# Patient Record
Sex: Female | Born: 1955 | Race: White | Hispanic: No | Smoking: Current some day smoker
Health system: Southern US, Community
[De-identification: ages and names within clinical notes are randomized; demographics above are authoritative.]

## PROBLEM LIST (undated history)

## (undated) DIAGNOSIS — C50919 Malignant neoplasm of unspecified site of unspecified female breast: Secondary | ICD-10-CM

## (undated) DIAGNOSIS — E039 Hypothyroidism, unspecified: Secondary | ICD-10-CM

## (undated) DIAGNOSIS — J449 Chronic obstructive pulmonary disease, unspecified: Secondary | ICD-10-CM

## (undated) DIAGNOSIS — E7800 Pure hypercholesterolemia, unspecified: Secondary | ICD-10-CM

## (undated) DIAGNOSIS — K219 Gastro-esophageal reflux disease without esophagitis: Secondary | ICD-10-CM

## (undated) DIAGNOSIS — Z923 Personal history of irradiation: Secondary | ICD-10-CM

## (undated) DIAGNOSIS — I10 Essential (primary) hypertension: Secondary | ICD-10-CM

## (undated) HISTORY — PX: APPENDECTOMY: SHX54

## (undated) HISTORY — PX: MASTECTOMY, PARTIAL: SHX709

## (undated) HISTORY — PX: TUBAL LIGATION: SHX77

---

## 1998-05-14 ENCOUNTER — Ambulatory Visit (HOSPITAL_COMMUNITY): Admission: RE | Admit: 1998-05-14 | Discharge: 1998-05-14 | Payer: Self-pay | Admitting: Endocrinology

## 1998-05-22 ENCOUNTER — Ambulatory Visit (HOSPITAL_COMMUNITY): Admission: RE | Admit: 1998-05-22 | Discharge: 1998-05-22 | Payer: Self-pay | Admitting: Endocrinology

## 2000-02-03 ENCOUNTER — Ambulatory Visit (HOSPITAL_COMMUNITY): Admission: RE | Admit: 2000-02-03 | Discharge: 2000-02-03 | Payer: Self-pay | Admitting: Endocrinology

## 2000-02-03 ENCOUNTER — Encounter: Payer: Self-pay | Admitting: Endocrinology

## 2000-02-17 ENCOUNTER — Encounter: Payer: Self-pay | Admitting: Endocrinology

## 2000-02-17 ENCOUNTER — Ambulatory Visit (HOSPITAL_COMMUNITY): Admission: RE | Admit: 2000-02-17 | Discharge: 2000-02-17 | Payer: Self-pay | Admitting: Endocrinology

## 2000-02-27 ENCOUNTER — Inpatient Hospital Stay (HOSPITAL_COMMUNITY): Admission: EM | Admit: 2000-02-27 | Discharge: 2000-02-29 | Payer: Self-pay | Admitting: Emergency Medicine

## 2000-02-29 ENCOUNTER — Inpatient Hospital Stay (HOSPITAL_COMMUNITY): Admission: EM | Admit: 2000-02-29 | Discharge: 2000-03-02 | Payer: Self-pay | Admitting: *Deleted

## 2000-03-07 ENCOUNTER — Inpatient Hospital Stay (HOSPITAL_COMMUNITY): Admission: EM | Admit: 2000-03-07 | Discharge: 2000-03-09 | Payer: Self-pay | Admitting: *Deleted

## 2000-05-28 ENCOUNTER — Emergency Department (HOSPITAL_COMMUNITY): Admission: EM | Admit: 2000-05-28 | Discharge: 2000-05-28 | Payer: Self-pay | Admitting: *Deleted

## 2000-11-27 ENCOUNTER — Emergency Department (HOSPITAL_COMMUNITY): Admission: EM | Admit: 2000-11-27 | Discharge: 2000-11-27 | Payer: Self-pay | Admitting: Emergency Medicine

## 2001-05-05 ENCOUNTER — Emergency Department (HOSPITAL_COMMUNITY): Admission: EM | Admit: 2001-05-05 | Discharge: 2001-05-06 | Payer: Self-pay | Admitting: *Deleted

## 2002-05-15 ENCOUNTER — Encounter (INDEPENDENT_AMBULATORY_CARE_PROVIDER_SITE_OTHER): Payer: Self-pay | Admitting: Specialist

## 2002-05-15 ENCOUNTER — Inpatient Hospital Stay (HOSPITAL_COMMUNITY): Admission: EM | Admit: 2002-05-15 | Discharge: 2002-05-16 | Payer: Self-pay

## 2002-12-05 HISTORY — PX: BREAST LUMPECTOMY: SHX2

## 2003-04-17 ENCOUNTER — Other Ambulatory Visit: Admission: RE | Admit: 2003-04-17 | Discharge: 2003-04-17 | Payer: Self-pay | Admitting: Internal Medicine

## 2003-04-22 ENCOUNTER — Encounter: Payer: Self-pay | Admitting: Endocrinology

## 2003-04-22 ENCOUNTER — Ambulatory Visit (HOSPITAL_COMMUNITY): Admission: RE | Admit: 2003-04-22 | Discharge: 2003-04-22 | Payer: Self-pay | Admitting: Endocrinology

## 2003-04-24 ENCOUNTER — Encounter: Admission: RE | Admit: 2003-04-24 | Discharge: 2003-04-24 | Payer: Self-pay | Admitting: Endocrinology

## 2003-04-24 ENCOUNTER — Encounter: Payer: Self-pay | Admitting: Endocrinology

## 2003-05-19 ENCOUNTER — Ambulatory Visit (HOSPITAL_BASED_OUTPATIENT_CLINIC_OR_DEPARTMENT_OTHER): Admission: RE | Admit: 2003-05-19 | Discharge: 2003-05-19 | Payer: Self-pay | Admitting: General Surgery

## 2003-05-19 ENCOUNTER — Encounter: Payer: Self-pay | Admitting: General Surgery

## 2003-05-19 ENCOUNTER — Encounter (INDEPENDENT_AMBULATORY_CARE_PROVIDER_SITE_OTHER): Payer: Self-pay | Admitting: Specialist

## 2003-06-12 ENCOUNTER — Ambulatory Visit: Admission: RE | Admit: 2003-06-12 | Discharge: 2003-08-05 | Payer: Self-pay | Admitting: Radiation Oncology

## 2004-01-07 ENCOUNTER — Encounter: Admission: RE | Admit: 2004-01-07 | Discharge: 2004-01-07 | Payer: Self-pay | Admitting: General Surgery

## 2004-04-01 ENCOUNTER — Ambulatory Visit: Admission: RE | Admit: 2004-04-01 | Discharge: 2004-04-01 | Payer: Self-pay | Admitting: Radiation Oncology

## 2004-05-05 ENCOUNTER — Encounter: Admission: RE | Admit: 2004-05-05 | Discharge: 2004-05-05 | Payer: Self-pay | Admitting: General Surgery

## 2004-06-16 ENCOUNTER — Encounter: Admission: RE | Admit: 2004-06-16 | Discharge: 2004-06-16 | Payer: Self-pay | Admitting: Endocrinology

## 2004-09-21 ENCOUNTER — Encounter: Admission: RE | Admit: 2004-09-21 | Discharge: 2004-09-21 | Payer: Self-pay | Admitting: Endocrinology

## 2005-01-26 ENCOUNTER — Encounter: Admission: RE | Admit: 2005-01-26 | Discharge: 2005-01-26 | Payer: Self-pay | Admitting: Endocrinology

## 2005-05-13 ENCOUNTER — Encounter: Admission: RE | Admit: 2005-05-13 | Discharge: 2005-05-13 | Payer: Self-pay | Admitting: General Surgery

## 2006-01-06 ENCOUNTER — Other Ambulatory Visit: Admission: RE | Admit: 2006-01-06 | Discharge: 2006-01-06 | Payer: Self-pay | Admitting: Internal Medicine

## 2006-05-31 ENCOUNTER — Encounter: Admission: RE | Admit: 2006-05-31 | Discharge: 2006-05-31 | Payer: Self-pay | Admitting: General Surgery

## 2006-12-22 ENCOUNTER — Emergency Department (HOSPITAL_COMMUNITY): Admission: EM | Admit: 2006-12-22 | Discharge: 2006-12-22 | Payer: Self-pay | Admitting: Emergency Medicine

## 2007-02-01 ENCOUNTER — Other Ambulatory Visit: Admission: RE | Admit: 2007-02-01 | Discharge: 2007-02-01 | Payer: Self-pay | Admitting: Endocrinology

## 2007-05-24 ENCOUNTER — Emergency Department (HOSPITAL_COMMUNITY): Admission: EM | Admit: 2007-05-24 | Discharge: 2007-05-24 | Payer: Self-pay | Admitting: Emergency Medicine

## 2007-06-13 ENCOUNTER — Encounter: Admission: RE | Admit: 2007-06-13 | Discharge: 2007-06-13 | Payer: Self-pay | Admitting: Endocrinology

## 2008-08-21 ENCOUNTER — Encounter: Admission: RE | Admit: 2008-08-21 | Discharge: 2008-08-21 | Payer: Self-pay | Admitting: Endocrinology

## 2009-01-07 ENCOUNTER — Emergency Department (HOSPITAL_COMMUNITY): Admission: EM | Admit: 2009-01-07 | Discharge: 2009-01-07 | Payer: Self-pay | Admitting: Emergency Medicine

## 2009-05-14 ENCOUNTER — Other Ambulatory Visit: Admission: RE | Admit: 2009-05-14 | Discharge: 2009-05-14 | Payer: Self-pay | Admitting: Endocrinology

## 2009-05-29 ENCOUNTER — Emergency Department (HOSPITAL_COMMUNITY): Admission: EM | Admit: 2009-05-29 | Discharge: 2009-05-29 | Payer: Self-pay | Admitting: Emergency Medicine

## 2009-08-24 ENCOUNTER — Encounter: Admission: RE | Admit: 2009-08-24 | Discharge: 2009-08-24 | Payer: Self-pay | Admitting: Endocrinology

## 2009-08-31 ENCOUNTER — Emergency Department (HOSPITAL_COMMUNITY): Admission: EM | Admit: 2009-08-31 | Discharge: 2009-08-31 | Payer: Self-pay | Admitting: Emergency Medicine

## 2010-05-20 ENCOUNTER — Emergency Department (HOSPITAL_COMMUNITY): Admission: EM | Admit: 2010-05-20 | Discharge: 2010-05-21 | Payer: Self-pay | Admitting: Emergency Medicine

## 2010-10-29 ENCOUNTER — Encounter: Admission: RE | Admit: 2010-10-29 | Discharge: 2010-10-29 | Payer: Self-pay | Admitting: Endocrinology

## 2010-12-26 ENCOUNTER — Encounter: Payer: Self-pay | Admitting: Endocrinology

## 2011-03-11 LAB — URINALYSIS, ROUTINE W REFLEX MICROSCOPIC
Glucose, UA: NEGATIVE mg/dL
Nitrite: NEGATIVE
Protein, ur: >300 mg/dL — AB
Urobilinogen, UA: 1 mg/dL (ref 0.0–1.0)

## 2011-03-11 LAB — URINE MICROSCOPIC-ADD ON

## 2011-03-22 LAB — URINE CULTURE

## 2011-03-22 LAB — URINE MICROSCOPIC-ADD ON

## 2011-03-22 LAB — URINALYSIS, ROUTINE W REFLEX MICROSCOPIC
Bilirubin Urine: NEGATIVE
Specific Gravity, Urine: 1.005 (ref 1.005–1.030)
Urobilinogen, UA: 0.2 mg/dL (ref 0.0–1.0)

## 2011-04-22 NOTE — Op Note (Signed)
[place]  Patient:    Audrey Meyer, Audrey Meyer Visit Number: [id number] MRN: [id number]          Service Type: SUR Location: [id number] Attending Physician:  Vikki Ports. Dictated by:   Catalina Lunger, M.D. Proc. Date: 05/15/02 Admit Date:  05/15/2002 Discharge Date: 05/16/2002                             Operative Report  PREOPERATIVE DIAGNOSIS:  Acute appendicitis.  POSTOPERATIVE DIAGNOSIS:  Acute appendicitis.  PROCEDURE:  Laparoscopic appendectomy.  SURGEON:  Catalina Lunger, M.D.  ANESTHESIA:  General anesthesia.  DESCRIPTION OF PROCEDURE:  The patient was taken to the operating room and placed in the supine position.  After adequate anesthesia was induced using endotracheal tube, the abdomen was prepped and draped in the usual sterile fashion.  Using a supraumbilical small vertical incision, I dissected down to the fascia.  The fascia was opened vertically and the peritoneum was entered. An 0 Vicryl pursestring was placed around the fascial defect and Hasson trocar was placed in the abdomen.  The abdomen was insufflated with carbon dioxide. Under direct visualization a 5 mm port was placed in the right upper quadrant and a blunt 12 mm port was placed in the left suprapubic region.  The cecum was mobilized.  The appendix was identified medially.  It was quite inflammed, but not perforated.  There was some exudate and some free fluid in the cul-de-sac.  All of that was irrigated out.  The mesoappendix was taken down the Harmonic scalpel and the base of the appendix was transected with the laparoscopic GIA stapling device and then removed through the umbilical port. The right lower quadrant was then copiously irrigated.  The pneumoperitoneum was released.  The supraumbilical fascial defect was closed with 0 Vicryl pursestring suture and incisions were closed with subcuticular 4-0 Monocryl. Steri-Strips and sterile  dressings were applied.  The patient tolerated the procedure well and went to PACU In good condition. Dictated by:   Catalina Lunger, M.D. Attending Physician:  Danna Hefty R. DD:  05/15/02 TD:  05/17/02 Job: [id number]

## 2011-04-22 NOTE — Op Note (Signed)
   [name], Audrey Meyer                         ACCOUNT NO.:  [id number]   MEDICAL RECORD NO.:  [id number]                   PATIENT TYPE:  AMB   LOCATION:  DSC                                  FACILITY:  [facility]   PHYSICIAN:  Rose Phi. Maple Hudson, M.D.                DATE OF BIRTH:  1956-03-07   DATE OF PROCEDURE:  05/18/2003  DATE OF DISCHARGE:                                 OPERATIVE REPORT   PREOPERATIVE DIAGNOSIS:  Intraductal carcinoma of the left breast.   POSTOPERATIVE DIAGNOSIS:  Intraductal carcinoma of the left breast.   OPERATION:  Left partial mastectomy with needle localization and specimen  mammography.   SURGEON:  Rose Phi. Maple Hudson, M.D.   ANESTHESIA:  General.   OPERATIVE PROCEDURE:  This 55 year old married female had presented on a  screening mammogram with a new area of microcalcifications in the upper  inner quadrant of the left breast.  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  She is  scheduled for wide excision.   After suitable general anesthesia was induced, the patient was placed in the  supine position with the left arm extended on the arm board.  The left  breast was prepped and draped in the usual fashion.  A curved incision in  the upper inner quadrant was then outlined using the previously-placed wire  as a guide for where the calcifications were.  I then infiltrated it with  0.25% Marcaine.  An incision was made, and a wide excision of the wire and  surrounding tissue was carried out.  Hemostasis obtained was with the  cautery.   Specimen mammography confirmed the removal of the microcalcifications.  With  good hemostasis, subcuticular closure of 4-0 Monocryl and Steri-Strips  carried out.  Dressing applied.  The patient transferred to the recovery  room in satisfactory condition, having tolerated the procedure well.                                               Rose Phi. Maple Hudson, M.D.    PRY/MEDQ  D:  05/19/2003  T:  05/19/2003  Job:  [id number]   cc:   Brooke Bonito, M.D.  [address]  [place]  [place] [address]  Fax: [phone]

## 2011-04-22 NOTE — H&P (Signed)
[place]  Patient:    BEREA, MAJKOWSKI                 MRN: [id number] Adm. Date:  [id number] Attending:  Michiel Sites Dictator:   Janae Bridgeman Eloise Harman., M.D.                         History and Physical  HISTORY OF PRESENT ILLNESS:  This 55 year old married white female, resident of  [place], is under the psychiatric care of physicians at mental health clinic, specifically Dr. Hortencia Pilar.  At some time this past evening - it is certainly not clear, she took approximately 25 (500 mg) Tylenol P.M. tablets.  She presented o the emergency room at 0500 hours and was seen immediately by the EDP, Dr. Lorre Nick.  Patient would not elaborate on reasons for her attempt at suicide which apparently is the third attempt over the past several years.  Her  husband is here with her and speaks only a little bit of English but seems quite concerned about her welfare.  FAMILY HISTORY:  Mother has hyperthyroidism and had surgery for a goiter.  No other family illnesses are elaborated.  REVIEW OF SYSTEMS:  HEENT: She denies any significant headaches, hearing or visual problems.  No history of seizure disorder.  CARDIORESPIRATORY: Denies any chest  pain, shortness of breath, chronic cough, wheezing.  She does have some heart palpitations and does take Inderal 20 mg once or twice a day as needed. GASTROINTESTINAL: No current problems with abdominal pain.  She is currently quite nauseated from ingestion of Mucomyst given by the EDP for her overdose treatment. She does have a history of bleeding ulcers taking aspirin.  GENITOURINARY: Denies any frequency, dysuria, hematuria, or kidney stones.  MUSCULOSKELETAL: She does  have leg tightness and cramps in her calves and evidently uses Cogentin 1 mg at  bedtime to help with this.  No other arthritic or joint complaints. HEMATOLOGIC: No bleeding or bruising tendencies.  ENDOCRINOLOGIC: She  does have Graves disease and is under treatment by Dr. Juleen China, her primary care physician and endocrinologist.  She has received two doses of radioactive iodine, the latest ne just in the recent weeks.  She understands that this will ablate her thyroid and she will ultimately require supplements of thyroid hormones for the rest of her  life.  NEUROPSYCHIATRIC: She does have some ill-defined psychiatric disorder presumably depression.  She is on Paxil currently at 20 mg daily.  Previously, he was on 30 mg but did confide that she had sexual side effects specifically reduced libido and orgasmia.  GYNECOLOGIC: Patient thinks she may be going through menopause as she has had heavier periods that are lasting longer.  SOCIAL HISTORY:  Lives with her husband.  She has two children but they do not reside with her.  PHYSICAL EXAMINATION:  VITALS:  On presentation to the emergency room at 0442 hours, temperature 97.6,  blood pressure 148/92 in the left arm, pulse of 78, respirations are 20, O2 saturations of 99%.  Follow-up blood pressure at 0806 hours is 147/94, pulse of 84, respirations 22.  GENERAL:  This is a small-statured, slightly built, but well-developed, well-nourished, white female who is alert.  Her speech is perhaps slightly slurred at best.  She is very passive and does not volunteer much information.  She does become nauseated and has emesis of small quantity of her activated charcoal.  HEENT:  Pupils equal,  round, reactive to light.  Extraocular motions intact. No scleral icterus.  Oral cavity is unremarkable except her tongue is black from the activated charcoal.  There is no signs of trauma about the skull.  Her hair is ut rather short.  NECK:  Her neck shows slight fullness around the thyroid area but I cannot detect any nodules at this time and there is no tenderness.  LUNGS:  Her lungs are clear.  HEART:  Heart is regular rhythm, without murmurs, gallops,  or rubs.  ABDOMEN:  Her abdomen is soft.  There is no organomegaly or masses.  RECTAL:  Deferred.  EXTREMITIES:  Extremities show no cyanosis, clubbing, or edema.  Pulses are intact. Muscle bulk is normal.  She has good range of motion of her hips, knees, and shoulders.  MENTAL STATUS:  She is sad but cooperative and does not express any overt wish o harm herself at this time.  As stated her husband who enters the examination area is very passive himself and seems quite concerned about her welfare.  LABORATORY DATA:  Her urinalysis showed yellow clear urine.  Specific gravity 1.013, pH of 6.0, negative on dip stick.  Her urine drug screen showed no detection of usual substances.  Alcohol level was less than 10 mg/dl.  Her salicylate level was less than 4 mg/dl. Her acetaminophen level was markedly elevated at 171 mcg/ml and this exceeds the 150 mcg/ml at the four-hour mark which is the best estimate we can give for her  level.  Her CBC showed a white count of 8900, hemoglobin 11.2 gm%, hematocrit 34%, MCV f 72.9, platelet count of 278,000.  Her hepatic function panel was unremarkable with SGOT only 35, SGPT of 37, alkaline phosphatase of 94, total bilirubin of 0.5.  EKG:  There is a normal sinus rhythm with a rate of 71 beats per minute.  IMPRESSION: 1. Overdose of acetaminophen with diphenhydramine (Benadryl) of approximately    25 tablets from two to four hours prior to presentation. 2. History of psychiatric disorders - type unknown. 3. Graves disease with hyperthyroidism - currently under treatment and monitoring. 4. Perimenopausal.  PLAN:  Patient will be admitted to monitor care unit on 3300.  I have already discussed her problem with the behavioral science ACT representative and they will see her today.  She will receive a sequence of Mucomyst at 140 mg/kg for the initial dose - she has received this - and then 70 mg/kg in four hours for a total of 14  doses or until deemed unnecessary based on liver toxicity results.  Her condition at this time is very good.  She will be followed by her usual physician, Dr. Juleen China, on his return on Monday. DD:  02/27/00  TD:  02/27/00 Job: [id number]

## 2011-05-25 ENCOUNTER — Other Ambulatory Visit (HOSPITAL_COMMUNITY)
Admission: RE | Admit: 2011-05-25 | Discharge: 2011-05-25 | Disposition: A | Discharge status: Home / Self Care | Payer: Medicare Other | Source: Ambulatory Visit | Attending: Endocrinology | Admitting: Endocrinology

## 2011-05-25 ENCOUNTER — Other Ambulatory Visit: Payer: Self-pay | Admitting: Endocrinology

## 2011-05-25 DIAGNOSIS — Z124 Encounter for screening for malignant neoplasm of cervix: Secondary | ICD-10-CM | POA: Insufficient documentation

## 2011-09-21 LAB — POCT RAPID STREP A: Streptococcus, Group A Screen (Direct): NEGATIVE

## 2011-12-15 ENCOUNTER — Other Ambulatory Visit: Payer: Self-pay | Admitting: Endocrinology

## 2011-12-15 DIAGNOSIS — Z1231 Encounter for screening mammogram for malignant neoplasm of breast: Secondary | ICD-10-CM

## 2012-01-30 ENCOUNTER — Ambulatory Visit: Payer: Medicare Other

## 2012-02-02 ENCOUNTER — Ambulatory Visit
Admission: RE | Admit: 2012-02-02 | Discharge: 2012-02-02 | Disposition: A | Discharge status: Home / Self Care | Payer: Medicare Other | Source: Ambulatory Visit | Attending: Endocrinology | Admitting: Endocrinology

## 2012-02-02 DIAGNOSIS — Z1231 Encounter for screening mammogram for malignant neoplasm of breast: Secondary | ICD-10-CM

## 2012-03-21 ENCOUNTER — Encounter (HOSPITAL_COMMUNITY): Payer: Self-pay | Admitting: Emergency Medicine

## 2012-03-21 ENCOUNTER — Emergency Department (HOSPITAL_COMMUNITY)
Admission: EM | Admit: 2012-03-21 | Discharge: 2012-03-22 | Disposition: A | Discharge status: Home / Self Care | Payer: Medicare Other | Attending: Emergency Medicine | Admitting: Emergency Medicine

## 2012-03-21 DIAGNOSIS — J4489 Other specified chronic obstructive pulmonary disease: Secondary | ICD-10-CM | POA: Insufficient documentation

## 2012-03-21 DIAGNOSIS — Z853 Personal history of malignant neoplasm of breast: Secondary | ICD-10-CM | POA: Insufficient documentation

## 2012-03-21 DIAGNOSIS — R197 Diarrhea, unspecified: Secondary | ICD-10-CM | POA: Insufficient documentation

## 2012-03-21 DIAGNOSIS — Z79899 Other long term (current) drug therapy: Secondary | ICD-10-CM | POA: Insufficient documentation

## 2012-03-21 DIAGNOSIS — E876 Hypokalemia: Secondary | ICD-10-CM | POA: Insufficient documentation

## 2012-03-21 DIAGNOSIS — I10 Essential (primary) hypertension: Secondary | ICD-10-CM | POA: Insufficient documentation

## 2012-03-21 DIAGNOSIS — E039 Hypothyroidism, unspecified: Secondary | ICD-10-CM | POA: Insufficient documentation

## 2012-03-21 DIAGNOSIS — J449 Chronic obstructive pulmonary disease, unspecified: Secondary | ICD-10-CM | POA: Insufficient documentation

## 2012-03-21 DIAGNOSIS — E7800 Pure hypercholesterolemia, unspecified: Secondary | ICD-10-CM | POA: Insufficient documentation

## 2012-03-21 DIAGNOSIS — K529 Noninfective gastroenteritis and colitis, unspecified: Secondary | ICD-10-CM

## 2012-03-21 HISTORY — DX: Essential (primary) hypertension: I10

## 2012-03-21 HISTORY — DX: Chronic obstructive pulmonary disease, unspecified: J44.9

## 2012-03-21 HISTORY — DX: Gastro-esophageal reflux disease without esophagitis: K21.9

## 2012-03-21 HISTORY — DX: Malignant neoplasm of unspecified site of unspecified female breast: C50.919

## 2012-03-21 HISTORY — DX: Pure hypercholesterolemia, unspecified: E78.00

## 2012-03-21 HISTORY — DX: Hypothyroidism, unspecified: E03.9

## 2012-03-21 MED ORDER — SODIUM CHLORIDE 0.9 % IV BOLUS (SEPSIS)
1000.0000 mL | Freq: Once | INTRAVENOUS | Status: AC
  Administered 2012-03-22: 1000 mL via INTRAVENOUS

## 2012-03-21 NOTE — ED Notes (Signed)
Pt states she has had diarrhea for over 5 weeks  Pt states she was seen by Dr Candelaria Stagers at [facility] were done and it all came back ok  No stool testings have been done  Pt states everytime she eats a few minutes later she has to go to the bathroom and it is like water

## 2012-03-22 LAB — CBC
HCT: 40.1 % (ref 36.0–46.0)
Hemoglobin: 14.2 g/dL (ref 12.0–15.0)
MCH: 30.4 pg (ref 26.0–34.0)
MCHC: 35.4 g/dL (ref 30.0–36.0)
MCV: 85.9 fL (ref 78.0–100.0)

## 2012-03-22 LAB — BASIC METABOLIC PANEL
BUN: 5 mg/dL — ABNORMAL LOW (ref 6–23)
Calcium: 9.1 mg/dL (ref 8.4–10.5)
Creatinine, Ser: 0.64 mg/dL (ref 0.50–1.10)
GFR calc non Af Amer: >90 mL/min (ref >90)
Glucose, Bld: 130 mg/dL — ABNORMAL HIGH (ref 70–99)

## 2012-03-22 MED ORDER — POTASSIUM CHLORIDE CRYS ER 10 MEQ PO TBCR: 20.0000 meq | EXTENDED_RELEASE_TABLET | Freq: Two times a day (BID) | ORAL | Status: DC

## 2012-03-22 MED ORDER — POTASSIUM CHLORIDE 10 MEQ/100ML IV SOLN
10.0000 meq | Freq: Once | INTRAVENOUS | Status: AC
  Administered 2012-03-22: 10 meq via INTRAVENOUS
  Filled 2012-03-22: qty 100

## 2012-03-22 MED ORDER — POTASSIUM CHLORIDE CRYS ER 10 MEQ PO TBCR: 10.0000 meq | EXTENDED_RELEASE_TABLET | Freq: Two times a day (BID) | ORAL | Status: DC

## 2012-03-22 MED ORDER — POTASSIUM CHLORIDE CRYS ER 20 MEQ PO TBCR
40.0000 meq | EXTENDED_RELEASE_TABLET | Freq: Once | ORAL | Status: AC
  Administered 2012-03-22: 40 meq via ORAL
  Filled 2012-03-22: qty 2

## 2012-03-22 NOTE — ED Provider Notes (Signed)
History     CSN: [id number]  Arrival date & time 03/21/12  2201   First MD Initiated Contact with Patient 03/22/12 0234      Chief Complaint  Patient presents with  . Diarrhea    (Consider location/radiation/quality/duration/timing/severity/associated sxs/prior treatment) HPI Comments: Patient reports she has had diarrhea for 5 weeks.  The diarrhea is profuse, brown, and liquid.  States she has 3-4 stools when she wakes up in the morning, then 3-4 every time she eats.  Has been taking kaopectate, then immodium, and now a home remedy of lemon juice, corn starch, and coke, with only mild success.  She has been to her PCP Dr Juleen China and has had normal lab tests, including thyroid tests.  Denies recent antibiotic usage.  Denies fevers, N/V, abdominal pain, urinary or vaginal symptoms.  Denies hematochezia or melena.    Patient is a 56 y.o. female presenting with diarrhea. The history is provided by the patient.  Diarrhea The primary symptoms include diarrhea. Primary symptoms do not include fever, abdominal pain, vomiting or dysuria.    Past Medical History  Diagnosis Date  . Hypothyroid   . High cholesterol   . Acid reflux   . COPD (chronic obstructive pulmonary disease)   . Hypertension   . Schizoaffective disorder   . Breast cancer     Past Surgical History  Procedure Date  . Mastectomy, partial   . Appendectomy     Family History  Problem Relation Age of Onset  . Heart attack Father   . Cancer Other     History  Substance Use Topics  . Smoking status: Current Everyday Smoker -- 1.0 packs/day    Types: Cigarettes  . Smokeless tobacco: Not on file  . Alcohol Use: No    OB History    Grav Para Term Preterm Abortions TAB SAB Ect Mult Living                  Review of Systems  Constitutional: Negative for fever.  Respiratory: Negative for cough and shortness of breath.   Cardiovascular: Negative for chest pain.  Gastrointestinal: Positive for diarrhea. Negative  for vomiting and abdominal pain.  Genitourinary: Negative for dysuria, urgency, frequency, vaginal bleeding and vaginal discharge.  Neurological: Negative for weakness and numbness.  All other systems reviewed and are negative.    Allergies  Review of patient's allergies indicates no known allergies.  Home Medications   Current Outpatient Rx  Name Route Sig Dispense Refill  . ALBUTEROL SULFATE HFA 108 (90 BASE) MCG/ACT IN AERS Inhalation Inhale 2 puffs into the lungs every 6 (six) hours as needed.    . ATENOLOL-CHLORTHALIDONE 100-25 MG PO TABS Oral Take 1 tablet by mouth daily.    Marland Kitchen BENZTROPINE MESYLATE 1 MG PO TABS Oral Take 1 mg by mouth daily.    Marland Kitchen DM-GUAIFENESIN ER 30-600 MG PO TB12 Oral Take 1 tablet by mouth every 12 (twelve) hours as needed.    Marland Kitchen DIAZEPAM 5 MG PO TABS Oral Take 5 mg by mouth every 12 (twelve) hours as needed.     Marland Kitchen HYDROCODONE-ACETAMINOPHEN 5-325 MG PO TABS Oral Take 1 tablet by mouth every 6 (six) hours as needed.    Marland Kitchen LEVOTHYROXINE SODIUM 100 MCG PO TABS Oral Take 100 mcg by mouth daily.    Marland Kitchen LIOTHYRONINE SODIUM 25 MCG PO TABS Oral Take 12.5 mcg by mouth 2 (two) times daily.     Marland Kitchen LISINOPRIL 20 MG PO TABS Oral Take 20 mg  by mouth daily.    Marland Kitchen MELATONIN 3 MG PO TABS Oral Take 1 tablet by mouth daily. Hold while in hospital    . OMEPRAZOLE 20 MG PO CPDR Oral Take 20 mg by mouth daily.    Marland Kitchen PAROXETINE HCL ER 25 MG PO TB24 Oral Take 25 mg by mouth 2 (two) times daily.    Marland Kitchen POTASSIUM CHLORIDE CRYS ER 10 MEQ PO TBCR Oral Take 10 mEq by mouth 2 (two) times daily.     Marland Kitchen PRAVASTATIN SODIUM 20 MG PO TABS Oral Take 20 mg by mouth daily.    . THIOTHIXENE 5 MG PO CAPS Oral Take 5 mg by mouth daily.    . TRAZODONE HCL 100 MG PO TABS Oral Take 200 mg by mouth at bedtime.    Marland Kitchen TRIMETHOPRIM 100 MG PO TABS Oral Take 100 mg by mouth as needed.      BP 107/65  Pulse 67  Temp(Src) 98.4 F (36.9 C) (Oral)  Resp 16  SpO2 94%  Physical Exam  Nursing note and vitals  reviewed. Constitutional: She is oriented to person, place, and time. She appears well-developed and well-nourished. No distress.  HENT:  Head: Normocephalic and atraumatic.  Neck: Neck supple.  Cardiovascular: Normal rate, regular rhythm and normal heart sounds.   Pulmonary/Chest: Breath sounds normal. No respiratory distress. She has no wheezes. She has no rales. She exhibits no tenderness.  Abdominal: Soft. Bowel sounds are normal. She exhibits no distension and no mass. There is no tenderness. There is no rebound and no guarding.  Neurological: She is alert and oriented to person, place, and time.  Skin: She is not diaphoretic.  Psychiatric: She has a normal mood and affect. Her behavior is normal. Judgment and thought content normal.    ED Course  Procedures (including critical care time)  Labs Reviewed  BASIC METABOLIC PANEL - Abnormal; Notable for the following:    Potassium 2.6 (*)    Glucose, Bld 130 (*)    BUN 5 (*)    All other components within normal limits  CBC  CLOSTRIDIUM DIFFICILE BY PCR  STOOL CULTURE   No results found.  3:22 AM Patient has not yet had bowel movement while in ED.    Discussed patient with Dr Patria Mane.   4:47 AM Patient has been in the ED for 6 hours and 45 minutes and has not yet had a bowel movement.  Patient found to by hypokalemic, replaced in ED.  Pt states she takes potassium at home, likely not keeping up with the level of diarrhea.  I will add a short course to supplement.  I have asked patient to follow closely with Dr Juleen China and to do the stool samples if she is able.    1. Hypokalemia   2. Chronic diarrhea       MDM  Patient with chronic diarrhea x 5 weeks with no fever or abdominal pain found to by hyponatremic.  Abdominal exam is benign. Patient is being followed by Dr Juleen China (PCP) for this problem.  Potassium repleted in ED and additional prescription written for home.  Pt unable to produce stool in 7 hours, despite eating and  drinking.  Pt d/c home with close PCP follow up.  Patient verbalizes understanding and agrees with plan.          Dillard Cannon [place], [place] 03/22/12 [phone]

## 2012-03-22 NOTE — Discharge Instructions (Signed)
Please call Dr Juleen China for a close follow up appointment.  Please discuss having stool samples done and a recheck of your potassium.  If you develop fevers, abdominal pain, nausea and vomiting, blood in your stool, or severe dehydration, return to the ER immediately for a recheck.  You may return to the ER at any time for worsening condition or any new symptoms that concern you.  Chronic Diarrhea Diarrhea is loose, watery stools. Having diarrhea means passing loose stools 3 or more times a day. Diarrhea that lasts longer than 4 weeks is considered long-lasting (chronic). Symptoms of chronic diarrhea may be continual or may come and go. People of all ages can get diarrhea. Body fluid loss (dehydration) may occur as a result of diarrhea. This means the body does not have as many fluids and salts (electrolytes) as it needs. CAUSES  There are many causes of chronic diarrhea. Causes may be different for children and adults. The various causes can be grouped into 2 categories: diarrhea caused by an infection and diarrhea not caused by an infection. Sometimes, the cause is unknown. Diarrhea caused by an infection may result from:  Parasites.   Bacteria.   Viral infections.  Diarrhea not caused by an infection may result from:  Irritable bowel syndrome.   Reaction to medicines, such as antibiotics, cancer drugs, blood pressure medicines, and antacids.   Intestinal disease (Crohn's disease, ulcerative colitis, celiac disease).   Food allergies or sensitivity to additives (fructose, lactose, sugar substitutes).   Tumors.   Diabetes, thyroid disease, and other endocrine diseases.   Reduced blood flow to the intestine.   Previous surgery or radiation of the abdomen or gastrointestinal tract.  Risk factors for chronic diarrhea include:  Having a severely weakened immune system, such as from HIV/AIDS.   Taking certain types of cancer-fighting drugs (chemotherapy) or other medicines.   A recent  organ transplant.   Having a portion of the stomach removed.   Traveling to countries where food and water supplies are often contaminated.  SYMPTOMS  In addition to frequent, loose stools, diarrhea may cause:  Cramping.   Abdominal pain.   Nausea.   Urgent need to use the bathroom, or loss of bowel control.  If dehydration occurs, problems include:  Thirst.   Less frequent urination.   Dark urine.   Dry skin.   Fatigue.   Dizziness.  Infections that cause diarrhea may also cause a fever, chills, or bloody stools. DIAGNOSIS  Diagnosis may be difficult. Your caregiver must take a careful history and perform a physical exam. Tests given are based on your symptoms and history. Tests may include:  Blood or stool tests, in which 3 or more stool samples may be examined. Stool cultures may be used to test for bacteria or parasites.   X-rays.   A procedure in which a thin tube is inserted into the mouth or rectum (endoscopy). This allows the caregiver to look inside the intestine.  TREATMENT   Diarrhea caused by an infection can often be treated with antibiotics.   Diarrhea not caused by an infection is more difficult to diagnose and treat. Long-term medicine use or surgery may be required. Specific treatment should be discussed with your caregiver.   If the cause cannot determined, treatment to relieve symptoms includes:   Preventing dehydration. Serious health problems can occur if you do not maintain proper fluid levels. Many oral rehydration solutions (ORS) are available at drug stores. Ask your caregiver what product is best for  you.   Not drinking beverages that contain caffeine (tea, coffee, soft drinks).   Not drinking alcohol. It causes dehydration.   Not relying on sports drinks and broths alone to maintain proper fluid levels. They should not be used to prevent severe dehydration.   Maintaining well-balanced nutrition. This may help you recover faster.    PREVENTION   Drink clean or purified water.   Use proper food handling techniques.   Maintain proper hand-washing habits.  HOME CARE INSTRUCTIONS   Avoid:   Caffeine.   Greasy foods.   High fiber.   If you have problems digesting lactose during or after an episode of diarrhea, you might want to try yogurt. Yogurt is often better tolerated, because it has less lactose than milk. Yogurt with active, live bacterial cultures may even help you recover faster.  SEEK MEDICAL CARE IF:  The person with diarrhea is an otherwise healthy adult and has:  Signs of dehydration.   Diarrhea for more than 2 days.   Severe pain in the abdomen or rectum.   An oral temperature above 102 F (38.9 C).   Stools containing blood or pus.   Stools that are black and tarry.  SEEK IMMEDIATE MEDICAL CARE IF:  The person with diarrhea is a child, elderly person, or has a weakened immune system and has:  Signs of dehydration.   Diarrhea for more than 1 day.   Severe pain in the abdomen or rectum.   An oral temperature above 102 F (38.9 C), not controlled by medicine.   Stools containing blood or pus.   Stools that are black and tarry.  Document Released: 02/11/2004 Document Revised: 11/10/2011 Document Reviewed: 04/09/2010 [facility] Patient Information 2012 [place], [place].

## 2012-03-22 NOTE — ED Provider Notes (Signed)
Medical screening examination/treatment/procedure(s) were performed by non-physician practitioner and as supervising physician I was immediately available for consultation/collaboration.   Lyanne Co, MD 03/22/12 [phone]

## 2012-03-26 LAB — STOOL CULTURE

## 2012-09-19 ENCOUNTER — Emergency Department (HOSPITAL_COMMUNITY)
Admission: EM | Admit: 2012-09-19 | Discharge: 2012-09-20 | Disposition: A | Discharge status: Home / Self Care | Payer: Medicare Other | Attending: Emergency Medicine | Admitting: Emergency Medicine

## 2012-09-19 DIAGNOSIS — R109 Unspecified abdominal pain: Secondary | ICD-10-CM | POA: Insufficient documentation

## 2012-09-19 DIAGNOSIS — E7800 Pure hypercholesterolemia, unspecified: Secondary | ICD-10-CM | POA: Insufficient documentation

## 2012-09-19 DIAGNOSIS — K219 Gastro-esophageal reflux disease without esophagitis: Secondary | ICD-10-CM | POA: Insufficient documentation

## 2012-09-19 DIAGNOSIS — Z853 Personal history of malignant neoplasm of breast: Secondary | ICD-10-CM | POA: Insufficient documentation

## 2012-09-19 DIAGNOSIS — F17200 Nicotine dependence, unspecified, uncomplicated: Secondary | ICD-10-CM | POA: Insufficient documentation

## 2012-09-19 DIAGNOSIS — I10 Essential (primary) hypertension: Secondary | ICD-10-CM | POA: Insufficient documentation

## 2012-09-19 DIAGNOSIS — J4489 Other specified chronic obstructive pulmonary disease: Secondary | ICD-10-CM | POA: Insufficient documentation

## 2012-09-19 DIAGNOSIS — F259 Schizoaffective disorder, unspecified: Secondary | ICD-10-CM | POA: Insufficient documentation

## 2012-09-19 DIAGNOSIS — E039 Hypothyroidism, unspecified: Secondary | ICD-10-CM | POA: Insufficient documentation

## 2012-09-19 DIAGNOSIS — Z888 Allergy status to other drugs, medicaments and biological substances status: Secondary | ICD-10-CM | POA: Insufficient documentation

## 2012-09-19 DIAGNOSIS — J449 Chronic obstructive pulmonary disease, unspecified: Secondary | ICD-10-CM | POA: Insufficient documentation

## 2012-09-19 LAB — URINALYSIS, ROUTINE W REFLEX MICROSCOPIC
Glucose, UA: NEGATIVE mg/dL
Ketones, ur: NEGATIVE mg/dL
Leukocytes, UA: NEGATIVE
Nitrite: NEGATIVE
Specific Gravity, Urine: 1.015 (ref 1.005–1.030)
pH: 7.5 (ref 5.0–8.0)

## 2012-09-19 NOTE — ED Notes (Signed)
Pt states she took OTC laxative which helped. Pt states her last BM was today.

## 2012-09-19 NOTE — ED Notes (Signed)
Pt c/o "abdominal bloating and trapped gas". Pt states she had pain in her lower abd earlier but it has gone since. Pt denies n/v.

## 2012-09-19 NOTE — ED Notes (Signed)
Pt was dx with colitis September. Pt was seen at [facility] today Dr. Elnoria Howard. Pt was given Align probiotic. Pt complain of severe lower ABd pain last night. Pt took Mag citrate to help with BM. Pt had BM. Pt still feels abdominal distention like trapped gas. No n/v.

## 2012-09-20 ENCOUNTER — Encounter (HOSPITAL_COMMUNITY): Payer: Self-pay | Admitting: *Deleted

## 2012-09-20 LAB — BASIC METABOLIC PANEL
BUN: 6 mg/dL (ref 6–23)
CO2: 30 mEq/L (ref 19–32)
Chloride: 96 mEq/L (ref 96–112)
Creatinine, Ser: 0.66 mg/dL (ref 0.50–1.10)
Potassium: 3.8 mEq/L (ref 3.5–5.1)

## 2012-09-20 LAB — CBC
HCT: 39.8 % (ref 36.0–46.0)
MCV: 85.8 fL (ref 78.0–100.0)
RBC: 4.64 MIL/uL (ref 3.87–5.11)
WBC: 8.1 10*3/uL (ref 4.0–10.5)

## 2012-09-20 MED ORDER — DICYCLOMINE HCL 20 MG PO TABS: 20.0000 mg | ORAL_TABLET | Freq: Two times a day (BID) | ORAL | Status: DC

## 2012-09-20 NOTE — ED Provider Notes (Signed)
History     CSN: [id number]  Arrival date & time 09/19/12  2225   First MD Initiated Contact with Patient 09/20/12 0052      Chief Complaint  Patient presents with  . Abdominal Pain    (Consider location/radiation/quality/duration/timing/severity/associated sxs/prior treatment) HPI Comments: Patient reports that two days ago she began having generalized abdominal pain.  She describes the pain as a cramping pain.  She states that she felt "gassy"  She states that she was feeling constipated and took a laxative 2 days ago.  Yesterday she began having diarrhea.  No blood in her stool.  She denies nausea or vomiting.  She reports that she continues to have some mild generalized abdominal cramping pain, but it has improved.  No fever or chills.  She saw Dr. Elnoria Howard with GI this morning.  She states that he instructed her to take Probiotic.    Patient is a 56 y.o. female presenting with abdominal pain. The history is provided by the patient.  Abdominal Pain The primary symptoms of the illness include abdominal pain and diarrhea. The primary symptoms of the illness do not include fever, nausea, vomiting, dysuria or vaginal discharge.  Additional symptoms associated with the illness include constipation. Symptoms associated with the illness do not include chills, diaphoresis, urgency or frequency.    Past Medical History  Diagnosis Date  . Hypothyroid   . High cholesterol   . Acid reflux   . COPD (chronic obstructive pulmonary disease)   . Hypertension   . Schizoaffective disorder   . Breast cancer     Past Surgical History  Procedure Date  . Mastectomy, partial   . Appendectomy     Family History  Problem Relation Age of Onset  . Heart attack Father   . Cancer Other     History  Substance Use Topics  . Smoking status: Current Every Day Smoker -- 1.0 packs/day    Types: Cigarettes  . Smokeless tobacco: Not on file  . Alcohol Use: No    OB History    Grav Para Term Preterm  Abortions TAB SAB Ect Mult Living                  Review of Systems  Constitutional: Negative for fever, chills and diaphoresis.  Gastrointestinal: Positive for abdominal pain, diarrhea and constipation. Negative for nausea, vomiting, blood in stool, abdominal distention and anal bleeding.  Genitourinary: Negative for dysuria, urgency, frequency and vaginal discharge.    Allergies  Budesonide  Home Medications   Current Outpatient Rx  Name Route Sig Dispense Refill  . RISAQUAD PO CAPS Oral Take 1 capsule by mouth daily.    . ATENOLOL-CHLORTHALIDONE 100-25 MG PO TABS Oral Take 0.5 tablets by mouth daily.     Marland Kitchen BENZTROPINE MESYLATE 1 MG PO TABS Oral Take 1 mg by mouth daily.    Marland Kitchen DM-GUAIFENESIN ER 30-600 MG PO TB12 Oral Take 1 tablet by mouth every 12 (twelve) hours as needed.    Marland Kitchen DIAZEPAM 5 MG PO TABS Oral Take 5 mg by mouth every 12 (twelve) hours as needed.     Marland Kitchen ESOMEPRAZOLE MAGNESIUM 40 MG PO CPDR Oral Take 40 mg by mouth daily before breakfast.    . HYDRALAZINE HCL 50 MG PO TABS Oral Take 25 mg by mouth 2 (two) times daily.    Marland Kitchen HYDROCODONE-ACETAMINOPHEN 5-325 MG PO TABS Oral Take 1 tablet by mouth every 6 (six) hours as needed.    Marland Kitchen LEVOTHYROXINE SODIUM 100  MCG PO TABS Oral Take 100 mcg by mouth daily.    Marland Kitchen LIOTHYRONINE SODIUM 25 MCG PO TABS Oral Take 12.5 mcg by mouth 2 (two) times daily.     Marland Kitchen LISINOPRIL 20 MG PO TABS Oral Take 20 mg by mouth daily.    Marland Kitchen MELATONIN 3 MG PO TABS Oral Take 1 tablet by mouth daily. Hold while in hospital    . PAROXETINE HCL ER 25 MG PO TB24 Oral Take 50 mg by mouth daily.     Marland Kitchen POTASSIUM CHLORIDE CRYS ER 10 MEQ PO TBCR Oral Take 10 mEq by mouth 2 (two) times daily.     Marland Kitchen PRAVASTATIN SODIUM 20 MG PO TABS Oral Take 20 mg by mouth daily.    . THIOTHIXENE 5 MG PO CAPS Oral Take 5 mg by mouth daily.    . TRAZODONE HCL 100 MG PO TABS Oral Take 200 mg by mouth at bedtime.    . ALBUTEROL SULFATE HFA 108 (90 BASE) MCG/ACT IN AERS Inhalation Inhale 2  puffs into the lungs every 6 (six) hours as needed.    Marland Kitchen TRIMETHOPRIM 100 MG PO TABS Oral Take 100 mg by mouth as needed.      BP 134/78  Pulse 69  Temp 98.1 F (36.7 C) (Oral)  Resp 18  Ht 5\' 2"  (1.575 m)  Wt 135 lb (61.236 kg)  BMI 24.69 kg/m2  SpO2 97%  Physical Exam  Nursing note and vitals reviewed. Constitutional: She appears well-developed and well-nourished.  HENT:  Head: Normocephalic and atraumatic.  Mouth/Throat: Oropharynx is clear and moist.  Cardiovascular: Normal rate, regular rhythm and normal heart sounds.   Pulmonary/Chest: Effort normal and breath sounds normal. No respiratory distress. She has no wheezes. She has no rales.  Abdominal: Soft. Bowel sounds are normal. She exhibits no distension and no mass. There is no tenderness. There is no rebound and no guarding.  Neurological: She is alert.  Skin: Skin is warm and dry.  Psychiatric: She has a normal mood and affect.    ED Course  Procedures (including critical care time)  Labs Reviewed  CBC - Abnormal; Notable for the following:    MCHC 36.2 (*)     All other components within normal limits  BASIC METABOLIC PANEL - Abnormal; Notable for the following:    Sodium 133 (*)     Glucose, Bld 100 (*)     All other components within normal limits  URINALYSIS, ROUTINE W REFLEX MICROSCOPIC - Abnormal; Notable for the following:    APPearance CLOUDY (*)     All other components within normal limits   No results found.   No diagnosis found.    MDM  Patient presenting today with a chief complaint of cramping abdominal pain over the past 2-3 days.  No abdominal tenderness on exam.  Patient is afebrile.  No vomiting.  She has had two bowel movements today.  She saw Dr. Elnoria Howard with GI earlier today and was discharged home with Probiotic.  Labs unremarkable.  Therefore, do not feel that further work up is indicated.  Feel that patient can be discharged home.  Return precautions discussed.        Pascal Lux [place], PA-C 09/20/12 [phone]

## 2012-09-20 NOTE — ED Notes (Signed)
Patient given discharge instructions, information, prescriptions, and diet order. Patient states that they adequately understand discharge information given and to return to ED if symptoms return or worsen.     

## 2012-09-21 NOTE — ED Provider Notes (Signed)
Medical screening examination/treatment/procedure(s) were performed by non-physician practitioner and as supervising physician I was immediately available for consultation/collaboration.   Celene Kras, MD 09/21/12 [phone]

## 2012-10-15 ENCOUNTER — Emergency Department (HOSPITAL_COMMUNITY): Payer: Medicare Other

## 2012-10-15 ENCOUNTER — Encounter (HOSPITAL_COMMUNITY): Payer: Self-pay | Admitting: *Deleted

## 2012-10-15 ENCOUNTER — Emergency Department (HOSPITAL_COMMUNITY)
Admission: EM | Admit: 2012-10-15 | Discharge: 2012-10-15 | Disposition: A | Discharge status: Home / Self Care | Payer: Medicare Other | Attending: Emergency Medicine | Admitting: Emergency Medicine

## 2012-10-15 DIAGNOSIS — F259 Schizoaffective disorder, unspecified: Secondary | ICD-10-CM | POA: Insufficient documentation

## 2012-10-15 DIAGNOSIS — E7800 Pure hypercholesterolemia, unspecified: Secondary | ICD-10-CM | POA: Insufficient documentation

## 2012-10-15 DIAGNOSIS — J449 Chronic obstructive pulmonary disease, unspecified: Secondary | ICD-10-CM | POA: Insufficient documentation

## 2012-10-15 DIAGNOSIS — K219 Gastro-esophageal reflux disease without esophagitis: Secondary | ICD-10-CM | POA: Insufficient documentation

## 2012-10-15 DIAGNOSIS — J4489 Other specified chronic obstructive pulmonary disease: Secondary | ICD-10-CM | POA: Insufficient documentation

## 2012-10-15 DIAGNOSIS — E039 Hypothyroidism, unspecified: Secondary | ICD-10-CM | POA: Insufficient documentation

## 2012-10-15 DIAGNOSIS — R1310 Dysphagia, unspecified: Secondary | ICD-10-CM | POA: Insufficient documentation

## 2012-10-15 DIAGNOSIS — F17200 Nicotine dependence, unspecified, uncomplicated: Secondary | ICD-10-CM | POA: Insufficient documentation

## 2012-10-15 DIAGNOSIS — Z853 Personal history of malignant neoplasm of breast: Secondary | ICD-10-CM | POA: Insufficient documentation

## 2012-10-15 DIAGNOSIS — I10 Essential (primary) hypertension: Secondary | ICD-10-CM | POA: Insufficient documentation

## 2012-10-15 NOTE — ED Notes (Signed)
Pt c/o ongoing problem w/ difficulty swallowing, worsening this past year. Pt states dysphagia worse when lying down and it was irritating her more than usual.

## 2012-10-15 NOTE — ED Notes (Addendum)
Pt states that she is having trouble swallowing. Water given. No coughing or choking noted. Coke given through a straw. No coughing or choking noted. Pt stated that she is doing fine with liquids. Cracker given. No coughing or choking noted.

## 2012-10-15 NOTE — ED Provider Notes (Signed)
History     CSN: [id number]  Arrival date & time 10/15/12  7829   First MD Initiated Contact with Patient 10/15/12 0455      Chief Complaint  Patient presents with  . Dysphagia    (Consider location/radiation/quality/duration/timing/severity/associated sxs/prior treatment) HPI Hx per PT, trouble swallowing solids and pills. This has been going on for some time but seems like it is getting worse, especially with swallowing her potassium pills, no trouble swallowing liquids, has seen Dr Elnoria Howard in the past with endoscopy but has never required dilation. No CP or SOB, no recent illness. No F/C. Moderate in severity. No known alleviating factors, is prescribed nexium for reflux.   Past Medical History  Diagnosis Date  . Hypothyroid   . High cholesterol   . Acid reflux   . COPD (chronic obstructive pulmonary disease)   . Hypertension   . Schizoaffective disorder   . Breast cancer     Past Surgical History  Procedure Date  . Mastectomy, partial   . Appendectomy     Family History  Problem Relation Age of Onset  . Heart attack Father   . Cancer Other     History  Substance Use Topics  . Smoking status: Current Every Day Smoker -- 1.0 packs/day    Types: Cigarettes  . Smokeless tobacco: Not on file  . Alcohol Use: No    OB History    Grav Para Term Preterm Abortions TAB SAB Ect Mult Living                  Review of Systems  Constitutional: Negative for fever and chills.  HENT: Negative for neck pain and neck stiffness.   Eyes: Negative for pain.  Respiratory: Negative for shortness of breath.   Cardiovascular: Negative for chest pain.  Gastrointestinal: Negative for abdominal pain.  Genitourinary: Negative for dysuria.  Musculoskeletal: Negative for back pain.  Skin: Negative for rash.  Neurological: Negative for headaches.  All other systems reviewed and are negative.    Allergies  Budesonide  Home Medications   Current Outpatient Rx  Name  Route   Sig  Dispense  Refill  . RISAQUAD PO CAPS   Oral   Take 1 capsule by mouth daily.         . ALBUTEROL SULFATE HFA 108 (90 BASE) MCG/ACT IN AERS   Inhalation   Inhale 2 puffs into the lungs every 6 (six) hours as needed. For shortness of breath         . ATENOLOL-CHLORTHALIDONE 100-25 MG PO TABS   Oral   Take 0.5 tablets by mouth 2 (two) times daily.          Marland Kitchen BENZTROPINE MESYLATE 1 MG PO TABS   Oral   Take 1 mg by mouth daily.         Marland Kitchen DM-GUAIFENESIN ER 30-600 MG PO TB12   Oral   Take 1 tablet by mouth every 12 (twelve) hours as needed. For cough & congestion         . DIAZEPAM 5 MG PO TABS   Oral   Take 5 mg by mouth every 12 (twelve) hours as needed. For anxiety         . DICYCLOMINE HCL 20 MG PO TABS   Oral   Take 1 tablet (20 mg total) by mouth 2 (two) times daily.   20 tablet   0   . ESOMEPRAZOLE MAGNESIUM 40 MG PO CPDR   Oral   Take  40 mg by mouth daily before breakfast.         . HYDRALAZINE HCL 50 MG PO TABS   Oral   Take 25 mg by mouth 2 (two) times daily.         Marland Kitchen HYDROCODONE-ACETAMINOPHEN 5-325 MG PO TABS   Oral   Take 1 tablet by mouth every 6 (six) hours as needed. For pain         . LEVOTHYROXINE SODIUM 100 MCG PO TABS   Oral   Take 100 mcg by mouth daily.         Marland Kitchen LIOTHYRONINE SODIUM 25 MCG PO TABS   Oral   Take 12.5 mcg by mouth 2 (two) times daily.          Marland Kitchen LISINOPRIL 20 MG PO TABS   Oral   Take 20 mg by mouth daily.         Marland Kitchen MELATONIN 3 MG PO TABS   Oral   Take 1 tablet by mouth daily as needed. For sleep         . PAROXETINE HCL ER 25 MG PO TB24   Oral   Take 50 mg by mouth daily.          Marland Kitchen POTASSIUM CHLORIDE CRYS ER 10 MEQ PO TBCR   Oral   Take 10 mEq by mouth 2 (two) times daily.          Marland Kitchen PRAVASTATIN SODIUM 20 MG PO TABS   Oral   Take 20 mg by mouth daily.         . THIOTHIXENE 5 MG PO CAPS   Oral   Take 5 mg by mouth daily.         . TRAZODONE HCL 100 MG PO TABS   Oral   Take  200 mg by mouth at bedtime. scheduled         . TRIMETHOPRIM 100 MG PO TABS   Oral   Take 100 mg by mouth daily as needed. For urinary tract infections. Patient states that she takes one tablet 30 minutes prior to relations.           BP 135/73  Pulse 69  Temp 98.3 F (36.8 C) (Oral)  Resp 19  Ht 5\' 2"  (1.575 m)  Wt 128 lb (58.06 kg)  BMI 23.41 kg/m2  SpO2 95%  Physical Exam  Nursing note and vitals reviewed. Constitutional: She is oriented to person, place, and time. She appears well-developed and well-nourished.  HENT:  Head: Normocephalic and atraumatic.  Eyes: EOM are normal. Pupils are equal, round, and reactive to light.  Neck: Neck supple.  Cardiovascular: Normal rate, normal heart sounds and intact distal pulses.   Pulmonary/Chest: Effort normal and breath sounds normal. No stridor. No respiratory distress. She has no wheezes.  Abdominal: Soft. Bowel sounds are normal. She exhibits no distension. There is no tenderness.  Musculoskeletal: Normal range of motion. She exhibits no edema.  Lymphadenopathy:    She has no cervical adenopathy.  Neurological: She is alert and oriented to person, place, and time.  Skin: Skin is warm and dry.    ED Course  Procedures (including critical care time)  Results for orders placed during the hospital encounter of 09/19/12  CBC      Component Value Range   WBC 8.1  4.0 - 10.5 K/uL   RBC 4.64  3.87 - 5.11 MIL/uL   Hemoglobin 14.4  12.0 - 15.0 g/dL   HCT 16.1  09.6 -  46.0 %   MCV 85.8  78.0 - 100.0 fL   MCH 31.0  26.0 - 34.0 pg   MCHC 36.2 (*) 30.0 - 36.0 g/dL   RDW 16.1  09.6 - 04.5 %   Platelets 215  150 - 400 K/uL  BASIC METABOLIC PANEL      Component Value Range   Sodium 133 (*) 135 - 145 mEq/L   Potassium 3.8  3.5 - 5.1 mEq/L   Chloride 96  96 - 112 mEq/L   CO2 30  19 - 32 mEq/L   Glucose, Bld 100 (*) 70 - 99 mg/dL   BUN 6  6 - 23 mg/dL   Creatinine, Ser 4.09  0.50 - 1.10 mg/dL   Calcium 9.5  8.4 - 81.1 mg/dL    GFR calc non Af Amer >90  >90 mL/min   GFR calc Af Amer >90  >90 mL/min  URINALYSIS, ROUTINE W REFLEX MICROSCOPIC      Component Value Range   Color, Urine YELLOW  YELLOW   APPearance CLOUDY (*) CLEAR   Specific Gravity, Urine 1.015  1.005 - 1.030   pH 7.5  5.0 - 8.0   Glucose, UA NEGATIVE  NEGATIVE mg/dL   Hgb urine dipstick NEGATIVE  NEGATIVE   Bilirubin Urine NEGATIVE  NEGATIVE   Ketones, ur NEGATIVE  NEGATIVE mg/dL   Protein, ur NEGATIVE  NEGATIVE mg/dL   Urobilinogen, UA 0.2  0.0 - 1.0 mg/dL   Nitrite NEGATIVE  NEGATIVE   Leukocytes, UA NEGATIVE  NEGATIVE   Dg Chest 2 View  10/15/2012  *RADIOLOGY REPORT*  Clinical Data: Difficulty swallowing.  CHEST - 2 VIEW  Comparison: 05/29/2012  Findings: Mild hyperinflation. Heart and mediastinal contours are within normal limits.  No focal opacities or effusions.  No acute bony abnormality.  IMPRESSION: No active cardiopulmonary disease.   Original Report Authenticated By: Charlett Nose, M.D.     Tolerates PO fluids  5:10 AM d/w [place] [facility] on call for DR Elnoria Howard this weekend, DR Russella Dar recommends that if she can tolerate liquids, that she can be discharged home, stay on liquid diet and call GI office today for close follow up in clinic today.  MDM  Dysphagia able to tolerate liquids. GI consult. VS and Nursing notes reviewed. Plan close outpatient follow up.        Sunnie Nielsen, MD 10/15/12 [phone]

## 2013-03-05 ENCOUNTER — Other Ambulatory Visit: Payer: Self-pay

## 2013-03-05 DIAGNOSIS — Z1231 Encounter for screening mammogram for malignant neoplasm of breast: Secondary | ICD-10-CM

## 2013-04-10 ENCOUNTER — Inpatient Hospital Stay: Admission: RE | Admit: 2013-04-10 | Payer: Medicare Other | Source: Ambulatory Visit

## 2013-05-15 ENCOUNTER — Ambulatory Visit: Payer: Medicare Other

## 2013-05-15 ENCOUNTER — Ambulatory Visit
Admission: RE | Admit: 2013-05-15 | Discharge: 2013-05-15 | Disposition: A | Discharge status: Home / Self Care | Payer: Medicare Other | Source: Ambulatory Visit

## 2013-05-15 DIAGNOSIS — Z1231 Encounter for screening mammogram for malignant neoplasm of breast: Secondary | ICD-10-CM

## 2013-10-12 ENCOUNTER — Emergency Department (HOSPITAL_COMMUNITY)
Admission: EM | Admit: 2013-10-12 | Discharge: 2013-10-12 | Disposition: A | Discharge status: Home / Self Care | Payer: Medicare Other | Attending: Emergency Medicine | Admitting: Emergency Medicine

## 2013-10-12 ENCOUNTER — Encounter (HOSPITAL_COMMUNITY): Payer: Self-pay | Admitting: Emergency Medicine

## 2013-10-12 DIAGNOSIS — E7800 Pure hypercholesterolemia, unspecified: Secondary | ICD-10-CM | POA: Insufficient documentation

## 2013-10-12 DIAGNOSIS — Z760 Encounter for issue of repeat prescription: Secondary | ICD-10-CM | POA: Insufficient documentation

## 2013-10-12 DIAGNOSIS — E039 Hypothyroidism, unspecified: Secondary | ICD-10-CM | POA: Insufficient documentation

## 2013-10-12 DIAGNOSIS — J4489 Other specified chronic obstructive pulmonary disease: Secondary | ICD-10-CM | POA: Insufficient documentation

## 2013-10-12 DIAGNOSIS — Z79899 Other long term (current) drug therapy: Secondary | ICD-10-CM | POA: Insufficient documentation

## 2013-10-12 DIAGNOSIS — F259 Schizoaffective disorder, unspecified: Secondary | ICD-10-CM | POA: Insufficient documentation

## 2013-10-12 DIAGNOSIS — F17200 Nicotine dependence, unspecified, uncomplicated: Secondary | ICD-10-CM | POA: Insufficient documentation

## 2013-10-12 DIAGNOSIS — I10 Essential (primary) hypertension: Secondary | ICD-10-CM | POA: Insufficient documentation

## 2013-10-12 DIAGNOSIS — Z853 Personal history of malignant neoplasm of breast: Secondary | ICD-10-CM | POA: Insufficient documentation

## 2013-10-12 DIAGNOSIS — K219 Gastro-esophageal reflux disease without esophagitis: Secondary | ICD-10-CM | POA: Insufficient documentation

## 2013-10-12 DIAGNOSIS — J449 Chronic obstructive pulmonary disease, unspecified: Secondary | ICD-10-CM | POA: Insufficient documentation

## 2013-10-12 MED ORDER — TRAZODONE HCL 100 MG PO TABS: 200.0000 mg | ORAL_TABLET | Freq: Every day | ORAL | Status: DC

## 2013-10-12 NOTE — ED Provider Notes (Signed)
CSN: [id number]     Arrival date & time 10/12/13  0143 History   First MD Initiated Contact with Patient 10/12/13 0355     Chief Complaint  Patient presents with  . Medication Refill   HPI  History provided by the patient. Patient is a 57 year old female with history of schizoaffective disorder, hypertension, hypothyroidism and COPD who presents with request for medication refill. Patient reports that she is currently being followed and treated for her schizoaffective disorder [facility]. She recently had a physician change in her last prescription for trazodone was filled on October 1. She has since run out of her medication and normally takes this at night. She has called [facility] but was told she cannot have a new prescription until her new appointment with a new physician on the 18th. She is requesting a temporary prescription for trazodone until that time. She has no other complaints. Denies any 6 depression, SI or HI. Denies any hallucinations.     Past Medical History  Diagnosis Date  . Hypothyroid   . High cholesterol   . Acid reflux   . COPD (chronic obstructive pulmonary disease)   . Hypertension   . Schizoaffective disorder   . Breast cancer    Past Surgical History  Procedure Laterality Date  . Mastectomy, partial    . Appendectomy     Family History  Problem Relation Age of Onset  . Heart attack Father   . Cancer Other    History  Substance Use Topics  . Smoking status: Current Every Day Smoker -- 1.00 packs/day    Types: Cigarettes  . Smokeless tobacco: Not on file  . Alcohol Use: No   OB History   Grav Para Term Preterm Abortions TAB SAB Ect Mult Living                 Review of Systems  All other systems reviewed and are negative.    Allergies  Budesonide  Home Medications   Current Outpatient Rx  Name  Route  Sig  Dispense  Refill  . albuterol (PROVENTIL HFA;VENTOLIN HFA) 108 (90 BASE) MCG/ACT inhaler   Inhalation   Inhale 2 puffs into the lungs  every 6 (six) hours as needed. For shortness of breath         . atenolol-chlorthalidone (TENORETIC) 100-25 MG per tablet   Oral   Take 0.5 tablets by mouth 2 (two) times daily.          . benztropine (COGENTIN) 0.5 MG tablet   Oral   Take 0.5 mg by mouth daily.         Marland Kitchen dextromethorphan-guaiFENesin (MUCINEX DM) 30-600 MG per 12 hr tablet   Oral   Take 1 tablet by mouth every 12 (twelve) hours as needed. For cough & congestion         . diazepam (VALIUM) 5 MG tablet   Oral   Take 5 mg by mouth every 12 (twelve) hours as needed. For anxiety         . esomeprazole (NEXIUM) 40 MG capsule   Oral   Take 40 mg by mouth daily before breakfast.         . HYDROcodone-acetaminophen (NORCO) 5-325 MG per tablet   Oral   Take 1 tablet by mouth every 6 (six) hours as needed. For pain         . levothyroxine (SYNTHROID, LEVOTHROID) 100 MCG tablet   Oral   Take 100 mcg by mouth daily.         Marland Kitchen  liothyronine (CYTOMEL) 25 MCG tablet   Oral   Take 12.5 mcg by mouth 2 (two) times daily.          Marland Kitchen lisinopril (PRINIVIL,ZESTRIL) 20 MG tablet   Oral   Take 20 mg by mouth daily.         . Melatonin 3 MG TABS   Oral   Take 1 tablet by mouth daily as needed. For sleep         . PARoxetine (PAXIL-CR) 25 MG 24 hr tablet   Oral   Take 50 mg by mouth daily.          . potassium chloride (K-DUR,KLOR-CON) 10 MEQ tablet   Oral   Take 10 mEq by mouth 2 (two) times daily.          . pravastatin (PRAVACHOL) 20 MG tablet   Oral   Take 20 mg by mouth daily.         Marland Kitchen thiothixene (NAVANE) 2 MG capsule   Oral   Take 2 mg by mouth as needed (takes with navane 5mg  if needed).         . thiothixene (NAVANE) 5 MG capsule   Oral   Take 5 mg by mouth daily.         . traZODone (DESYREL) 100 MG tablet   Oral   Take 200 mg by mouth at bedtime. scheduled         . traZODone (DESYREL) 100 MG tablet   Oral   Take 2 tablets (200 mg total) by mouth at bedtime.   24  tablet   0    BP 152/74  Pulse 62  Temp(Src) 98.1 F (36.7 C) (Oral)  Resp 16  SpO2 100% Physical Exam  Nursing note and vitals reviewed. Constitutional: She is oriented to person, place, and time. She appears well-developed and well-nourished. No distress.  HENT:  Head: Normocephalic and atraumatic.  Eyes: Conjunctivae and EOM are normal.  Cardiovascular: Normal rate and regular rhythm.   Pulmonary/Chest: Effort normal and breath sounds normal. No respiratory distress. She has no wheezes. She has no rales.  Musculoskeletal: Normal range of motion.  Neurological: She is alert and oriented to person, place, and time.  Skin: Skin is warm and dry. No rash noted.  Psychiatric: She has a normal mood and affect. Her behavior is normal.    ED Course  Procedures   Patient appears well no acute distress. She has requests for prescription refill. No other medical complaints. Vital signs unremarkable. Have agreed to give a temporary prescription for trazodone to last until 18th at her next appointment. Patient has her regular medications and is taking them regularly.   MDM   1. Medication refill        Angus Seller, PA-C 10/12/13 1957

## 2013-10-12 NOTE — ED Notes (Signed)
Pt arrived to Ed with a need for a medication refill.  Pt seen by [facility] for mental [facility] and has a prescription for trazodone.  Pt has been unable to refill prescription and [facility] is not able to give her a prescription this evening.

## 2013-10-13 NOTE — ED Provider Notes (Signed)
Medical screening examination/treatment/procedure(s) were performed by non-physician practitioner and as supervising physician I was immediately available for consultation/collaboration.   Khamille Beynon, MD 10/13/13 0705 

## 2014-10-10 ENCOUNTER — Other Ambulatory Visit: Payer: Self-pay

## 2014-10-10 DIAGNOSIS — Z1231 Encounter for screening mammogram for malignant neoplasm of breast: Secondary | ICD-10-CM

## 2014-10-29 ENCOUNTER — Ambulatory Visit
Admission: RE | Admit: 2014-10-29 | Discharge: 2014-10-29 | Disposition: A | Discharge status: Home / Self Care | Payer: Medicare Other | Source: Ambulatory Visit

## 2014-10-29 DIAGNOSIS — Z1231 Encounter for screening mammogram for malignant neoplasm of breast: Secondary | ICD-10-CM

## 2015-09-25 ENCOUNTER — Other Ambulatory Visit: Payer: Self-pay

## 2015-09-25 DIAGNOSIS — Z1231 Encounter for screening mammogram for malignant neoplasm of breast: Secondary | ICD-10-CM

## 2015-11-04 ENCOUNTER — Ambulatory Visit
Admission: RE | Admit: 2015-11-04 | Discharge: 2015-11-04 | Disposition: A | Discharge status: Home / Self Care | Payer: Medicare Other | Source: Ambulatory Visit

## 2015-11-04 DIAGNOSIS — Z1231 Encounter for screening mammogram for malignant neoplasm of breast: Secondary | ICD-10-CM

## 2016-03-06 ENCOUNTER — Emergency Department (HOSPITAL_COMMUNITY): Payer: Medicare Other

## 2016-03-06 ENCOUNTER — Encounter (HOSPITAL_COMMUNITY): Payer: Self-pay | Admitting: Emergency Medicine

## 2016-03-06 ENCOUNTER — Encounter (HOSPITAL_COMMUNITY): Payer: Self-pay | Admitting: *Deleted

## 2016-03-06 ENCOUNTER — Inpatient Hospital Stay (HOSPITAL_COMMUNITY)
Admission: EM | Admit: 2016-03-06 | Discharge: 2016-03-14 | DRG: 917 | Disposition: A | Discharge status: Home / Self Care | Payer: Medicare Other | Attending: Internal Medicine | Admitting: Internal Medicine

## 2016-03-06 ENCOUNTER — Emergency Department (HOSPITAL_COMMUNITY)
Admission: EM | Admit: 2016-03-06 | Discharge: 2016-03-06 | Discharge status: Against Medical Advice | Payer: Medicare Other | Source: Home / Self Care | Attending: Emergency Medicine | Admitting: Emergency Medicine

## 2016-03-06 DIAGNOSIS — E7800 Pure hypercholesterolemia, unspecified: Secondary | ICD-10-CM

## 2016-03-06 DIAGNOSIS — Z79899 Other long term (current) drug therapy: Secondary | ICD-10-CM | POA: Diagnosis not present

## 2016-03-06 DIAGNOSIS — E039 Hypothyroidism, unspecified: Secondary | ICD-10-CM | POA: Insufficient documentation

## 2016-03-06 DIAGNOSIS — J440 Chronic obstructive pulmonary disease with acute lower respiratory infection: Secondary | ICD-10-CM | POA: Diagnosis present

## 2016-03-06 DIAGNOSIS — Z853 Personal history of malignant neoplasm of breast: Secondary | ICD-10-CM

## 2016-03-06 DIAGNOSIS — F258 Other schizoaffective disorders: Secondary | ICD-10-CM | POA: Diagnosis present

## 2016-03-06 DIAGNOSIS — Z8659 Personal history of other mental and behavioral disorders: Secondary | ICD-10-CM | POA: Insufficient documentation

## 2016-03-06 DIAGNOSIS — F251 Schizoaffective disorder, depressive type: Secondary | ICD-10-CM | POA: Diagnosis present

## 2016-03-06 DIAGNOSIS — R0902 Hypoxemia: Secondary | ICD-10-CM | POA: Diagnosis present

## 2016-03-06 DIAGNOSIS — Z23 Encounter for immunization: Secondary | ICD-10-CM

## 2016-03-06 DIAGNOSIS — Z638 Other specified problems related to primary support group: Secondary | ICD-10-CM | POA: Diagnosis not present

## 2016-03-06 DIAGNOSIS — T50901A Poisoning by unspecified drugs, medicaments and biological substances, accidental (unintentional), initial encounter: Secondary | ICD-10-CM | POA: Diagnosis present

## 2016-03-06 DIAGNOSIS — K529 Noninfective gastroenteritis and colitis, unspecified: Secondary | ICD-10-CM | POA: Diagnosis present

## 2016-03-06 DIAGNOSIS — Z915 Personal history of self-harm: Secondary | ICD-10-CM

## 2016-03-06 DIAGNOSIS — J449 Chronic obstructive pulmonary disease, unspecified: Secondary | ICD-10-CM | POA: Insufficient documentation

## 2016-03-06 DIAGNOSIS — J441 Chronic obstructive pulmonary disease with (acute) exacerbation: Secondary | ICD-10-CM | POA: Diagnosis present

## 2016-03-06 DIAGNOSIS — T43212A Poisoning by selective serotonin and norepinephrine reuptake inhibitors, intentional self-harm, initial encounter: Principal | ICD-10-CM | POA: Diagnosis present

## 2016-03-06 DIAGNOSIS — F419 Anxiety disorder, unspecified: Secondary | ICD-10-CM | POA: Diagnosis present

## 2016-03-06 DIAGNOSIS — F338 Other recurrent depressive disorders: Secondary | ICD-10-CM | POA: Diagnosis present

## 2016-03-06 DIAGNOSIS — R197 Diarrhea, unspecified: Secondary | ICD-10-CM

## 2016-03-06 DIAGNOSIS — Z888 Allergy status to other drugs, medicaments and biological substances status: Secondary | ICD-10-CM

## 2016-03-06 DIAGNOSIS — G4700 Insomnia, unspecified: Secondary | ICD-10-CM | POA: Diagnosis present

## 2016-03-06 DIAGNOSIS — F17210 Nicotine dependence, cigarettes, uncomplicated: Secondary | ICD-10-CM

## 2016-03-06 DIAGNOSIS — T43212S Poisoning by selective serotonin and norepinephrine reuptake inhibitors, intentional self-harm, sequela: Secondary | ICD-10-CM | POA: Diagnosis not present

## 2016-03-06 DIAGNOSIS — K219 Gastro-esophageal reflux disease without esophagitis: Secondary | ICD-10-CM | POA: Insufficient documentation

## 2016-03-06 DIAGNOSIS — I10 Essential (primary) hypertension: Secondary | ICD-10-CM | POA: Diagnosis present

## 2016-03-06 DIAGNOSIS — E785 Hyperlipidemia, unspecified: Secondary | ICD-10-CM | POA: Diagnosis present

## 2016-03-06 DIAGNOSIS — R45851 Suicidal ideations: Secondary | ICD-10-CM | POA: Diagnosis present

## 2016-03-06 DIAGNOSIS — K52839 Microscopic colitis, unspecified: Secondary | ICD-10-CM | POA: Diagnosis present

## 2016-03-06 DIAGNOSIS — R05 Cough: Secondary | ICD-10-CM

## 2016-03-06 DIAGNOSIS — T50902A Poisoning by unspecified drugs, medicaments and biological substances, intentional self-harm, initial encounter: Secondary | ICD-10-CM

## 2016-03-06 DIAGNOSIS — M79606 Pain in leg, unspecified: Secondary | ICD-10-CM | POA: Diagnosis present

## 2016-03-06 DIAGNOSIS — F29 Unspecified psychosis not due to a substance or known physiological condition: Secondary | ICD-10-CM | POA: Diagnosis present

## 2016-03-06 DIAGNOSIS — E876 Hypokalemia: Secondary | ICD-10-CM | POA: Diagnosis present

## 2016-03-06 DIAGNOSIS — J189 Pneumonia, unspecified organism: Secondary | ICD-10-CM | POA: Diagnosis present

## 2016-03-06 DIAGNOSIS — G8929 Other chronic pain: Secondary | ICD-10-CM | POA: Diagnosis present

## 2016-03-06 DIAGNOSIS — Z8249 Family history of ischemic heart disease and other diseases of the circulatory system: Secondary | ICD-10-CM

## 2016-03-06 DIAGNOSIS — T1491 Suicide attempt: Secondary | ICD-10-CM | POA: Diagnosis not present

## 2016-03-06 DIAGNOSIS — Z720 Tobacco use: Secondary | ICD-10-CM | POA: Diagnosis present

## 2016-03-06 LAB — CBC WITH DIFFERENTIAL/PLATELET
BASOS ABS: 0 10*3/uL (ref 0.0–0.1)
BASOS PCT: 0 %
Basophils Absolute: 0 10*3/uL (ref 0.0–0.1)
Basophils Relative: 0 %
EOS ABS: 0 10*3/uL (ref 0.0–0.7)
EOS ABS: 0 10*3/uL (ref 0.0–0.7)
Eosinophils Relative: 0 %
Eosinophils Relative: 0 %
HEMATOCRIT: 41.3 % (ref 36.0–46.0)
HEMATOCRIT: 37.8 % (ref 36.0–46.0)
HEMOGLOBIN: 14.9 g/dL (ref 12.0–15.0)
HEMOGLOBIN: 13.6 g/dL (ref 12.0–15.0)
LYMPHS ABS: 0.5 10*3/uL — AB (ref 0.7–4.0)
LYMPHS PCT: 15 %
Lymphocytes Relative: 28 %
Lymphs Abs: 1.2 10*3/uL (ref 0.7–4.0)
MCH: 30.9 pg (ref 26.0–34.0)
MCH: 30.1 pg (ref 26.0–34.0)
MCHC: 36.1 g/dL — AB (ref 30.0–36.0)
MCHC: 36 g/dL (ref 30.0–36.0)
MCV: 85.7 fL (ref 78.0–100.0)
MCV: 83.6 fL (ref 78.0–100.0)
MONOS PCT: 14 %
Monocytes Absolute: 0.8 10*3/uL (ref 0.1–1.0)
Monocytes Absolute: 0.5 10*3/uL (ref 0.1–1.0)
Monocytes Relative: 19 %
NEUTROS ABS: 2.3 10*3/uL (ref 1.7–7.7)
NEUTROS ABS: 2.4 10*3/uL (ref 1.7–7.7)
NEUTROS PCT: 53 %
Neutrophils Relative %: 71 %
Platelets: 216 10*3/uL (ref 150–400)
Platelets: 174 10*3/uL (ref 150–400)
RBC: 4.82 MIL/uL (ref 3.87–5.11)
RBC: 4.52 MIL/uL (ref 3.87–5.11)
RDW: 12.4 % (ref 11.5–15.5)
RDW: 12.4 % (ref 11.5–15.5)
WBC: 4.3 10*3/uL (ref 4.0–10.5)
WBC: 3.4 10*3/uL — AB (ref 4.0–10.5)

## 2016-03-06 LAB — BASIC METABOLIC PANEL
ANION GAP: 12 (ref 5–15)
BUN: 12 mg/dL (ref 6–20)
CHLORIDE: 92 mmol/L — AB (ref 101–111)
CO2: 31 mmol/L (ref 22–32)
CREATININE: 0.78 mg/dL (ref 0.44–1.00)
Calcium: 9.4 mg/dL (ref 8.9–10.3)
GFR calc non Af Amer: >60 mL/min (ref >60)
Glucose, Bld: 110 mg/dL — ABNORMAL HIGH (ref 65–99)
POTASSIUM: 2.5 mmol/L — AB (ref 3.5–5.1)
SODIUM: 135 mmol/L (ref 135–145)

## 2016-03-06 LAB — COMPREHENSIVE METABOLIC PANEL
ALK PHOS: 48 U/L (ref 38–126)
ALT: 21 U/L (ref 14–54)
AST: 48 U/L — ABNORMAL HIGH (ref 15–41)
Albumin: 3.9 g/dL (ref 3.5–5.0)
Anion gap: 11 (ref 5–15)
BILIRUBIN TOTAL: 0.3 mg/dL (ref 0.3–1.2)
BUN: 15 mg/dL (ref 6–20)
CALCIUM: 8.7 mg/dL — AB (ref 8.9–10.3)
CO2: 29 mmol/L (ref 22–32)
CREATININE: 0.85 mg/dL (ref 0.44–1.00)
Chloride: 91 mmol/L — ABNORMAL LOW (ref 101–111)
GFR calc non Af Amer: >60 mL/min (ref >60)
GLUCOSE: 168 mg/dL — AB (ref 65–99)
Potassium: 2.4 mmol/L — CL (ref 3.5–5.1)
SODIUM: 131 mmol/L — AB (ref 135–145)
TOTAL PROTEIN: 7.2 g/dL (ref 6.5–8.1)

## 2016-03-06 LAB — I-STAT CHEM 8, ED
BUN: 14 mg/dL (ref 6–20)
CHLORIDE: 86 mmol/L — AB (ref 101–111)
CREATININE: 0.7 mg/dL (ref 0.44–1.00)
Calcium, Ion: 1.04 mmol/L — ABNORMAL LOW (ref 1.13–1.30)
Glucose, Bld: 128 mg/dL — ABNORMAL HIGH (ref 65–99)
HEMATOCRIT: 40 % (ref 36.0–46.0)
Hemoglobin: 13.6 g/dL (ref 12.0–15.0)
POTASSIUM: 2.4 mmol/L — AB (ref 3.5–5.1)
SODIUM: 131 mmol/L — AB (ref 135–145)
TCO2: 30 mmol/L (ref 0–100)

## 2016-03-06 LAB — RAPID URINE DRUG SCREEN, HOSP PERFORMED
Amphetamines: POSITIVE — AB
Barbiturates: NOT DETECTED
Benzodiazepines: POSITIVE — AB
Cocaine: NOT DETECTED
Opiates: NOT DETECTED
Tetrahydrocannabinol: NOT DETECTED

## 2016-03-06 LAB — LIPASE, BLOOD: Lipase: 24 U/L (ref 11–51)

## 2016-03-06 LAB — ETHANOL: Alcohol, Ethyl (B): <5 mg/dL

## 2016-03-06 LAB — SALICYLATE LEVEL: Salicylate Lvl: <4 mg/dL (ref 2.8–30.0)

## 2016-03-06 LAB — ACETAMINOPHEN LEVEL

## 2016-03-06 LAB — I-STAT CG4 LACTIC ACID, ED: Lactic Acid, Venous: 1.48 mmol/L (ref 0.5–2.0)

## 2016-03-06 MED ORDER — HYDROXYZINE HCL 25 MG PO TABS
25.0000 mg | ORAL_TABLET | Freq: Three times a day (TID) | ORAL | Status: DC | PRN
Start: 2016-03-06 — End: 2016-03-14
  Administered 2016-03-07 (×2): 25 mg via ORAL
  Filled 2016-03-06 (×3): qty 1

## 2016-03-06 MED ORDER — MELATONIN 10 MG PO CAPS: 10.0000 mg | ORAL_CAPSULE | Freq: Every day | ORAL | Status: DC

## 2016-03-06 MED ORDER — ALBUTEROL SULFATE (2.5 MG/3ML) 0.083% IN NEBU: 2.5000 mg | INHALATION_SOLUTION | RESPIRATORY_TRACT | Status: DC | PRN

## 2016-03-06 MED ORDER — CHLORTHALIDONE 25 MG PO TABS
25.0000 mg | ORAL_TABLET | Freq: Two times a day (BID) | ORAL | Status: DC
Start: 2016-03-06 — End: 2016-03-07
  Administered 2016-03-06 – 2016-03-07 (×2): 25 mg via ORAL
  Filled 2016-03-06 (×3): qty 1

## 2016-03-06 MED ORDER — ENOXAPARIN SODIUM 40 MG/0.4ML ~~LOC~~ SOLN
40.0000 mg | Freq: Every day | SUBCUTANEOUS | Status: DC
  Administered 2016-03-06 – 2016-03-13 (×8): 40 mg via SUBCUTANEOUS
  Filled 2016-03-06 (×8): qty 0.4

## 2016-03-06 MED ORDER — SODIUM CHLORIDE 0.9 % IV BOLUS (SEPSIS)
1000.0000 mL | Freq: Once | INTRAVENOUS | Status: AC
  Administered 2016-03-06: 1000 mL via INTRAVENOUS

## 2016-03-06 MED ORDER — LEVOTHYROXINE SODIUM 100 MCG PO TABS
100.0000 ug | ORAL_TABLET | Freq: Every day | ORAL | Status: DC
  Administered 2016-03-07 – 2016-03-14 (×8): 100 ug via ORAL
  Filled 2016-03-06 (×9): qty 1

## 2016-03-06 MED ORDER — LIOTHYRONINE SODIUM 25 MCG PO TABS
12.5000 ug | ORAL_TABLET | Freq: Two times a day (BID) | ORAL | Status: DC
  Administered 2016-03-06 – 2016-03-14 (×16): 12.5 ug via ORAL
  Filled 2016-03-06 (×18): qty 1

## 2016-03-06 MED ORDER — ZOLPIDEM TARTRATE 5 MG PO TABS
5.0000 mg | ORAL_TABLET | Freq: Every evening | ORAL | Status: DC | PRN
  Administered 2016-03-07 – 2016-03-13 (×5): 5 mg via ORAL
  Filled 2016-03-06 (×5): qty 1

## 2016-03-06 MED ORDER — IPRATROPIUM BROMIDE 0.02 % IN SOLN: 0.5000 mg | RESPIRATORY_TRACT | Status: DC | PRN

## 2016-03-06 MED ORDER — PAROXETINE HCL 20 MG PO TABS
40.0000 mg | ORAL_TABLET | Freq: Every day | ORAL | Status: DC
  Administered 2016-03-07 – 2016-03-14 (×8): 40 mg via ORAL
  Filled 2016-03-06 (×9): qty 2

## 2016-03-06 MED ORDER — DEXTROSE 5 % IV SOLN
1.0000 g | Freq: Once | INTRAVENOUS | Status: AC
  Administered 2016-03-06: 1 g via INTRAVENOUS
  Filled 2016-03-06: qty 10

## 2016-03-06 MED ORDER — PNEUMOCOCCAL VAC POLYVALENT 25 MCG/0.5ML IJ INJ
0.5000 mL | INJECTION | INTRAMUSCULAR | Status: AC
  Administered 2016-03-07: 0.5 mL via INTRAMUSCULAR
  Filled 2016-03-06 (×2): qty 0.5

## 2016-03-06 MED ORDER — BENZTROPINE MESYLATE 0.5 MG PO TABS
0.5000 mg | ORAL_TABLET | Freq: Every day | ORAL | Status: DC
  Administered 2016-03-07 – 2016-03-14 (×8): 0.5 mg via ORAL
  Filled 2016-03-06 (×9): qty 1

## 2016-03-06 MED ORDER — ONDANSETRON HCL 4 MG/2ML IJ SOLN
4.0000 mg | Freq: Once | INTRAMUSCULAR | Status: AC
  Administered 2016-03-06: 4 mg via INTRAVENOUS
  Filled 2016-03-06: qty 2

## 2016-03-06 MED ORDER — POTASSIUM CHLORIDE CRYS ER 20 MEQ PO TBCR
40.0000 meq | EXTENDED_RELEASE_TABLET | Freq: Once | ORAL | Status: AC
  Administered 2016-03-06: 40 meq via ORAL
  Filled 2016-03-06: qty 2

## 2016-03-06 MED ORDER — PRAVASTATIN SODIUM 40 MG PO TABS
80.0000 mg | ORAL_TABLET | Freq: Every day | ORAL | Status: DC
  Administered 2016-03-07 – 2016-03-14 (×8): 80 mg via ORAL
  Filled 2016-03-06 (×9): qty 2

## 2016-03-06 MED ORDER — AZITHROMYCIN 500 MG IV SOLR
500.0000 mg | Freq: Once | INTRAVENOUS | Status: AC
  Administered 2016-03-06: 500 mg via INTRAVENOUS
  Filled 2016-03-06: qty 500

## 2016-03-06 MED ORDER — LISINOPRIL 20 MG PO TABS
20.0000 mg | ORAL_TABLET | Freq: Two times a day (BID) | ORAL | Status: DC
  Administered 2016-03-06 – 2016-03-14 (×16): 20 mg via ORAL
  Filled 2016-03-06 (×16): qty 1

## 2016-03-06 MED ORDER — ATENOLOL 50 MG PO TABS
100.0000 mg | ORAL_TABLET | Freq: Two times a day (BID) | ORAL | Status: DC
  Administered 2016-03-06 – 2016-03-09 (×7): 100 mg via ORAL
  Filled 2016-03-06 (×7): qty 2

## 2016-03-06 MED ORDER — DIAZEPAM 2 MG PO TABS
2.0000 mg | ORAL_TABLET | Freq: Two times a day (BID) | ORAL | Status: DC | PRN
  Administered 2016-03-07 – 2016-03-14 (×6): 2 mg via ORAL
  Filled 2016-03-06 (×7): qty 1

## 2016-03-06 MED ORDER — ATENOLOL-CHLORTHALIDONE 100-25 MG PO TABS: 1.0000 | ORAL_TABLET | Freq: Two times a day (BID) | ORAL | Status: DC

## 2016-03-06 MED ORDER — POTASSIUM CHLORIDE CRYS ER 20 MEQ PO TBCR
20.0000 meq | EXTENDED_RELEASE_TABLET | Freq: Two times a day (BID) | ORAL | Status: DC
  Filled 2016-03-06: qty 1

## 2016-03-06 MED ORDER — POTASSIUM CHLORIDE 10 MEQ/100ML IV SOLN
10.0000 meq | INTRAVENOUS | Status: AC
  Administered 2016-03-06 – 2016-03-07 (×5): 10 meq via INTRAVENOUS
  Filled 2016-03-06 (×5): qty 100

## 2016-03-06 MED ORDER — THIOTHIXENE 2 MG PO CAPS
4.0000 mg | ORAL_CAPSULE | Freq: Every day | ORAL | Status: DC
  Administered 2016-03-06 – 2016-03-13 (×8): 4 mg via ORAL
  Filled 2016-03-06 (×9): qty 2

## 2016-03-06 MED ORDER — ONDANSETRON 8 MG PO TBDP
8.0000 mg | ORAL_TABLET | Freq: Once | ORAL | Status: AC
  Administered 2016-03-06: 8 mg via ORAL
  Filled 2016-03-06: qty 1

## 2016-03-06 NOTE — ED Notes (Signed)
Pt is from home.  At a hospital early this am bc she did not "feel well".  She left without being seen and went home and took 50 Trazadone.  Pt reports she took it "this morning" and will only state before noon.  No bottle was found on scene by EMS for Trazadone.  Pt reports not feeling any different after taking the Trazodone.  Pt reports nausea for last few weeks with loss of appetite the last 4 days. Reports no food intake for last 4 days.  132/72BP  Pt states non compliance with HTN meds for last few days.  CBG 237.

## 2016-03-06 NOTE — ED Notes (Signed)
Pt unwilling to stay for further testing or evaluation. Pt states she does feel better at this time.

## 2016-03-06 NOTE — ED Notes (Signed)
Poison control called RN to check in, suggests re-running EKG upon completion of K runs to determine improvement in QT.

## 2016-03-06 NOTE — ED Notes (Signed)
Pt has given RN permission to share all of her medical information with her estranged husband, Mattingly Neimeyer.  I contacted him at [phone] to give him Pts new room number.  This number is best at night.  If he needs to be reached during the day, he asks that we call him at [phone] which is at his restaurant, [employer].

## 2016-03-06 NOTE — ED Notes (Signed)
Critical Potassium 2.5 Read back and verified  MD notified.

## 2016-03-06 NOTE — ED Notes (Signed)
Pt unable to give urine sample

## 2016-03-06 NOTE — ED Notes (Signed)
Bed: [id number] Expected date:  Expected time:  Means of arrival:  Comments: OD 50 trazadone earlier asymptomatic

## 2016-03-06 NOTE — ED Notes (Signed)
Pt c/o body aches, cough, congestion, sneezing, itchy water eyes and nausea x 3 days; pt states that she cannot get the nausea under control; pt states that she has not vomited or had diarrhea; pt c/o general malaise

## 2016-03-06 NOTE — ED Notes (Signed)
Per MD request, attempted to contact patient at both home and mobile number listed. No answer, unable to leave message.   Numbers called [phone] and [phone]  If patient is to make contact with ER staff she is encouraged to return to the ER immediately for critically low, life threatening low potassium levels.

## 2016-03-06 NOTE — ED Notes (Signed)
RN contacted Pt [place] to clarify Rx for Trazodone as no bottle with Pt or found on scene.  Pt received a refill of 90 pills on 02/29/16.  RN then contacted Poison Control to notify.  New orders placed.  Ingestion of 4-5 grams typically results in mild lethargy and ataxia.  Immediate release resolves in 1-2 hours.  ER resolves in 9 hours.  Half life is 8.8 hours.  Poison control recommends to check labs, tylenol level and liver function and monitor for QT prolongation and airway maintenance.

## 2016-03-06 NOTE — ED Provider Notes (Signed)
CSN: [id number]     Arrival date & time 03/06/16  0058 History  By signing my name below, I, Emmanuella Mensah, attest that this documentation has been prepared under the direction and in the presence of Ripley Fraise, MD. Electronically Signed: Judithann Sauger, ED Scribe. 03/06/2016. 3:36 AM.      Chief Complaint  Patient presents with  . Flu Like Symptoms    Patient is a 60 y.o. female presenting with diarrhea. The history is provided by the patient. No language interpreter was used.  Diarrhea Quality:  Watery Severity:  Moderate Onset quality:  Gradual Duration:  3 days Timing:  Intermittent Progression:  Worsening Relieved by:  None tried Ineffective treatments:  None tried Associated symptoms: no abdominal pain, no fever, no headaches and no vomiting    HPI Comments: Audrey Meyer is a 60 y.o. female with a hx of HTN, acid reflux, and breast cancer (in remission) who presents to the Emergency Department complaining of constant gradually worsening moderate nausea onset 3 days ago. She reports associated increased diarrhea and persistent cough. No alleviating factors noted. Pt has not tried any medications PTA. She denies any recent traveling. No sick contacts noted. She denies any fever, vomiting, HA, CP, back pain, abdominal pain, or blood in stool.   Past Medical History  Diagnosis Date  . Hypothyroid   . High cholesterol   . Acid reflux   . COPD (chronic obstructive pulmonary disease) ([place])   . Hypertension   . Schizoaffective disorder   . Breast cancer [facility])    Past Surgical History  Procedure Laterality Date  . Mastectomy, partial    . Appendectomy     Family History  Problem Relation Age of Onset  . Heart attack Father   . Cancer Other    Social History  Substance Use Topics  . Smoking status: Current Every Day Smoker -- 1.00 packs/day    Types: Cigarettes, Cigars  . Smokeless tobacco: None  . Alcohol Use: No   OB History    No data available      Review of Systems  Constitutional: Negative for fever.  Respiratory: Positive for cough.   Cardiovascular: Negative for chest pain.  Gastrointestinal: Positive for nausea and diarrhea. Negative for vomiting, abdominal pain and blood in stool.  Musculoskeletal: Negative for back pain.  Neurological: Negative for headaches.  All other systems reviewed and are negative.     Allergies  Budesonide  Home Medications   Prior to Admission medications   Medication Sig Start Date End Date Taking? Authorizing Provider  albuterol (PROVENTIL HFA;VENTOLIN HFA) 108 (90 BASE) MCG/ACT inhaler Inhale 2 puffs into the lungs every 6 (six) hours as needed. For shortness of breath    Historical Provider, MD  atenolol-chlorthalidone (TENORETIC) 100-25 MG per tablet Take 0.5 tablets by mouth 2 (two) times daily.     Historical Provider, MD  benztropine (COGENTIN) 0.5 MG tablet Take 0.5 mg by mouth daily.    Historical Provider, MD  dextromethorphan-guaiFENesin (MUCINEX DM) 30-600 MG per 12 hr tablet Take 1 tablet by mouth every 12 (twelve) hours as needed. For cough & congestion    Historical Provider, MD  diazepam (VALIUM) 5 MG tablet Take 5 mg by mouth every 12 (twelve) hours as needed. For anxiety    Historical Provider, MD  esomeprazole (NEXIUM) 40 MG capsule Take 40 mg by mouth daily before breakfast.    Historical Provider, MD  HYDROcodone-acetaminophen (NORCO) 5-325 MG per tablet Take 1 tablet by mouth every  6 (six) hours as needed. For pain    Historical Provider, MD  levothyroxine (SYNTHROID, LEVOTHROID) 100 MCG tablet Take 100 mcg by mouth daily.    Historical Provider, MD  liothyronine (CYTOMEL) 25 MCG tablet Take 12.5 mcg by mouth 2 (two) times daily.     Historical Provider, MD  lisinopril (PRINIVIL,ZESTRIL) 20 MG tablet Take 20 mg by mouth daily.    Historical Provider, MD  Melatonin 3 MG TABS Take 1 tablet by mouth daily as needed. For sleep    Historical Provider, MD  PARoxetine (PAXIL-CR)  25 MG 24 hr tablet Take 50 mg by mouth daily.     Historical Provider, MD  potassium chloride (K-DUR,KLOR-CON) 10 MEQ tablet Take 10 mEq by mouth 2 (two) times daily.     Historical Provider, MD  pravastatin (PRAVACHOL) 20 MG tablet Take 20 mg by mouth daily.    Historical Provider, MD  thiothixene (NAVANE) 2 MG capsule Take 2 mg by mouth as needed (takes with navane 5mg  if needed).    Historical Provider, MD  thiothixene (NAVANE) 5 MG capsule Take 5 mg by mouth daily.    Historical Provider, MD  traZODone (DESYREL) 100 MG tablet Take 200 mg by mouth at bedtime. scheduled    Historical Provider, MD  traZODone (DESYREL) 100 MG tablet Take 2 tablets (200 mg total) by mouth at bedtime. 10/12/13   Peter Dammen, PA-C   BP 148/80 mmHg  Pulse 83  Temp(Src) 98.4 F (36.9 C) (Oral)  Resp 20  Ht 5\' 2"  (1.575 m)  Wt 142 lb (64.411 kg)  BMI 25.97 kg/m2  SpO2 89% Physical Exam  Nursing note and vitals reviewed.  CONSTITUTIONAL: Well developed/well nourished HEAD: Normocephalic/atraumatic EYES: EOMI/PERRL, no icterus ENMT: Mucous membranes dry NECK: supple no meningeal signs SPINE/BACK:entire spine nontender CV: S1/S2 noted, no murmurs/rubs/gallops noted LUNGS: Lungs are clear to auscultation bilaterally, no apparent distress ABDOMEN: soft, nontender, no rebound or guarding, bowel sounds noted throughout abdomen GU:no cva tenderness NEURO: Pt is awake/alert/appropriate, moves all extremitiesx4.  No facial droop.   EXTREMITIES: pulses normal/equal, full ROM SKIN: warm, color normal PSYCH: no abnormalities of mood noted, alert and oriented to situation  ED Course  Procedures  DIAGNOSTIC STUDIES: Oxygen Saturation is 89% on RA, low by my interpretation.    COORDINATION OF CARE: 3:14 AM- Pt advised of plan for treatment and pt agrees. Pt will receive Zofran PO. She will also receive chest x-ray, EKG, and lab work for further evaluation.   Prior to evaluation was complete, patient left the  ED After she left her potassium came back abnormal Apparently pt felt better and wanted to leave Nursing staff has called and left message for patient to come back for treatment of hYPOkalemia    Labs Review Labs Reviewed  CBC WITH DIFFERENTIAL/PLATELET - Abnormal; Notable for the following:    MCHC 36.1 (*)    All other components within normal limits  BASIC METABOLIC PANEL - Abnormal; Notable for the following:    Potassium 2.5 (*)    Chloride 92 (*)    Glucose, Bld 110 (*)    All other components within normal limits    Imaging Review Dg Chest 2 View  03/06/2016  CLINICAL DATA:  Acute onset of worsening nausea, diarrhea and cough. Initial encounter. EXAM: CHEST  2 VIEW COMPARISON:  Chest radiograph performed 10/15/2012 FINDINGS: The lungs are well-aerated. Mild peribronchial thickening is noted. Minimal bibasilar opacities could reflect mild pneumonia, depending on the patient's symptoms. There is  no evidence of pleural effusion or pneumothorax. The heart is borderline normal in size. No acute osseous abnormalities are seen. IMPRESSION: Minimal bibasilar opacities could reflect mild pneumonia, depending on the patient's symptoms. Mild peribronchial thickening noted. Electronically Signed   By: Garald Balding M.D.   On: 03/06/2016 03:35     Ripley Fraise, MD has personally reviewed and evaluated these images and lab results as part of his medical decision-making.   EKG Interpretation   Date/Time:  Sunday March 06 2016 03:40:23 EDT Ventricular Rate:  72 PR Interval:  148 QRS Duration: 96 QT Interval:  411 QTC Calculation: 450 R Axis:   88 Text Interpretation:  Sinus rhythm Borderline right axis deviation Minimal  ST depression, lateral leads ? u wave Abnormal ekg Confirmed by Christy Gentles   MD, Elenore Rota ([id number]) on 03/06/2016 4:14:22 AM      MDM   Final diagnoses:  Diarrhea, unspecified type    Nursing notes including past medical history and social history reviewed and  considered in documentation Labs/vital reviewed myself and considered during evaluation xrays/imaging reviewed by myself and considered during evaluation   I personally performed the services described in this documentation, which was scribed in my presence. The recorded information has been reviewed and is accurate.        Ripley Fraise, MD 03/06/16 [phone]

## 2016-03-06 NOTE — ED Provider Notes (Signed)
CSN: [id number]     Arrival date & time 03/06/16  1742 History   First MD Initiated Contact with Patient 03/06/16 1758     Chief Complaint  Patient presents with  . Drug Overdose  . Suicidal     (Consider location/radiation/quality/duration/timing/severity/associated sxs/prior Treatment) HPI Comments: Called husband (separated) at [place] and said "I want to die" He called 911 Trazodone, thinks took 50 tablets unsure amount Thinks took 4 or 5AM Took them all at once No etoh, no other medication ingestion  In 2002 tried to OD twice  Has not been taking K pills for unknown period of time Taking medications  Goes to [facility]  35yrs without anything Now separation, having anxiety  Cough, chronic however worsening, productive No increased SOB, no chest pain Nausea, severe  Came to ED last night and left prior to evaluation complete Potassium was low but did not get replacement before she left and has not been taking pills at home  Taking mental health pills   Patient is a 60 y.o. female presenting with Overdose. The history is provided by the spouse and the patient.  Drug Overdose Associated symptoms include headaches. Pertinent negatives include no chest pain, no abdominal pain and no shortness of breath.    Past Medical History  Diagnosis Date  . Hypothyroid   . High cholesterol   . Acid reflux   . COPD (chronic obstructive pulmonary disease) ([place])   . Hypertension   . Schizoaffective disorder   . Breast cancer [facility])    Past Surgical History  Procedure Laterality Date  . Mastectomy, partial    . Appendectomy     Family History  Problem Relation Age of Onset  . Heart attack Father   . Cancer Other    Social History  Substance Use Topics  . Smoking status: Current Every Day Smoker -- 1.00 packs/day    Types: Cigarettes, Cigars  . Smokeless tobacco: None  . Alcohol Use: No   OB History    No data available     Review of Systems  Constitutional: Positive  for appetite change. Negative for fever and fatigue.  HENT: Negative for sore throat.   Eyes: Negative for visual disturbance.  Respiratory: Negative for cough and shortness of breath.   Cardiovascular: Negative for chest pain.  Gastrointestinal: Positive for nausea and diarrhea (problems with colitis in past). Negative for vomiting, abdominal pain and constipation.  Genitourinary: Negative for dysuria and difficulty urinating.  Musculoskeletal: Positive for myalgias and arthralgias. Negative for back pain and neck pain.  Skin: Negative for rash.  Neurological: Positive for headaches. Negative for syncope.  Psychiatric/Behavioral: Positive for suicidal ideas, sleep disturbance and self-injury.      Allergies  Budesonide  Home Medications   Prior to Admission medications   Medication Sig Start Date End Date Taking? Authorizing Provider  albuterol (PROVENTIL HFA;VENTOLIN HFA) 108 (90 BASE) MCG/ACT inhaler Inhale 2 puffs into the lungs every 6 (six) hours as needed. For shortness of breath   Yes Historical Provider, MD  atenolol-chlorthalidone (TENORETIC) 100-25 MG per tablet Take 1 tablet by mouth 2 (two) times daily.    Yes Historical Provider, MD  benztropine (COGENTIN) 0.5 MG tablet Take 0.5 mg by mouth daily. Reported on 03/06/2016   Yes Historical Provider, MD  dextromethorphan (DELSYM) 30 MG/5ML liquid Take 30 mg by mouth as needed for cough.   Yes Historical Provider, MD  diazepam (VALIUM) 5 MG tablet Take 5 mg by mouth every 12 (twelve) hours as needed.  For anxiety   Yes Historical Provider, MD  hydrOXYzine (VISTARIL) 25 MG capsule Take 25 mg by mouth 3 (three) times daily as needed for anxiety.   Yes Historical Provider, MD  levothyroxine (SYNTHROID, LEVOTHROID) 100 MCG tablet Take 100 mcg by mouth daily.   Yes Historical Provider, MD  liothyronine (CYTOMEL) 25 MCG tablet Take 12.5 mcg by mouth 2 (two) times daily.    Yes Historical Provider, MD  lisinopril (PRINIVIL,ZESTRIL) 20 MG  tablet Take 20 mg by mouth 2 (two) times daily.    Yes Historical Provider, MD  Melatonin 10 MG CAPS Take 10 mg by mouth at bedtime.   Yes Historical Provider, MD  PARoxetine (PAXIL) 40 MG tablet Take 40 mg by mouth every morning.   Yes Historical Provider, MD  potassium chloride SA (K-DUR,KLOR-CON) 20 MEQ tablet Take 20 mEq by mouth 2 (two) times daily.   Yes Historical Provider, MD  pravastatin (PRAVACHOL) 80 MG tablet Take 80 mg by mouth daily.   Yes Historical Provider, MD  pseudoephedrine-guaifenesin (MUCINEX D) 60-600 MG 12 hr tablet Take 2 tablets by mouth daily as needed for congestion.   Yes Historical Provider, MD  thiothixene (NAVANE) 2 MG capsule Take 4 mg by mouth at bedtime.   Yes Historical Provider, MD  traZODone (DESYREL) 100 MG tablet Take 2 tablets (200 mg total) by mouth at bedtime. 10/12/13  Yes Peter Dammen, PA-C   BP 145/70 mmHg  Pulse 87  Temp(Src) 98.9 F (37.2 C) (Oral)  Resp 16  Ht 5\' 2"  (1.575 m)  Wt 146 lb 4.8 oz (66.361 kg)  BMI 26.75 kg/m2  SpO2 96% Physical Exam  Constitutional: She is oriented to person, place, and time. She appears well-developed and well-nourished. No distress.  HENT:  Head: Normocephalic and atraumatic.  Eyes: Conjunctivae and EOM are normal.  Neck: Normal range of motion.  Cardiovascular: Normal rate, regular rhythm, normal heart sounds and intact distal pulses.  Exam reveals no gallop and no friction rub.   No murmur heard. Pulmonary/Chest: Effort normal and breath sounds normal. No respiratory distress. She has no wheezes. She has no rales.  Abdominal: Soft. She exhibits no distension. There is no tenderness. There is no guarding.  Musculoskeletal: She exhibits no edema or tenderness.  Neurological: She is alert and oriented to person, place, and time.  Skin: Skin is warm and dry. No rash noted. She is not diaphoretic. No erythema.  Nursing note and vitals reviewed.   ED Course  Procedures (including critical care time) Labs  Review Labs Reviewed  CBC WITH DIFFERENTIAL/PLATELET - Abnormal; Notable for the following:    WBC 3.4 (*)    Lymphs Abs 0.5 (*)    All other components within normal limits  COMPREHENSIVE METABOLIC PANEL - Abnormal; Notable for the following:    Sodium 131 (*)    Potassium 2.4 (*)    Chloride 91 (*)    Glucose, Bld 168 (*)    Calcium 8.7 (*)    AST 48 (*)    All other components within normal limits  ACETAMINOPHEN LEVEL - Abnormal; Notable for the following:    Acetaminophen (Tylenol), Serum <10 (*)    All other components within normal limits  URINE RAPID DRUG SCREEN, HOSP PERFORMED - Abnormal; Notable for the following:    Benzodiazepines POSITIVE (*)    Amphetamines POSITIVE (*)    All other components within normal limits  I-STAT CHEM 8, ED - Abnormal; Notable for the following:    Sodium 131 (*)  Potassium 2.4 (*)    Chloride 86 (*)    Glucose, Bld 128 (*)    Calcium, Ion 1.04 (*)    All other components within normal limits  CULTURE, BLOOD (ROUTINE X 2)  CULTURE, BLOOD (ROUTINE X 2)  CULTURE, EXPECTORATED SPUTUM-ASSESSMENT  GRAM STAIN  LIPASE, BLOOD  SALICYLATE LEVEL  ETHANOL  STREP PNEUMONIAE URINARY ANTIGEN  LEGIONELLA PNEUMOPHILA SEROGP 1 UR AG  T4, FREE  T3, FREE  CBC WITH DIFFERENTIAL/PLATELET  COMPREHENSIVE METABOLIC PANEL  MAGNESIUM  I-STAT CG4 LACTIC ACID, ED  I-STAT CG4 LACTIC ACID, ED    Imaging Review Dg Chest 2 View  03/06/2016  CLINICAL DATA:  Hypoxia EXAM: CHEST  2 VIEW COMPARISON:  03/06/2016 FINDINGS: Cardiac shadow is within normal limits. The lungs are well aerated bilaterally. Persistent bibasilar changes are noted which may represent early infiltrate. The overall appearance is stable from this study obtained earlier in the same day. IMPRESSION: Persistent bibasilar changes. Electronically Signed   By: Inez Catalina M.D.   On: 03/06/2016 19:18   Dg Chest 2 View  03/06/2016  CLINICAL DATA:  Acute onset of worsening nausea, diarrhea and cough.  Initial encounter. EXAM: CHEST  2 VIEW COMPARISON:  Chest radiograph performed 10/15/2012 FINDINGS: The lungs are well-aerated. Mild peribronchial thickening is noted. Minimal bibasilar opacities could reflect mild pneumonia, depending on the patient's symptoms. There is no evidence of pleural effusion or pneumothorax. The heart is borderline normal in size. No acute osseous abnormalities are seen. IMPRESSION: Minimal bibasilar opacities could reflect mild pneumonia, depending on the patient's symptoms. Mild peribronchial thickening noted. Electronically Signed   By: Garald Balding M.D.   On: 03/06/2016 03:35   I have personally reviewed and evaluated these images and lab results as part of my medical decision-making.   EKG Interpretation   Date/Time:  Sunday March 06 2016 18:33:45 EDT Ventricular Rate:  75 PR Interval:  154 QRS Duration: 104 QT Interval:  475 QTC Calculation: 531 R Axis:   89 Text Interpretation:  Sinus rhythm Borderline right axis deviation  Prolonged QT interval Nonspecific ST abnormality Anterior leads No other  significant chaneg from prior ECG Confirmed by [facility] MD, [place] ([id number])  on 03/06/2016 6:39:53 PM      MDM   Final diagnoses:  None   60yo female with history of COPD, remote breast cancer, schizoaffective disorder, hypercholesterolemia, visit to the ED late last night with concern for nausea, cough, body aches with findings of hypokalemia then eloping from ED before treatment was given, presents with concern for suicide attempt using trazodone.  Regarding trazodone overdose, patient reports taking 50 tablets approximately 12 hours ago. She is alert, appropriate, and given time since ingestion have low suspicion for continued complications at this time. Tylenol and salycilate negative.  ECG with prolonged QTc.   ECG findings likely secondary to hypokalemia.  Patient reports continuing suicidal ideation and will be appropriate for psychiatry evaluation and  likely inpatient treatment when medically cleared.   Patient yesterday presented with cough, nausea.  XR reviewed and concerning for possible pneumonia.  Patient with hypoxia to 86% on room air.  Hx of COPD however no prior hx of hypoxia or O2 requirement.  Pt likely with CAP as etiology of symptoms and hypoxia.  Blood cx and rocephin/azithromycin ordered.  Ordered IV and po potassium replacement.  Patient to be admitted to hospitalist for further care of hypokalemia, pneumonia with hypoxia, and suicidal ideation.      Gareth Morgan, MD 03/07/16 [phone]

## 2016-03-07 DIAGNOSIS — T43212A Poisoning by selective serotonin and norepinephrine reuptake inhibitors, intentional self-harm, initial encounter: Secondary | ICD-10-CM | POA: Diagnosis not present

## 2016-03-07 DIAGNOSIS — T43212S Poisoning by selective serotonin and norepinephrine reuptake inhibitors, intentional self-harm, sequela: Secondary | ICD-10-CM

## 2016-03-07 DIAGNOSIS — R45851 Suicidal ideations: Secondary | ICD-10-CM

## 2016-03-07 DIAGNOSIS — Z720 Tobacco use: Secondary | ICD-10-CM | POA: Diagnosis present

## 2016-03-07 DIAGNOSIS — E039 Hypothyroidism, unspecified: Secondary | ICD-10-CM | POA: Diagnosis present

## 2016-03-07 DIAGNOSIS — J441 Chronic obstructive pulmonary disease with (acute) exacerbation: Secondary | ICD-10-CM | POA: Diagnosis present

## 2016-03-07 DIAGNOSIS — T50901A Poisoning by unspecified drugs, medicaments and biological substances, accidental (unintentional), initial encounter: Secondary | ICD-10-CM | POA: Diagnosis present

## 2016-03-07 DIAGNOSIS — I10 Essential (primary) hypertension: Secondary | ICD-10-CM | POA: Diagnosis present

## 2016-03-07 DIAGNOSIS — E876 Hypokalemia: Secondary | ICD-10-CM | POA: Diagnosis present

## 2016-03-07 DIAGNOSIS — F251 Schizoaffective disorder, depressive type: Secondary | ICD-10-CM | POA: Diagnosis present

## 2016-03-07 LAB — COMPREHENSIVE METABOLIC PANEL
ALBUMIN: 3.2 g/dL — AB (ref 3.5–5.0)
ALK PHOS: 41 U/L (ref 38–126)
ALT: 17 U/L (ref 14–54)
AST: 43 U/L — AB (ref 15–41)
Anion gap: 8 (ref 5–15)
BUN: 10 mg/dL (ref 6–20)
CHLORIDE: 99 mmol/L — AB (ref 101–111)
CO2: 27 mmol/L (ref 22–32)
CREATININE: 0.64 mg/dL (ref 0.44–1.00)
Calcium: 7.9 mg/dL — ABNORMAL LOW (ref 8.9–10.3)
GFR calc Af Amer: >60 mL/min (ref >60)
GLUCOSE: 100 mg/dL — AB (ref 65–99)
POTASSIUM: 2.9 mmol/L — AB (ref 3.5–5.1)
SODIUM: 134 mmol/L — AB (ref 135–145)
Total Bilirubin: 0.2 mg/dL — ABNORMAL LOW (ref 0.3–1.2)
Total Protein: 5.9 g/dL — ABNORMAL LOW (ref 6.5–8.1)

## 2016-03-07 LAB — CBC WITH DIFFERENTIAL/PLATELET
BASOS ABS: 0 10*3/uL (ref 0.0–0.1)
BASOS PCT: 0 %
EOS ABS: 0 10*3/uL (ref 0.0–0.7)
Eosinophils Relative: 0 %
HEMATOCRIT: 36.9 % (ref 36.0–46.0)
HEMOGLOBIN: 13.3 g/dL (ref 12.0–15.0)
Lymphocytes Relative: 32 %
Lymphs Abs: 1.4 10*3/uL (ref 0.7–4.0)
MCH: 30.4 pg (ref 26.0–34.0)
MCHC: 36 g/dL (ref 30.0–36.0)
MCV: 84.2 fL (ref 78.0–100.0)
MONOS PCT: 23 %
Monocytes Absolute: 1 10*3/uL (ref 0.1–1.0)
NEUTROS ABS: 2 10*3/uL (ref 1.7–7.7)
NEUTROS PCT: 45 %
Platelets: 150 10*3/uL (ref 150–400)
RBC: 4.38 MIL/uL (ref 3.87–5.11)
RDW: 12.4 % (ref 11.5–15.5)
WBC: 4.4 10*3/uL (ref 4.0–10.5)

## 2016-03-07 LAB — T4, FREE: FREE T4: 0.8 ng/dL (ref 0.61–1.12)

## 2016-03-07 LAB — MAGNESIUM: Magnesium: 1.4 mg/dL — ABNORMAL LOW (ref 1.7–2.4)

## 2016-03-07 LAB — C DIFFICILE QUICK SCREEN W PCR REFLEX
C DIFFICILE (CDIFF) INTERP: NEGATIVE
C Diff antigen: NEGATIVE
C Diff toxin: NEGATIVE

## 2016-03-07 LAB — STREP PNEUMONIAE URINARY ANTIGEN: STREP PNEUMO URINARY ANTIGEN: NEGATIVE

## 2016-03-07 MED ORDER — DEXTROSE 5 % IV SOLN
500.0000 mg | INTRAVENOUS | Status: DC
  Administered 2016-03-07: 500 mg via INTRAVENOUS
  Filled 2016-03-07: qty 500

## 2016-03-07 MED ORDER — IPRATROPIUM-ALBUTEROL 0.5-2.5 (3) MG/3ML IN SOLN
3.0000 mL | Freq: Three times a day (TID) | RESPIRATORY_TRACT | Status: DC
  Administered 2016-03-08: 3 mL via RESPIRATORY_TRACT
  Filled 2016-03-07: qty 3

## 2016-03-07 MED ORDER — POTASSIUM CHLORIDE CRYS ER 20 MEQ PO TBCR
40.0000 meq | EXTENDED_RELEASE_TABLET | ORAL | Status: AC
  Administered 2016-03-07: 40 meq via ORAL
  Filled 2016-03-07: qty 2

## 2016-03-07 MED ORDER — PREDNISONE 20 MG PO TABS
20.0000 mg | ORAL_TABLET | Freq: Every day | ORAL | Status: DC
  Administered 2016-03-07 – 2016-03-13 (×7): 20 mg via ORAL
  Filled 2016-03-07 (×9): qty 1

## 2016-03-07 MED ORDER — SPIRONOLACTONE 25 MG PO TABS
25.0000 mg | ORAL_TABLET | Freq: Every day | ORAL | Status: DC
  Administered 2016-03-07 – 2016-03-14 (×8): 25 mg via ORAL
  Filled 2016-03-07 (×9): qty 1

## 2016-03-07 MED ORDER — GUAIFENESIN ER 600 MG PO TB12
600.0000 mg | ORAL_TABLET | Freq: Two times a day (BID) | ORAL | Status: DC
  Administered 2016-03-07 – 2016-03-09 (×6): 600 mg via ORAL
  Filled 2016-03-07 (×6): qty 1

## 2016-03-07 MED ORDER — DEXTROSE 5 % IV SOLN: 1.0000 g | INTRAVENOUS | Status: DC

## 2016-03-07 MED ORDER — IPRATROPIUM-ALBUTEROL 0.5-2.5 (3) MG/3ML IN SOLN
3.0000 mL | Freq: Four times a day (QID) | RESPIRATORY_TRACT | Status: DC
  Administered 2016-03-07 (×2): 3 mL via RESPIRATORY_TRACT
  Filled 2016-03-07 (×2): qty 3

## 2016-03-07 MED ORDER — ONDANSETRON HCL 4 MG/2ML IJ SOLN
4.0000 mg | Freq: Four times a day (QID) | INTRAMUSCULAR | Status: DC | PRN
  Administered 2016-03-07 (×2): 4 mg via INTRAVENOUS
  Filled 2016-03-07 (×2): qty 2

## 2016-03-07 MED ORDER — POTASSIUM CHLORIDE CRYS ER 20 MEQ PO TBCR
40.0000 meq | EXTENDED_RELEASE_TABLET | Freq: Two times a day (BID) | ORAL | Status: AC
  Administered 2016-03-07 (×2): 40 meq via ORAL
  Filled 2016-03-07: qty 2

## 2016-03-07 MED ORDER — MAGNESIUM SULFATE 2 GM/50ML IV SOLN
2.0000 g | Freq: Once | INTRAVENOUS | Status: AC
  Administered 2016-03-07: 2 g via INTRAVENOUS
  Filled 2016-03-07: qty 50

## 2016-03-07 NOTE — Consult Note (Signed)
[facility] Consult   Reason for Consult:  Depression and intentional overdose Referring Physician:  Dr. Sloan Leiter Patient Identification: Audrey Meyer MRN:  [id number] Principal Diagnosis: Overdose Diagnosis:   Patient Active Problem List   Diagnosis Date Noted  . Overdose [T50.901A] 03/07/2016  . Hypokalemia [E87.6] 03/07/2016  . Hypothyroidism [E03.9] 03/07/2016  . Schizoaffective disorder ([place]) [F25.9] 03/07/2016  . Essential hypertension [I10] 03/07/2016  . COPD exacerbation ([place]) [J44.1] 03/07/2016  . Tobacco abuse [Z72.0] 03/07/2016  . Pneumonia [J18.9] 03/06/2016  . CAP (community acquired pneumonia) [J18.9] 03/06/2016    Total Time spent with patient: 1 hour  Subjective:   Audrey Meyer is a 60 y.o. female patient admitted with intentional overdose.  HPI:  Audrey Meyer is a 60 year old female seen, chart reviewed for psychiatric consultation and evaluation of schizoaffective disorder and status post intentional prescription drug overdose-50 tablets of trazodone and also feeling sick with her stomach. Patient endorses feeling depressed, anxious, paranoid and intentional drug overdose. Patient also endorses physical sickness, nausea, diarrhea and productive cough 3 days Patient stated she called 911 after her husband asked her to contact the emergency medical services. Reportedly patient informed to her husband about overdose on phone. Patient is a poor historian regarding talking about her psychosocial stresses. Patient stated that she has been having panic episodes more frequently, her husband also has anxiety disorder and they have decided not to stay together about a year ago in August. Patient stated that she has made the decision to be separated and divorced. Patient is concerned about this coming August will be receiving divorce from the court. Patient has a difficulty to express her depression, anxiety and not able to endorse suicidal ideation, homicidal  ideation, auditory or visual hallucinations during my evaluation.  Medical condition: She had just actually been seen in the emergency department yesterday complaining of nausea for the last 3 days with increased diarrhea, and persistent productive cough. Chest x-ray performed yesterday showed signs of by basilar opacities suggestive of a possible pneumonia. She was seen to have hypokalemia of 2.4 at that time for which they tried to give her potassium, but left AMA prior to receiving any potassium supplements. Patient has not been taking her home potassium supplements as prescribed and unsure of other medications. Upon admission patient was seen to have a oxygen saturations of 86% on room air which she required nasal cannula oxygen of 2 L to improve O2 sats greater than 92%. Patient UDS is positive for amphetamine and benzo's.   Past Psychiatric History: Patient has been diagnosed with schizoaffective disorder and has been receiving outpatient medication management from [facility]. She has 1 previous intentional overdose 2002 required acute psychiatric hospitalization.  Risk to Self: Is patient at risk for suicide?: No (Pt reports no thoughts or plans but took an overdose at some time before noon today) Risk to Others:   Prior Inpatient Therapy:   Prior Outpatient Therapy:    Past Medical History:  Past Medical History  Diagnosis Date  . Hypothyroid   . High cholesterol   . Acid reflux   . COPD (chronic obstructive pulmonary disease) ([place])   . Hypertension   . Schizoaffective disorder   . Breast cancer [facility])     Past Surgical History  Procedure Laterality Date  . Mastectomy, partial    . Appendectomy     Family History:  Family History  Problem Relation Age of Onset  . Heart attack Father   . Cancer Other  Family Psychiatric  History: Unknown Social History:  History  Alcohol Use No     History  Drug Use No    Social History   Social History  . Marital Status: Legally  Separated    Spouse Name: N/A  . Number of Children: N/A  . Years of Education: N/A   Social History Main Topics  . Smoking status: Current Every Day Smoker -- 1.00 packs/day    Types: Cigarettes, Cigars  . Smokeless tobacco: None  . Alcohol Use: No  . Drug Use: No  . Sexual Activity: Not Asked   Other Topics Concern  . None   Social History Narrative   Additional Social History:    Allergies:   Allergies  Allergen Reactions  . Budesonide Shortness Of Breath    Labs:  Results for orders placed or performed during the hospital encounter of 03/06/16 (from the past 48 hour(s))  CBC with Differential     Status: Abnormal   Collection Time: 03/06/16  7:20 PM  Result Value Ref Range   WBC 3.4 (L) 4.0 - 10.5 K/uL   RBC 4.52 3.87 - 5.11 MIL/uL   Hemoglobin 13.6 12.0 - 15.0 g/dL   HCT 37.8 36.0 - 46.0 %   MCV 83.6 78.0 - 100.0 fL   MCH 30.1 26.0 - 34.0 pg   MCHC 36.0 30.0 - 36.0 g/dL   RDW 12.4 11.5 - 15.5 %   Platelets 174 150 - 400 K/uL   Neutrophils Relative % 71 %   Neutro Abs 2.4 1.7 - 7.7 K/uL   Lymphocytes Relative 15 %   Lymphs Abs 0.5 (L) 0.7 - 4.0 K/uL   Monocytes Relative 14 %   Monocytes Absolute 0.5 0.1 - 1.0 K/uL   Eosinophils Relative 0 %   Eosinophils Absolute 0.0 0.0 - 0.7 K/uL   Basophils Relative 0 %   Basophils Absolute 0.0 0.0 - 0.1 K/uL  Comprehensive metabolic panel     Status: Abnormal   Collection Time: 03/06/16  7:20 PM  Result Value Ref Range   Sodium 131 (L) 135 - 145 mmol/L   Potassium 2.4 (LL) 3.5 - 5.1 mmol/L    Comment: CONSISTENT WITH PREVIOUS RESULT CRITICAL RESULT CALLED TO, READ BACK BY AND VERIFIED WITH: J.HAMILTON,RN AT 2011 ON 4.2.17 BY W.SHEA    Chloride 91 (L) 101 - 111 mmol/L   CO2 29 22 - 32 mmol/L   Glucose, Bld 168 (H) 65 - 99 mg/dL   BUN 15 6 - 20 mg/dL   Creatinine, Ser 0.85 0.44 - 1.00 mg/dL   Calcium 8.7 (L) 8.9 - 10.3 mg/dL   Total Protein 7.2 6.5 - 8.1 g/dL   Albumin 3.9 3.5 - 5.0 g/dL   AST 48 (H) 15 - 41  U/L   ALT 21 14 - 54 U/L   Alkaline Phosphatase 48 38 - 126 U/L   Total Bilirubin 0.3 0.3 - 1.2 mg/dL   GFR calc non Af Amer >60 >60 mL/min   GFR calc Af Amer >60 >60 mL/min    Comment: (NOTE) The eGFR has been calculated using the CKD EPI equation. This calculation has not been validated in all clinical situations. eGFR's persistently <60 mL/min signify possible Chronic Kidney Disease.    Anion gap 11 5 - 15  Lipase, blood     Status: None   Collection Time: 03/06/16  7:20 PM  Result Value Ref Range   Lipase 24 11 - 51 U/L  Acetaminophen level       Status: Abnormal   Collection Time: 03/06/16  7:20 PM  Result Value Ref Range   Acetaminophen (Tylenol), Serum <10 (L) 10 - 30 ug/mL    Comment:        THERAPEUTIC CONCENTRATIONS VARY SIGNIFICANTLY. A RANGE OF 10-30 ug/mL MAY BE AN EFFECTIVE CONCENTRATION FOR MANY PATIENTS. HOWEVER, SOME ARE BEST TREATED AT CONCENTRATIONS OUTSIDE THIS RANGE. ACETAMINOPHEN CONCENTRATIONS >150 ug/mL AT 4 HOURS AFTER INGESTION AND >50 ug/mL AT 12 HOURS AFTER INGESTION ARE OFTEN ASSOCIATED WITH TOXIC REACTIONS.   Salicylate level     Status: None   Collection Time: 03/06/16  7:20 PM  Result Value Ref Range   Salicylate Lvl <0.3 2.8 - 30.0 mg/dL  Ethanol     Status: None   Collection Time: 03/06/16  7:20 PM  Result Value Ref Range   Alcohol, Ethyl (B) <5 <5 mg/dL    Comment:        LOWEST DETECTABLE LIMIT FOR SERUM ALCOHOL IS 5 mg/dL FOR MEDICAL PURPOSES ONLY   I-Stat CG4 Lactic Acid, ED     Status: None   Collection Time: 03/06/16  8:36 PM  Result Value Ref Range   Lactic Acid, Venous 1.48 0.5 - 2.0 mmol/L  I-stat chem 8, ed     Status: Abnormal   Collection Time: 03/06/16  8:48 PM  Result Value Ref Range   Sodium 131 (L) 135 - 145 mmol/L   Potassium 2.4 (LL) 3.5 - 5.1 mmol/L   Chloride 86 (L) 101 - 111 mmol/L   BUN 14 6 - 20 mg/dL   Creatinine, Ser 0.70 0.44 - 1.00 mg/dL   Glucose, Bld 128 (H) 65 - 99 mg/dL   Calcium, Ion 1.04 (L)  1.13 - 1.30 mmol/L   TCO2 30 0 - 100 mmol/L   Hemoglobin 13.6 12.0 - 15.0 g/dL   HCT 40.0 36.0 - 46.0 %  Urine rapid drug screen (hosp performed)     Status: Abnormal   Collection Time: 03/06/16 10:47 PM  Result Value Ref Range   Opiates NONE DETECTED NONE DETECTED   Cocaine NONE DETECTED NONE DETECTED   Benzodiazepines POSITIVE (A) NONE DETECTED   Amphetamines POSITIVE (A) NONE DETECTED   Tetrahydrocannabinol NONE DETECTED NONE DETECTED   Barbiturates NONE DETECTED NONE DETECTED    Comment:        DRUG SCREEN FOR MEDICAL PURPOSES ONLY.  IF CONFIRMATION IS NEEDED FOR ANY PURPOSE, NOTIFY LAB WITHIN 5 DAYS.        LOWEST DETECTABLE LIMITS FOR URINE DRUG SCREEN Drug Class       Cutoff (ng/mL) Amphetamine      1000 Barbiturate      200 Benzodiazepine   546 Tricyclics       568 Opiates          300 Cocaine          300 THC              50   Strep pneumoniae urinary antigen     Status: None   Collection Time: 03/06/16 10:47 PM  Result Value Ref Range   Strep Pneumo Urinary Antigen NEGATIVE NEGATIVE    Comment:        Infection due to S. pneumoniae cannot be absolutely ruled out since the antigen present may be below the detection limit of the test. Performed at [facility]   T4, free     Status: None   Collection Time: 03/07/16  4:18 AM  Result Value Ref Range  Free T4 0.80 0.61 - 1.12 ng/dL    Comment: Performed at [facility]  CBC with Differential/Platelet     Status: None   Collection Time: 03/07/16  4:18 AM  Result Value Ref Range   WBC 4.4 4.0 - 10.5 K/uL   RBC 4.38 3.87 - 5.11 MIL/uL   Hemoglobin 13.3 12.0 - 15.0 g/dL   HCT 36.9 36.0 - 46.0 %   MCV 84.2 78.0 - 100.0 fL   MCH 30.4 26.0 - 34.0 pg   MCHC 36.0 30.0 - 36.0 g/dL   RDW 12.4 11.5 - 15.5 %   Platelets 150 150 - 400 K/uL   Neutrophils Relative % 45 %   Neutro Abs 2.0 1.7 - 7.7 K/uL   Lymphocytes Relative 32 %   Lymphs Abs 1.4 0.7 - 4.0 K/uL   Monocytes Relative 23 %   Monocytes  Absolute 1.0 0.1 - 1.0 K/uL   Eosinophils Relative 0 %   Eosinophils Absolute 0.0 0.0 - 0.7 K/uL   Basophils Relative 0 %   Basophils Absolute 0.0 0.0 - 0.1 K/uL  Comprehensive metabolic panel     Status: Abnormal   Collection Time: 03/07/16  4:18 AM  Result Value Ref Range   Sodium 134 (L) 135 - 145 mmol/L   Potassium 2.9 (L) 3.5 - 5.1 mmol/L    Comment: DELTA CHECK NOTED REPEATED TO VERIFY NO VISIBLE HEMOLYSIS    Chloride 99 (L) 101 - 111 mmol/L   CO2 27 22 - 32 mmol/L   Glucose, Bld 100 (H) 65 - 99 mg/dL   BUN 10 6 - 20 mg/dL   Creatinine, Ser 0.64 0.44 - 1.00 mg/dL   Calcium 7.9 (L) 8.9 - 10.3 mg/dL   Total Protein 5.9 (L) 6.5 - 8.1 g/dL   Albumin 3.2 (L) 3.5 - 5.0 g/dL   AST 43 (H) 15 - 41 U/L   ALT 17 14 - 54 U/L   Alkaline Phosphatase 41 38 - 126 U/L   Total Bilirubin 0.2 (L) 0.3 - 1.2 mg/dL   GFR calc non Af Amer >60 >60 mL/min   GFR calc Af Amer >60 >60 mL/min    Comment: (NOTE) The eGFR has been calculated using the CKD EPI equation. This calculation has not been validated in all clinical situations. eGFR's persistently <60 mL/min signify possible Chronic Kidney Disease.    Anion gap 8 5 - 15  Magnesium     Status: Abnormal   Collection Time: 03/07/16  4:18 AM  Result Value Ref Range   Magnesium 1.4 (L) 1.7 - 2.4 mg/dL    Current Facility-Administered Medications  Medication Dose Route Frequency Provider Last Rate Last Dose  . albuterol (PROVENTIL) (2.5 MG/3ML) 0.083% nebulizer solution 2.5 mg  2.5 mg Nebulization Q2H PRN Rondell A Smith, MD      . atenolol (TENORMIN) tablet 100 mg  100 mg Oral BID Rondell A Smith, MD   100 mg at 03/07/16 0910  . azithromycin (ZITHROMAX) 500 mg in dextrose 5 % 250 mL IVPB  500 mg Intravenous Q24H Rondell A Smith, MD      . benztropine (COGENTIN) tablet 0.5 mg  0.5 mg Oral Daily Rondell A Smith, MD   0.5 mg at 03/07/16 0912  . cefTRIAXone (ROCEPHIN) 1 g in dextrose 5 % 50 mL IVPB  1 g Intravenous Q24H Rondell A Smith, MD       . chlorthalidone (HYGROTON) tablet 25 mg  25 mg Oral BID Rondell A Smith, MD     25 mg at 03/07/16 0912  . diazepam (VALIUM) tablet 2 mg  2 mg Oral Q12H PRN Rondell A Smith, MD   2 mg at 03/07/16 0359  . enoxaparin (LOVENOX) injection 40 mg  40 mg Subcutaneous QHS Rondell A Smith, MD   40 mg at 03/06/16 2338  . guaiFENesin (MUCINEX) 12 hr tablet 600 mg  600 mg Oral BID Rondell A Smith, MD   600 mg at 03/07/16 0912  . hydrOXYzine (ATARAX/VISTARIL) tablet 25 mg  25 mg Oral TID PRN Rondell A Smith, MD      . ipratropium (ATROVENT) nebulizer solution 0.5 mg  0.5 mg Nebulization Q2H PRN Rondell A Smith, MD      . ipratropium-albuterol (DUONEB) 0.5-2.5 (3) MG/3ML nebulizer solution 3 mL  3 mL Nebulization Q6H Rondell A Smith, MD   3 mL at 03/07/16 0830  . levothyroxine (SYNTHROID, LEVOTHROID) tablet 100 mcg  100 mcg Oral QAC breakfast Rondell A Smith, MD   100 mcg at 03/07/16 0912  . liothyronine (CYTOMEL) tablet 12.5 mcg  12.5 mcg Oral BID Rondell A Smith, MD   12.5 mcg at 03/07/16 0912  . lisinopril (PRINIVIL,ZESTRIL) tablet 20 mg  20 mg Oral BID Rondell A Smith, MD   20 mg at 03/07/16 0913  . magnesium sulfate IVPB 2 g 50 mL  2 g Intravenous Once Shanker M Ghimire, MD   2 g at 03/07/16 1008  . ondansetron (ZOFRAN) injection 4 mg  4 mg Intravenous Q6H PRN Laura A Harduk, PA-C   4 mg at 03/07/16 1008  . PARoxetine (PAXIL) tablet 40 mg  40 mg Oral Daily Rondell A Smith, MD   40 mg at 03/07/16 0911  . pneumococcal 23 valent vaccine (PNU-IMMUNE) injection 0.5 mL  0.5 mL Intramuscular Tomorrow-1000 Erin Schlossman, MD      . potassium chloride SA (K-DUR,KLOR-CON) CR tablet 40 mEq  40 mEq Oral BID Shanker M Ghimire, MD   40 mEq at 03/07/16 0912  . pravastatin (PRAVACHOL) tablet 80 mg  80 mg Oral q1800 Rondell A Smith, MD      . thiothixene (NAVANE) capsule 4 mg  4 mg Oral QHS Rondell A Smith, MD   4 mg at 03/06/16 2340  . zolpidem (AMBIEN) tablet 5 mg  5 mg Oral QHS PRN Rondell A Smith, MD         Musculoskeletal: Strength & Muscle Tone: decreased Gait & Station: unable to stand Patient leans: N/A  Psychiatric Specialty Exam: ROS increased depression, anxiety, paranoia, gastrointestinal disturbance and productive cough and also psychosocial stresses like separating from her husband and in the process of getting divorced. No Fever-chills, No Headache, No changes with Vision or hearing, reports vertigo No problems swallowing food or Liquids, No Chest pain, Cough or Shortness of Breath, No Abdominal pain, No Nausea or Vommitting, Bowel movements are regular, No Blood in stool or Urine, No dysuria, No new skin rashes or bruises, No new joints pains-aches,  No new weakness, tingling, numbness in any extremity, No recent weight gain or loss, No polyuria, polydypsia or polyphagia,   A full 10 point Review of Systems was done, except as stated above, all other Review of Systems were negative.   Blood pressure 160/67, pulse 63, temperature 98.4 F (36.9 C), temperature source Oral, resp. rate 18, height 5' 2" (1.575 m), weight 66.361 kg (146 lb 4.8 oz), SpO2 94 %.Body mass index is 26.75 kg/(m^2).  General Appearance: Bizarre and Guarded  Eye Contact::  Good  Speech:    Clear and Coherent and Slow  Volume:  Decreased  Mood:  Depressed and Hopeless  Affect:  Constricted and Depressed  Thought Process:  Coherent and Goal Directed  Orientation:  Full (Time, Place, and Person)  Thought Content:  Paranoid Ideation and Rumination  Suicidal Thoughts:  Yes.  with intent/plan  Homicidal Thoughts:  No  Memory:  Immediate;   Good Recent;   Poor Remote;   Poor  Judgement:  Impaired  Insight:  Fair  Psychomotor Activity:  Decreased  Concentration:  Poor  Recall:  Poor  Fund of Knowledge:Fair  Language: Fair  Akathisia:  Negative  Handed:  Right  AIMS (if indicated):     Assets:  Communication Skills Desire for Improvement Financial Resources/Insurance Housing Leisure  Time Social Support Transportation  ADL's:  Impaired  Cognition: Impaired,  Mild  Sleep:      Treatment Plan Summary: Daily contact with patient to assess and evaluate symptoms and progress in treatment and Medication management Continue safety sitter as patient cannot contract for safety at this time Continue home medication except trazodone because of recent overdose Refer to the unit LCSW regarding psychiatric placement when medically stable.  Disposition: Recommend psychiatric Inpatient admission when medically cleared. Supportive therapy provided about ongoing stressors.  ,JANARDHAHA R., MD 03/07/2016 10:38 AM  

## 2016-03-07 NOTE — Care Management Important Message (Signed)
Important Message  Patient Details  Name: Audrey Meyer MRN: [id number] Date of Birth: 02-Jul-1956   Medicare Important Message Given:  Yes    Purcell Mouton, RN 03/07/2016, 2:36 PM

## 2016-03-07 NOTE — H&P (Addendum)
[facility] History and Physical  Audrey Meyer [id number] DOB: 1956-04-07 DOA: 03/06/2016  Referring physician: ED PCP: Dwan Bolt, MD   Chief Complaint: Overdose  HPI:  Audrey Meyer is a 60 year old female with past medical history significant for hypothyroidism, HLD, GERD, COPD, HTN, schizoaffective disorder, and breast cancer(in remission); who presents after her estranged husband called 10 for the patient reportedly taking 50 tablets of trazodone around 4 AM this morning. She was not noted to have ingested any alcohol or any other medicines. Patient was noted to have tried overdosing placed back 2002. Patient is seen at [facility] facilities for her mental health issues. She had just actually been seen in the emergency department yesterday complaining of nausea for the last 3 days with increased diarrhea, and persistent productive cough. Chest x-ray performed yesterday showed signs of by basilar opacities suggestive of a possible pneumonia. She was seen to have hypokalemia of 2.4 at that time for which they tried to give her potassium,  but left AMA prior to receiving any potassium supplements. Patient has not been taking her home potassium supplements as prescribed and unsure of other medications. Upon admission patient was seen to have a oxygen saturations of 86% on room air which she required nasal cannula oxygen of 2 L to improve O2 sats greater than 92%.  Review of Systems  Constitutional: Positive for malaise/fatigue. Negative for fever and chills.       Decreased appetite  HENT: Negative for hearing loss and tinnitus.   Eyes: Negative for double vision and photophobia.  Respiratory: Positive for cough, sputum production, shortness of breath and wheezing. Negative for hemoptysis.   Cardiovascular: Negative for chest pain and leg swelling.  Gastrointestinal: Positive for nausea and diarrhea. Negative for vomiting and abdominal pain.  Genitourinary: Negative for urgency and  frequency.  Musculoskeletal: Positive for myalgias. Negative for falls.  Skin: Negative for itching and rash.  Neurological: Negative for sensory change and speech change.  Endo/Heme/Allergies: Negative for environmental allergies. Does not bruise/bleed easily.  Psychiatric/Behavioral: Positive for depression, suicidal ideas and substance abuse.      Past Medical History  Diagnosis Date  . Hypothyroid   . High cholesterol   . Acid reflux   . COPD (chronic obstructive pulmonary disease) ([place])   . Hypertension   . Schizoaffective disorder   . Breast cancer [facility])      Past Surgical History  Procedure Laterality Date  . Mastectomy, partial    . Appendectomy        Social History:  reports that she has been smoking Cigarettes and Cigars.  She has been smoking about 1.00 pack per day. She does not have any smokeless tobacco history on file. She reports that she does not drink alcohol or use illicit drugs. Where does patient live--home  and with whom if at home? alone Can patient participate in ADLs? Yes  Allergies  Allergen Reactions  . Budesonide Shortness Of Breath    Family History  Problem Relation Age of Onset  . Heart attack Father   . Cancer Other         Prior to Admission medications   Medication Sig Start Date End Date Taking? Authorizing Provider  albuterol (PROVENTIL HFA;VENTOLIN HFA) 108 (90 BASE) MCG/ACT inhaler Inhale 2 puffs into the lungs every 6 (six) hours as needed. For shortness of breath   Yes Historical Provider, MD  atenolol-chlorthalidone (TENORETIC) 100-25 MG per tablet Take 1 tablet by mouth 2 (two) times daily.    Yes  Historical Provider, MD  benztropine (COGENTIN) 0.5 MG tablet Take 0.5 mg by mouth daily. Reported on 03/06/2016   Yes Historical Provider, MD  dextromethorphan (DELSYM) 30 MG/5ML liquid Take 30 mg by mouth as needed for cough.   Yes Historical Provider, MD  diazepam (VALIUM) 5 MG tablet Take 5 mg by mouth every 12 (twelve) hours  as needed. For anxiety   Yes Historical Provider, MD  hydrOXYzine (VISTARIL) 25 MG capsule Take 25 mg by mouth 3 (three) times daily as needed for anxiety.   Yes Historical Provider, MD  levothyroxine (SYNTHROID, LEVOTHROID) 100 MCG tablet Take 100 mcg by mouth daily.   Yes Historical Provider, MD  liothyronine (CYTOMEL) 25 MCG tablet Take 12.5 mcg by mouth 2 (two) times daily.    Yes Historical Provider, MD  lisinopril (PRINIVIL,ZESTRIL) 20 MG tablet Take 20 mg by mouth 2 (two) times daily.    Yes Historical Provider, MD  Melatonin 10 MG CAPS Take 10 mg by mouth at bedtime.   Yes Historical Provider, MD  PARoxetine (PAXIL) 40 MG tablet Take 40 mg by mouth every morning.   Yes Historical Provider, MD  potassium chloride SA (K-DUR,KLOR-CON) 20 MEQ tablet Take 20 mEq by mouth 2 (two) times daily.   Yes Historical Provider, MD  pravastatin (PRAVACHOL) 80 MG tablet Take 80 mg by mouth daily.   Yes Historical Provider, MD  pseudoephedrine-guaifenesin (MUCINEX D) 60-600 MG 12 hr tablet Take 2 tablets by mouth daily as needed for congestion.   Yes Historical Provider, MD  thiothixene (NAVANE) 2 MG capsule Take 4 mg by mouth at bedtime.   Yes Historical Provider, MD  traZODone (DESYREL) 100 MG tablet Take 2 tablets (200 mg total) by mouth at bedtime. 10/12/13  Yes Hazel Sams, PA-C     Physical Exam: Filed Vitals:   03/06/16 1750 03/06/16 2010 03/06/16 2108 03/06/16 2119  BP: 148/75 139/68  145/70  Pulse: 81 75  87  Temp: 99 F (37.2 C) 98.9 F (37.2 C)    TempSrc: Oral Oral    Resp: 19 16  16   Height:    5\' 2"  (1.575 m)  Weight:    66.361 kg (146 lb 4.8 oz)  SpO2: 89% 96% 99% 96%    Constitutional: Vital signs reviewed. Patient is a Female who appears acutely ill, but not toxic. Cooperative with exam. Head: Normocephalic and atraumatic  Ear: TM normal bilaterally  Mouth: no erythema or exudates, MMM  Eyes: PERRL, EOMI, conjunctivae normal, No scleral icterus.  Neck: Supple, Trachea  midline normal ROM, No JVD, mass, thyromegaly, or carotid bruit present.  Cardiovascular: RRR, S1 normal, S2 normal, no MRG, pulses symmetric and intact bilaterally  Pulmonary/Chest: Bilateral wheezes appreciated in both lung fields Abdominal: Soft. Non-tender, non-distended, bowel sounds are normal, no masses, organomegaly, or guarding present.  GU: no CVA tenderness Musculoskeletal: No joint deformities, erythema, or stiffness, ROM full and no nontender Ext: no edema and no cyanosis, pulses palpable bilaterally (DP and PT)  Hematology: no cervical, inginal, or axillary adenopathy.  Neurological: A&O x3, Strenght is normal and symmetric bilaterally, cranial nerve II-XII are grossly intact, no focal motor deficit, sensory intact to light touch bilaterally.  Skin: Warm, dry and intact. No rash, cyanosis, or clubbing.  Psychiatric: Sad/depressed mood and affect     Data Review   Micro Results No results found for this or any previous visit (from the past 240 hour(s)).  Radiology Reports Dg Chest 2 View  03/06/2016  CLINICAL DATA:  Hypoxia  EXAM: CHEST  2 VIEW COMPARISON:  03/06/2016 FINDINGS: Cardiac shadow is within normal limits. The lungs are well aerated bilaterally. Persistent bibasilar changes are noted which may represent early infiltrate. The overall appearance is stable from this study obtained earlier in the same day. IMPRESSION: Persistent bibasilar changes. Electronically Signed   By: Inez Catalina M.D.   On: 03/06/2016 19:18   Dg Chest 2 View  03/06/2016  CLINICAL DATA:  Acute onset of worsening nausea, diarrhea and cough. Initial encounter. EXAM: CHEST  2 VIEW COMPARISON:  Chest radiograph performed 10/15/2012 FINDINGS: The lungs are well-aerated. Mild peribronchial thickening is noted. Minimal bibasilar opacities could reflect mild pneumonia, depending on the patient's symptoms. There is no evidence of pleural effusion or pneumothorax. The heart is borderline normal in size. No  acute osseous abnormalities are seen. IMPRESSION: Minimal bibasilar opacities could reflect mild pneumonia, depending on the patient's symptoms. Mild peribronchial thickening noted. Electronically Signed   By: Garald Balding M.D.   On: 03/06/2016 03:35     CBC  Recent Labs Lab 03/06/16 0402 03/06/16 1920 03/06/16 2048  WBC 4.3 3.4*  --   HGB 14.9 13.6 13.6  HCT 41.3 37.8 40.0  PLT 216 174  --   MCV 85.7 83.6  --   MCH 30.9 30.1  --   MCHC 36.1* 36.0  --   RDW 12.4 12.4  --   LYMPHSABS 1.2 0.5*  --   MONOABS 0.8 0.5  --   EOSABS 0.0 0.0  --   BASOSABS 0.0 0.0  --     Chemistries   Recent Labs Lab 03/06/16 0402 03/06/16 1920 03/06/16 2048  NA 135 131* 131*  K 2.5* 2.4* 2.4*  CL 92* 91* 86*  CO2 31 29  --   GLUCOSE 110* 168* 128*  BUN 12 15 14   CREATININE 0.78 0.85 0.70  CALCIUM 9.4 8.7*  --   AST  --  48*  --   ALT  --  21  --   ALKPHOS  --  48  --   BILITOT  --  0.3  --    ------------------------------------------------------------------------------------------------------------------ estimated creatinine clearance is 65.1 mL/min (by C-G formula based on Cr of 0.7). ------------------------------------------------------------------------------------------------------------------ No results for input(s): HGBA1C in the last 72 hours. ------------------------------------------------------------------------------------------------------------------ No results for input(s): CHOL, HDL, LDLCALC, TRIG, CHOLHDL, LDLDIRECT in the last 72 hours. ------------------------------------------------------------------------------------------------------------------ No results for input(s): TSH, T4TOTAL, T3FREE, THYROIDAB in the last 72 hours.  Invalid input(s): FREET3 ------------------------------------------------------------------------------------------------------------------ No results for input(s): VITAMINB12, FOLATE, FERRITIN, TIBC, IRON, RETICCTPCT in the last 72  hours.  Coagulation profile No results for input(s): INR, PROTIME in the last 168 hours.  No results for input(s): DDIMER in the last 72 hours.  Cardiac Enzymes No results for input(s): CKMB, TROPONINI, MYOGLOBIN in the last 168 hours.  Invalid input(s): CK ------------------------------------------------------------------------------------------------------------------ Invalid input(s): POCBNP   CBG: No results for input(s): GLUCAP in the last 168 hours.     EKG: Independently reviewed. Normal sinus rhythm   Assessment/Plan Intentional Overdose: Patient reportedly took 50 tablets of trazodone and attempts to harm herself at 4 AM. UDS was positive for amphetamines and opioids. Unknown where amphetamines on drug screen came from. EKG within normal limits. - Admit to telemetry - Sitter to bedside - Social work consult - Will need to consulted TTS when medically stable  Suspected CAP (community acquired pneumonia) Patient with persistent  Productive cough and x-ray showing bibasilar opacities with the question of possible pneumonia. - Empiric antibiotics of Rocephin  and azithromycin  - Mucinex  - Follow-up cultures  COPD exacerbation with hypoxia: Patient's O2 sats 86% on room air. Bilateral wheezes appreciated and currently not reported to be on home oxygen. He reports having a albuterol inhaler at home. - Continuous pulse oximetry with nasal cannula oxygen and keep O2 sats greater than 92% - DuoNeb's q 6hrs and 2hrs prn sob - Reported allergy to methylprednisolone  Hypokalemia:  potassium 2.4 on admission. No overt EKG changes. Given 40 mEq of potassium chloride in the ED. - Additional dose of 40 mEq of potassium chloride ordered - Continue home potassium chloride dose  - Continue to monitor and replace as needed  Hypothyroidism - Check free T3 and T4 - Continue cytomel and levothyroxine  Essential hypertension -Continue lisinopril and Temoretic  Schizoaffective  disorder/anxiety - Continue Paxil, navane, Cogentin, Hydroxyzine prn, Valium( dose reduced, may need to taper off) prn  Insomnia - Continue melatonin - Trazodone held secondary to overdose  Hyperlipidemia - Continue pravastatin  Tobacco abuse: Patient reports smoking 1 pack cigarettes per day: - Will need counseling on cessation of smoking   History of breast cancer  Code Status:   full Family Communication: bedside Disposition Plan: admit   Total time spent 55 minutes.Greater than 50% of this time was spent in counseling, explanation of diagnosis, planning of further management, and coordination of care  [facility] Pager [phone]  If 7PM-7AM, please contact night-coverage [email or web address] Password [email or web address] 03/07/2016, 1:53 AM

## 2016-03-07 NOTE — Progress Notes (Addendum)
PATIENT DETAILS Name: Audrey Meyer Age: 60 y.o. Sex: female Date of Birth: [birth year] Admit Date: 03/06/2016 Admitting Physician Norval Morton, MD [id number] DENNIS, MD  Subjective: Awake, alert. Main complaint is diarrhea. Denies any cough. Denies any shortness of breath.  Assessment/Plan: Principal Problem: Trazodone Overdose: Suicidal attempt with trazodone->24 hours back . Continue to monitor in telemetry, no major arrhythmias seen. QTC significantly better. Psychiatry consulted.  Active Problems: Hypokalemia: Secondary to GI loss and diuretics-complains of chronic diarrhea up to 10 bowel movements a day. Replete potassium and magnesium, recheck in a.m.  ? Community acquired pneumonia: Doubt community acquired pneumonia-discontinue antibiotics. Monitor off antibiotics.  Mild COPD exacerbation: Lungs currently clear, continue bronchodilators. No role for steroids or antibiotics.  Diarrhea: Claims to be a chronic issue-apparently has been on steroids in the past. We will touch base with patient's primary gastroenterologist before proceeding with any further workup.  Addendum: Spoke with Dr. Debbora Lacrosse has a history of microscopic colitis has done well on 20 mg of prednisone in the past. He suggested putting her back on prednisone, we'll also check C. difficile PCR.  Hypothyroidism: Continue Synthroid and Cytomel  Hypertension: Uncontrolled-given severe hypokalemia. Chlorthalidone. Continue atenolol, lisinopril, and Aldactone.  Dyslipidemia: Continue statin.  History of depression/schizoaffective disorder: Psych consulted given suicidal attempt with trazodone-continue Paxil and thiotixene  Tobacco abuse: Counseled.  Disposition: Remain inpatient  Antimicrobial agents  See below  Anti-infectives    Start     Dose/Rate Route Frequency Ordered Stop   03/07/16 2100  azithromycin (ZITHROMAX) 500 mg in dextrose 5 % 250 mL IVPB     500 mg 250  mL/hr over 60 Minutes Intravenous Every 24 hours 03/07/16 0614     03/07/16 2000  cefTRIAXone (ROCEPHIN) 1 g in dextrose 5 % 50 mL IVPB     1 g 100 mL/hr over 30 Minutes Intravenous Every 24 hours 03/07/16 0614     03/06/16 2015  cefTRIAXone (ROCEPHIN) 1 g in dextrose 5 % 50 mL IVPB     1 g 100 mL/hr over 30 Minutes Intravenous  Once 03/06/16 2000 03/06/16 2104   03/06/16 2015  azithromycin (ZITHROMAX) 500 mg in dextrose 5 % 250 mL IVPB     500 mg 250 mL/hr over 60 Minutes Intravenous  Once 03/06/16 2000 03/06/16 2204      DVT Prophylaxis: Prophylactic Lovenox   Code Status: Full code  Family Communication None at bedside  Procedures: None  CONSULTS:  psychiatry  Time spent 30 minutes-Greater than 50% of this time was spent in counseling, explanation of diagnosis, planning of further management, and coordination of care.  MEDICATIONS: Scheduled Meds: . atenolol  100 mg Oral BID  . azithromycin  500 mg Intravenous Q24H  . benztropine  0.5 mg Oral Daily  . cefTRIAXone (ROCEPHIN)  IV  1 g Intravenous Q24H  . chlorthalidone  25 mg Oral BID  . enoxaparin (LOVENOX) injection  40 mg Subcutaneous QHS  . guaiFENesin  600 mg Oral BID  . ipratropium-albuterol  3 mL Nebulization Q6H  . levothyroxine  100 mcg Oral QAC breakfast  . liothyronine  12.5 mcg Oral BID  . lisinopril  20 mg Oral BID  . PARoxetine  40 mg Oral Daily  . pneumococcal 23 valent vaccine  0.5 mL Intramuscular Tomorrow-1000  . potassium chloride  40 mEq Oral BID  . pravastatin  80 mg Oral q1800  . thiothixene  4 mg  Oral QHS   Continuous Infusions:  PRN Meds:.albuterol, diazepam, hydrOXYzine, ipratropium, ondansetron (ZOFRAN) IV, zolpidem    PHYSICAL EXAM: Vital signs in last 24 hours: Filed Vitals:   03/06/16 2119 03/07/16 0513 03/07/16 0655 03/07/16 0831  BP: 145/70 187/72 160/67   Pulse: 87 66 63   Temp:  98.4 F (36.9 C)    TempSrc:  Oral    Resp: 16 18    Height: 5\' 2"  (1.575 m)     Weight:  66.361 kg (146 lb 4.8 oz)     SpO2: 96% 98%  94%    Weight change:  Filed Weights   03/06/16 2119  Weight: 66.361 kg (146 lb 4.8 oz)   Body mass index is 26.75 kg/(m^2).   Gen Exam: Awake and alert with clear speech.   Neck: Supple, No JVD.   Chest: B/L Clear.   CVS: S1 S2 Regular, no murmurs.  Abdomen: soft, BS +, non tender, non distended.  Extremities: no edema, lower extremities warm to touch. Neurologic: Non Focal.   Skin: No Rash.   Wounds: N/A.    Intake/Output from previous day:  Intake/Output Summary (Last 24 hours) at 03/07/16 1140 Last data filed at 03/07/16 0933  Gross per 24 hour  Intake   1230 ml  Output      0 ml  Net   1230 ml     LAB RESULTS: CBC  Recent Labs Lab 03/06/16 0402 03/06/16 1920 03/06/16 2048 03/07/16 0418  WBC 4.3 3.4*  --  4.4  HGB 14.9 13.6 13.6 13.3  HCT 41.3 37.8 40.0 36.9  PLT 216 174  --  150  MCV 85.7 83.6  --  84.2  MCH 30.9 30.1  --  30.4  MCHC 36.1* 36.0  --  36.0  RDW 12.4 12.4  --  12.4  LYMPHSABS 1.2 0.5*  --  1.4  MONOABS 0.8 0.5  --  1.0  EOSABS 0.0 0.0  --  0.0  BASOSABS 0.0 0.0  --  0.0    Chemistries   Recent Labs Lab 03/06/16 0402 03/06/16 1920 03/06/16 2048 03/07/16 0418  NA 135 131* 131* 134*  K 2.5* 2.4* 2.4* 2.9*  CL 92* 91* 86* 99*  CO2 31 29  --  27  GLUCOSE 110* 168* 128* 100*  BUN 12 15 14 10   CREATININE 0.78 0.85 0.70 0.64  CALCIUM 9.4 8.7*  --  7.9*  MG  --   --   --  1.4*    CBG: No results for input(s): GLUCAP in the last 168 hours.  GFR Estimated Creatinine Clearance: 65.1 mL/min (by C-G formula based on Cr of 0.64).  Coagulation profile No results for input(s): INR, PROTIME in the last 168 hours.  Cardiac Enzymes No results for input(s): CKMB, TROPONINI, MYOGLOBIN in the last 168 hours.  Invalid input(s): CK  Invalid input(s): POCBNP No results for input(s): DDIMER in the last 72 hours. No results for input(s): HGBA1C in the last 72 hours. No results for input(s):  CHOL, HDL, LDLCALC, TRIG, CHOLHDL, LDLDIRECT in the last 72 hours. No results for input(s): TSH, T4TOTAL, T3FREE, THYROIDAB in the last 72 hours.  Invalid input(s): FREET3 No results for input(s): VITAMINB12, FOLATE, FERRITIN, TIBC, IRON, RETICCTPCT in the last 72 hours.  Recent Labs  03/06/16 1920  LIPASE 24    Urine Studies No results for input(s): UHGB, CRYS in the last 72 hours.  Invalid input(s): UACOL, UAPR, USPG, UPH, UTP, UGL, UKET, UBIL, UNIT, UROB, ULEU, UEPI,  UWBC, URBC, UBAC, CAST, UCOM, BILUA  MICROBIOLOGY: No results found for this or any previous visit (from the past 240 hour(s)).  RADIOLOGY STUDIES/RESULTS: Dg Chest 2 View  03/06/2016  CLINICAL DATA:  Hypoxia EXAM: CHEST  2 VIEW COMPARISON:  03/06/2016 FINDINGS: Cardiac shadow is within normal limits. The lungs are well aerated bilaterally. Persistent bibasilar changes are noted which may represent early infiltrate. The overall appearance is stable from this study obtained earlier in the same day. IMPRESSION: Persistent bibasilar changes. Electronically Signed   By: Inez Catalina M.D.   On: 03/06/2016 19:18   Dg Chest 2 View  03/06/2016  CLINICAL DATA:  Acute onset of worsening nausea, diarrhea and cough. Initial encounter. EXAM: CHEST  2 VIEW COMPARISON:  Chest radiograph performed 10/15/2012 FINDINGS: The lungs are well-aerated. Mild peribronchial thickening is noted. Minimal bibasilar opacities could reflect mild pneumonia, depending on the patient's symptoms. There is no evidence of pleural effusion or pneumothorax. The heart is borderline normal in size. No acute osseous abnormalities are seen. IMPRESSION: Minimal bibasilar opacities could reflect mild pneumonia, depending on the patient's symptoms. Mild peribronchial thickening noted. Electronically Signed   By: Garald Balding M.D.   On: 03/06/2016 03:35    Oren Binet, MD  [facility] Pager:[phone]  If 7PM-7AM, please contact  night-coverage [email or web address] Password [email or web address] 03/07/2016, 11:40 AM   LOS: 1 day

## 2016-03-08 LAB — BASIC METABOLIC PANEL
Anion gap: 10 (ref 5–15)
BUN: 9 mg/dL (ref 6–20)
CALCIUM: 8.4 mg/dL — AB (ref 8.9–10.3)
CO2: 28 mmol/L (ref 22–32)
CREATININE: 0.62 mg/dL (ref 0.44–1.00)
Chloride: 94 mmol/L — ABNORMAL LOW (ref 101–111)
GFR calc non Af Amer: >60 mL/min (ref >60)
GLUCOSE: 92 mg/dL (ref 65–99)
Potassium: 2.9 mmol/L — ABNORMAL LOW (ref 3.5–5.1)
Sodium: 132 mmol/L — ABNORMAL LOW (ref 135–145)

## 2016-03-08 LAB — LEGIONELLA PNEUMOPHILA SEROGP 1 UR AG: L. PNEUMOPHILA SEROGP 1 UR AG: NEGATIVE

## 2016-03-08 LAB — T3, FREE: T3, Free: 2.3 pg/mL (ref 2.0–4.4)

## 2016-03-08 LAB — MAGNESIUM: MAGNESIUM: 1.7 mg/dL (ref 1.7–2.4)

## 2016-03-08 MED ORDER — POTASSIUM CHLORIDE 10 MEQ/100ML IV SOLN
10.0000 meq | INTRAVENOUS | Status: AC
  Administered 2016-03-08 (×3): 10 meq via INTRAVENOUS
  Filled 2016-03-08 (×3): qty 100

## 2016-03-08 MED ORDER — ALBUTEROL SULFATE (2.5 MG/3ML) 0.083% IN NEBU
2.5000 mg | INHALATION_SOLUTION | RESPIRATORY_TRACT | Status: DC | PRN
  Administered 2016-03-10 – 2016-03-11 (×2): 2.5 mg via RESPIRATORY_TRACT
  Filled 2016-03-08 (×2): qty 3

## 2016-03-08 MED ORDER — POTASSIUM CHLORIDE CRYS ER 20 MEQ PO TBCR
40.0000 meq | EXTENDED_RELEASE_TABLET | Freq: Two times a day (BID) | ORAL | Status: AC
Start: 2016-03-08 — End: 2016-03-08
  Administered 2016-03-08 (×2): 40 meq via ORAL
  Filled 2016-03-08 (×2): qty 2

## 2016-03-08 MED ORDER — HYDROCODONE-ACETAMINOPHEN 5-325 MG PO TABS
2.0000 | ORAL_TABLET | Freq: Once | ORAL | Status: AC
  Administered 2016-03-08: 2 via ORAL
  Filled 2016-03-08: qty 2

## 2016-03-08 MED ORDER — MAGNESIUM SULFATE 2 GM/50ML IV SOLN
2.0000 g | Freq: Once | INTRAVENOUS | Status: AC
  Administered 2016-03-08: 2 g via INTRAVENOUS
  Filled 2016-03-08: qty 50

## 2016-03-08 NOTE — Progress Notes (Signed)
[facility] PROGRESS NOTE  Audrey Meyer [id number] DOB: [birth year] DOA: 03/06/2016 PCP: Dwan Bolt, MD  Brief narrative: 60 year old female with history of microscopic colitis and chronic diarrhea admitted with drug overdose with trazodone. Continues to have severe hypokalemia-in spite of aggressive potassium supplementation he did hypokalemia is likely secondary to ongoing diarrhea. Seen by psychiatry, plans are to discharge to inpatient psych facility when hypokalemia and diarrhea improved.  Assessment/Plan: Trazodone overdose: Suicidal attempt with trazodone >48 hours prior. Continue on telemetry, no major arrhythmias seen. Psychiatry recommends patient be transferred to behavioral health once medically stable.  Hypokalemia: Secondary to GI loss from diarrhea and chlorthalidone, potassium stable at 2.9. Will use Aldactone for HTN, replete potassium and magnesium, and recheck in am.   Diarrhea: History of microscopic colitis. After speaking with Dr. Benson Norway- prednisone 20mg  started-and diarrhea significantly improved-per patient , she did not have to use the bathroom as many times last night. C.diff negative. Will continue 20mg  prednisone and monitor.  Mild COPD exacerbation: Lungs clear, continue bronchodilators.   Hypothyroidism: Continue Synthroid and Cytomel  Hypertension:  Relatively well-controlled- Continue Atenolol, Lisinopril, and Aldactone-followed adjust accordingly   Dyslipidemia: Continue statin  History of depression/schizoaffective disorder: Psych consulted, continue Paxil and thiotixene.  Code Status: Full Family Communication: None at bedside Disposition Plan: Remain inpatient, behavioral health when medically stable   Consultants:  None  Procedures:  None  Antibiotics:  None    HPI/Subjective: Feels much better and had less episodes of diarrhea last night. Denies shortness of breath.   Objective: Filed Vitals:   03/07/16 2046  03/08/16 0447  BP: 139/65 155/77  Pulse: 58 68  Temp: 97.9 F (36.6 C) 97.9 F (36.6 C)  Resp: 14 18    Intake/Output Summary (Last 24 hours) at 03/08/16 0831 Last data filed at 03/07/16 2143  Gross per 24 hour  Intake    610 ml  Output    200 ml  Net    410 ml   Filed Weights   03/06/16 2119  Weight: 66.361 kg (146 lb 4.8 oz)    Exam:   General:  Awake, alert, with clear speech. Cooperative, answering questions appropriately.  Cardiovascular: RRR, no murmurs.  Respiratory: CTAB  Abdomen: soft, nontender, nondistended +BS  Extremities: No edema, warm to the touch.  Data Reviewed: Basic Metabolic Panel:  Recent Labs Lab 03/06/16 0402 03/06/16 1920 03/06/16 2048 03/07/16 0418 03/08/16 0446  NA 135 131* 131* 134* 132*  K 2.5* 2.4* 2.4* 2.9* 2.9*  CL 92* 91* 86* 99* 94*  CO2 31 29  --  27 28  GLUCOSE 110* 168* 128* 100* 92  BUN 12 15 14 10 9   CREATININE 0.78 0.85 0.70 0.64 0.62  CALCIUM 9.4 8.7*  --  7.9* 8.4*  MG  --   --   --  1.4* 1.7   Liver Function Tests:  Recent Labs Lab 03/06/16 1920 03/07/16 0418  AST 48* 43*  ALT 21 17  ALKPHOS 48 41  BILITOT 0.3 0.2*  PROT 7.2 5.9*  ALBUMIN 3.9 3.2*    Recent Labs Lab 03/06/16 1920  LIPASE 24   No results for input(s): AMMONIA in the last 168 hours. CBC:  Recent Labs Lab 03/06/16 0402 03/06/16 1920 03/06/16 2048 03/07/16 0418  WBC 4.3 3.4*  --  4.4  NEUTROABS 2.3 2.4  --  2.0  HGB 14.9 13.6 13.6 13.3  HCT 41.3 37.8 40.0 36.9  MCV 85.7 83.6  --  84.2  PLT 216 174  --  150   Cardiac Enzymes: No results for input(s): CKTOTAL, CKMB, CKMBINDEX, TROPONINI in the last 168 hours. BNP (last 3 results) No results for input(s): BNP in the last 8760 hours.  ProBNP (last 3 results) No results for input(s): PROBNP in the last 8760 hours.  CBG: No results for input(s): GLUCAP in the last 168 hours.  Recent Results (from the past 240 hour(s))  C difficile quick scan w PCR reflex     Status:  None   Collection Time: 03/07/16  2:56 PM  Result Value Ref Range Status   C Diff antigen NEGATIVE NEGATIVE Final   C Diff toxin NEGATIVE NEGATIVE Final   C Diff interpretation Negative for toxigenic C. difficile  Final     Studies: Dg Chest 2 View  03/06/2016  CLINICAL DATA:  Hypoxia EXAM: CHEST  2 VIEW COMPARISON:  03/06/2016 FINDINGS: Cardiac shadow is within normal limits. The lungs are well aerated bilaterally. Persistent bibasilar changes are noted which may represent early infiltrate. The overall appearance is stable from this study obtained earlier in the same day. IMPRESSION: Persistent bibasilar changes. Electronically Signed   By: Inez Catalina M.D.   On: 03/06/2016 19:18    Scheduled Meds: . atenolol  100 mg Oral BID  . benztropine  0.5 mg Oral Daily  . enoxaparin (LOVENOX) injection  40 mg Subcutaneous QHS  . guaiFENesin  600 mg Oral BID  . ipratropium-albuterol  3 mL Nebulization TID  . levothyroxine  100 mcg Oral QAC breakfast  . liothyronine  12.5 mcg Oral BID  . lisinopril  20 mg Oral BID  . magnesium sulfate 1 - 4 g bolus IVPB  2 g Intravenous Once  . PARoxetine  40 mg Oral Daily  . potassium chloride  10 mEq Intravenous Q1 Hr x 3  . potassium chloride  40 mEq Oral BID  . pravastatin  80 mg Oral q1800  . predniSONE  20 mg Oral Q breakfast  . spironolactone  25 mg Oral Daily  . thiothixene  4 mg Oral QHS   Continuous Infusions:   Principal Problem:   Overdose Active Problems:   CAP (community acquired pneumonia)   Hypokalemia   Hypothyroidism   Schizoaffective disorder ([place])   Essential hypertension   COPD exacerbation ([place])   Tobacco abuse   Time spent: 30 minutes  Kara Dies PA-S [facility]  If 7PM-7AM, please contact night-coverage at [email or web address], password [facility] 03/08/2016, 8:31 AM  LOS: 2 days    Attending MD note  Patient was seen, examined,treatment plan was discussed with the PA-S.  I have personally reviewed the clinical findings,  lab, imaging studies and management of this patient in detail. I agree with the documentation, as recorded by the PA-S.   Continues to do well-after being started on prednisone 20 mg diarrhea has significantly improved. Continues to have hypokalemia and hypomagnesemia that is being repleted. Chlorthalidone has been discontinued, patient now on Aldactone as it is potassium sparing.  Abdomen is soft and nontender Lungs are clear  Patient has been seen by psychiatry-plans are for inpatient psychiatric facility placement once hypokalemia and diarrhea is better.  Rest as documented above.  [facility]

## 2016-03-08 NOTE — Progress Notes (Addendum)
Assumed care of pt from [place], RN at 1500.  I agree with Misha's shift assessment.  Pt is in stable condition.  A/O x4.  Able to make needs known.  BP 159/63 mmHg  Pulse 69  Temp(Src) 97.4 F (36.3 C) (Oral)  Resp 18  Ht 5\' 2"  (1.575 m)  Wt 66.361 kg (146 lb 4.8 oz)  BMI 26.75 kg/m2  SpO2 97% on room air.  K+ and mag replaced today.  Pt with complaint of pain at IV site at handoff.  IV noted to be infiltrated.  IVF stopped, IV remove and warm pack applied.  Sitter at bedside.  Will continue to monitor and continue with current plan of care.    Spoke with Beth in pharmacy regarding infiltration of potassium.  She advises a warm pack, which has been applied. Will continue to monitor IV site for adverse effects.

## 2016-03-08 NOTE — Clinical Documentation Improvement (Signed)
Internal Medicine  Based on the clinical indicators  below, please clarify acuity and type of schizoaffective disorder.   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   Decreased appetite   Psychiatric: Sad/depressed mood and affect Schizoaffective disorder/anxiety - Continue Paxil, navane, Cogentin, Hydroxyzine prn, Valium( dose reduced, may need to taper off) prn  [id number] disorder Psychiatry consulted.   Please exercise your independent, professional judgment when responding. A specific answer is not anticipated or expected. Please update your documentation within the medical record to reflect your response to this query. Thank you  Thank You, [place] [phone]

## 2016-03-09 DIAGNOSIS — I10 Essential (primary) hypertension: Secondary | ICD-10-CM

## 2016-03-09 DIAGNOSIS — E039 Hypothyroidism, unspecified: Secondary | ICD-10-CM

## 2016-03-09 DIAGNOSIS — J438 Other emphysema: Secondary | ICD-10-CM

## 2016-03-09 DIAGNOSIS — T50902D Poisoning by unspecified drugs, medicaments and biological substances, intentional self-harm, subsequent encounter: Secondary | ICD-10-CM

## 2016-03-09 DIAGNOSIS — E876 Hypokalemia: Secondary | ICD-10-CM

## 2016-03-09 DIAGNOSIS — F259 Schizoaffective disorder, unspecified: Secondary | ICD-10-CM

## 2016-03-09 LAB — BASIC METABOLIC PANEL
Anion gap: 8 (ref 5–15)
BUN: 9 mg/dL (ref 6–20)
CO2: 28 mmol/L (ref 22–32)
CREATININE: 0.69 mg/dL (ref 0.44–1.00)
Calcium: 8.4 mg/dL — ABNORMAL LOW (ref 8.9–10.3)
Chloride: 94 mmol/L — ABNORMAL LOW (ref 101–111)
GFR calc non Af Amer: >60 mL/min (ref >60)
GLUCOSE: 100 mg/dL — AB (ref 65–99)
Potassium: 3.1 mmol/L — ABNORMAL LOW (ref 3.5–5.1)
Sodium: 130 mmol/L — ABNORMAL LOW (ref 135–145)

## 2016-03-09 LAB — MAGNESIUM: MAGNESIUM: 1.5 mg/dL — AB (ref 1.7–2.4)

## 2016-03-09 LAB — PHOSPHORUS: Phosphorus: 1.5 mg/dL — ABNORMAL LOW (ref 2.5–4.6)

## 2016-03-09 MED ORDER — POTASSIUM PHOSPHATES 15 MMOLE/5ML IV SOLN
20.0000 meq | Freq: Once | INTRAVENOUS | Status: AC
  Administered 2016-03-09: 20 meq via INTRAVENOUS
  Filled 2016-03-09: qty 4.55

## 2016-03-09 MED ORDER — MAGNESIUM SULFATE 2 GM/50ML IV SOLN
2.0000 g | Freq: Once | INTRAVENOUS | Status: AC
  Administered 2016-03-09: 2 g via INTRAVENOUS
  Filled 2016-03-09: qty 50

## 2016-03-09 MED ORDER — HYDROCODONE-ACETAMINOPHEN 5-325 MG PO TABS
1.0000 | ORAL_TABLET | Freq: Four times a day (QID) | ORAL | Status: DC | PRN
  Administered 2016-03-09 – 2016-03-14 (×14): 2 via ORAL
  Filled 2016-03-09 (×14): qty 2

## 2016-03-09 MED ORDER — POTASSIUM CHLORIDE CRYS ER 20 MEQ PO TBCR
40.0000 meq | EXTENDED_RELEASE_TABLET | Freq: Every day | ORAL | Status: DC
  Administered 2016-03-09: 40 meq via ORAL
  Filled 2016-03-09: qty 2

## 2016-03-09 NOTE — Progress Notes (Signed)
[facility] PROGRESS NOTE  Audrey Meyer [id number] DOB: 1956/10/22 DOA: 03/06/2016 PCP: Dwan Bolt, MD  Brief narrative: 60 year old female with history of microscopic colitis and chronic diarrhea admitted with drug overdose with trazodone. Continues to have severe hypokalemia-in spite of aggressive potassium supplementation he did hypokalemia is likely secondary to ongoing diarrhea. Seen by psychiatry, plans are to discharge to inpatient psych facility when hypokalemia and diarrhea improved.  Assessment/Plan: Trazodone overdose: Suicidal attempt with trazodone >72 hours prior to admission.  -Continue on telemetry (mainly for low K), no major arrhythmias seen.  -Psychiatry recommends patient to be transferred to behavioral health once medically stable. -will continue sitter at bedside and follow response   Hypokalemia, low phosphorus and hypomagnesemia : Secondary to GI loss from diarrhea and use of chlorthalidone for BP prior to admission. -Chlorthalidone discontinued -diarrhea has now improved -will continue electrolytes monitoring and repletion as needed -started on spironolactone and is on lisinopril   Diarrhea: History of microscopic colitis. After speaking with Dr. Benson Norway, recommendations was to start  prednisone 20mg  -Diarrhea has significantly improved. -C.diff negative.  -Will continue monitoring and after 10 days treatment initiate slow tapering.  COPD: stable -Lungs clear, continue bronchodilators. 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 -denies SI or  hallucinations currently -will continue continue Paxil and thiotixene.  Chronic leg pain -will use PRN vicodin -chronic and neuropathic in nature    Code Status: Full Family Communication: None at bedside Disposition Plan: Remain inpatient, plan is to transfer to behavioral health when medically stable   Consultants:  None  Procedures:  See below for x-ray reports   Antibiotics:  None   HPI/Subjective: Denies CP, SOB, nausea and vomiting. Reports significant improvement in her diarrhea and denies SI and hallucinations. Complaining of LE pain (neuropathic and chronic in nature)  Objective: Filed Vitals:   03/09/16 1413 03/09/16 1416  BP: 199/78 184/80  Pulse: 52   Temp: 97.3 F (36.3 C)   Resp: 18     Intake/Output Summary (Last 24 hours) at 03/09/16 1629 Last data filed at 03/09/16 1224  Gross per 24 hour  Intake    840 ml  Output      0 ml  Net    840 ml   Filed Weights   03/06/16 2119  Weight: 66.361 kg (146 lb 4.8 oz)    Exam:   General:  Awake, alert, with clear speech. Cooperative, answering questions appropriately. No fever. Patient denies SI or hallucinations. Flat affect appreciated on exam.  Cardiovascular: RRR, no murmurs.  Respiratory: CTA bilaterally, no use of accessory muscles  Abdomen: soft, nontender, nondistended +BS  Extremities: No edema, no cyanosis   Data Reviewed: Basic Metabolic Panel:  Recent Labs Lab 03/06/16 0402 03/06/16 1920 03/06/16 2048 03/07/16 0418 03/08/16 0446 03/09/16 1016  NA 135 131* 131* 134* 132* 130*  K 2.5* 2.4* 2.4* 2.9* 2.9* 3.1*  CL 92* 91* 86* 99* 94* 94*  CO2 31 29  --  27 28 28   GLUCOSE 110* 168* 128* 100* 92 100*  BUN 12 15 14 10 9 9   CREATININE 0.78 0.85 0.70 0.64 0.62 0.69  CALCIUM 9.4 8.7*  --  7.9* 8.4* 8.4*  MG  --   --   --  1.4* 1.7 1.5*  PHOS  --   --   --   --   --  1.5*   Liver Function Tests:  Recent Labs Lab 03/06/16 1920 03/07/16 0418  AST 48* 43*  ALT 21 17   ALKPHOS 48 41  BILITOT 0.3 0.2*  PROT 7.2 5.9*  ALBUMIN 3.9 3.2*    Recent Labs Lab 03/06/16 1920  LIPASE 24   CBC:  Recent Labs Lab 03/06/16 0402 03/06/16 1920 03/06/16 2048 03/07/16 0418  WBC 4.3 3.4*  --  4.4  NEUTROABS 2.3 2.4  --  2.0  HGB 14.9 13.6 13.6 13.3  HCT 41.3 37.8 40.0 36.9  MCV 85.7 83.6  --  84.2  PLT 216 174  --  150   CBG: No results for input(s): GLUCAP in the last 168 hours.  Recent Results (from the past 240 hour(s))  Blood culture (routine x 2)     Status: None (Preliminary result)   Collection Time: 03/06/16  8:26 PM  Result Value Ref Range Status   Specimen Description BLOOD BLOOD RIGHT HAND  Final   Special Requests BOTTLES DRAWN AEROBIC AND ANAEROBIC 5ML EA  Final   Culture   Final    NO GROWTH 2 DAYS Performed at [facility]    Report Status PENDING  Incomplete  Blood culture (routine x 2)     Status: None (Preliminary result)   Collection Time: 03/06/16  8:26 PM  Result Value Ref Range Status   Specimen Description BLOOD BLOOD LEFT HAND  Final   Special Requests BOTTLES DRAWN AEROBIC AND ANAEROBIC 5 ML EA  Final   Culture   Final    NO GROWTH 2 DAYS Performed at [facility]    Report Status PENDING  Incomplete  C difficile quick scan w PCR reflex     Status: None   Collection Time: 03/07/16  2:56 PM  Result Value Ref Range Status   C Diff antigen NEGATIVE NEGATIVE Final   C Diff toxin NEGATIVE NEGATIVE Final   C Diff interpretation Negative for toxigenic C. difficile  Final     Studies: No results found.  Scheduled Meds: . atenolol  100 mg Oral BID  . benztropine  0.5 mg Oral Daily  . enoxaparin (LOVENOX) injection  40 mg Subcutaneous QHS  . guaiFENesin  600 mg Oral BID  . levothyroxine  100 mcg Oral QAC breakfast  . liothyronine  12.5 mcg Oral BID  . lisinopril  20 mg Oral BID  . magnesium sulfate 1 - 4 g bolus IVPB  2 g Intravenous Once  . PARoxetine  40 mg Oral Daily  . potassium chloride  40 mEq  Oral Daily  . potassium phosphate IVPB (mEq)  20 mEq Intravenous Once  . pravastatin  80 mg Oral q1800  . predniSONE  20 mg Oral Q breakfast  . spironolactone  25 mg Oral Daily  . thiothixene  4 mg Oral QHS   Continuous Infusions:   Principal Problem:   Overdose Active Problems:   CAP (community acquired pneumonia)   Hypokalemia   Hypothyroidism   Schizoaffective disorder ([place])   Essential hypertension   COPD exacerbation ([place])   Tobacco abuse   Time spent: 30 minutes  [place], [place]  [facility]  If 7PM-7AM, please contact night-coverage at [email or web address], password [facility] 03/09/2016, 4:29 PM  LOS: 3 days

## 2016-03-09 NOTE — Plan of Care (Signed)
Problem: Safety: Goal: Ability to remain free from injury will improve Outcome: Progressing .  Problem: Pain Managment: Goal: General experience of comfort will improve Complaints of aching in the legs and restless leg syndrome overnight.   Norco x1 given on night shift.   No complaint of pain at this time. Continue to monitor.    Problem: Physical Regulation: Goal: Ability to maintain clinical measurements within normal limits will improve Outcome: Progressing Continue to monitor labs.  K+, Na+, mag and phosphorus.    Problem: Activity: Goal: Risk for activity intolerance will decrease Outcome: Progressing .  Problem: Nutrition: Goal: Adequate nutrition will be maintained Outcome: Progressing .

## 2016-03-10 LAB — BASIC METABOLIC PANEL
ANION GAP: 10 (ref 5–15)
BUN: 9 mg/dL (ref 6–20)
CALCIUM: 8.4 mg/dL — AB (ref 8.9–10.3)
CHLORIDE: 98 mmol/L — AB (ref 101–111)
CO2: 24 mmol/L (ref 22–32)
Creatinine, Ser: 0.62 mg/dL (ref 0.44–1.00)
GFR calc non Af Amer: >60 mL/min (ref >60)
Glucose, Bld: 112 mg/dL — ABNORMAL HIGH (ref 65–99)
Potassium: 3 mmol/L — ABNORMAL LOW (ref 3.5–5.1)
Sodium: 132 mmol/L — ABNORMAL LOW (ref 135–145)

## 2016-03-10 LAB — MAGNESIUM: Magnesium: 1.9 mg/dL (ref 1.7–2.4)

## 2016-03-10 MED ORDER — HYDRALAZINE HCL 25 MG PO TABS
25.0000 mg | ORAL_TABLET | Freq: Four times a day (QID) | ORAL | Status: DC
  Administered 2016-03-10 – 2016-03-12 (×9): 25 mg via ORAL
  Filled 2016-03-10 (×9): qty 1

## 2016-03-10 MED ORDER — LOPERAMIDE HCL 2 MG PO CAPS
4.0000 mg | ORAL_CAPSULE | Freq: Once | ORAL | Status: AC
  Administered 2016-03-10: 4 mg via ORAL
  Filled 2016-03-10: qty 2

## 2016-03-10 MED ORDER — GUAIFENESIN ER 600 MG PO TB12
600.0000 mg | ORAL_TABLET | Freq: Two times a day (BID) | ORAL | Status: DC
  Administered 2016-03-10 – 2016-03-14 (×10): 600 mg via ORAL
  Filled 2016-03-10 (×10): qty 1

## 2016-03-10 MED ORDER — LOPERAMIDE HCL 2 MG PO CAPS: 2.0000 mg | ORAL_CAPSULE | ORAL | Status: DC | PRN

## 2016-03-10 MED ORDER — POTASSIUM CHLORIDE CRYS ER 20 MEQ PO TBCR
40.0000 meq | EXTENDED_RELEASE_TABLET | Freq: Two times a day (BID) | ORAL | Status: AC
  Administered 2016-03-10 – 2016-03-12 (×6): 40 meq via ORAL
  Filled 2016-03-10 (×6): qty 2

## 2016-03-10 NOTE — Progress Notes (Addendum)
[facility] PROGRESS NOTE  Audrey Meyer [id number] DOB: 06-09-1956 DOA: 03/06/2016 PCP: Dwan Bolt, MD  Brief narrative: 60 year old female with history of microscopic colitis and chronic diarrhea admitted with drug overdose with trazodone. Continues to have severe hypokalemia-in spite of aggressive potassium supplementation he did hypokalemia is likely secondary to ongoing diarrhea. Seen by psychiatry, plans are to discharge to inpatient psych facility when hypokalemia and diarrhea improved.  Assessment/Plan: Trazodone overdose: Suicidal attempt with trazodone >72 hours prior to admission.  -Continue on telemetry (mainly for low K), no major arrhythmias seen.  -Psychiatry recommends patient to be transferred to behavioral health once medically stable. -will continue sitter at bedside and follow response  - Hopefully will be medically stable for transfer and 24 hours if her diarrhea and hypokalemia continues to improve.  Hypokalemia, low phosphorus and hypomagnesemia :  - Secondary to GI loss from diarrhea and use of chlorthalidone for BP prior to admission. -Chlorthalidone discontinued -diarrhea continues to improve, reports 4 bowel movements over last 24 hours, will give one dose of Imodium, will continue on when necessary. -will continue electrolytes monitoring and repletion as needed -started on spironolactone and is on lisinopril   Diarrhea:  - History of microscopic colitis. Dr. Evalee Mutton discussed with with Dr. Benson Norway, recommendations was to start  prednisone 20mg  -Diarrhea has significantly improved. -C.diff negative.  -Will continue monitoring and after 10 days treatment initiate slow tapering.  COPD: stable -Lungs clear, continue bronchodilators. -prednisone use for microscopic colitis will also help keeping airways open   Hypothyroidism:  -Continue Synthroid and Cytomel  Hypertension:   - On the higher side , will discontinue atenolol giving bradycardia  on telemetry heart rate in 50s , continue with lisinopril and Aldactone, will start on low-dose hydralazine, and titrate as needed .  Dyslipidemia:  -Continue statin  History of major recurrent depression with schizoaffective disorder and psychosis:  - Psych consulted and recommending inpatient treatment. -denies SI or hallucinations currently -will continue continue Paxil and thiotixene.  Chronic leg pain -will use PRN vicodin -chronic and neuropathic in nature    Code Status: Full Family Communication: None at bedside Disposition Plan: Remain inpatient, plan is to transfer to behavioral health when medically stable, hopefully will be medically stable and 24 hours if continues to improve.   Consultants:  Psychiatrically  Procedures:  See below for x-ray reports   Antibiotics:  None   HPI/Subjective: Denies CP, SOB, nausea and vomiting. Reports significant improvement in her diarrhea and denies SI and hallucinations.   Objective: Filed Vitals:   03/09/16 2053 03/10/16 0451  BP: 175/65 166/70  Pulse: 54 56  Temp: 97.9 F (36.6 C) 97.6 F (36.4 C)  Resp: 18 18    Intake/Output Summary (Last 24 hours) at 03/10/16 1211 Last data filed at 03/10/16 1155  Gross per 24 hour  Intake   1080 ml  Output      0 ml  Net   1080 ml   Filed Weights   03/06/16 2119  Weight: 66.361 kg (146 lb 4.8 oz)    Exam:   General:  Awake, alert, with clear speech. Cooperative, answering questions appropriately.  Flat affect  Cardiovascular: RRR, no murmurs.  Respiratory: CTA bilaterally, no use of accessory muscles  Abdomen: soft, nontender, nondistended +BS  Extremities: No edema, no cyanosis   Data Reviewed: Basic Metabolic Panel:  Recent Labs Lab 03/06/16 1920 03/06/16 2048 03/07/16 0418 03/08/16 0446 03/09/16 1016 03/10/16 0431  NA 131* 131* 134* 132* 130* 132*  K  2.4* 2.4* 2.9* 2.9* 3.1* 3.0*  CL 91* 86* 99* 94* 94* 98*  CO2 29  --  27 28 28 24   GLUCOSE  168* 128* 100* 92 100* 112*  BUN 15 14 10 9 9 9   CREATININE 0.85 0.70 0.64 0.62 0.69 0.62  CALCIUM 8.7*  --  7.9* 8.4* 8.4* 8.4*  MG  --   --  1.4* 1.7 1.5* 1.9  PHOS  --   --   --   --  1.5*  --    Liver Function Tests:  Recent Labs Lab 03/06/16 1920 03/07/16 0418  AST 48* 43*  ALT 21 17  ALKPHOS 48 41  BILITOT 0.3 0.2*  PROT 7.2 5.9*  ALBUMIN 3.9 3.2*    Recent Labs Lab 03/06/16 1920  LIPASE 24   CBC:  Recent Labs Lab 03/06/16 0402 03/06/16 1920 03/06/16 2048 03/07/16 0418  WBC 4.3 3.4*  --  4.4  NEUTROABS 2.3 2.4  --  2.0  HGB 14.9 13.6 13.6 13.3  HCT 41.3 37.8 40.0 36.9  MCV 85.7 83.6  --  84.2  PLT 216 174  --  150   CBG: No results for input(s): GLUCAP in the last 168 hours.  Recent Results (from the past 240 hour(s))  Blood culture (routine x 2)     Status: None (Preliminary result)   Collection Time: 03/06/16  8:26 PM  Result Value Ref Range Status   Specimen Description BLOOD BLOOD RIGHT HAND  Final   Special Requests BOTTLES DRAWN AEROBIC AND ANAEROBIC 5ML EA  Final   Culture   Final    NO GROWTH 2 DAYS Performed at [facility]    Report Status PENDING  Incomplete  Blood culture (routine x 2)     Status: None (Preliminary result)   Collection Time: 03/06/16  8:26 PM  Result Value Ref Range Status   Specimen Description BLOOD BLOOD LEFT HAND  Final   Special Requests BOTTLES DRAWN AEROBIC AND ANAEROBIC 5 ML EA  Final   Culture   Final    NO GROWTH 2 DAYS Performed at [facility]    Report Status PENDING  Incomplete  C difficile quick scan w PCR reflex     Status: None   Collection Time: 03/07/16  2:56 PM  Result Value Ref Range Status   C Diff antigen NEGATIVE NEGATIVE Final   C Diff toxin NEGATIVE NEGATIVE Final   C Diff interpretation Negative for toxigenic C. difficile  Final     Studies: No results found.  Scheduled Meds: . benztropine  0.5 mg Oral Daily  . enoxaparin (LOVENOX) injection  40 mg Subcutaneous QHS   . guaiFENesin  600 mg Oral BID  . hydrALAZINE  25 mg Oral 4 times per day  . levothyroxine  100 mcg Oral QAC breakfast  . liothyronine  12.5 mcg Oral BID  . lisinopril  20 mg Oral BID  . loperamide  4 mg Oral Once  . PARoxetine  40 mg Oral Daily  . potassium chloride  40 mEq Oral BID  . pravastatin  80 mg Oral q1800  . predniSONE  20 mg Oral Q breakfast  . spironolactone  25 mg Oral Daily  . thiothixene  4 mg Oral QHS   Continuous Infusions:   Principal Problem:   Overdose Active Problems:   CAP (community acquired pneumonia)   Hypokalemia   Hypothyroidism   Schizoaffective disorder ([place])   Essential hypertension   COPD exacerbation ([place])  Tobacco abuse   Time spent: 25 minutes  [place], [place]  [facility]  If 7PM-7AM, please contact night-coverage at [email or web address], password [facility] 03/10/2016, 12:11 PM  LOS: 4 days

## 2016-03-10 NOTE — Progress Notes (Signed)
Assumed care of patient at this time. Patient is stable with no complaints at this time. Agree with previously documented assessment. Will continue to monitor patient.   

## 2016-03-11 DIAGNOSIS — F329 Major depressive disorder, single episode, unspecified: Secondary | ICD-10-CM

## 2016-03-11 LAB — BASIC METABOLIC PANEL
Anion gap: 11 (ref 5–15)
BUN: 9 mg/dL (ref 6–20)
CO2: 23 mmol/L (ref 22–32)
CREATININE: 0.74 mg/dL (ref 0.44–1.00)
Calcium: 9.3 mg/dL (ref 8.9–10.3)
Chloride: 104 mmol/L (ref 101–111)
Glucose, Bld: 119 mg/dL — ABNORMAL HIGH (ref 65–99)
Potassium: 3.4 mmol/L — ABNORMAL LOW (ref 3.5–5.1)
SODIUM: 138 mmol/L (ref 135–145)

## 2016-03-11 MED ORDER — ATENOLOL 50 MG PO TABS
50.0000 mg | ORAL_TABLET | Freq: Two times a day (BID) | ORAL | Status: DC
  Administered 2016-03-11 – 2016-03-12 (×2): 50 mg via ORAL
  Filled 2016-03-11 (×2): qty 1

## 2016-03-11 MED ORDER — HYDRALAZINE HCL 20 MG/ML IJ SOLN
10.0000 mg | Freq: Three times a day (TID) | INTRAMUSCULAR | Status: DC | PRN
  Administered 2016-03-13 – 2016-03-14 (×2): 10 mg via INTRAVENOUS
  Filled 2016-03-11 (×2): qty 1

## 2016-03-11 NOTE — Progress Notes (Signed)
Writer has been informed that patient has been medically clear on 4/7 and that pt needs psych inpatient treatment.  Patient has been referred to psychiatric inpatient treatment at: [place] - no beds but accepting referrals today, per [facility] - per [place], accepting referrals, 1 bed available. [place] - per Shirlee Limerick, beds open. [facility] - 1 bed, per Tyson Babinski - left voicemail inquiring about bed status. [place] [place], [place], [place] - accepting referrals for the watilist.  At capacity: [facility]  [place], [place] Disposition staff 03/11/2016 10:55 PM

## 2016-03-11 NOTE — Progress Notes (Addendum)
[facility] PROGRESS NOTE  Audrey Meyer [id number] DOB: 1955-12-08 DOA: 03/06/2016 PCP: Dwan Bolt, MD  Brief narrative: 60 year old female with history of microscopic colitis and chronic diarrhea admitted with drug overdose with trazodone. Continues to have severe hypokalemia-in spite of aggressive potassium supplementation he did hypokalemia is likely secondary to ongoing diarrhea. Seen by psychiatry, plans are to discharge to inpatient psych facility when hypokalemia and diarrhea improved.  Assessment/Plan: Trazodone overdose: Suicidal attempt with trazodone >48-72 hours prior to admission.  -Continue on telemetry (mainly for low K), no major arrhythmias seen.  -Psychiatry recommends patient to be transferred to behavioral health once medically stable. -will continue sitter at bedside and follow response   Hypokalemia, low phosphorus and hypomagnesemia : Secondary to GI loss from diarrhea and use of chlorthalidone for BP prior to admission. -Chlorthalidone has been discontinued -diarrhea has now improved -will continue electrolytes monitoring and repletion as needed -started on spironolactone and is on lisinopril   Diarrhea: History of microscopic colitis. After speaking with Dr. Benson Norway, recommendations was to start  prednisone 20mg  -Diarrhea has significantly improved. -C.diff negative.  -Will continue monitoring and after 10 days treatment initiate slow tapering. -diarrhea improved/resolved essentially  COPD: stable -Lungs clear, continue bronchodilators. -prednisone use for microscopic colitis will also help keeping airways open   Hypothyroidism:  -Continue Synthroid and Cytomel  Hypertension:  Relatively well-controlled -Continue Atenolol, Lisinopril, hydralazine and Aldactone -followed vital signs and adjust further as needed   Dyslipidemia:  -Continue statin  History of major recurrent depression with schizoaffective disorder and psychosis: with  acute exacerbation. Psych consulted and recommending inpatient treatment. -denies SI or hallucinations currently -will continue continue Paxil and thiotixene.  Chronic leg pain -will use PRN vicodin -chronic and neuropathic in nature    Code Status: Full Family Communication: None at bedside Disposition Plan: Remain inpatient, plan is to transfer to behavioral health when medically stable. Patient with significant improvement in her diarrhea and controlled electrolytes now. Waiting bed at [facility]   Consultants:  None  Procedures:  See below for x-ray reports   Antibiotics:  None   HPI/Subjective: Denies CP, SOB, nausea and vomiting. Reports significant improvement in her diarrhea and denies SI and hallucinations. Reports being anxious today  Objective: Filed Vitals:   03/11/16 1509 03/11/16 1804  BP: 188/67 207/67  Pulse: 51   Temp: 97.5 F (36.4 C)   Resp: 20     Intake/Output Summary (Last 24 hours) at 03/11/16 1817 Last data filed at 03/11/16 1514  Gross per 24 hour  Intake   1280 ml  Output      0 ml  Net   1280 ml   Filed Weights   03/06/16 2119  Weight: 66.361 kg (146 lb 4.8 oz)    Exam:   General:  Awake, alert, with clear speech. Cooperative, answering questions appropriately. No fever. Patient denies SI or hallucinations. Flat affect appreciated on exam. Reported feeling anxious   Cardiovascular: RRR, no murmurs.  Respiratory: CTA bilaterally, no use of accessory muscles  Abdomen: soft, nontender, nondistended +BS  Extremities: No edema, no cyanosis   Data Reviewed: Basic Metabolic Panel:  Recent Labs Lab 03/07/16 0418 03/08/16 0446 03/09/16 1016 03/10/16 0431 03/11/16 0431  NA 134* 132* 130* 132* 138  K 2.9* 2.9* 3.1* 3.0* 3.4*  CL 99* 94* 94* 98* 104  CO2 27 28 28 24 23   GLUCOSE 100* 92 100* 112* 119*  BUN 10 9 9 9 9   CREATININE 0.64 0.62 0.69 0.62 0.74  CALCIUM  7.9* 8.4* 8.4* 8.4* 9.3  MG 1.4* 1.7 1.5* 1.9  --   PHOS  --   --   1.5*  --   --    Liver Function Tests:  Recent Labs Lab 03/06/16 1920 03/07/16 0418  AST 48* 43*  ALT 21 17  ALKPHOS 48 41  BILITOT 0.3 0.2*  PROT 7.2 5.9*  ALBUMIN 3.9 3.2*    Recent Labs Lab 03/06/16 1920  LIPASE 24   CBC:  Recent Labs Lab 03/06/16 0402 03/06/16 1920 03/06/16 2048 03/07/16 0418  WBC 4.3 3.4*  --  4.4  NEUTROABS 2.3 2.4  --  2.0  HGB 14.9 13.6 13.6 13.3  HCT 41.3 37.8 40.0 36.9  MCV 85.7 83.6  --  84.2  PLT 216 174  --  150   CBG: No results for input(s): GLUCAP in the last 168 hours.  Recent Results (from the past 240 hour(s))  Blood culture (routine x 2)     Status: None (Preliminary result)   Collection Time: 03/06/16  8:26 PM  Result Value Ref Range Status   Specimen Description BLOOD BLOOD RIGHT HAND  Final   Special Requests BOTTLES DRAWN AEROBIC AND ANAEROBIC 5ML EA  Final   Culture   Final    NO GROWTH 4 DAYS Performed at [facility]    Report Status PENDING  Incomplete  Blood culture (routine x 2)     Status: None (Preliminary result)   Collection Time: 03/06/16  8:26 PM  Result Value Ref Range Status   Specimen Description BLOOD BLOOD LEFT HAND  Final   Special Requests BOTTLES DRAWN AEROBIC AND ANAEROBIC 5 ML EA  Final   Culture   Final    NO GROWTH 4 DAYS Performed at [facility]    Report Status PENDING  Incomplete  C difficile quick scan w PCR reflex     Status: None   Collection Time: 03/07/16  2:56 PM  Result Value Ref Range Status   C Diff antigen NEGATIVE NEGATIVE Final   C Diff toxin NEGATIVE NEGATIVE Final   C Diff interpretation Negative for toxigenic C. difficile  Final     Studies: No results found.  Scheduled Meds: . benztropine  0.5 mg Oral Daily  . enoxaparin (LOVENOX) injection  40 mg Subcutaneous QHS  . guaiFENesin  600 mg Oral BID  . hydrALAZINE  25 mg Oral 4 times per day  . levothyroxine  100 mcg Oral QAC breakfast  . liothyronine  12.5 mcg Oral BID  . lisinopril  20 mg Oral  BID  . PARoxetine  40 mg Oral Daily  . potassium chloride  40 mEq Oral BID  . pravastatin  80 mg Oral q1800  . predniSONE  20 mg Oral Q breakfast  . spironolactone  25 mg Oral Daily  . thiothixene  4 mg Oral QHS   Continuous Infusions:   Principal Problem:   Overdose Active Problems:   CAP (community acquired pneumonia)   Hypokalemia   Hypothyroidism   Schizoaffective disorder ([place])   Essential hypertension   COPD exacerbation ([place])   Tobacco abuse   Time spent: 30 minutes  [place], [place]  [facility]  If 7PM-7AM, please contact night-coverage at [email or web address], password [facility] 03/11/2016, 6:17 PM  LOS: 5 days

## 2016-03-11 NOTE — Progress Notes (Signed)
CSW received notification that pt is medically cleared and ready for inpatient psych placement.   CSW has requested assistance from [facility] CSW locating an appropriate bed for this pt.  Will continue to follow.  Alison Murray, MSW, LCSW Clinical Social Work Psych coverage [phone]

## 2016-03-11 NOTE — Care Management Important Message (Signed)
Important Message  Patient Details  Name: Audrey Meyer MRN: [id number] Date of Birth: 12-18-1955   Medicare Important Message Given:  Yes    Camillo Flaming 03/11/2016, 10:52 AMImportant Message  Patient Details  Name: Audrey Meyer MRN: [id number] Date of Birth: [birth year]   Medicare Important Message Given:  Yes    Camillo Flaming 03/11/2016, 10:52 AM

## 2016-03-12 LAB — BASIC METABOLIC PANEL
Anion gap: 9 (ref 5–15)
BUN: 11 mg/dL (ref 6–20)
CALCIUM: 9 mg/dL (ref 8.9–10.3)
CO2: 21 mmol/L — AB (ref 22–32)
CREATININE: 0.6 mg/dL (ref 0.44–1.00)
Chloride: 105 mmol/L (ref 101–111)
Glucose, Bld: 98 mg/dL (ref 65–99)
Potassium: 4.5 mmol/L (ref 3.5–5.1)
Sodium: 135 mmol/L (ref 135–145)

## 2016-03-12 LAB — CULTURE, BLOOD (ROUTINE X 2)
CULTURE: NO GROWTH
Culture: NO GROWTH

## 2016-03-12 MED ORDER — HYDRALAZINE HCL 50 MG PO TABS
50.0000 mg | ORAL_TABLET | Freq: Four times a day (QID) | ORAL | Status: DC
  Administered 2016-03-12 – 2016-03-14 (×9): 50 mg via ORAL
  Filled 2016-03-12 (×9): qty 1

## 2016-03-12 MED ORDER — ATENOLOL 50 MG PO TABS
100.0000 mg | ORAL_TABLET | Freq: Two times a day (BID) | ORAL | Status: DC
  Administered 2016-03-12 – 2016-03-14 (×4): 100 mg via ORAL
  Filled 2016-03-12 (×4): qty 2

## 2016-03-12 NOTE — Progress Notes (Signed)
[facility] PROGRESS NOTE  Audrey Meyer [id number] DOB: 1956-07-17 DOA: 03/06/2016 PCP: Dwan Bolt, MD  Brief narrative: 60 year old female with history of microscopic colitis and chronic diarrhea admitted with drug overdose with trazodone. Continues to have severe hypokalemia-in spite of aggressive potassium supplementation he did hypokalemia is likely secondary to ongoing diarrhea. Seen by psychiatry, plans are to discharge to inpatient psych facility when hypokalemia and diarrhea improved.  Assessment/Plan: Trazodone overdose: Suicidal attempt with trazodone >48-72 hours prior to admission.  -Continue on telemetry (mainly for low K), no major arrhythmias seen.  -Psychiatry recommends patient to be transferred to behavioral health once medically stable. -will continue sitter at bedside and follow response   Hypokalemia, low phosphorus and hypomagnesemia : Secondary to GI loss from diarrhea and use of chlorthalidone for BP prior to admission. -Chlorthalidone has been discontinued -diarrhea has now improved -will continue electrolytes monitoring and repletion as needed -started on spironolactone and is on lisinopril   Diarrhea: History of microscopic colitis. After speaking with Dr. Benson Norway, recommendations was to start  prednisone 20mg  -Diarrhea has significantly improved. -C.diff negative.  -Will continue monitoring and after 10 days treatment initiate slow tapering. -diarrhea improved/resolved essentially  COPD: stable -Lungs clear, continue bronchodilators. -prednisone use for microscopic colitis will also help keeping airways open   Hypothyroidism:  -Continue Synthroid and Cytomel  Hypertension:  Relatively well-controlled -Continue Atenolol, Lisinopril, hydralazine and Aldactone -followed vital signs and adjust further as needed   Dyslipidemia:  -Continue statin  History of major recurrent depression with schizoaffective disorder and psychosis: with  acute exacerbation. Psych consulted and recommending inpatient treatment. -denies SI or hallucinations currently -will continue continue Paxil and thiotixene.  Chronic leg pain -will use PRN vicodin -chronic and neuropathic in nature    Code Status: Full Family Communication: None at bedside Disposition Plan: Remain inpatient, plan is to transfer to behavioral health when medically stable. Patient with significant improvement in her diarrhea and controlled electrolytes now. Waiting bed at [facility]   Consultants:  None  Procedures:  See below for x-ray reports   Antibiotics:  None   HPI/Subjective: Denies CP, SOB, nausea and vomiting. Reports diarrhea is not longer a problem and has normal electrolytes. No talking much and with flat affect.  Objective: Filed Vitals:   03/12/16 0614 03/12/16 0855  BP: 182/70 182/68  Pulse: 61 59  Temp: 97.5 F (36.4 C) 98 F (36.7 C)  Resp: 16 20    Intake/Output Summary (Last 24 hours) at 03/12/16 1139 Last data filed at 03/11/16 1823  Gross per 24 hour  Intake    480 ml  Output      0 ml  Net    480 ml   Filed Weights   03/06/16 2119  Weight: 66.361 kg (146 lb 4.8 oz)    Exam:   General:  Awake, alert, with clear speech. Cooperative, answering questions appropriately. No fever. Patient denies SI or hallucinations. Flat affect appreciated on exam and no talking much.   Cardiovascular: RRR, no murmurs.  Respiratory: CTA bilaterally, no use of accessory muscles  Abdomen: soft, nontender, nondistended +BS  Extremities: No edema, no cyanosis   Data Reviewed: Basic Metabolic Panel:  Recent Labs Lab 03/07/16 0418 03/08/16 0446 03/09/16 1016 03/10/16 0431 03/11/16 0431 03/12/16 0515  NA 134* 132* 130* 132* 138 135  K 2.9* 2.9* 3.1* 3.0* 3.4* 4.5  CL 99* 94* 94* 98* 104 105  CO2 27 28 28 24 23  21*  GLUCOSE 100* 92 100* 112* 119* 98  BUN 10 9 9 9 9 11   CREATININE 0.64 0.62 0.69 0.62 0.74 0.60  CALCIUM 7.9* 8.4* 8.4*  8.4* 9.3 9.0  MG 1.4* 1.7 1.5* 1.9  --   --   PHOS  --   --  1.5*  --   --   --    Liver Function Tests:  Recent Labs Lab 03/06/16 1920 03/07/16 0418  AST 48* 43*  ALT 21 17  ALKPHOS 48 41  BILITOT 0.3 0.2*  PROT 7.2 5.9*  ALBUMIN 3.9 3.2*    Recent Labs Lab 03/06/16 1920  LIPASE 24   CBC:  Recent Labs Lab 03/06/16 0402 03/06/16 1920 03/06/16 2048 03/07/16 0418  WBC 4.3 3.4*  --  4.4  NEUTROABS 2.3 2.4  --  2.0  HGB 14.9 13.6 13.6 13.3  HCT 41.3 37.8 40.0 36.9  MCV 85.7 83.6  --  84.2  PLT 216 174  --  150   CBG: No results for input(s): GLUCAP in the last 168 hours.  Recent Results (from the past 240 hour(s))  Blood culture (routine x 2)     Status: None   Collection Time: 03/06/16  8:26 PM  Result Value Ref Range Status   Specimen Description BLOOD BLOOD RIGHT HAND  Final   Special Requests BOTTLES DRAWN AEROBIC AND ANAEROBIC 5ML EA  Final   Culture   Final    NO GROWTH 5 DAYS Performed at [facility]    Report Status 03/12/2016 FINAL  Final  Blood culture (routine x 2)     Status: None   Collection Time: 03/06/16  8:26 PM  Result Value Ref Range Status   Specimen Description BLOOD BLOOD LEFT HAND  Final   Special Requests BOTTLES DRAWN AEROBIC AND ANAEROBIC 5 ML EA  Final   Culture   Final    NO GROWTH 5 DAYS Performed at [facility]    Report Status 03/12/2016 FINAL  Final  C difficile quick scan w PCR reflex     Status: None   Collection Time: 03/07/16  2:56 PM  Result Value Ref Range Status   C Diff antigen NEGATIVE NEGATIVE Final   C Diff toxin NEGATIVE NEGATIVE Final   C Diff interpretation Negative for toxigenic C. difficile  Final     Studies: No results found.  Scheduled Meds: . atenolol  100 mg Oral BID  . benztropine  0.5 mg Oral Daily  . enoxaparin (LOVENOX) injection  40 mg Subcutaneous QHS  . guaiFENesin  600 mg Oral BID  . hydrALAZINE  50 mg Oral 4 times per day  . levothyroxine  100 mcg Oral QAC breakfast   . liothyronine  12.5 mcg Oral BID  . lisinopril  20 mg Oral BID  . PARoxetine  40 mg Oral Daily  . potassium chloride  40 mEq Oral BID  . pravastatin  80 mg Oral q1800  . predniSONE  20 mg Oral Q breakfast  . spironolactone  25 mg Oral Daily  . thiothixene  4 mg Oral QHS   Continuous Infusions:   Principal Problem:   Overdose Active Problems:   CAP (community acquired pneumonia)   Hypokalemia   Hypothyroidism   Schizoaffective disorder ([place])   Essential hypertension   COPD exacerbation ([place])   Tobacco abuse   Time spent: 25 minutes  [place], [place]  [facility]  If 7PM-7AM, please contact night-coverage at [email or web address], password [facility] 03/12/2016, 11:39 AM  LOS: 6 days

## 2016-03-13 DIAGNOSIS — R197 Diarrhea, unspecified: Secondary | ICD-10-CM

## 2016-03-13 MED ORDER — PREDNISONE 20 MG PO TABS
20.0000 mg | ORAL_TABLET | Freq: Every day | ORAL | Status: DC
  Administered 2016-03-14: 20 mg via ORAL
  Filled 2016-03-13: qty 1

## 2016-03-13 MED ORDER — PREDNISONE 5 MG PO TABS: 15.0000 mg | ORAL_TABLET | Freq: Every day | ORAL | Status: DC

## 2016-03-13 NOTE — Progress Notes (Signed)
CSW spoke with Frankey Poot- Disposition at [facility]. He indicated that patient's referral was faxed out Friday night but that at this time there are no bed offers for patient. He is aware and will continue to monitor for possible openings. Handoff note left for weekday CSW for further follow up.  Lorie Phenix. Pauline Good, [place] (weekend coverage)

## 2016-03-13 NOTE — Progress Notes (Signed)
[facility] PROGRESS NOTE  Audrey Meyer [id number] DOB: 08-13-1956 DOA: 03/06/2016 PCP: Dwan Bolt, MD  Brief narrative: 60 year old female with history of microscopic colitis and chronic diarrhea admitted with drug overdose with trazodone. Continues to have severe hypokalemia-in spite of aggressive potassium supplementation he did hypokalemia is likely secondary to ongoing diarrhea. Seen by psychiatry, plans are to discharge to inpatient psych facility when hypokalemia and diarrhea improved.  Assessment/Plan: Trazodone overdose: Suicidal attempt with trazodone >48-72 hours prior to admission.  -Continue on telemetry (mainly for low K), no major arrhythmias seen.  -Psychiatry recommends patient to be transferred to behavioral health once medically stable. -will continue sitter at bedside and follow response   Hypokalemia, low phosphorus and hypomagnesemia : Secondary to GI loss from diarrhea and use of chlorthalidone for BP prior to admission. -Chlorthalidone has been discontinued -diarrhea has now improved -will continue electrolytes monitoring and repletion as needed -started on spironolactone and is on lisinopril   Diarrhea: History of microscopic colitis. After speaking with Dr. Benson Norway, recommendations was to start  prednisone 20mg  -Diarrhea has significantly improved. -C.diff negative.  -Will continue monitoring and after 10 days treatment initiate slow tapering. -diarrhea improved/resolved essentially  COPD: stable -Lungs clear, continue bronchodilators. -prednisone use for microscopic colitis will also help keeping airways open   Hypothyroidism:  -Continue Synthroid and Cytomel  Hypertension:  Relatively well-controlled -Continue Atenolol, Lisinopril, hydralazine and Aldactone -followed vital signs and adjust further as needed   Dyslipidemia:  -Continue statin  History of major recurrent depression with schizoaffective disorder and psychosis: with  acute exacerbation. Psych consulted and recommending inpatient treatment. -denies SI or hallucinations currently -will continue continue Paxil and thiotixene.  Chronic leg pain -will use PRN vicodin -chronic and neuropathic in nature    Code Status: Full Family Communication: None at bedside Disposition Plan: Remain inpatient, plan is to transfer to behavioral health when medically stable. Patient with significant improvement in her diarrhea and controlled electrolytes now. Waiting bed at [facility]   Consultants:  None  Procedures:  See below for x-ray reports   Antibiotics:  None   HPI/Subjective: Denies CP, SOB, nausea and vomiting. Reports diarrhea is not longer a problem and has normal electrolytes. No in acute distress. Waiting to be transfer as she will like to get home eventually. No SI or hallucinations   Objective: Filed Vitals:   03/13/16 1309 03/13/16 1331  BP: 207/54 184/63  Pulse: 59 63  Temp: 97.2 F (36.2 C)   Resp: 20 20    Intake/Output Summary (Last 24 hours) at 03/13/16 1805 Last data filed at 03/13/16 1203  Gross per 24 hour  Intake   1080 ml  Output      0 ml  Net   1080 ml   Filed Weights   03/06/16 2119  Weight: 66.361 kg (146 lb 4.8 oz)    Exam:   General:  Awake, alert, with clear speech. Cooperative, answering questions appropriately. No fever. Patient denies SI or hallucinations. Flat affect appreciated on exam; no acute distress.  Cardiovascular: RRR, no murmurs.  Respiratory: CTA bilaterally, no use of accessory muscles  Abdomen: soft, nontender, nondistended +BS  Extremities: No edema, no cyanosis   Data Reviewed: Basic Metabolic Panel:  Recent Labs Lab 03/07/16 0418 03/08/16 0446 03/09/16 1016 03/10/16 0431 03/11/16 0431 03/12/16 0515  NA 134* 132* 130* 132* 138 135  K 2.9* 2.9* 3.1* 3.0* 3.4* 4.5  CL 99* 94* 94* 98* 104 105  CO2 27 28 28 24 23  21*  GLUCOSE 100* 92 100* 112* 119* 98  BUN 10 9 9 9 9 11    CREATININE 0.64 0.62 0.69 0.62 0.74 0.60  CALCIUM 7.9* 8.4* 8.4* 8.4* 9.3 9.0  MG 1.4* 1.7 1.5* 1.9  --   --   PHOS  --   --  1.5*  --   --   --    Liver Function Tests:  Recent Labs Lab 03/06/16 1920 03/07/16 0418  AST 48* 43*  ALT 21 17  ALKPHOS 48 41  BILITOT 0.3 0.2*  PROT 7.2 5.9*  ALBUMIN 3.9 3.2*    Recent Labs Lab 03/06/16 1920  LIPASE 24   CBC:  Recent Labs Lab 03/06/16 1920 03/06/16 2048 03/07/16 0418  WBC 3.4*  --  4.4  NEUTROABS 2.4  --  2.0  HGB 13.6 13.6 13.3  HCT 37.8 40.0 36.9  MCV 83.6  --  84.2  PLT 174  --  150   CBG: No results for input(s): GLUCAP in the last 168 hours.  Recent Results (from the past 240 hour(s))  Blood culture (routine x 2)     Status: None   Collection Time: 03/06/16  8:26 PM  Result Value Ref Range Status   Specimen Description BLOOD BLOOD RIGHT HAND  Final   Special Requests BOTTLES DRAWN AEROBIC AND ANAEROBIC 5ML EA  Final   Culture   Final    NO GROWTH 5 DAYS Performed at [facility]    Report Status 03/12/2016 FINAL  Final  Blood culture (routine x 2)     Status: None   Collection Time: 03/06/16  8:26 PM  Result Value Ref Range Status   Specimen Description BLOOD BLOOD LEFT HAND  Final   Special Requests BOTTLES DRAWN AEROBIC AND ANAEROBIC 5 ML EA  Final   Culture   Final    NO GROWTH 5 DAYS Performed at [facility]    Report Status 03/12/2016 FINAL  Final  C difficile quick scan w PCR reflex     Status: None   Collection Time: 03/07/16  2:56 PM  Result Value Ref Range Status   C Diff antigen NEGATIVE NEGATIVE Final   C Diff toxin NEGATIVE NEGATIVE Final   C Diff interpretation Negative for toxigenic C. difficile  Final     Studies: No results found.  Scheduled Meds: . atenolol  100 mg Oral BID  . benztropine  0.5 mg Oral Daily  . enoxaparin (LOVENOX) injection  40 mg Subcutaneous QHS  . guaiFENesin  600 mg Oral BID  . hydrALAZINE  50 mg Oral 4 times per day  . levothyroxine   100 mcg Oral QAC breakfast  . liothyronine  12.5 mcg Oral BID  . lisinopril  20 mg Oral BID  . PARoxetine  40 mg Oral Daily  . pravastatin  80 mg Oral q1800  . [START ON 03/14/2016] predniSONE  20 mg Oral Q breakfast  . spironolactone  25 mg Oral Daily  . thiothixene  4 mg Oral QHS   Continuous Infusions:   Principal Problem:   Overdose Active Problems:   CAP (community acquired pneumonia)   Hypokalemia   Hypothyroidism   Schizoaffective disorder ([place])   Essential hypertension   COPD exacerbation ([place])   Tobacco abuse   Time spent: 25 minutes  [place], [place]  [facility]  If 7PM-7AM, please contact night-coverage at [email or web address], password [facility] 03/13/2016, 6:05 PM  LOS: 7 days

## 2016-03-14 DIAGNOSIS — T1491 Suicide attempt: Secondary | ICD-10-CM

## 2016-03-14 DIAGNOSIS — Z720 Tobacco use: Secondary | ICD-10-CM

## 2016-03-14 DIAGNOSIS — T43212A Poisoning by selective serotonin and norepinephrine reuptake inhibitors, intentional self-harm, initial encounter: Principal | ICD-10-CM

## 2016-03-14 DIAGNOSIS — J449 Chronic obstructive pulmonary disease, unspecified: Secondary | ICD-10-CM | POA: Insufficient documentation

## 2016-03-14 DIAGNOSIS — F251 Schizoaffective disorder, depressive type: Secondary | ICD-10-CM

## 2016-03-14 LAB — BASIC METABOLIC PANEL
ANION GAP: 10 (ref 5–15)
BUN: 14 mg/dL (ref 6–20)
CALCIUM: 9.5 mg/dL (ref 8.9–10.3)
CO2: 22 mmol/L (ref 22–32)
CREATININE: 0.75 mg/dL (ref 0.44–1.00)
Chloride: 102 mmol/L (ref 101–111)
GFR calc Af Amer: >60 mL/min (ref >60)
GLUCOSE: 108 mg/dL — AB (ref 65–99)
Potassium: 3.8 mmol/L (ref 3.5–5.1)
Sodium: 134 mmol/L — ABNORMAL LOW (ref 135–145)

## 2016-03-14 LAB — MAGNESIUM: Magnesium: 1.5 mg/dL — ABNORMAL LOW (ref 1.7–2.4)

## 2016-03-14 MED ORDER — SPIRONOLACTONE 25 MG PO TABS: 25.0000 mg | ORAL_TABLET | Freq: Every day | ORAL | Status: DC

## 2016-03-14 MED ORDER — HYDRALAZINE HCL 50 MG PO TABS: 75.0000 mg | ORAL_TABLET | Freq: Three times a day (TID) | ORAL | Status: DC

## 2016-03-14 MED ORDER — PREDNISONE 10 MG PO TABS: ORAL_TABLET | ORAL | Status: DC

## 2016-03-14 MED ORDER — ATENOLOL 100 MG PO TABS: 100.0000 mg | ORAL_TABLET | Freq: Two times a day (BID) | ORAL | Status: DC

## 2016-03-14 NOTE — Plan of Care (Signed)
Problem: Safety: Goal: Ability to remain free from injury will improve Outcome: Progressing .  Problem: Pain Managment: Goal: General experience of comfort will improve Outcome: Progressing Occasional pain to BLE.  Occasional anxiety.   Treating with prn Norco and Valium. Effective.    Problem: Physical Regulation: Goal: Ability to maintain clinical measurements within normal limits will improve Outcome: Completed/Met Date Met:  03/14/16 .  Problem: Nutrition: Goal: Adequate nutrition will be maintained Outcome: Completed/Met Date Met:  03/14/16 .

## 2016-03-14 NOTE — Progress Notes (Signed)
Patient discharged @ 1730 via wheelchair in stable condition.  Educated pt regarding discharge instructions, new medications.  Prescriptions given.  Discharge instructions given.  Teach back completed.

## 2016-03-14 NOTE — Clinical Social Work Note (Signed)
Clinical Social Work Assessment  Patient Details  Name: Audrey Meyer MRN: [id number] Date of Birth: [birth year]  Date of referral:  03/14/16               Reason for consult:  Mental Health Concerns                Permission sought to share information with:    Permission granted to share information::     Name::        Agency::     Relationship::     Contact Information:     Housing/Transportation Living arrangements for the past 2 months:  Apartment Source of Information:    Patient Interpreter Needed:  None Criminal Activity/Legal Involvement Pertinent to Current Situation/Hospitalization:  No - Comment as needed Significant Relationships:  Spouse Lives with:  Spouse Do you feel safe going back to the place where you live?  Yes Need for family participation in patient care:  No (Coment)  Care giving concerns:  Pt cleared by psychiatrist for d/c home. Pt feels safe with plan.    Social Worker assessment / plan:    CSW met with pt to provide outpatient psychiatric follow up resources.   CSW introduced self and explained role. Pt calm and pleasant. Pt contracted for safety and reports that she goes to [facility] for medication management and is willing to follow up. Pt reports that she already has an upcoming appointment scheduled with [facility], but could not remember which date and time, but states that she has the information in her purse. Pt open to receiving outpatient psychiatry list for an additional resource.    Pt eager to return home and reports missing her dog. Patient has no irritability and calm and cooperative during visit.   Pt states that pt spouse will provide transportation home.   CSW reviewed pt shadow chart and pt was not IVC during admission.  No further social work needs identified at this time.  CSW signing off.   Employment status:    Insurance information:  Medicare PT Recommendations:    Information / Referral to community resources:  Outpatient  Psychiatric Care (Comment Required) (pt plans to follow up at [facility] for medication management/counseling)  Patient/Family's Response to care:  Pt alert and oriented x 4. Pt calm and cooperative. Pt denies suicidal or homicidal ideations. Pt plans to follow up at [facility] following discharge as pt is and established pt there.   Patient/Family's Understanding of and Emotional Response to Diagnosis, Current Treatment, and Prognosis:  Pt displayed understanding regarding recommendations for mental health follow up.   Emotional Assessment Appearance:  Appears stated age Attitude/Demeanor/Rapport:  Other (calm/cooperative) Affect (typically observed):  Calm Orientation:  Oriented to Self, Oriented to Place, Oriented to  Time, Oriented to Situation Alcohol / Substance use:  Not Applicable Psych involvement (Current and /or in the community):  No (Comment)  Discharge Needs  Concerns to be addressed:  Mental Health Concerns Readmission within the last 30 days:  No Current discharge risk:  None Barriers to Discharge:  No Barriers Identified   Pt discharging home via pt spouse via private vehicle. Outpatient mental health provider list provided as a resource.   CSW signing off.    KIDD, [place], LCSW 03/14/2016, 6:07 PM

## 2016-03-14 NOTE — Consult Note (Signed)
[place] [facility] Consult follow-up  Reason for Consult:  Depression and intentional overdose Referring Physician:  Dr. Sloan Leiter Patient Identification: Audrey Meyer MRN:  [id number] Principal Diagnosis: Overdose Diagnosis:   Patient Active Problem List   Diagnosis Date Noted  . Overdose [T50.901A] 03/07/2016  . Hypokalemia [E87.6] 03/07/2016  . Hypothyroidism [E03.9] 03/07/2016  . Schizoaffective disorder ([place]) [F25.9] 03/07/2016  . Essential hypertension [I10] 03/07/2016  . COPD exacerbation ([place]) [J44.1] 03/07/2016  . Tobacco abuse [Z72.0] 03/07/2016  . Pneumonia [J18.9] 03/06/2016  . CAP (community acquired pneumonia) [J18.9] 03/06/2016    Total Time spent with patient: 30 minutes  Subjective:   Audrey Meyer is a 60 y.o. female patient admitted with intentional overdose.  HPI:  Audrey Meyer is a 60 year old female seen, chart reviewed for psychiatric consultation and evaluation of schizoaffective disorder and status post intentional prescription drug overdose-50 tablets of trazodone and also feeling sick with her stomach. Patient endorses feeling depressed, anxious, paranoid and intentional drug overdose. Patient also endorses physical sickness, nausea, diarrhea and productive cough 3 days Patient stated she called 911 after her husband asked her to contact the emergency medical services. Reportedly patient informed to her husband about overdose on phone. Patient is a poor historian regarding talking about her psychosocial stresses. Patient stated that she has been having panic episodes more frequently, her husband also has anxiety disorder and they have decided not to stay together about a year ago in August. Patient stated that she has made the decision to be separated and divorced. Patient is concerned about this coming August will be receiving divorce from the court. Patient has a difficulty to express her depression, anxiety and not able to endorse suicidal  ideation, homicidal ideation, auditory or visual hallucinations during my evaluation. Past Psychiatric History: Patient has been diagnosed with schizoaffective disorder and has been receiving outpatient medication management from [facility]. She has one previous intentional overdose 2002 required acute psychiatric hospitalization.  Interval history: Patient seen for psychiatric consultation follow-up as requested by hospitalist. Patient seems to be stable medically and emotionally at her baseline. Patient denies current symptoms of depression, anxiety and has no evidence of auditory or visual hallucinations, delusions or paranoia. Patient contract for safety and willing to follow up with outpatient medication management. Patient also reportedly missing her dogs at home and asking to be discharged to home. Patient has no irritability, agitation or aggressive behaviors, patient has been cooperative with the staff while in the hospital.    Risk to Self: Is patient at risk for suicide?: No (Pt reports no thoughts or plans but took an overdose at some time before noon today) Risk to Others:   Prior Inpatient Therapy:   Prior Outpatient Therapy:    Past Medical History:  Past Medical History  Diagnosis Date  . Hypothyroid   . High cholesterol   . Acid reflux   . COPD (chronic obstructive pulmonary disease) ([place])   . Hypertension   . Schizoaffective disorder   . Breast cancer [facility])     Past Surgical History  Procedure Laterality Date  . Mastectomy, partial    . Appendectomy     Family History:  Family History  Problem Relation Age of Onset  . Heart attack Father   . Cancer Other    Family Psychiatric  History: Unknown Social History:  History  Alcohol Use No     History  Drug Use No    Social History   Social History  . Marital Status: Legally Separated  Spouse Name: N/A  . Number of Children: N/A  . Years of Education: N/A   Social History Main Topics  . Smoking status:  Current Every Day Smoker -- 1.00 packs/day    Types: Cigarettes, Cigars  . Smokeless tobacco: None  . Alcohol Use: No  . Drug Use: No  . Sexual Activity: Not Asked   Other Topics Concern  . None   Social History Narrative   Additional Social History:    Allergies:   Allergies  Allergen Reactions  . Budesonide Shortness Of Breath    Labs:  Results for orders placed or performed during the hospital encounter of 03/06/16 (from the past 48 hour(s))  Basic metabolic panel     Status: Abnormal   Collection Time: 03/14/16  4:44 AM  Result Value Ref Range   Sodium 134 (L) 135 - 145 mmol/L   Potassium 3.8 3.5 - 5.1 mmol/L   Chloride 102 101 - 111 mmol/L   CO2 22 22 - 32 mmol/L   Glucose, Bld 108 (H) 65 - 99 mg/dL   BUN 14 6 - 20 mg/dL   Creatinine, Ser 0.75 0.44 - 1.00 mg/dL   Calcium 9.5 8.9 - 10.3 mg/dL   GFR calc non Af Amer >60 >60 mL/min   GFR calc Af Amer >60 >60 mL/min    Comment: (NOTE) The eGFR has been calculated using the CKD EPI equation. This calculation has not been validated in all clinical situations. eGFR's persistently <60 mL/min signify possible Chronic Kidney Disease.    Anion gap 10 5 - 15  Magnesium     Status: Abnormal   Collection Time: 03/14/16  4:44 AM  Result Value Ref Range   Magnesium 1.5 (L) 1.7 - 2.4 mg/dL    Current Facility-Administered Medications  Medication Dose Route Frequency Provider Last Rate Last Dose  . albuterol (PROVENTIL) (2.5 MG/3ML) 0.083% nebulizer solution 2.5 mg  2.5 mg Nebulization Q4H PRN Jonetta Osgood, MD   2.5 mg at 03/11/16 0618  . atenolol (TENORMIN) tablet 100 mg  100 mg Oral BID Barton Dubois, MD   100 mg at 03/14/16 0944  . benztropine (COGENTIN) tablet 0.5 mg  0.5 mg Oral Daily Norval Morton, MD   0.5 mg at 03/14/16 0945  . diazepam (VALIUM) tablet 2 mg  2 mg Oral Q12H PRN Norval Morton, MD   2 mg at 03/14/16 0636  . enoxaparin (LOVENOX) injection 40 mg  40 mg Subcutaneous QHS Rondell A Tamala Julian, MD   40 mg  at 03/13/16 2113  . guaiFENesin (MUCINEX) 12 hr tablet 600 mg  600 mg Oral BID Rhetta Mura Schorr, NP   600 mg at 03/14/16 0945  . hydrALAZINE (APRESOLINE) injection 10 mg  10 mg Intravenous Q8H PRN Barton Dubois, MD   10 mg at 03/14/16 0542  . hydrALAZINE (APRESOLINE) tablet 50 mg  50 mg Oral 4 times per day Barton Dubois, MD   50 mg at 03/14/16 1330  . HYDROcodone-acetaminophen (NORCO/VICODIN) 5-325 MG per tablet 1-2 tablet  1-2 tablet Oral Q6H PRN Barton Dubois, MD   2 tablet at 03/14/16 4801  . hydrOXYzine (ATARAX/VISTARIL) tablet 25 mg  25 mg Oral TID PRN Norval Morton, MD   25 mg at 03/07/16 1503  . ipratropium (ATROVENT) nebulizer solution 0.5 mg  0.5 mg Nebulization Q2H PRN Norval Morton, MD      . levothyroxine (SYNTHROID, LEVOTHROID) tablet 100 mcg  100 mcg Oral QAC breakfast Norval Morton, MD  100 mcg at 03/14/16 0844  . liothyronine (CYTOMEL) tablet 12.5 mcg  12.5 mcg Oral BID Norval Morton, MD   12.5 mcg at 03/14/16 0945  . lisinopril (PRINIVIL,ZESTRIL) tablet 20 mg  20 mg Oral BID Norval Morton, MD   20 mg at 03/14/16 0945  . loperamide (IMODIUM) capsule 2 mg  2 mg Oral PRN Albertine Patricia, MD      . ondansetron [facility]) injection 4 mg  4 mg Intravenous Q6H PRN Hewitt Shorts [place], PA-C   4 mg at 03/07/16 1008  . PARoxetine (PAXIL) tablet 40 mg  40 mg Oral Daily Norval Morton, MD   40 mg at 03/14/16 0944  . pravastatin (PRAVACHOL) tablet 80 mg  80 mg Oral q1800 Norval Morton, MD   80 mg at 03/13/16 1709  . predniSONE (DELTASONE) tablet 20 mg  20 mg Oral Q breakfast Barton Dubois, MD   20 mg at 03/14/16 0844  . spironolactone (ALDACTONE) tablet 25 mg  25 mg Oral Daily Jonetta Osgood, MD   25 mg at 03/14/16 0945  . thiothixene (NAVANE) capsule 4 mg  4 mg Oral QHS Rondell A Tamala Julian, MD   4 mg at 03/13/16 2113  . zolpidem (AMBIEN) tablet 5 mg  5 mg Oral QHS PRN Norval Morton, MD   5 mg at 03/13/16 2156    Musculoskeletal: Strength & Muscle Tone: decreased Gait &  Station: unable to stand Patient leans: N/A  Psychiatric Specialty Exam: ROS  Blood pressure 156/63, pulse 86, temperature 97.9 F (36.6 C), temperature source Oral, resp. rate 20, height _0  (1.575 m), weight 66.361 kg (146 lb 4.8 oz), SpO2 96 %.Body mass index is 26.75 kg/(m^2).  General Appearance: Bizarre and Guarded  Eye Contact::  Good  Speech:  Clear and Coherent and Slow  Volume:  Decreased  Mood:  Depressed  Affect:  Constricted and Depressed  Thought Process:  Coherent and Goal Directed  Orientation:  Full (Time, Place, and Person)  Thought Content:  WDL  Suicidal Thoughts:  No  Homicidal Thoughts:  No  Memory:  Immediate;   Good Recent;   Poor Remote;   Poor  Judgement:  Impaired  Insight:  Fair  Psychomotor Activity:  Decreased  Concentration:  Fair  Recall:  [place]: Fair  Akathisia:  Negative  Handed:  Right   AIMS (if indicated):     Assets:  Communication Skills Desire for Improvement Financial Resources/Insurance Housing Leisure Time Social Support Transportation  ADL's:  Impaired  Cognition: Impaired,  Mild  Sleep:      Treatment Plan Summary: Manufacturing engineer as patient contract for safety Continue home medication except trazodone Paxil 40 mg daily for depression,  Thiothixene 40 mg daily at bedtime for psychosis,  Cogentin 0.5 mg daily for EPS  Patient will be referred to the [facility] for outpatient medication management Referred to the social service regarding disposition plans.  Disposition: Patient does not meet criteria for psychiatric inpatient admission. Supportive therapy provided about ongoing stressors.   Durward Parcel., MD 03/14/2016 2:19 PM

## 2016-03-14 NOTE — Discharge Summary (Signed)
Physician Discharge Summary  Audrey Meyer [id number] DOB: [birth year] DOA: 03/06/2016  PCP: Dwan Bolt, MD  Admit date: 03/06/2016 Discharge date: 03/14/2016  Time spent: 35 minutes  Recommendations for Outpatient Follow-up:  1. Repeat BMET to follow electrolytes and renal function 2. Follow BP and adjust antihypertensive regimen as needed    Discharge Diagnoses:  Principal Problem:   Overdose Active Problems:   CAP (community acquired pneumonia)   Hypokalemia   Hypothyroidism   Schizoaffective disorder ([place])   Essential hypertension   COPD exacerbation ([place])   Tobacco abuse   Discharge Condition: stable and improved. Will discharge home with follow up with PCP in 10 days and to follow at [facility] in the next 4-5 days for further adjustment on her psychiatric regimen.  Diet recommendation: heart healthy diet   Filed Weights   03/06/16 2119  Weight: 66.361 kg (146 lb 4.8 oz)    History of present illness:  As per H&P written by Dr. Tamala Julian on 03/06/16 60 year old female with past medical history significant for hypothyroidism, HLD, GERD, COPD, HTN, schizoaffective disorder, and breast cancer(in remission); who presents after her estranged husband called 34 for the patient reportedly taking 50 tablets of trazodone around 4 AM this morning. She was not noted to have ingested any alcohol or any other medicines. Patient was noted to have tried overdosing placed back 2002. Patient is seen at [facility] facilities for her mental health issues. She had just actually been seen in the emergency department yesterday complaining of nausea for the last 3 days with increased diarrhea, and persistent productive cough. Chest x-ray performed yesterday showed signs of by basilar opacities suggestive of a possible pneumonia. She was seen to have hypokalemia of 2.4 at that time for which they tried to give her potassium, but left AMA prior to receiving any potassium supplements. Patient has not  been taking her home potassium supplements as prescribed and unsure of other medications.  Hospital Course:  Trazodone overdose: Suicidal attempt with trazodone >48-72 hours prior to admission.  -Continue on telemetry (mainly for low K), no major arrhythmias seen.  -Psychiatry recommends outpatient therapy once medically stable. -discharge in stable condition; plan is to follow at [facility] for outpatient therapy  Hypokalemia, low phosphorus and hypomagnesemia : Secondary to GI loss from diarrhea and use of chlorthalidone for BP prior to admission. -Chlorthalidone has been discontinued -diarrhea has now improved -will continue electrolytes monitoring and repletion as needed -started on spironolactone and is on lisinopril   Diarrhea: History of microscopic colitis. After speaking with Dr. Benson Norway, recommendations was to start prednisone 20mg  -Diarrhea has significantly improved. -C.diff negative.  -Will continue monitoring and after 10 days of treatment; will initiate slow tapering. -diarrhea improved/resolved essentially  COPD: stable -Lungs clear, continue bronchodilators. -prednisone use for microscopic colitis will also help keeping airways open  Hypothyroidism:  -Continue Synthroid and Cytomel  Hypertension: Relatively well-controlled -Continue Atenolol, Lisinopril, hydralazine and Aldactone -followed vital signs and adjust further as needed   Dyslipidemia:  -Continue statin  History of major recurrent depression with schizoaffective disorder and psychosis: with acute exacerbation. Psych consulted and recommending inpatient treatment. -denies SI or hallucinations currently -will continue continue Paxil and thiotixene. -psychiatry felt she is good for outpatient therapy and clear her for discharge -trazodone discontinue   Chronic leg pain -continue PRN vicodin -chronic and neuropathic in nature    Procedures:  See below for x-ray reports    Consultations:  Psychiatry   Discharge Exam: Filed Vitals:   03/14/16 0943 03/14/16  1316  BP: 123/51 156/63  Pulse: 70 86  Temp: 97.7 F (36.5 C) 97.9 F (36.6 C)  Resp: 20 20    General: Awake, alert, with clear speech. Cooperative, answering questions appropriately. No fever. Patient denies SI or hallucinations. Flat affect appreciated on exam; no acute distress.  Cardiovascular: RRR, no murmurs.  Respiratory: CTA bilaterally, no use of accessory muscles  Abdomen: soft, nontender, nondistended +BS  Extremities: No edema, no cyanosis   Discharge Instructions   Discharge Instructions    Diet - low sodium heart healthy    Complete by:  As directed      Discharge instructions    Complete by:  As directed   Take medications as prescribed Follow at [facility] for outpatient psychiatry services  Follow heart healthy diet Keep yourself well hydrated          Current Discharge Medication List    START taking these medications   Details  atenolol (TENORMIN) 100 MG tablet Take 1 tablet (100 mg total) by mouth 2 (two) times daily. Qty: 60 tablet, Refills: 1    hydrALAZINE (APRESOLINE) 50 MG tablet Take 1.5 tablets (75 mg total) by mouth 3 (three) times daily. Qty: 120 tablet, Refills: 0    predniSONE (DELTASONE) 10 MG tablet Take 2 tablet daily X 2 days; then 1 tablet daily x 5 days; then 1/2 table daily X 5 days and stop prednisone Qty: 12 tablet, Refills: 0    spironolactone (ALDACTONE) 25 MG tablet Take 1 tablet (25 mg total) by mouth daily. Qty: 30 tablet, Refills: 1      CONTINUE these medications which have NOT CHANGED   Details  albuterol (PROVENTIL HFA;VENTOLIN HFA) 108 (90 BASE) MCG/ACT inhaler Inhale 2 puffs into the lungs every 6 (six) hours as needed. For shortness of breath    benztropine (COGENTIN) 0.5 MG tablet Take 0.5 mg by mouth daily. Reported on 03/06/2016    dextromethorphan (DELSYM) 30 MG/5ML liquid Take 30 mg by mouth as needed for cough.     diazepam (VALIUM) 5 MG tablet Take 5 mg by mouth every 12 (twelve) hours as needed. For anxiety    hydrOXYzine (VISTARIL) 25 MG capsule Take 25 mg by mouth 3 (three) times daily as needed for anxiety.    levothyroxine (SYNTHROID, LEVOTHROID) 100 MCG tablet Take 100 mcg by mouth daily.    liothyronine (CYTOMEL) 25 MCG tablet Take 12.5 mcg by mouth 2 (two) times daily.     lisinopril (PRINIVIL,ZESTRIL) 20 MG tablet Take 20 mg by mouth 2 (two) times daily.     Melatonin 10 MG CAPS Take 10 mg by mouth at bedtime.    PARoxetine (PAXIL) 40 MG tablet Take 40 mg by mouth every morning.    potassium chloride SA (K-DUR,KLOR-CON) 20 MEQ tablet Take 20 mEq by mouth 2 (two) times daily.    pravastatin (PRAVACHOL) 80 MG tablet Take 80 mg by mouth daily.    thiothixene (NAVANE) 2 MG capsule Take 4 mg by mouth at bedtime.      STOP taking these medications     atenolol-chlorthalidone (TENORETIC) 100-25 MG per tablet      pseudoephedrine-guaifenesin (MUCINEX D) 60-600 MG 12 hr tablet      traZODone (DESYREL) 100 MG tablet        Allergies  Allergen Reactions  . Budesonide Shortness Of Breath   Follow-up Information    Follow up with Dwan Bolt, MD. Schedule an appointment as soon as possible for a visit in 10 days.  Specialty:  Endocrinology   Contact information:   [address] [place]  [address] [phone]       Follow up with [facility]. Schedule an appointment as soon as possible for a visit in 4 days.   Specialty:  [facility] information:   [place]  [phone]        The results of significant diagnostics from this hospitalization (including imaging, microbiology, ancillary and laboratory) are listed below for reference.    Significant Diagnostic Studies: Dg Chest 2 View  03/06/2016  CLINICAL DATA:  Hypoxia EXAM: CHEST  2 VIEW COMPARISON:  03/06/2016 FINDINGS: Cardiac shadow is within normal  limits. The lungs are well aerated bilaterally. Persistent bibasilar changes are noted which may represent early infiltrate. The overall appearance is stable from this study obtained earlier in the same day. IMPRESSION: Persistent bibasilar changes. Electronically Signed   By: Inez Catalina M.D.   On: 03/06/2016 19:18   Dg Chest 2 View  03/06/2016  CLINICAL DATA:  Acute onset of worsening nausea, diarrhea and cough. Initial encounter. EXAM: CHEST  2 VIEW COMPARISON:  Chest radiograph performed 10/15/2012 FINDINGS: The lungs are well-aerated. Mild peribronchial thickening is noted. Minimal bibasilar opacities could reflect mild pneumonia, depending on the patient's symptoms. There is no evidence of pleural effusion or pneumothorax. The heart is borderline normal in size. No acute osseous abnormalities are seen. IMPRESSION: Minimal bibasilar opacities could reflect mild pneumonia, depending on the patient's symptoms. Mild peribronchial thickening noted. Electronically Signed   By: Garald Balding M.D.   On: 03/06/2016 03:35    Microbiology: Recent Results (from the past 240 hour(s))  Blood culture (routine x 2)     Status: None   Collection Time: 03/06/16  8:26 PM  Result Value Ref Range Status   Specimen Description BLOOD BLOOD RIGHT HAND  Final   Special Requests BOTTLES DRAWN AEROBIC AND ANAEROBIC 5ML EA  Final   Culture   Final    NO GROWTH 5 DAYS Performed at [facility]    Report Status 03/12/2016 FINAL  Final  Blood culture (routine x 2)     Status: None   Collection Time: 03/06/16  8:26 PM  Result Value Ref Range Status   Specimen Description BLOOD BLOOD LEFT HAND  Final   Special Requests BOTTLES DRAWN AEROBIC AND ANAEROBIC 5 ML EA  Final   Culture   Final    NO GROWTH 5 DAYS Performed at [facility]    Report Status 03/12/2016 FINAL  Final  C difficile quick scan w PCR reflex     Status: None   Collection Time: 03/07/16  2:56 PM  Result Value Ref Range Status   C  Diff antigen NEGATIVE NEGATIVE Final   C Diff toxin NEGATIVE NEGATIVE Final   C Diff interpretation Negative for toxigenic C. difficile  Final     Labs: Basic Metabolic Panel:  Recent Labs Lab 03/08/16 0446 03/09/16 1016 03/10/16 0431 03/11/16 0431 03/12/16 0515 03/14/16 0444  NA 132* 130* 132* 138 135 134*  K 2.9* 3.1* 3.0* 3.4* 4.5 3.8  CL 94* 94* 98* 104 105 102  CO2 28 28 24 23  21* 22  GLUCOSE 92 100* 112* 119* 98 108*  BUN 9 9 9 9 11 14   CREATININE 0.62 0.69 0.62 0.74 0.60 0.75  CALCIUM 8.4* 8.4* 8.4* 9.3 9.0 9.5  MG 1.7 1.5* 1.9  --   --  1.5*  PHOS  --  1.5*  --   --   --   --     Signed:  Barton Dubois MD.  [facility] 03/14/2016, 2:51 PM

## 2016-03-25 ENCOUNTER — Emergency Department (HOSPITAL_COMMUNITY)
Admission: EM | Admit: 2016-03-25 | Discharge: 2016-03-27 | Disposition: A | Discharge status: Other Acute Inpatient Hospital | Payer: Medicare Other | Attending: Emergency Medicine | Admitting: Emergency Medicine

## 2016-03-25 ENCOUNTER — Encounter (HOSPITAL_COMMUNITY): Payer: Self-pay | Admitting: Emergency Medicine

## 2016-03-25 ENCOUNTER — Emergency Department (HOSPITAL_COMMUNITY): Payer: Medicare Other

## 2016-03-25 DIAGNOSIS — J159 Unspecified bacterial pneumonia: Secondary | ICD-10-CM | POA: Diagnosis not present

## 2016-03-25 DIAGNOSIS — Z79899 Other long term (current) drug therapy: Secondary | ICD-10-CM | POA: Diagnosis not present

## 2016-03-25 DIAGNOSIS — Z853 Personal history of malignant neoplasm of breast: Secondary | ICD-10-CM | POA: Insufficient documentation

## 2016-03-25 DIAGNOSIS — F1310 Sedative, hypnotic or anxiolytic abuse, uncomplicated: Secondary | ICD-10-CM | POA: Diagnosis not present

## 2016-03-25 DIAGNOSIS — F329 Major depressive disorder, single episode, unspecified: Secondary | ICD-10-CM | POA: Diagnosis not present

## 2016-03-25 DIAGNOSIS — J441 Chronic obstructive pulmonary disease with (acute) exacerbation: Secondary | ICD-10-CM | POA: Insufficient documentation

## 2016-03-25 DIAGNOSIS — E039 Hypothyroidism, unspecified: Secondary | ICD-10-CM | POA: Insufficient documentation

## 2016-03-25 DIAGNOSIS — R443 Hallucinations, unspecified: Secondary | ICD-10-CM

## 2016-03-25 DIAGNOSIS — J189 Pneumonia, unspecified organism: Secondary | ICD-10-CM

## 2016-03-25 DIAGNOSIS — E7800 Pure hypercholesterolemia, unspecified: Secondary | ICD-10-CM | POA: Insufficient documentation

## 2016-03-25 DIAGNOSIS — F259 Schizoaffective disorder, unspecified: Secondary | ICD-10-CM | POA: Insufficient documentation

## 2016-03-25 DIAGNOSIS — Z8719 Personal history of other diseases of the digestive system: Secondary | ICD-10-CM | POA: Insufficient documentation

## 2016-03-25 DIAGNOSIS — I10 Essential (primary) hypertension: Secondary | ICD-10-CM | POA: Insufficient documentation

## 2016-03-25 DIAGNOSIS — R079 Chest pain, unspecified: Secondary | ICD-10-CM | POA: Diagnosis present

## 2016-03-25 DIAGNOSIS — F17210 Nicotine dependence, cigarettes, uncomplicated: Secondary | ICD-10-CM | POA: Diagnosis not present

## 2016-03-25 LAB — COMPREHENSIVE METABOLIC PANEL
ALT: 15 U/L (ref 14–54)
AST: 15 U/L (ref 15–41)
Albumin: 3.2 g/dL — ABNORMAL LOW (ref 3.5–5.0)
Alkaline Phosphatase: 47 U/L (ref 38–126)
Anion gap: 10 (ref 5–15)
BILIRUBIN TOTAL: 0.4 mg/dL (ref 0.3–1.2)
BUN: 18 mg/dL (ref 6–20)
CALCIUM: 8.8 mg/dL — AB (ref 8.9–10.3)
CO2: 18 mmol/L — ABNORMAL LOW (ref 22–32)
CREATININE: 0.96 mg/dL (ref 0.44–1.00)
Chloride: 102 mmol/L (ref 101–111)
Glucose, Bld: 95 mg/dL (ref 65–99)
Potassium: 3.5 mmol/L (ref 3.5–5.1)
Sodium: 130 mmol/L — ABNORMAL LOW (ref 135–145)
TOTAL PROTEIN: 6.4 g/dL — AB (ref 6.5–8.1)

## 2016-03-25 LAB — I-STAT TROPONIN, ED: Troponin i, poc: 0 ng/mL (ref 0.00–0.08)

## 2016-03-25 LAB — CBC WITH DIFFERENTIAL/PLATELET
BASOS PCT: 0 %
Basophils Absolute: 0 10*3/uL (ref 0.0–0.1)
EOS ABS: 0.1 10*3/uL (ref 0.0–0.7)
Eosinophils Relative: 1 %
HEMATOCRIT: 36.6 % (ref 36.0–46.0)
HEMOGLOBIN: 12.5 g/dL (ref 12.0–15.0)
LYMPHS ABS: 4.1 10*3/uL — AB (ref 0.7–4.0)
Lymphocytes Relative: 29 %
MCH: 30.3 pg (ref 26.0–34.0)
MCHC: 34.2 g/dL (ref 30.0–36.0)
MCV: 88.6 fL (ref 78.0–100.0)
MONO ABS: 1.2 10*3/uL — AB (ref 0.1–1.0)
Monocytes Relative: 9 %
NEUTROS ABS: 8.9 10*3/uL — AB (ref 1.7–7.7)
NEUTROS PCT: 61 %
Platelets: 341 10*3/uL (ref 150–400)
RBC: 4.13 MIL/uL (ref 3.87–5.11)
RDW: 13.3 % (ref 11.5–15.5)
WBC: 14.3 10*3/uL — ABNORMAL HIGH (ref 4.0–10.5)

## 2016-03-25 LAB — SALICYLATE LEVEL
SALICYLATE LVL: 10.9 mg/dL (ref 2.8–30.0)
Salicylate Lvl: 14.2 mg/dL (ref 2.8–30.0)
Salicylate Lvl: 13.7 mg/dL (ref 2.8–30.0)

## 2016-03-25 LAB — ACETAMINOPHEN LEVEL
ACETAMINOPHEN (TYLENOL), SERUM: 21 ug/mL (ref 10–30)
Acetaminophen (Tylenol), Serum: 21 ug/mL (ref 10–30)
Acetaminophen (Tylenol), Serum: 10 ug/mL (ref 10–30)

## 2016-03-25 LAB — RAPID URINE DRUG SCREEN, HOSP PERFORMED
Amphetamines: NOT DETECTED
Barbiturates: NOT DETECTED
Benzodiazepines: POSITIVE — AB
Cocaine: NOT DETECTED
OPIATES: NOT DETECTED
Tetrahydrocannabinol: NOT DETECTED

## 2016-03-25 LAB — URINALYSIS, ROUTINE W REFLEX MICROSCOPIC
Bilirubin Urine: NEGATIVE
Glucose, UA: NEGATIVE mg/dL
HGB URINE DIPSTICK: NEGATIVE
KETONES UR: NEGATIVE mg/dL
NITRITE: NEGATIVE
PH: 5 (ref 5.0–8.0)
PROTEIN: NEGATIVE mg/dL
Specific Gravity, Urine: 1.014 (ref 1.005–1.030)

## 2016-03-25 LAB — URINE MICROSCOPIC-ADD ON: RBC / HPF: NONE SEEN RBC/hpf (ref 0–5)

## 2016-03-25 LAB — ETHANOL

## 2016-03-25 MED ORDER — LEVOTHYROXINE SODIUM 100 MCG PO TABS: 100.0000 ug | ORAL_TABLET | Freq: Every day | ORAL | Status: DC

## 2016-03-25 MED ORDER — BENZTROPINE MESYLATE 1 MG PO TABS
0.5000 mg | ORAL_TABLET | Freq: Every day | ORAL | Status: DC
  Administered 2016-03-25 – 2016-03-27 (×3): 0.5 mg via ORAL
  Filled 2016-03-25 (×3): qty 1

## 2016-03-25 MED ORDER — LEVOFLOXACIN 750 MG PO TABS
750.0000 mg | ORAL_TABLET | Freq: Once | ORAL | Status: AC
  Administered 2016-03-25: 750 mg via ORAL
  Filled 2016-03-25: qty 1

## 2016-03-25 MED ORDER — LORAZEPAM 1 MG PO TABS
1.0000 mg | ORAL_TABLET | Freq: Three times a day (TID) | ORAL | Status: DC | PRN
  Administered 2016-03-25: 1 mg via ORAL
  Filled 2016-03-25: qty 1

## 2016-03-25 MED ORDER — LISINOPRIL 20 MG PO TABS
20.0000 mg | ORAL_TABLET | Freq: Two times a day (BID) | ORAL | Status: DC
  Administered 2016-03-25 – 2016-03-26 (×2): 20 mg via ORAL
  Filled 2016-03-25 (×2): qty 1

## 2016-03-25 MED ORDER — ACETAMINOPHEN 325 MG PO TABS
650.0000 mg | ORAL_TABLET | ORAL | Status: DC | PRN
  Administered 2016-03-25 – 2016-03-26 (×3): 650 mg via ORAL
  Filled 2016-03-25 (×4): qty 2

## 2016-03-25 MED ORDER — ALUM & MAG HYDROXIDE-SIMETH 200-200-20 MG/5ML PO SUSP: 30.0000 mL | ORAL | Status: DC | PRN

## 2016-03-25 MED ORDER — POTASSIUM CHLORIDE CRYS ER 20 MEQ PO TBCR
20.0000 meq | EXTENDED_RELEASE_TABLET | Freq: Two times a day (BID) | ORAL | Status: DC
  Administered 2016-03-25 – 2016-03-27 (×4): 20 meq via ORAL
  Filled 2016-03-25 (×4): qty 1

## 2016-03-25 MED ORDER — ALBUTEROL SULFATE HFA 108 (90 BASE) MCG/ACT IN AERS: 2.0000 | INHALATION_SPRAY | Freq: Four times a day (QID) | RESPIRATORY_TRACT | Status: DC | PRN

## 2016-03-25 MED ORDER — HYDRALAZINE HCL 25 MG PO TABS
75.0000 mg | ORAL_TABLET | Freq: Three times a day (TID) | ORAL | Status: DC
  Administered 2016-03-25 – 2016-03-26 (×2): 75 mg via ORAL
  Filled 2016-03-25 (×2): qty 3

## 2016-03-25 MED ORDER — LEVOFLOXACIN 750 MG PO TABS
750.0000 mg | ORAL_TABLET | Freq: Every day | ORAL | Status: DC
  Administered 2016-03-26 – 2016-03-27 (×2): 750 mg via ORAL
  Filled 2016-03-25 (×3): qty 1

## 2016-03-25 MED ORDER — MELATONIN 3 MG PO TABS
10.0000 mg | ORAL_TABLET | Freq: Every day | ORAL | Status: DC
  Administered 2016-03-25 – 2016-03-26 (×2): 10.5 mg via ORAL
  Filled 2016-03-25 (×2): qty 3.5

## 2016-03-25 MED ORDER — IBUPROFEN 800 MG PO TABS
800.0000 mg | ORAL_TABLET | Freq: Four times a day (QID) | ORAL | Status: DC | PRN
  Administered 2016-03-25 – 2016-03-26 (×2): 800 mg via ORAL
  Filled 2016-03-25 (×2): qty 1

## 2016-03-25 MED ORDER — LEVOTHYROXINE SODIUM 100 MCG PO TABS
100.0000 ug | ORAL_TABLET | Freq: Every day | ORAL | Status: DC
  Administered 2016-03-27: 100 ug via ORAL
  Filled 2016-03-25: qty 1

## 2016-03-25 MED ORDER — ATENOLOL 25 MG PO TABS
100.0000 mg | ORAL_TABLET | Freq: Two times a day (BID) | ORAL | Status: DC
Start: 2016-03-25 — End: 2016-03-27
  Administered 2016-03-25: 100 mg via ORAL
  Filled 2016-03-25 (×2): qty 4

## 2016-03-25 MED ORDER — PRAVASTATIN SODIUM 40 MG PO TABS
80.0000 mg | ORAL_TABLET | Freq: Every day | ORAL | Status: DC
  Administered 2016-03-25 – 2016-03-27 (×3): 80 mg via ORAL
  Filled 2016-03-25 (×3): qty 2

## 2016-03-25 MED ORDER — LIOTHYRONINE SODIUM 25 MCG PO TABS
12.5000 ug | ORAL_TABLET | Freq: Two times a day (BID) | ORAL | Status: DC
  Administered 2016-03-25 – 2016-03-27 (×4): 12.5 ug via ORAL
  Filled 2016-03-25 (×4): qty 1

## 2016-03-25 MED ORDER — PAROXETINE HCL 20 MG PO TABS
40.0000 mg | ORAL_TABLET | ORAL | Status: DC
  Administered 2016-03-26 – 2016-03-27 (×2): 40 mg via ORAL
  Filled 2016-03-25 (×2): qty 2

## 2016-03-25 NOTE — ED Notes (Signed)
Patient resting on stretcher; relieved for a lunch break by Tommi Rumps, EMT

## 2016-03-25 NOTE — ED Notes (Signed)
Gerald Stabs, EMT repeating blood work and patient is going back to have another xray of her chest

## 2016-03-25 NOTE — ED Notes (Addendum)
Patient has returned from being out of the department for testing; was escorted to Radiology Department and back by [facility], EMT

## 2016-03-25 NOTE — ED Notes (Signed)
Staffing office called.

## 2016-03-25 NOTE — Progress Notes (Addendum)
Patient has been under review at [facility]. Per [facility] RN patient may need a 1:1 room as pt attempted to run away 3 times. Patient has been calm and cooperative in the past few hours, per [facility] RN. Calvin LCSW at [facility] has been informed and reports no [address] beds at this moment. This Probation officer to seek placement at other facilities.  Verlon Setting, [place] Disposition staff 03/25/2016 6:33 PM

## 2016-03-25 NOTE — BH Assessment (Addendum)
Tele Assessment Note   Audrey Meyer is an 60 y.o. female who was brought to the Emergency Department by EMS after residents of her apartment complex were complaining about her "running around topless" in the parking lot. Per notes she was previously brought to the Emergency Department on April 2 with an intentional overdose on 50 tabs of trazodone. After being in the hospital for a week she was psychiatrically cleared and was given resources on April 10th. Pt was not oriented during assessment and states that she died and is in heaven right now with her dog Chloe. She states that she died a couple days ago and her dog died yesterday. She states that she is glad she is in heaven with Chloe so "she never has to be separated from her." Pt currently lives alone and is separated from her husband however he is still involved in her life and picked her up last night she was discharged. Client denies SI, HI or A/V hallucinations because she "is in heaven" and "she can't die if she is already in heaven". Pt has a history of breast cancer but per record is in remission. She states that she takes medication from [facility] but "doesn't need it now she is in heaven so she hasn't been taking it". Unsure if pt has been to inpatient treatment in the past. Could not find this in her chart and pt is a poor historian and is not oriented at this time.   Per Elmarie Shiley, NP- inpatient recommended once medically cleared.    Diagnosis: Schizoaffective Disorder  Past Medical History:  Past Medical History  Diagnosis Date  . Hypothyroid   . High cholesterol   . Acid reflux   . COPD (chronic obstructive pulmonary disease) ([place])   . Hypertension   . Schizoaffective disorder   . Breast cancer [facility])     Past Surgical History  Procedure Laterality Date  . Mastectomy, partial    . Appendectomy      Family History:  Family History  Problem Relation Age of Onset  . Heart attack Father   . Cancer Other     Social  History:  reports that she has been smoking Cigarettes and Cigars.  She has been smoking about 1.00 pack per day. She does not have any smokeless tobacco history on file. She reports that she does not drink alcohol or use illicit drugs.  Additional Social History:  Alcohol / Drug Use History of alcohol / drug use?: No history of alcohol / drug abuse  CIWA: CIWA-Ar BP: (!) 105/52 mmHg Pulse Rate: (!) 50 COWS:    PATIENT STRENGTHS: (choose at least two) Average or above average intelligence General fund of knowledge  Allergies:  Allergies  Allergen Reactions  . Budesonide Shortness Of Breath    Home Medications:  (Not in a hospital admission)  OB/GYN Status:  No LMP recorded. Patient is postmenopausal.  General Assessment Data Location of Assessment: [facility] ED TTS Assessment: In system Is this a Tele or Face-to-Face Assessment?: Tele Assessment Is this an Initial Assessment or a Re-assessment for this encounter?: Initial Assessment Marital status: Separated Is patient pregnant?: No Pregnancy Status: No Living Arrangements: Alone Can pt return to current living arrangement?: Yes Admission Status: Voluntary Is patient capable of signing voluntary admission?: Yes (possibly- pt is not oriented at this time may need IVC) Referral Source: Other (complaints from residents at her apartment complex called[employer]) Insurance type: Medicare     Crisis Care Plan Living Arrangements: Alone  Name of Psychiatrist: Beverly Sessions Name of Therapist: None  Education Status Is patient currently in school?: No Highest grade of school patient has completed: unknown  Risk to self with the past 6 months Suicidal Ideation: No Has patient been a risk to self within the past 6 months prior to admission? : Yes Suicidal Intent: No Has patient had any suicidal intent within the past 6 months prior to admission? : Yes Is patient at risk for suicide?: Yes Suicidal Plan?: No Has patient had any suicidal plan  within the past 6 months prior to admission? : Yes Access to Means: Yes Specify Access to Suicidal Means: access to medication to OD What has been your use of drugs/alcohol within the last 12 months?: Denies substance use Previous Attempts/Gestures: Yes How many times?: 1 Other Self Harm Risks: not oriented Triggers for Past Attempts: Unknown Intentional Self Injurious Behavior: None Family Suicide History: Unknown Recent stressful life event(s): Loss (Comment) (states that her dog passed away 2 days ago) Persecutory voices/beliefs?:  (denies- provider concerned- hx of) Depression: Yes Depression Symptoms: Despondent, Tearfulness, Isolating Substance abuse history and/or treatment for substance abuse?: No Suicide prevention information given to non-admitted patients: Not applicable  Risk to Others within the past 6 months Homicidal Ideation: No Does patient have any lifetime risk of violence toward others beyond the six months prior to admission? : No Thoughts of Harm to Others: No Current Homicidal Intent: No Current Homicidal Plan: No Access to Homicidal Means: No Identified Victim: none History of harm to others?: No Assessment of Violence: None Noted Violent Behavior Description: none Does patient have access to weapons?: No Criminal Charges Pending?: No Does patient have a court date: No Is patient on probation?: No  Psychosis Hallucinations: None noted Delusions: Grandiose (believes she is in heaven right now with her dog)  Mental Status Report Appearance/Hygiene: Bizarre Eye Contact: Fair Motor Activity: Freedom of movement Speech: Incoherent Level of Consciousness: Quiet/awake Mood: Suspicious, Irritable, Sullen Affect: Blunted, Irritable Anxiety Level: None Thought Processes: Irrelevant Judgement: Impaired Orientation: Not oriented Obsessive Compulsive Thoughts/Behaviors: Unable to Assess  Cognitive Functioning Concentration: Unable to Assess Memory:  Unable to Assess IQ: Average Insight: Poor Impulse Control: Poor Appetite:  (UTA- she states that she can "eat when she wants to in heave) Weight Loss:  (unknown) Weight Gain:  (unknown) Sleep: Unable to Assess Vegetative Symptoms: Unable to Assess  ADLScreening [facility] Assessment Services) Patient's cognitive ability adequate to safely complete daily activities?: Yes Patient able to express need for assistance with ADLs?:  (pt not oriented may have difficulty with this task- thinks she is in heaven) Independently performs ADLs?: Yes (appropriate for developmental age)  Prior Inpatient Therapy Prior Inpatient Therapy:  (Unknown)  Prior Outpatient Therapy Prior Outpatient Therapy: Yes Prior Therapy Dates: ongoing Prior Therapy Facilty/Provider(s): [facility] Reason for Treatment: Schizoaffective Disorder Does patient have an ACCT team?: No Does patient have Intensive In-House Services?  : No Does patient have [facility] services? : Yes Does patient have P4CC services?: No  ADL Screening (condition at time of admission) Patient's cognitive ability adequate to safely complete daily activities?: Yes Is the patient deaf or have difficulty hearing?: No Does the patient have difficulty seeing, even when wearing glasses/contacts?: No Does the patient have difficulty concentrating, remembering, or making decisions?: Yes Patient able to express need for assistance with ADLs?:  (pt not oriented may have difficulty with this task- thinks she is in heaven) Does the patient have difficulty dressing or bathing?: No Independently performs ADLs?: Yes (appropriate  for developmental age) Does the patient have difficulty walking or climbing stairs?: Yes Weakness of Legs: Both Weakness of Arms/Hands: Both  Home Assistive Devices/Equipment Home Assistive Devices/Equipment: None  Therapy Consults (therapy consults require a physician order) PT Evaluation Needed: No OT Evalulation Needed: No SLP  Evaluation Needed: No Abuse/Neglect Assessment (Assessment to be complete while patient is alone) Physical Abuse:  (UTA- pt not oriented) Verbal Abuse:  (UTA- pt not oriented) Sexual Abuse:  (UTA- pt not oriented) Exploitation of patient/patient's resources:  (UTA- pt not oriented) Self-Neglect:  (UTA- pt not oriented) Values / Beliefs Cultural Requests During Hospitalization:  (UTA- pt not oriented) Spiritual Requests During Hospitalization:  (UTA- pt not oriented) Consults Spiritual Care Consult Needed: No Social Work Consult Needed: No Regulatory affairs officer (For Healthcare) Does patient have an advance directive?:  (unknown pt not oriented) Would patient like information on creating an advanced directive?:  (Unknown pt not oriented) Nutrition Screen- [facility] Adult/[facility]/AP Patient's home diet:  (Pt not oriented Not able to assess )  Additional Information 1:1 In Past 12 Months?: No CIRT Risk: No Elopement Risk: Yes Does patient have medical clearance?: Yes     Disposition:  Disposition Initial Assessment Completed for this Encounter: Yes Disposition of Patient: Inpatient treatment program Type of inpatient treatment program: Adult  Meena Barrantes 03/25/2016 12:24 PM

## 2016-03-25 NOTE — ED Notes (Signed)
Pt requesting medication for headache, refusing tylenol states she can't have it d/t hx of overdose. Wants excedrin. Notified PA Dansie, see new orders. PA doesn't want pt to have caffeine this late at night.

## 2016-03-25 NOTE — ED Notes (Signed)
O2% while ambulating- 98-100% RA Pulse while ambulating- 75-80bpm

## 2016-03-25 NOTE — ED Notes (Signed)
Pt asking for vicodin, states she's been taking it for years for leg pains. Notified Dr. Stark Jock, who orders tylenol.

## 2016-03-25 NOTE — ED Notes (Signed)
Patient still eating her lunch and watching television; no needs at this time

## 2016-03-25 NOTE — ED Notes (Signed)
Patient laying on stretcher watching television; coke given to patient to drink

## 2016-03-25 NOTE — ED Provider Notes (Signed)
CSN: [id number]     Arrival date & time 03/25/16  1012 History   First MD Initiated Contact with Patient 03/25/16 1012     Chief Complaint  Patient presents with  . Chest Pain   Audrey Meyer is a 60 y.o. female who presents to the ED via EMS complaining of substernal, non-radiating chest pain for the past 3 days. The patient was admitted for CAP, and trazodone overdose about 11 days ago. She reports one of the medications she was started on made her sleepy so 4 days ago she stopped all of her medications. She reports she did not finish her antibiotics. EMS reports that [employer] was called out to her house today for erratic behavior, i.e. Walking around her house with her shirt off. She told [employer] she has been having chest pain. The patient tells me she is having 10 out of 10 substernal, nonradiating chest pain constant for the past 3 days. She also reports associated cough and shortness of breath. She denies SI or HI. She denies personal or close early family history of MI. She denies fevers, leg pain, abdominal pain, vomiting, diarrhea, rashes, urinary symptoms, lightheadedness or syncope.    Patient is a 60 y.o. female presenting with chest pain. The history is provided by the patient, medical records and the EMS personnel. No language interpreter was used.  Chest Pain Associated symptoms: cough, nausea and shortness of breath   Associated symptoms: no abdominal pain, no back pain, no fever, no headache, no palpitations and not vomiting     Past Medical History  Diagnosis Date  . Hypothyroid   . High cholesterol   . Acid reflux   . COPD (chronic obstructive pulmonary disease) ([place])   . Hypertension   . Schizoaffective disorder   . Breast cancer [facility])    Past Surgical History  Procedure Laterality Date  . Mastectomy, partial    . Appendectomy     Family History  Problem Relation Age of Onset  . Heart attack Father   . Cancer Other    Social History  Substance Use Topics  . Smoking  status: Current Every Day Smoker -- 1.00 packs/day    Types: Cigarettes, Cigars  . Smokeless tobacco: None  . Alcohol Use: No   OB History    No data available     Review of Systems  Constitutional: Negative for fever and chills.  HENT: Negative for congestion and sore throat.   Eyes: Negative for visual disturbance.  Respiratory: Positive for cough and shortness of breath. Negative for wheezing.   Cardiovascular: Positive for chest pain. Negative for palpitations and leg swelling.  Gastrointestinal: Positive for nausea. Negative for vomiting, abdominal pain and diarrhea.  Genitourinary: Negative for dysuria.  Musculoskeletal: Negative for back pain and neck pain.  Skin: Negative for rash.  Neurological: Negative for syncope, light-headedness and headaches.  Psychiatric/Behavioral: Negative for suicidal ideas.      Allergies  Budesonide  Home Medications   Prior to Admission medications   Medication Sig Start Date End Date Taking? Authorizing Provider  albuterol (PROVENTIL HFA;VENTOLIN HFA) 108 (90 BASE) MCG/ACT inhaler Inhale 2 puffs into the lungs every 6 (six) hours as needed. For shortness of breath    Historical Provider, MD  atenolol (TENORMIN) 100 MG tablet Take 1 tablet (100 mg total) by mouth 2 (two) times daily. 03/14/16   Barton Dubois, MD  benztropine (COGENTIN) 0.5 MG tablet Take 0.5 mg by mouth daily. Reported on 03/06/2016    Historical  Provider, MD  dextromethorphan (DELSYM) 30 MG/5ML liquid Take 30 mg by mouth as needed for cough.    Historical Provider, MD  diazepam (VALIUM) 5 MG tablet Take 5 mg by mouth every 12 (twelve) hours as needed. For anxiety    Historical Provider, MD  hydrALAZINE (APRESOLINE) 50 MG tablet Take 1.5 tablets (75 mg total) by mouth 3 (three) times daily. 03/14/16   Barton Dubois, MD  hydrOXYzine (VISTARIL) 25 MG capsule Take 25 mg by mouth 3 (three) times daily as needed for anxiety.    Historical Provider, MD  levothyroxine (SYNTHROID,  LEVOTHROID) 100 MCG tablet Take 100 mcg by mouth daily.    Historical Provider, MD  liothyronine (CYTOMEL) 25 MCG tablet Take 12.5 mcg by mouth 2 (two) times daily.     Historical Provider, MD  lisinopril (PRINIVIL,ZESTRIL) 20 MG tablet Take 20 mg by mouth 2 (two) times daily.     Historical Provider, MD  Melatonin 10 MG CAPS Take 10 mg by mouth at bedtime.    Historical Provider, MD  PARoxetine (PAXIL) 40 MG tablet Take 40 mg by mouth every morning.    Historical Provider, MD  potassium chloride SA (K-DUR,KLOR-CON) 20 MEQ tablet Take 20 mEq by mouth 2 (two) times daily.    Historical Provider, MD  pravastatin (PRAVACHOL) 80 MG tablet Take 80 mg by mouth daily.    Historical Provider, MD  predniSONE (DELTASONE) 10 MG tablet Take 2 tablet daily X 2 days; then 1 tablet daily x 5 days; then 1/2 table daily X 5 days and stop prednisone 03/14/16   Barton Dubois, MD  spironolactone (ALDACTONE) 25 MG tablet Take 1 tablet (25 mg total) by mouth daily. 03/14/16   Barton Dubois, MD  thiothixene (NAVANE) 2 MG capsule Take 4 mg by mouth at bedtime.    Historical Provider, MD   BP 112/71 mmHg  Pulse 75  Temp(Src) 97.7 F (36.5 C) (Oral)  Resp 18  SpO2 99% Physical Exam  Constitutional: She is oriented to person, place, and time. She appears well-developed and well-nourished. No distress.  Non-toxic appearing. Patient is lying with her eyes closed in the bed. She is alert.  HENT:  Head: Normocephalic and atraumatic.  Mouth/Throat: Oropharynx is clear and moist.  Eyes: Conjunctivae are normal. Pupils are equal, round, and reactive to light. Right eye exhibits no discharge. Left eye exhibits no discharge.  Neck: Neck supple. No JVD present. No tracheal deviation present.  Cardiovascular: Regular rhythm, normal heart sounds and intact distal pulses.  Exam reveals no gallop and no friction rub.   No murmur heard. Heart rate of 50. Sinus bradycardia on the monitor. Bilateral radial and posterior tibialis  pulses are intact. Good capillary refill.  Pulmonary/Chest: Effort normal and breath sounds normal. No stridor. No respiratory distress. She has no wheezes. She has no rales. She exhibits tenderness.  Lung sounds are slightly diminished in her bilateral bases. Lungs otherwise clear to auscultation. No increased work of breathing. Substernal chest wall is tender to palpation reproduces her chest pain.  Abdominal: Soft. Bowel sounds are normal. She exhibits no distension. There is no tenderness. There is no guarding.  Abdomen is soft and nontender to palpation.  Musculoskeletal: She exhibits no edema or tenderness.  No lower extremity edema or tenderness.  Lymphadenopathy:    She has no cervical adenopathy.  Neurological: She is alert and oriented to person, place, and time. Coordination normal.  The patient is alert and oriented 3.  Skin: Skin is warm and  dry. No rash noted. She is not diaphoretic. No erythema. No pallor.  Psychiatric: Her speech is normal. She is withdrawn. She is not actively hallucinating. She exhibits a depressed mood. She expresses no homicidal and no suicidal ideation.  The patient appears withdrawn and depressed. She denies suicidal or homicidal ideations. She does not appear to be hallucinating or responding to internal stimuli.  Nursing note and vitals reviewed.   ED Course  Procedures (including critical care time) Labs Review Labs Reviewed  CBC WITH DIFFERENTIAL/PLATELET - Abnormal; Notable for the following:    WBC 14.3 (*)    Neutro Abs 8.9 (*)    Lymphs Abs 4.1 (*)    Monocytes Absolute 1.2 (*)    All other components within normal limits  COMPREHENSIVE METABOLIC PANEL - Abnormal; Notable for the following:    Sodium 130 (*)    CO2 18 (*)    Calcium 8.8 (*)    Total Protein 6.4 (*)    Albumin 3.2 (*)    All other components within normal limits  URINE RAPID DRUG SCREEN, HOSP PERFORMED - Abnormal; Notable for the following:    Benzodiazepines POSITIVE  (*)    All other components within normal limits  URINALYSIS, ROUTINE W REFLEX MICROSCOPIC (NOT AT [facility]) - Abnormal; Notable for the following:    APPearance CLOUDY (*)    Leukocytes, UA TRACE (*)    All other components within normal limits  URINE MICROSCOPIC-ADD ON - Abnormal; Notable for the following:    Squamous Epithelial / LPF 0-5 (*)    Bacteria, UA RARE (*)    Casts HYALINE CASTS (*)    All other components within normal limits  ETHANOL  ACETAMINOPHEN LEVEL  SALICYLATE LEVEL  SALICYLATE LEVEL  ACETAMINOPHEN LEVEL  ACETAMINOPHEN LEVEL  SALICYLATE LEVEL  I-STAT TROPOININ, ED    Imaging Review Dg Chest 2 View  03/25/2016  CLINICAL DATA:  60 year old female history of chest pain. Medical record demonstrates leukocytosis. EXAM: CHEST - 2 VIEW COMPARISON:  03/06/2016, 03/06/2016, 10/15/2012 FINDINGS: Cardiomediastinal silhouette unchanged. Atherosclerotic cysts of the aortic arch. No evidence of pulmonary vascular congestion. Persisting coarsened interstitial markings over the course of multiple examination. Ill-defined opacity at the right base persists from the prior, more confluent on the current. Stigmata of emphysema, with increased retrosternal airspace, flattened hemidiaphragms, increased AP diameter, and hyperinflation on the AP view. No pneumothorax. IMPRESSION: Persisting opacity at the right base, concerning for pneumonia given the history. Followup PA and lateral chest X-ray is recommended in 3-4 weeks following trial of antibiotic therapy to ensure resolution and exclude underlying malignancy. Atherosclerosis Signed, Dulcy Fanny. Earleen Newport, DO [facility] Electronically Signed   By: Corrie Mckusick D.O.   On: 03/25/2016 14:50   I have personally reviewed and evaluated these images and lab results as part of my medical decision-making.   EKG Interpretation   Date/Time:  Friday March 25 2016 10:22:55 EDT Ventricular Rate:   47 PR Interval:  155 QRS Duration: 103 QT Interval:  487 QTC Calculation: 431 R Axis:   89 Text Interpretation:  Sinus bradycardia Borderline right axis deviation No  significant change since 03/08/2016 Confirmed by DELO  MD, DOUGLAS ([id number])  on 03/25/2016 5:20:34 PM     Filed Vitals:   03/25/16 1023 03/25/16 1524 03/25/16 1630  BP: 105/52 112/71   Pulse: 50 53 75  Temp: 98.1 F (36.7 C) 97.7 F (36.5 C)   TempSrc: Oral Oral   Resp: 17 18  SpO2: 97% 99% 99%    MDM   Meds given in ED:  Medications  alum & mag hydroxide-simeth (MAALOX/MYLANTA) 200-200-20 MG/5ML suspension 30 mL (not administered)  acetaminophen (TYLENOL) tablet 650 mg (650 mg Oral Given 03/25/16 1841)  LORazepam (ATIVAN) tablet 1 mg (not administered)  albuterol (PROVENTIL HFA;VENTOLIN HFA) 108 (90 Base) MCG/ACT inhaler 2 puff (not administered)  atenolol (TENORMIN) tablet 100 mg (not administered)  benztropine (COGENTIN) tablet 0.5 mg (0.5 mg Oral Given 03/25/16 1842)  hydrALAZINE (APRESOLINE) tablet 75 mg (75 mg Oral Not Given 03/25/16 1952)  liothyronine (CYTOMEL) tablet 12.5 mcg (not administered)  lisinopril (PRINIVIL,ZESTRIL) tablet 20 mg (not administered)  Melatonin TABS 10.5 mg (not administered)  PARoxetine (PAXIL) tablet 40 mg (not administered)  potassium chloride SA (K-DUR,KLOR-CON) CR tablet 20 mEq (not administered)  pravastatin (PRAVACHOL) tablet 80 mg (80 mg Oral Given 03/25/16 1842)  levofloxacin (LEVAQUIN) tablet 750 mg (not administered)  levothyroxine (SYNTHROID, LEVOTHROID) tablet 100 mcg (not administered)  levofloxacin (LEVAQUIN) tablet 750 mg (750 mg Oral Given 03/25/16 1650)    New Prescriptions   No medications on file    Final diagnoses:  Schizoaffective disorder, unspecified type ([place])  Hallucinations  CAP (community acquired pneumonia)   This is a 60 y.o. female who presents to the ED via EMS complaining of substernal, non-radiating chest pain for the past 3 days. The patient  was admitted for CAP, and trazodone overdose about 11 days ago. She reports one of the medications she was started on made her sleepy so 4 days ago she stopped all of her medications. She reports she did not finish her antibiotics. EMS reports that [employer] was called out to her house today for erratic behavior, i.e. Walking around her house with her shirt off. She told [employer] she has been having chest pain. The patient tells me she is having 10 out of 10 substernal, nonradiating chest pain constant for the past 3 days. She also reports associated cough and shortness of breath. On exam the patient is afebrile and nontoxic-appearing. She is slightly diminished lung sounds bilateral bases. She is not tachypneic, tachycardic or hypoxic.  Initially, the patient is calm and cooperative. She denies SI or HI and does not appear to be hallucinating. Later, the patient wandered off and tells nursing staff she was walking her dog. She tells me she has gone to heaven with her dog who is also dead. She is hallucinating. Will have TTS consult. I suspect this is related the patient stopping all of her psychiatric medications.  We are concerned for the patient's safety. After a discussion with my attending, Dr. Roderic Palau, will take out IVC orders.   Troponin is 0. EKG shows no change from her last tracing.  The patient's initial acetaminophen level was 21 and salicylate level is 123XX123. Patient tells me she did take an Excedrin Migraine today. As the patient has been hallucinating and has history of trazodone overdose. I'm concerned that she may have taken an overdose again despite her denying this. Will recheck a acetaminophen and salicylate level to ensure their levels are decreasing. I placed a second order for a salicylate and acetaminophen level and the lab accidentally repeated the levels on old blood. The redraw on levels show decreasing amounts. No concern for overdose.  Ethanol level is 0. CBC is remarkable for a white  count of 14,000. CMP shows a mild hyponatremia with a sodium of 1:30. Patient is tolerating by mouth.  Chest x-ray shows an opacity at the right  base concerning for pneumonia. Patient was diagnosed with community-acquired pneumonia on April 10 during her trazodone overdose. She was started on antibiotics in the emergency department based on chart review I see no evidence of her being discharged on antibiotics. Despite this she has stopped all her medications 4 days ago. She ambulated in the ED with pulse ox and had no hypoxia or SOB. After a discussion with my attending, Dr. Roderic Palau, will still treat the patient for CAP. Will treat with levaquin and she received her first dose tonight. I placed orders for her to receive her 4 remaining doses if she will remain in the ED while we are awaiting psych placement.   Behavioral health reports she meets inpatient criteria and will look for psych placement. Psych holding orders placed. Most home medications re ordered. I did not restart her Navane as we are not sure when she had her last dose.   She is medically clear for behavioral health admission. Her pneumonia will be treated like an outpatient and the patient should receive a total of 5 doses of levaqin with her first dose being in the ED 03/25/16. She will need repeat CXR to ensure resolution of her pneumonia after she completes antibiotic therapy.   This patient was discussed with Dr. Roderic Palau who agrees with assessment and plan.   Waynetta Pean, PA-C 03/25/16 2042  Milton Ferguson, MD 03/28/16 [phone]

## 2016-03-25 NOTE — ED Notes (Signed)
Patient has finish telepsych assessment; patient sitting up on stretcher

## 2016-03-25 NOTE — ED Notes (Signed)
Patient undressed, in gown, on monitor, continuous pulse oximetry and blood pressure cuff; warm blanket given 

## 2016-03-25 NOTE — ED Notes (Signed)
Patient has returned from being out of the department; lunch tray has arrived; patient is sitting up on stretcher eating her tray

## 2016-03-25 NOTE — ED Notes (Signed)
Charge nurse states that pt was found behind the dumpster by security pt brought back to room

## 2016-03-25 NOTE — Progress Notes (Signed)
Patient has been recommended psychiatric inpatient treatment by Elmarie Shiley NP, on 4/21.  Referrals have been sent to: [facility] - per [facility], adult bed open, adolescent and kid beds today. [facility] - per [facility], will review it for the waitlist. [facility] - per [facility], adolescent beds, adult beds today. [facility] [place] - per [facility], accepting referrals for review. [facility] per [facility], accepting referrals tonight but won't be able to accept anyone until Monday. [facility] per [facility], 2 beds, accepting referrals. [facility] - per [facility], accepting referrals tonight, female beds open.  [facility] - left voicemail.   At capacity: [facility]   CSW will continue to follow up with placement.  Audrey Meyer, [place] Disposition staff 03/25/2016 7:20 PM

## 2016-03-25 NOTE — ED Notes (Signed)
Up at desk phoning husband

## 2016-03-25 NOTE — ED Notes (Signed)
Patient still resting on stretcher; watching television; asking for a coke to drink

## 2016-03-25 NOTE — ED Notes (Signed)
Hospital sitter relieving me; report given

## 2016-03-25 NOTE — ED Notes (Signed)
Will, PA speaking with patient about her care plans

## 2016-03-25 NOTE — ED Notes (Signed)
Patient being transported to xray at this time 

## 2016-03-25 NOTE — ED Notes (Signed)
Placed telepsych machine at bedside

## 2016-03-25 NOTE — ED Notes (Signed)
TTS done 

## 2016-03-25 NOTE — ED Notes (Addendum)
Per ems PD was called to pts apt complex for pt  Having erratic behavior, ie walking around topless outside , and when [employer] approached she c/o cp left sided and wanted to be committed,   No N/V/D no radiation pt also reports 1 black stool yesterday so no asa was given by ems but pt was given 1 nitro which dropped her bp from 130/80 to 104/60.Audrey Meyer

## 2016-03-25 NOTE — ED Notes (Signed)
Diet tray ordered 

## 2016-03-26 NOTE — ED Notes (Signed)
Pt has not eaten her lunch. States "I'm not eating until I leave". Pt ate few bites of apple sauce w/Ibuprofen.

## 2016-03-26 NOTE — ED Notes (Addendum)
IVC papers - Original IVC paperwork placed in folder for Magistrate, copy to medical records. Per Romie Levee, RN report - copy faxed to [facility]. Located IVC paperwork that was served by [employer] - placed on clipboard.

## 2016-03-26 NOTE — ED Notes (Signed)
Pt more oriented and aware of situation at this time, finally sleeping.

## 2016-03-26 NOTE — ED Notes (Signed)
Per Sherri Rad, [place] - pt has been accepted - [facility]150[facility]1. Dr Evie Lacks accepting. Call report when pt en route - [phone]. Advised pt may be transported this evening if arrives prior to 11pm - if not, may be transported in am after 0600. Left message for [facility] - [phone] notifying of need for transport in am.

## 2016-03-26 NOTE — ED Notes (Signed)
[facility] declined d/t pneumonia.

## 2016-03-26 NOTE — BHH Counselor (Addendum)
TC to [facility] reports pt has been added to the wait list. TC to [facility] reports they don't have referral. She says they may have discharges Monday so referral can be sent. Writer faxed referral and Probation officer received confirmation that fax was sent successfully. TC to [facility] says they have no beds but they will hold on to referral and review on Monday. She says there are a large number of referrals to be reviewed at that time.  TC to [facility] says no beds are available. They will hold onto the referral. TC to [facility] doesn't have referral at the moment but referral can be faxed. Writer faxed referral again.  TC to [facility] reports they are full and won't have beds until Monday. She will keep the referral for review.  Arnold Long, [place] Therapeutic Triage Specialist,

## 2016-03-26 NOTE — ED Notes (Signed)
Hydralazine held d/t pt sleeping, c/o generalized weakness and BP/HR low earlier.

## 2016-03-26 NOTE — ED Notes (Signed)
Pharm Tech in w/pt and spouse - advised pt has not had Vicodin filled since 01/2016.

## 2016-03-26 NOTE — ED Notes (Signed)
Pt sitting on side of bed asking for her pain med. States she takes Vicodin 3 x/day for generalized pain. States gets filled at [facility]. Advised pt will verify.

## 2016-03-26 NOTE — ED Notes (Signed)
Spouse called and requested to speak w/pt. Advised pt is sleeping. He asked to advise pt when she awakens that he found the dog and will call back.

## 2016-03-26 NOTE — ED Notes (Signed)
Spouse visiting w/pt - cell [phone] and work/restaruant [phone]. Asked to be called w/updates. Pt voices approval.

## 2016-03-26 NOTE — BHH Counselor (Signed)
Ranell from [facility] called to see if patient still needed admission. This Probation officer informed her that patient still needs placement at this time. [facility] reports that she will review patients referral at this time.   Rosalin Hawking, LCSW Therapeutic Triage Specialist [place] 03/26/2016 5:10 PM

## 2016-03-26 NOTE — ED Notes (Signed)
Spouse leaving at this time.  

## 2016-03-27 DIAGNOSIS — F201 Disorganized schizophrenia: Secondary | ICD-10-CM | POA: Insufficient documentation

## 2016-03-27 NOTE — ED Notes (Signed)
[facility] deputy called and advised he verified pt has bed at [facility] is still to arrive around 1000 to arrive for transport.

## 2016-03-27 NOTE — ED Notes (Signed)
[facility] Deputy called and advised is planning on arriving at approx 1000 to transport pt to [facility].

## 2016-03-27 NOTE — ED Notes (Signed)
Spouse aware pt accepted to [facility] and will be transported this am by [facility]. Advised he is coming to visit pt at 0830.

## 2016-03-27 NOTE — ED Notes (Signed)
Spouse and another female family member visiting w/pt.

## 2016-04-25 ENCOUNTER — Encounter (HOSPITAL_COMMUNITY): Payer: Self-pay | Admitting: Emergency Medicine

## 2016-04-25 ENCOUNTER — Emergency Department (HOSPITAL_COMMUNITY)
Admission: EM | Admit: 2016-04-25 | Discharge: 2016-04-26 | Disposition: A | Discharge status: Home / Self Care | Payer: Medicare Other | Attending: Emergency Medicine | Admitting: Emergency Medicine

## 2016-04-25 DIAGNOSIS — F17210 Nicotine dependence, cigarettes, uncomplicated: Secondary | ICD-10-CM | POA: Insufficient documentation

## 2016-04-25 DIAGNOSIS — J449 Chronic obstructive pulmonary disease, unspecified: Secondary | ICD-10-CM | POA: Insufficient documentation

## 2016-04-25 DIAGNOSIS — I10 Essential (primary) hypertension: Secondary | ICD-10-CM | POA: Diagnosis not present

## 2016-04-25 DIAGNOSIS — F259 Schizoaffective disorder, unspecified: Secondary | ICD-10-CM | POA: Diagnosis not present

## 2016-04-25 DIAGNOSIS — F419 Anxiety disorder, unspecified: Secondary | ICD-10-CM

## 2016-04-25 DIAGNOSIS — Z79899 Other long term (current) drug therapy: Secondary | ICD-10-CM | POA: Diagnosis not present

## 2016-04-25 LAB — CBC WITH DIFFERENTIAL/PLATELET
BASOS ABS: 0 10*3/uL (ref 0.0–0.1)
Basophils Relative: 0 %
Eosinophils Absolute: 0.1 10*3/uL (ref 0.0–0.7)
Eosinophils Relative: 1 %
HEMATOCRIT: 36.8 % (ref 36.0–46.0)
Hemoglobin: 12.9 g/dL (ref 12.0–15.0)
LYMPHS PCT: 27 %
Lymphs Abs: 2.5 10*3/uL (ref 0.7–4.0)
MCH: 30.9 pg (ref 26.0–34.0)
MCHC: 35.1 g/dL (ref 30.0–36.0)
MCV: 88.2 fL (ref 78.0–100.0)
MONO ABS: 1.6 10*3/uL — AB (ref 0.1–1.0)
MONOS PCT: 17 %
NEUTROS ABS: 5.1 10*3/uL (ref 1.7–7.7)
Neutrophils Relative %: 55 %
Platelets: 286 10*3/uL (ref 150–400)
RBC: 4.17 MIL/uL (ref 3.87–5.11)
RDW: 13.8 % (ref 11.5–15.5)
WBC: 9.2 10*3/uL (ref 4.0–10.5)

## 2016-04-25 LAB — ETHANOL

## 2016-04-25 NOTE — ED Provider Notes (Signed)
CSN: [id number]     Arrival date & time 04/25/16  1647 History   First MD Initiated Contact with Patient 04/25/16 2240     Chief Complaint  Patient presents with  . Anxiety      Patient is a 60 y.o. female presenting with anxiety. The history is provided by the patient.  Anxiety This is a recurrent problem. Associated symptoms include shortness of breath. Pertinent negatives include no chest pain, no abdominal pain and no headaches.  Patient presented with anxiety. States she's been worse for last 3 days. Was recently at [facility] psychiatry for the same inpatient. States she will feel her heart racing states she has had some hallucinations. States she had her medicines changed that time. States she was doing better then began to be worse. No fevers or chills. No weight loss. No suicidal or homicidal thoughts. States she has been unable to manage it well the last few days. Patient denies substance abuse.  Past Medical History  Diagnosis Date  . Hypothyroid   . High cholesterol   . Acid reflux   . COPD (chronic obstructive pulmonary disease) ([place])   . Hypertension   . Schizoaffective disorder   . Breast cancer [facility])    Past Surgical History  Procedure Laterality Date  . Mastectomy, partial    . Appendectomy     Family History  Problem Relation Age of Onset  . Heart attack Father   . Cancer Other    Social History  Substance Use Topics  . Smoking status: Current Every Day Smoker -- 1.00 packs/day    Types: Cigarettes, Cigars  . Smokeless tobacco: None  . Alcohol Use: No   OB History    No data available     Review of Systems  Constitutional: Negative for activity change, appetite change and fatigue.  Eyes: Negative for pain.  Respiratory: Positive for shortness of breath. Negative for chest tightness.   Cardiovascular: Negative for chest pain and leg swelling.  Gastrointestinal: Negative for nausea, vomiting, abdominal pain and diarrhea.  Genitourinary: Negative for  flank pain.  Musculoskeletal: Negative for back pain and neck stiffness.  Skin: Negative for rash.  Neurological: Negative for weakness, numbness and headaches.  Psychiatric/Behavioral: Positive for hallucinations. Negative for behavioral problems.      Allergies  Budesonide  Home Medications   Prior to Admission medications   Medication Sig Start Date End Date Taking? Authorizing Provider  albuterol (PROVENTIL HFA;VENTOLIN HFA) 108 (90 BASE) MCG/ACT inhaler Inhale 2 puffs into the lungs every 6 (six) hours as needed. For shortness of breath   Yes Historical Provider, MD  atorvastatin (LIPITOR) 20 MG tablet Take 20 mg by mouth at bedtime. 04/14/16  Yes Historical Provider, MD  diazepam (VALIUM) 5 MG tablet Take 5 mg by mouth every 12 (twelve) hours as needed. For anxiety   Yes Historical Provider, MD  hydrALAZINE (APRESOLINE) 50 MG tablet Take 1.5 tablets (75 mg total) by mouth 3 (three) times daily. 03/14/16  Yes Barton Dubois, MD  levothyroxine (SYNTHROID, LEVOTHROID) 100 MCG tablet Take 100 mcg by mouth daily.   Yes Historical Provider, MD  liothyronine (CYTOMEL) 25 MCG tablet Take 12.5 mcg by mouth 2 (two) times daily.    Yes Historical Provider, MD  lisinopril (PRINIVIL,ZESTRIL) 20 MG tablet Take 20 mg by mouth 2 (two) times daily.    Yes Historical Provider, MD  PARoxetine (PAXIL) 40 MG tablet Take 40 mg by mouth every morning.   Yes Historical Provider, MD  potassium chloride  SA (K-DUR,KLOR-CON) 20 MEQ tablet Take 20 mEq by mouth 2 (two) times daily.   Yes Historical Provider, MD  spironolactone (ALDACTONE) 25 MG tablet Take 1 tablet (25 mg total) by mouth daily. 03/14/16  Yes Barton Dubois, MD  thiothixene (NAVANE) 2 MG capsule Take 6 mg by mouth at bedtime.    Yes Historical Provider, MD  atenolol (TENORMIN) 100 MG tablet Take 1 tablet (100 mg total) by mouth 2 (two) times daily. 03/14/16   Barton Dubois, MD  hydrOXYzine (VISTARIL) 25 MG capsule Take 25 mg by mouth 3 (three) times  daily as needed for anxiety.    Historical Provider, MD   BP 145/68 mmHg  Pulse 109  Temp(Src) 98.5 F (36.9 C) (Oral)  Resp 18  SpO2 93% Physical Exam  Constitutional: She appears well-developed.  HENT:  Head: Atraumatic.  Neck: Neck supple.  Pulmonary/Chest: Effort normal.  Musculoskeletal: Normal range of motion.  Skin: Skin is warm.  Psychiatric: She has a normal mood and affect.    ED Course  Procedures (including critical care time) Labs Review Labs Reviewed  COMPREHENSIVE METABOLIC PANEL  CBC WITH DIFFERENTIAL/PLATELET  URINE RAPID DRUG SCREEN, HOSP PERFORMED  ETHANOL    Imaging Review No results found. I have personally reviewed and evaluated these images and lab results as part of my medical decision-making.   EKG Interpretation None      MDM   Final diagnoses:  Anxiety  Schizoaffective disorder, unspecified type [facility])    Patient presents with anxiety and some hallucinations. States she has been paranoid also. Recent admission for same and no vomiting. Poorly chains her medications around. Worse last few days. We'll get lab work and EKG. To be seen by TTS after. Care turned over to Dr Claudine Mouton.    Davonna Belling, MD 04/25/16 [phone]

## 2016-04-25 NOTE — ED Notes (Signed)
Per EMS patient comes from home for anxiety. Patient recently had a bunch of her medications changed.  Patient called [facility] and they instructed her to call 911.  Patient denies SI/HI.

## 2016-04-26 ENCOUNTER — Emergency Department (HOSPITAL_COMMUNITY)
Admission: EM | Admit: 2016-04-26 | Discharge: 2016-04-27 | Disposition: A | Discharge status: Home / Self Care | Payer: Medicare Other | Source: Home / Self Care | Attending: Emergency Medicine | Admitting: Emergency Medicine

## 2016-04-26 ENCOUNTER — Encounter (HOSPITAL_COMMUNITY): Payer: Self-pay | Admitting: Emergency Medicine

## 2016-04-26 ENCOUNTER — Emergency Department (HOSPITAL_COMMUNITY)
Admission: EM | Admit: 2016-04-26 | Discharge: 2016-04-26 | Disposition: A | Discharge status: Home / Self Care | Payer: Medicare Other | Source: Home / Self Care | Attending: Emergency Medicine | Admitting: Emergency Medicine

## 2016-04-26 DIAGNOSIS — Z79899 Other long term (current) drug therapy: Secondary | ICD-10-CM | POA: Insufficient documentation

## 2016-04-26 DIAGNOSIS — F411 Generalized anxiety disorder: Secondary | ICD-10-CM | POA: Insufficient documentation

## 2016-04-26 DIAGNOSIS — Z8719 Personal history of other diseases of the digestive system: Secondary | ICD-10-CM

## 2016-04-26 DIAGNOSIS — Z853 Personal history of malignant neoplasm of breast: Secondary | ICD-10-CM

## 2016-04-26 DIAGNOSIS — I10 Essential (primary) hypertension: Secondary | ICD-10-CM

## 2016-04-26 DIAGNOSIS — F259 Schizoaffective disorder, unspecified: Secondary | ICD-10-CM | POA: Insufficient documentation

## 2016-04-26 DIAGNOSIS — E7800 Pure hypercholesterolemia, unspecified: Secondary | ICD-10-CM

## 2016-04-26 DIAGNOSIS — J449 Chronic obstructive pulmonary disease, unspecified: Secondary | ICD-10-CM

## 2016-04-26 DIAGNOSIS — F419 Anxiety disorder, unspecified: Secondary | ICD-10-CM | POA: Insufficient documentation

## 2016-04-26 DIAGNOSIS — F17210 Nicotine dependence, cigarettes, uncomplicated: Secondary | ICD-10-CM

## 2016-04-26 DIAGNOSIS — E039 Hypothyroidism, unspecified: Secondary | ICD-10-CM | POA: Insufficient documentation

## 2016-04-26 DIAGNOSIS — F410 Panic disorder [episodic paroxysmal anxiety] without agoraphobia: Secondary | ICD-10-CM

## 2016-04-26 DIAGNOSIS — Z9010 Acquired absence of unspecified breast and nipple: Secondary | ICD-10-CM | POA: Insufficient documentation

## 2016-04-26 LAB — COMPREHENSIVE METABOLIC PANEL
ALBUMIN: 4.6 g/dL (ref 3.5–5.0)
ALT: 20 U/L (ref 14–54)
ANION GAP: 8 (ref 5–15)
AST: 21 U/L (ref 15–41)
Alkaline Phosphatase: 61 U/L (ref 38–126)
BILIRUBIN TOTAL: 0.6 mg/dL (ref 0.3–1.2)
BUN: 11 mg/dL (ref 6–20)
CALCIUM: 9.6 mg/dL (ref 8.9–10.3)
CO2: 24 mmol/L (ref 22–32)
CREATININE: 0.67 mg/dL (ref 0.44–1.00)
Chloride: 103 mmol/L (ref 101–111)
GFR calc Af Amer: >60 mL/min (ref >60)
GFR calc non Af Amer: >60 mL/min (ref >60)
GLUCOSE: 110 mg/dL — AB (ref 65–99)
Potassium: 3.7 mmol/L (ref 3.5–5.1)
Sodium: 135 mmol/L (ref 135–145)
TOTAL PROTEIN: 7.8 g/dL (ref 6.5–8.1)

## 2016-04-26 LAB — RAPID URINE DRUG SCREEN, HOSP PERFORMED
Amphetamines: NOT DETECTED
BARBITURATES: NOT DETECTED
Benzodiazepines: POSITIVE — AB
Cocaine: NOT DETECTED
Opiates: NOT DETECTED
Tetrahydrocannabinol: NOT DETECTED

## 2016-04-26 MED ORDER — DIAZEPAM 5 MG PO TABS
5.0000 mg | ORAL_TABLET | Freq: Once | ORAL | Status: AC
  Administered 2016-04-26: 5 mg via ORAL
  Filled 2016-04-26: qty 1

## 2016-04-26 MED ORDER — DIAZEPAM 5 MG PO TABS: 5.0000 mg | ORAL_TABLET | Freq: Four times a day (QID) | ORAL | Status: DC | PRN

## 2016-04-26 MED ORDER — ACETAMINOPHEN 500 MG PO TABS
1000.0000 mg | ORAL_TABLET | Freq: Once | ORAL | Status: AC
  Administered 2016-04-26: 1000 mg via ORAL
  Filled 2016-04-26: qty 2

## 2016-04-26 NOTE — Discharge Instructions (Signed)

## 2016-04-26 NOTE — ED Provider Notes (Signed)
Patient assessed by TTS and does snot meet inpatient requirements.  She was given tylenol for headache while in my care.  Patient will be DC'ed with follow up.  Everlene Balls, MD 04/26/16 [phone]

## 2016-04-26 NOTE — BHH Counselor (Signed)
Assessment completed. Consulted Patriciaann Clan, PA-C who agrees that pt does not meet inpatient criteria. Informed Barbaraann Rondo, NP of the recommendation.

## 2016-04-26 NOTE — ED Provider Notes (Signed)
CSN: [id number]     Arrival date & time 04/26/16  2014 History   First MD Initiated Contact with Patient 04/26/16 2246     Chief Complaint  Patient presents with  . Anxiety     (Consider location/radiation/quality/duration/timing/severity/associated sxs/prior Treatment) HPI   Patient is a 60 year old female past medical history of reflux, hypertension, schizoaffective disorder and COPD who presents to the ED with complaint of panic attack. Patient endorses having a history of bad anxiety and patient reports that during her panic attacks she begins to shake and tremble. Patient reports her attacks typically lasts 30 minutes to an hour. She notes she has had more frequent panic attacks over the past few months. She reports she has been seen in the ED multiple times over the past few days but did not meet inpatient criteria. Patient states she was referred to [facility] and notes she went to their office yesterday but was unable to be seen by provider. Patient reports since arrival to the ED or panic attack has resolved. She notes she was given a prescription of Valium in the ED earlier today at her visit for panic attacks. She states she was given a Valium in the ED but was advised not to take her next dose until 11:30 PM. She notes she had a panic attack prior to that time at home which she was unable to manage resulting her coming to the ED. Patient denies any other pain or complaints at this time. Denies fever, headache, difficulty breathing, chest pain, abdominal pain, vomiting, diarrhea. Patient reports she was having increased anxiety related to a man she was stating but notes that she is getting back together with her husband.  Past Medical History  Diagnosis Date  . Hypothyroid   . High cholesterol   . Acid reflux   . COPD (chronic obstructive pulmonary disease) ([place])   . Hypertension   . Schizoaffective disorder   . Breast cancer [facility])    Past Surgical History  Procedure Laterality Date   . Mastectomy, partial    . Appendectomy     Family History  Problem Relation Age of Onset  . Heart attack Father   . Cancer Other    Social History  Substance Use Topics  . Smoking status: Current Every Day Smoker -- 0.00 packs/day    Types: Cigars  . Smokeless tobacco: None  . Alcohol Use: No   OB History    No data available     Review of Systems  Psychiatric/Behavioral: The patient is nervous/anxious.   All other systems reviewed and are negative.     Allergies  Budesonide  Home Medications   Prior to Admission medications   Medication Sig Start Date End Date Taking? Authorizing Provider  albuterol (PROVENTIL HFA;VENTOLIN HFA) 108 (90 BASE) MCG/ACT inhaler Inhale 2 puffs into the lungs every 6 (six) hours as needed. For shortness of breath    Historical Provider, MD  atenolol (TENORMIN) 100 MG tablet Take 1 tablet (100 mg total) by mouth 2 (two) times daily. Patient not taking: Reported on 04/26/2016 03/14/16   Barton Dubois, MD  atorvastatin (LIPITOR) 20 MG tablet Take 20 mg by mouth at bedtime. 04/14/16   Historical Provider, MD  diazepam (VALIUM) 5 MG tablet Take 5 mg by mouth every 12 (twelve) hours as needed. For anxiety    Historical Provider, MD  diazepam (VALIUM) 5 MG tablet Take 1 tablet (5 mg total) by mouth every 6 (six) hours as needed for anxiety (spasms). 04/26/16  Charlesetta Shanks, MD  hydrALAZINE (APRESOLINE) 50 MG tablet Take 1.5 tablets (75 mg total) by mouth 3 (three) times daily. 03/14/16   Barton Dubois, MD  hydrOXYzine (VISTARIL) 25 MG capsule Take 25 mg by mouth 3 (three) times daily as needed for anxiety.    Historical Provider, MD  levothyroxine (SYNTHROID, LEVOTHROID) 100 MCG tablet Take 100 mcg by mouth daily.    Historical Provider, MD  liothyronine (CYTOMEL) 25 MCG tablet Take 12.5 mcg by mouth 2 (two) times daily.     Historical Provider, MD  lisinopril (PRINIVIL,ZESTRIL) 20 MG tablet Take 20 mg by mouth 2 (two) times daily.     Historical  Provider, MD  PARoxetine (PAXIL) 40 MG tablet Take 40 mg by mouth every morning.    Historical Provider, MD  potassium chloride SA (K-DUR,KLOR-CON) 20 MEQ tablet Take 20 mEq by mouth 2 (two) times daily.    Historical Provider, MD  ramelteon (ROZEREM) 8 MG tablet Take 1 tablet (8 mg total) by mouth at bedtime. 04/27/16   Nona Dell, PA-C  spironolactone (ALDACTONE) 25 MG tablet Take 1 tablet (25 mg total) by mouth daily. 03/14/16   Barton Dubois, MD  thiothixene (NAVANE) 2 MG capsule Take 6 mg by mouth at bedtime.     Historical Provider, MD   BP 144/89 mmHg  Pulse 108  Temp(Src) 97.8 F (36.6 C) (Oral)  Resp 12  SpO2 94% Physical Exam  Constitutional: She is oriented to person, place, and time. She appears well-developed and well-nourished.  Pt appears mildly anxious  HENT:  Head: Normocephalic and atraumatic.  Mouth/Throat: Oropharynx is clear and moist. No oropharyngeal exudate.  Eyes: Conjunctivae and EOM are normal. Pupils are equal, round, and reactive to light. Right eye exhibits no discharge. Left eye exhibits no discharge. No scleral icterus.  Cardiovascular: Normal rate, regular rhythm, normal heart sounds and intact distal pulses.   Pulmonary/Chest: Effort normal and breath sounds normal. No respiratory distress. She has no wheezes. She has no rales. She exhibits no tenderness.  Abdominal: Soft. Bowel sounds are normal. There is no tenderness.  Musculoskeletal: Normal range of motion. She exhibits no edema.  Neurological: She is alert and oriented to person, place, and time.  Skin: Skin is warm and dry.  Nursing note and vitals reviewed.   ED Course  Procedures (including critical care time) Labs Review Labs Reviewed - No data to display  Imaging Review No results found. I have personally reviewed and evaluated these images and lab results as part of my medical decision-making.   EKG Interpretation None      MDM   Final diagnoses:  Anxiety     Patient presents with reported panic attack. She notes she has been seen in the ED multiple times over the past few days for similar episodes. She states she was diagnosed in the ED earlier today with a prescription of Valium but notes she did not take her medication at home prior to arrival. VSS. On exam patient appeared mildly anxious, remaining exam unremarkable. Chart review shows patient was seen in the ED yesterday and did not meet inpatient criteria. Patient was seen again earlier today, labs unremarkable, patient discharged home with prescription of Valium. Patient denies taking any medications with onset of her most recent panic attack prior to arrival. Patient reports her panic attack has improved since arrival to the ED but notes she is still feeling mildly anxious. Patient given 1 dose of PO Valium in the ED. On reevaluation, vitals stable,  HR 98, patient reports her anxiety has improved however she is requesting something to help with her insomnia. She notes she has not been sleeping well over the past week which she thinks is worsening her panic attacks. Plan to d/c pt home with rozerem. Discussed symptomatic tx for panic attacks including coping mechanisms. I also had a long discussion with pt regarding directions for her rx of Valium. Advised patient to continue taking her Valium as prescribed. Advised patient to follow up with [facility] at her scheduled appointment on Thursday. Patient reports understanding and agreement with plan. Discussed return precautions with patient.     Chesley Noon [place], [place] 04/27/16 0118  [name], MD 04/27/16 [id number]

## 2016-04-26 NOTE — ED Notes (Signed)
[facility] called for patient transport home.  

## 2016-04-26 NOTE — BH Assessment (Addendum)
Tele Assessment Note   Audrey Meyer is an 60 y.o. female presenting to [facility] reporting increasing anxiety. Pt stated "I have been having anxiety attacks". "Yesterday and the day before they lasted for a  day and today they have been continuous". Pt denies SI, HI and AVH at this time. Pt reported that she has attempted suicide in the past. Pt did not report any self-injurious behaviors at this time. PT reported that she was recently discharged from [facility]. Pt reported that she is receiving medication management through [facility] and reported that she contacted [facility] prior to coming to the ED. Pt did not report any drug or alcohol use at this time. Pt did not report any physical, sexual or emotional abuse at this time.  Pt does not meet inpatient criteria at this time. It is recommended that pt follow up with her outpatient provider.   Diagnosis: Generalized anxiety disorder, schizoaffective disorder   Past Medical History:  Past Medical History  Diagnosis Date  . Hypothyroid   . High cholesterol   . Acid reflux   . COPD (chronic obstructive pulmonary disease) ([place])   . Hypertension   . Schizoaffective disorder   . Breast cancer [facility])     Past Surgical History  Procedure Laterality Date  . Mastectomy, partial    . Appendectomy      Family History:  Family History  Problem Relation Age of Onset  . Heart attack Father   . Cancer Other     Social History:  reports that she has been smoking Cigarettes and Cigars.  She has been smoking about 1.00 pack per day. She does not have any smokeless tobacco history on file. She reports that she does not drink alcohol or use illicit drugs.  Additional Social History:     CIWA: CIWA-Ar BP: 145/68 mmHg Pulse Rate: 109 COWS:    PATIENT STRENGTHS: (choose at least two) Average or above average intelligence Communication skills  Allergies:  Allergies  Allergen Reactions  . Budesonide Shortness Of Breath    Home Medications:  (Not in  a hospital admission)  OB/GYN Status:  No LMP recorded. Patient is postmenopausal.  General Assessment Data Location of Assessment: [facility] ED TTS Assessment: In system Is this a Tele or Face-to-Face Assessment?: Face-to-Face Is this an Initial Assessment or a Re-assessment for this encounter?: Initial Assessment Marital status: Separated Living Arrangements: Alone Can pt return to current living arrangement?: Yes Admission Status: Voluntary Is patient capable of signing voluntary admission?: Yes Referral Source: Other Insurance type: Medicare     Crisis Care Plan Living Arrangements: Alone Name of Psychiatrist: Beverly Sessions Name of Therapist: None  Education Status Is patient currently in school?: No Highest grade of school patient has completed: unknown  Risk to self with the past 6 months Suicidal Ideation: No Has patient been a risk to self within the past 6 months prior to admission? : Yes Suicidal Intent: No Has patient had any suicidal intent within the past 6 months prior to admission? : Yes Is patient at risk for suicide?: No Suicidal Plan?: No Has patient had any suicidal plan within the past 6 months prior to admission? : Yes Access to Means: No Specify Access to Suicidal Means: Denies What has been your use of drugs/alcohol within the last 12 months?: Pt denies  Previous Attempts/Gestures: Yes How many times?: 1 Other Self Harm Risks: Pt denies  Triggers for Past Attempts: Unknown Intentional Self Injurious Behavior: None Family Suicide History: No Recent stressful life event(s):  Other (Comment) (Relationship stressors) Persecutory voices/beliefs?: No Depression: Yes Depression Symptoms: Insomnia, Fatigue, Feeling angry/irritable Substance abuse history and/or treatment for substance abuse?: No  Risk to Others within the past 6 months Homicidal Ideation: No Does patient have any lifetime risk of violence toward others beyond the six months prior to admission? :  No Thoughts of Harm to Others: No Current Homicidal Intent: No Current Homicidal Plan: No Access to Homicidal Means: No Identified Victim: N/A History of harm to others?: No Assessment of Violence: None Noted Violent Behavior Description: No violent behaviors observed.  Does patient have access to weapons?: No Criminal Charges Pending?: No Does patient have a court date: No Is patient on probation?: No  Psychosis Hallucinations: None noted Delusions: None noted  Mental Status Report Appearance/Hygiene: Unremarkable Eye Contact: Good Motor Activity: Freedom of movement Speech: Logical/coherent Level of Consciousness: Quiet/awake Mood: Pleasant Affect: Appropriate to circumstance Anxiety Level: Panic Attacks Panic attack frequency: Daily  Most recent panic attack: 04-25-16 Thought Processes: Relevant, Coherent Judgement: Unimpaired Orientation: Appropriate for developmental age Obsessive Compulsive Thoughts/Behaviors: None  Cognitive Functioning Concentration: Decreased Memory: Remote Intact, Recent Intact IQ: Average Insight: Good Impulse Control: Good Appetite: Poor Sleep: Decreased Total Hours of Sleep: 3 Vegetative Symptoms: None  ADLScreening [facility] [place] [facility] Assessment Services) Patient's cognitive ability adequate to safely complete daily activities?: Yes Patient able to express need for assistance with ADLs?: Yes Independently performs ADLs?: Yes (appropriate for developmental age)  Prior Inpatient Therapy Prior Inpatient Therapy: Yes Prior Therapy Dates: 201, 2017 Prior Therapy Facilty/Provider(s): [facility], [place]  Reason for Treatment: schizoaffective   Prior Outpatient Therapy Prior Outpatient Therapy: Yes Prior Therapy Dates: ongoing Prior Therapy Facilty/Provider(s): [facility] Reason for Treatment: Schizoaffective Disorder Does patient have an ACCT team?: No Does patient have Intensive In-House Services?  : No Does patient have [facility] services? : Yes Does  patient have P4CC services?: No  ADL Screening (condition at time of admission) Patient's cognitive ability adequate to safely complete daily activities?: Yes Is the patient deaf or have difficulty hearing?: No Does the patient have difficulty seeing, even when wearing glasses/contacts?: No Does the patient have difficulty concentrating, remembering, or making decisions?: No Patient able to express need for assistance with ADLs?: Yes Does the patient have difficulty dressing or bathing?: No Independently performs ADLs?: Yes (appropriate for developmental age)       Abuse/Neglect Assessment (Assessment to be complete while patient is alone) Physical Abuse: Denies Verbal Abuse: Denies Sexual Abuse: Denies Exploitation of patient/patient's resources: Denies Self-Neglect: Denies     Regulatory affairs officer (For Healthcare) Does patient have an advance directive?: No Would patient like information on creating an advanced directive?: No - patient declined information    Additional Information 1:1 In Past 12 Months?: No CIRT Risk: No Elopement Risk: Yes Does patient have medical clearance?: Yes     Disposition:  Disposition Disposition of Patient: Inpatient treatment program Type of inpatient treatment program: Adult  Seanpatrick Maisano S 04/26/2016 12:37 AM

## 2016-04-26 NOTE — ED Provider Notes (Signed)
CSN: [id number]     Arrival date & time 04/26/16  1110 History   First MD Initiated Contact with Patient 04/26/16 1304     Chief Complaint  Patient presents with  . Nausea     (Consider location/radiation/quality/duration/timing/severity/associated sxs/prior Treatment) HPI Patient and husband state that she has a very bad anxiety. It causes her to shake and tremble. She reports that she can't sleep at night at all. She reports the symptoms are much worse when she is outside. The patient had been evaluated at [facility] long and had reassessment by behavioral health. She did not meet inpatient criteria and was referred to over to [facility] to be seen. The patient and her husband reports that when she got over to [facility] she started shaking and became very anxious and then passed out in their lobby. She did not fall or injure herself. Her husband was with her and he assisted her to the ground. He reports she was trembling all over. At that time the were brought by EMS to [facility] for assessment. The patient reports that she doesn't have any medications at home that help her with these symptoms. Past Medical History  Diagnosis Date  . Hypothyroid   . High cholesterol   . Acid reflux   . COPD (chronic obstructive pulmonary disease) ([place])   . Hypertension   . Schizoaffective disorder   . Breast cancer [facility])    Past Surgical History  Procedure Laterality Date  . Mastectomy, partial    . Appendectomy     Family History  Problem Relation Age of Onset  . Heart attack Father   . Cancer Other    Social History  Substance Use Topics  . Smoking status: Current Every Day Smoker -- 0.00 packs/day    Types: Cigars  . Smokeless tobacco: None  . Alcohol Use: No   OB History    No data available     Review of Systems 10 Systems reviewed and are negative for acute change except as noted in the HPI.   Allergies  Budesonide  Home Medications   Prior to Admission medications   Medication  Sig Start Date End Date Taking? Authorizing Provider  albuterol (PROVENTIL HFA;VENTOLIN HFA) 108 (90 BASE) MCG/ACT inhaler Inhale 2 puffs into the lungs every 6 (six) hours as needed. For shortness of breath   Yes Historical Provider, MD  atorvastatin (LIPITOR) 20 MG tablet Take 20 mg by mouth at bedtime. 04/14/16  Yes Historical Provider, MD  diazepam (VALIUM) 5 MG tablet Take 5 mg by mouth every 12 (twelve) hours as needed. For anxiety   Yes Historical Provider, MD  hydrALAZINE (APRESOLINE) 50 MG tablet Take 1.5 tablets (75 mg total) by mouth 3 (three) times daily. 03/14/16  Yes Barton Dubois, MD  hydrOXYzine (VISTARIL) 25 MG capsule Take 25 mg by mouth 3 (three) times daily as needed for anxiety.   Yes Historical Provider, MD  levothyroxine (SYNTHROID, LEVOTHROID) 100 MCG tablet Take 100 mcg by mouth daily.   Yes Historical Provider, MD  liothyronine (CYTOMEL) 25 MCG tablet Take 12.5 mcg by mouth 2 (two) times daily.    Yes Historical Provider, MD  lisinopril (PRINIVIL,ZESTRIL) 20 MG tablet Take 20 mg by mouth 2 (two) times daily.    Yes Historical Provider, MD  PARoxetine (PAXIL) 40 MG tablet Take 40 mg by mouth every morning.   Yes Historical Provider, MD  potassium chloride SA (K-DUR,KLOR-CON) 20 MEQ tablet Take 20 mEq by mouth 2 (two) times daily.  Yes Historical Provider, MD  spironolactone (ALDACTONE) 25 MG tablet Take 1 tablet (25 mg total) by mouth daily. 03/14/16  Yes Barton Dubois, MD  thiothixene (NAVANE) 2 MG capsule Take 6 mg by mouth at bedtime.    Yes Historical Provider, MD  atenolol (TENORMIN) 100 MG tablet Take 1 tablet (100 mg total) by mouth 2 (two) times daily. Patient not taking: Reported on 04/26/2016 03/14/16   Barton Dubois, MD  diazepam (VALIUM) 5 MG tablet Take 1 tablet (5 mg total) by mouth every 6 (six) hours as needed for anxiety (spasms). 04/26/16   Charlesetta Shanks, MD   BP 164/95 mmHg  Pulse 97  Temp(Src) 98 F (36.7 C) (Oral)  Resp 20  Ht 5\' 2"  (1.575 m)  Wt 145  lb (65.772 kg)  BMI 26.51 kg/m2  SpO2 92% Physical Exam  Constitutional: She is oriented to person, place, and time. She appears well-developed and well-nourished.  HENT:  Head: Normocephalic and atraumatic.  Eyes: EOM are normal. Pupils are equal, round, and reactive to light.  Neck: Neck supple.  Cardiovascular: Normal rate, regular rhythm, normal heart sounds and intact distal pulses.   Pulmonary/Chest: Effort normal and breath sounds normal.  Abdominal: Soft. Bowel sounds are normal. She exhibits no distension. There is no tenderness.  Musculoskeletal: Normal range of motion. She exhibits no edema.  Neurological: She is alert and oriented to person, place, and time. She has normal strength. Coordination normal. GCS eye subscore is 4. GCS verbal subscore is 5. GCS motor subscore is 6.  Skin: Skin is warm, dry and intact.  Psychiatric: She has a normal mood and affect.    ED Course  Procedures (including critical care time) Labs Review Labs Reviewed - No data to display  Imaging Review No results found. I have personally reviewed and evaluated these images and lab results as part of my medical decision-making.   EKG Interpretation None      MDM   Final diagnoses:  Panic attack   The patient is clinically well in appearance. She has had evaluation and did not meet inpatient criteria. She is alert and appropriately interactive. She does not appear decompensated psychiatrically. She has an appointment scheduled on Thursday at 9:30 AM. Patient will be given Valium to take in the interim 2 days for her anxiety and insomnia.    Charlesetta Shanks, MD 04/26/16 [phone]

## 2016-04-26 NOTE — ED Notes (Signed)
Pt reports husband will be picking her up and has asked to go to the waiting room to wait. She ambulated to the waiting room without assistance. Given a breakfast tray prior to leaving [place] [facility]. Refused drink. Told patient we would arrange for transport and calling her apartment to get her in her apartment if necessary.

## 2016-04-26 NOTE — Discharge Instructions (Signed)
[name], call your doctor for help adjusting your medications.  You can also use the resources below for help. If any symptoms worsen, come back to the ED immediately. Thank you. Panic attacks are sudden, short feelings of great fear or discomfort. You may have them for no reason when you are relaxed, when you are uneasy (anxious), or when you are sleeping.  HOME CARE  Take all your medicines as told.  Check with your doctor before starting new medicines.  Keep all doctor visits. GET HELP IF:  You are not able to take your medicines as told.  Your symptoms do not get better.  Your symptoms get worse. GET HELP RIGHT AWAY IF:  Your attacks seem different than your normal attacks.  You have thoughts about hurting yourself or others.  You take panic attack medicine and you have a side effect. MAKE SURE YOU:  Understand these instructions.  Will watch your condition.  Will get help right away if you are not doing well or get worse.   This information is not intended to replace advice given to you by your health care provider. Make sure you discuss any questions you have with your health care provider.   Document Released: 12/24/2010 Document Revised: 09/11/2013 Document Reviewed: 07/05/2013 Elsevier Interactive Patient Education 2016 [employer]. Substance Abuse Treatment Programs  Intensive Outpatient Programs [facility] Services     [address], [place]       [facility] [place] [address] [place], [place]  [facility]     (Inpatient and outpatient)     [address]           [place] [phone] (Suboxone and Methadone)  [place], [place] [address]      [place] Suite [phone] [place], [place]  [facility] [place] (Outpatient/Inpatient,  Chemical)    (insurance only) [phone]             [facility]aring Services ([place]) [place], [place]     [facility]     [address]     [place], [place]       [facility] (for caregivers and family) [phone] [address] Kristeen Mans. [place], [place]      Residential Treatment Programs [facility]      [address] [place], [place] [address]  [phone]       [facility] [address], [place], [place] [address] [phone]  [facility] [place]        [phone]       [facility] [phone]  [facility] (Freedom.)             [place], [place]  [phone] or [place] of [place], [address] [phone]  [facility] [place]    [address]      [place], [place]       [facility] [place], [place]  [place]   [address] [place], [place] [address]     [phone]      Admissions: 8am-3pm M-F  Residential Treatment Services (RTS) [address] [place], [place]  BATS Program: Residential Program [phone] Days)   [place], [place] or [phone]     ADATC: [facility] [place], [place] (Walk in Hours over the weekend or by referral)  [facility] [place], [place], [place] [address] [phone]  Crisis Mobile: [facility]:  [phone] (for crisis response 24 hours a day) [facility] Hotline:      [phone] Outpatient Psychiatry and Counseling  [facility]: Mobile Crisis Management 24 hours:  [phone]  [facility] of the [employer] sliding scale fee and walk in  schedule: M-F 8am-12pm/1pm-3pm [place], [place] [address] [address] [place], [place] [address] [phone]  [facility] (Formerly known as [facility] [employer])- new patient walk-in appointments available Monday - Friday 8am -3pm.          [address] [place], [place] [address] [phone] or crisis line- [place] Services/ Intensive Outpatient Therapy Program [place], [place] [address] [place]      [phone] [address] [place], [place] [address]                 [place]   [facility] [phone]. [place], [place] [address]   [employer] of Care          [address] Johnette Abraham  [place], [place] [address]       [phone]  [facility] [address] [place], [place] [address] [phone]  [facility]    [address] [place], [place] [address]     [place], [place] Joycelyn Man     [place] [address]     [phone]       [facility] [place] [address]  [facility]     [address] [place], [place], [place] [address] [place], [place] [address] [place]     [address]     [place], [place] [address]     [phone]       [facility] [address] [place],  [address] [phone] Resources for Temporary  Residential Assistance/Crisis [facility]) M-F 8am-3pm   [address], [place] [address]   [phone] Services include: laundry,  barbering, support groups, case management, phone  & computer access, showers, AA/NA mtgs, mental health/substance abuse nurse, job skills class, disability information, VA assistance, spiritual classes, etc.   HOMELESS [place] [facility]   [address], [place]     [place]              [employer] (women and children)       [address]. [place], [place] [address] [phone] [email or web address] for application and process Application Required  [facility]   [address]    [place] [address]     [phone]                    [employer] [place], [place] [address] [id number] application appt.) Application Required  [facility] (women only)    [address]     [place], [place] [address]     [phone]      Intake starts 6pm daily Need valid ID, SSC, & Police report [employer] [address] [place], [place] [id number] Application Required  [employer] (men only)     [place].      [place], [place]       [place] (Pregnant women only) [address] [place], [place]  [facility]      [place]i [address].      [place], [place] [address]     [phone]             [facility] [address] [place], [place] 90 day commitment/SA/Application process  [facility](men only)     [address]     [place], [place]       Check-in at [facility] of [facility] [employer] [address] [place], [place] [address] [phone] Men/Women/Women and Children must be there by 7 pm  [place], [place]

## 2016-04-26 NOTE — ED Notes (Signed)
Patient states she may not have a ride home.  Will attempt to call her husband.  If no one is able to pick patient up, [facility] will be called.

## 2016-04-26 NOTE — ED Notes (Signed)
Patient states she has been having nausea with dry heaves since Saturday.   Patient states has also been having panic attacks.   Patient denies abdominal pain, and other symptoms. Patient was seen at [facility] yesterday and told EMS that she wanted a second opinion.   Patient states she needs anxiety medications.

## 2016-04-26 NOTE — ED Notes (Addendum)
Pt was seen this morning for anxiety at [facility]. Pt was given a rx for valium. According to rx, pt should not take next dose until 2130. Pt states that her anxiety is unmanageable until then. Pt states she has an appointment at [facility] on [facility], but was unable to wait until then. Pt was asked what coping skills she has to manage her anxiety, she states that she has none. Pt states that her legs are hurting from her shaking them.

## 2016-04-27 MED ORDER — RAMELTEON 8 MG PO TABS: 8.0000 mg | ORAL_TABLET | Freq: Every day | ORAL | Status: DC

## 2016-04-27 MED ORDER — DIAZEPAM 5 MG PO TABS
5.0000 mg | ORAL_TABLET | Freq: Once | ORAL | Status: AC
  Administered 2016-04-27: 5 mg via ORAL
  Filled 2016-04-27: qty 1

## 2016-04-27 NOTE — ED Notes (Signed)
Patient states she is having an anxiety attack. Pt is sitting on the stretcher, shaking her legs and arms. Pt's respirations are even, regular, and unlabored. Informed Elmyra Ricks, PA of patient's condition.

## 2016-04-27 NOTE — Discharge Instructions (Signed)
Continue taking your prescription of 5 mg Valium every 4-6 hours as needed for anxiety or panic attacks. Do not take more than 4 tablets of Valium daily. [facility] your scheduled appointment on Thursday at 9:30 AM for further management of your anxiety and panic attacks. Return to the emergency department if symptoms worsen or new onset of fever, headache, chest pain, difficulty breathing, numbness, tingling, weakness, syncope, seizure.

## 2016-04-28 ENCOUNTER — Encounter (HOSPITAL_COMMUNITY): Payer: Self-pay | Admitting: *Deleted

## 2016-04-28 ENCOUNTER — Emergency Department (HOSPITAL_COMMUNITY): Payer: Medicare Other

## 2016-04-28 ENCOUNTER — Emergency Department (HOSPITAL_COMMUNITY)
Admission: EM | Admit: 2016-04-28 | Discharge: 2016-04-28 | Disposition: A | Discharge status: Home / Self Care | Payer: Medicare Other | Attending: Emergency Medicine | Admitting: Emergency Medicine

## 2016-04-28 ENCOUNTER — Inpatient Hospital Stay (HOSPITAL_COMMUNITY)
Admission: AD | Admit: 2016-04-28 | Discharge: 2016-05-05 | DRG: 885 | Disposition: A | Discharge status: Home / Self Care | Payer: Medicare Other | Source: Intra-hospital | Attending: Psychiatry | Admitting: Psychiatry

## 2016-04-28 ENCOUNTER — Encounter (HOSPITAL_COMMUNITY): Payer: Self-pay

## 2016-04-28 DIAGNOSIS — Z820 Family history of epilepsy and other diseases of the nervous system: Secondary | ICD-10-CM

## 2016-04-28 DIAGNOSIS — F332 Major depressive disorder, recurrent severe without psychotic features: Secondary | ICD-10-CM | POA: Diagnosis not present

## 2016-04-28 DIAGNOSIS — F209 Schizophrenia, unspecified: Secondary | ICD-10-CM | POA: Insufficient documentation

## 2016-04-28 DIAGNOSIS — Z79899 Other long term (current) drug therapy: Secondary | ICD-10-CM | POA: Insufficient documentation

## 2016-04-28 DIAGNOSIS — I10 Essential (primary) hypertension: Secondary | ICD-10-CM | POA: Insufficient documentation

## 2016-04-28 DIAGNOSIS — E039 Hypothyroidism, unspecified: Secondary | ICD-10-CM | POA: Diagnosis present

## 2016-04-28 DIAGNOSIS — J449 Chronic obstructive pulmonary disease, unspecified: Secondary | ICD-10-CM | POA: Diagnosis present

## 2016-04-28 DIAGNOSIS — E871 Hypo-osmolality and hyponatremia: Secondary | ICD-10-CM | POA: Diagnosis not present

## 2016-04-28 DIAGNOSIS — F331 Major depressive disorder, recurrent, moderate: Secondary | ICD-10-CM | POA: Diagnosis present

## 2016-04-28 DIAGNOSIS — Z853 Personal history of malignant neoplasm of breast: Secondary | ICD-10-CM | POA: Insufficient documentation

## 2016-04-28 DIAGNOSIS — K219 Gastro-esophageal reflux disease without esophagitis: Secondary | ICD-10-CM | POA: Diagnosis present

## 2016-04-28 DIAGNOSIS — E0590 Thyrotoxicosis, unspecified without thyrotoxic crisis or storm: Secondary | ICD-10-CM | POA: Diagnosis present

## 2016-04-28 DIAGNOSIS — F17210 Nicotine dependence, cigarettes, uncomplicated: Secondary | ICD-10-CM | POA: Diagnosis present

## 2016-04-28 DIAGNOSIS — F410 Panic disorder [episodic paroxysmal anxiety] without agoraphobia: Secondary | ICD-10-CM | POA: Diagnosis present

## 2016-04-28 DIAGNOSIS — F419 Anxiety disorder, unspecified: Secondary | ICD-10-CM | POA: Insufficient documentation

## 2016-04-28 DIAGNOSIS — F259 Schizoaffective disorder, unspecified: Secondary | ICD-10-CM | POA: Diagnosis present

## 2016-04-28 DIAGNOSIS — F4000 Agoraphobia, unspecified: Secondary | ICD-10-CM | POA: Diagnosis present

## 2016-04-28 DIAGNOSIS — E7800 Pure hypercholesterolemia, unspecified: Secondary | ICD-10-CM | POA: Diagnosis present

## 2016-04-28 DIAGNOSIS — Z8249 Family history of ischemic heart disease and other diseases of the circulatory system: Secondary | ICD-10-CM | POA: Diagnosis not present

## 2016-04-28 DIAGNOSIS — F411 Generalized anxiety disorder: Secondary | ICD-10-CM | POA: Diagnosis present

## 2016-04-28 LAB — CBC WITH DIFFERENTIAL/PLATELET
BASOS ABS: 0 10*3/uL (ref 0.0–0.1)
Basophils Relative: 0 %
Eosinophils Absolute: 0.1 10*3/uL (ref 0.0–0.7)
Eosinophils Relative: 1 %
HEMATOCRIT: 38.3 % (ref 36.0–46.0)
Hemoglobin: 12.9 g/dL (ref 12.0–15.0)
LYMPHS ABS: 1.3 10*3/uL (ref 0.7–4.0)
LYMPHS PCT: 16 %
MCH: 30.9 pg (ref 26.0–34.0)
MCHC: 33.7 g/dL (ref 30.0–36.0)
MCV: 91.6 fL (ref 78.0–100.0)
MONO ABS: 1.1 10*3/uL — AB (ref 0.1–1.0)
Monocytes Relative: 14 %
NEUTROS ABS: 5.9 10*3/uL (ref 1.7–7.7)
Neutrophils Relative %: 69 %
Platelets: 264 10*3/uL (ref 150–400)
RBC: 4.18 MIL/uL (ref 3.87–5.11)
RDW: 14.1 % (ref 11.5–15.5)
WBC: 8.4 10*3/uL (ref 4.0–10.5)

## 2016-04-28 LAB — COMPREHENSIVE METABOLIC PANEL
ALBUMIN: 4.5 g/dL (ref 3.5–5.0)
ALT: 21 U/L (ref 14–54)
AST: 25 U/L (ref 15–41)
Alkaline Phosphatase: 55 U/L (ref 38–126)
Anion gap: 8 (ref 5–15)
BUN: 18 mg/dL (ref 6–20)
CO2: 23 mmol/L (ref 22–32)
Calcium: 9.7 mg/dL (ref 8.9–10.3)
Chloride: 105 mmol/L (ref 101–111)
Creatinine, Ser: 0.59 mg/dL (ref 0.44–1.00)
GFR calc Af Amer: >60 mL/min (ref >60)
Glucose, Bld: 122 mg/dL — ABNORMAL HIGH (ref 65–99)
Potassium: 4.5 mmol/L (ref 3.5–5.1)
Sodium: 136 mmol/L (ref 135–145)
Total Bilirubin: 0.8 mg/dL (ref 0.3–1.2)
Total Protein: 7.5 g/dL (ref 6.5–8.1)

## 2016-04-28 LAB — RAPID URINE DRUG SCREEN, HOSP PERFORMED
AMPHETAMINES: NOT DETECTED
BARBITURATES: NOT DETECTED
BENZODIAZEPINES: POSITIVE — AB
Cocaine: NOT DETECTED
Opiates: NOT DETECTED
Tetrahydrocannabinol: NOT DETECTED

## 2016-04-28 LAB — ETHANOL

## 2016-04-28 LAB — TROPONIN I

## 2016-04-28 MED ORDER — SPIRONOLACTONE 25 MG PO TABS
25.0000 mg | ORAL_TABLET | Freq: Every day | ORAL | Status: DC
  Administered 2016-04-28 – 2016-05-05 (×8): 25 mg via ORAL
  Filled 2016-04-28 (×11): qty 1

## 2016-04-28 MED ORDER — POTASSIUM CHLORIDE CRYS ER 10 MEQ PO TBCR
20.0000 meq | EXTENDED_RELEASE_TABLET | Freq: Two times a day (BID) | ORAL | Status: DC
  Administered 2016-04-28 – 2016-04-29 (×3): 20 meq via ORAL
  Administered 2016-04-30: 10 meq via ORAL
  Administered 2016-04-30 – 2016-05-05 (×10): 20 meq via ORAL
  Filled 2016-04-28: qty 1
  Filled 2016-04-28 (×7): qty 2
  Filled 2016-04-28 (×2): qty 1
  Filled 2016-04-28 (×6): qty 2
  Filled 2016-04-28: qty 1
  Filled 2016-04-28 (×2): qty 2
  Filled 2016-04-28: qty 1
  Filled 2016-04-28: qty 2

## 2016-04-28 MED ORDER — IBUPROFEN 200 MG PO TABS: 600.0000 mg | ORAL_TABLET | Freq: Three times a day (TID) | ORAL | Status: DC | PRN

## 2016-04-28 MED ORDER — HYDROXYZINE PAMOATE 25 MG PO CAPS
25.0000 mg | ORAL_CAPSULE | Freq: Three times a day (TID) | ORAL | Status: DC | PRN
  Filled 2016-04-28: qty 1

## 2016-04-28 MED ORDER — ONDANSETRON HCL 4 MG PO TABS: 4.0000 mg | ORAL_TABLET | Freq: Three times a day (TID) | ORAL | Status: DC | PRN

## 2016-04-28 MED ORDER — SPIRONOLACTONE 25 MG PO TABS
25.0000 mg | ORAL_TABLET | Freq: Every day | ORAL | Status: DC
  Filled 2016-04-28 (×2): qty 1

## 2016-04-28 MED ORDER — LEVOTHYROXINE SODIUM 100 MCG PO TABS
100.0000 ug | ORAL_TABLET | Freq: Every day | ORAL | Status: DC
  Administered 2016-04-28: 100 ug via ORAL
  Filled 2016-04-28 (×2): qty 1

## 2016-04-28 MED ORDER — TRAZODONE HCL 50 MG PO TABS: 50.0000 mg | ORAL_TABLET | Freq: Every evening | ORAL | Status: DC | PRN

## 2016-04-28 MED ORDER — LORAZEPAM 1 MG PO TABS: 1.0000 mg | ORAL_TABLET | Freq: Three times a day (TID) | ORAL | Status: DC | PRN

## 2016-04-28 MED ORDER — DIAZEPAM 5 MG PO TABS
5.0000 mg | ORAL_TABLET | Freq: Four times a day (QID) | ORAL | Status: DC | PRN
  Administered 2016-04-28 – 2016-04-29 (×3): 5 mg via ORAL
  Filled 2016-04-28 (×3): qty 1

## 2016-04-28 MED ORDER — PAROXETINE HCL 20 MG PO TABS: 40.0000 mg | ORAL_TABLET | ORAL | Status: DC

## 2016-04-28 MED ORDER — LISINOPRIL 20 MG PO TABS
20.0000 mg | ORAL_TABLET | Freq: Two times a day (BID) | ORAL | Status: DC
  Administered 2016-04-28: 20 mg via ORAL
  Filled 2016-04-28 (×2): qty 1

## 2016-04-28 MED ORDER — POTASSIUM CHLORIDE CRYS ER 20 MEQ PO TBCR
20.0000 meq | EXTENDED_RELEASE_TABLET | Freq: Two times a day (BID) | ORAL | Status: DC
  Filled 2016-04-28 (×2): qty 1

## 2016-04-28 MED ORDER — DIAZEPAM 5 MG PO TABS: 5.0000 mg | ORAL_TABLET | Freq: Four times a day (QID) | ORAL | Status: DC | PRN

## 2016-04-28 MED ORDER — HYDRALAZINE HCL 50 MG PO TABS
75.0000 mg | ORAL_TABLET | Freq: Three times a day (TID) | ORAL | Status: DC
  Administered 2016-04-29: 75 mg via ORAL
  Filled 2016-04-28 (×4): qty 1

## 2016-04-28 MED ORDER — RAMELTEON 8 MG PO TABS
8.0000 mg | ORAL_TABLET | Freq: Every day | ORAL | Status: DC
Start: 2016-04-28 — End: 2016-04-28

## 2016-04-28 MED ORDER — RAMELTEON 8 MG PO TABS
8.0000 mg | ORAL_TABLET | Freq: Every day | ORAL | Status: DC
  Administered 2016-04-28 – 2016-05-01 (×4): 8 mg via ORAL
  Filled 2016-04-28 (×8): qty 1

## 2016-04-28 MED ORDER — HYDROXYZINE HCL 25 MG PO TABS: 25.0000 mg | ORAL_TABLET | Freq: Three times a day (TID) | ORAL | Status: DC | PRN

## 2016-04-28 MED ORDER — ACETAMINOPHEN 325 MG PO TABS
650.0000 mg | ORAL_TABLET | Freq: Four times a day (QID) | ORAL | Status: DC | PRN
  Administered 2016-05-01 – 2016-05-03 (×3): 650 mg via ORAL
  Filled 2016-04-28 (×3): qty 2

## 2016-04-28 MED ORDER — ONDANSETRON 4 MG PO TBDP
4.0000 mg | ORAL_TABLET | Freq: Four times a day (QID) | ORAL | Status: DC | PRN
  Administered 2016-04-28 – 2016-05-02 (×7): 4 mg via ORAL
  Filled 2016-04-28 (×7): qty 1

## 2016-04-28 MED ORDER — ALBUTEROL SULFATE HFA 108 (90 BASE) MCG/ACT IN AERS: 2.0000 | INHALATION_SPRAY | Freq: Four times a day (QID) | RESPIRATORY_TRACT | Status: DC | PRN

## 2016-04-28 MED ORDER — ONDANSETRON 8 MG PO TBDP
8.0000 mg | ORAL_TABLET | Freq: Once | ORAL | Status: AC
  Administered 2016-04-28: 8 mg via ORAL
  Filled 2016-04-28: qty 1

## 2016-04-28 MED ORDER — POTASSIUM CHLORIDE CRYS ER 20 MEQ PO TBCR
20.0000 meq | EXTENDED_RELEASE_TABLET | Freq: Two times a day (BID) | ORAL | Status: DC
  Administered 2016-04-28: 20 meq via ORAL
  Filled 2016-04-28: qty 1

## 2016-04-28 MED ORDER — LIOTHYRONINE SODIUM 25 MCG PO TABS
12.5000 ug | ORAL_TABLET | Freq: Two times a day (BID) | ORAL | Status: DC
  Administered 2016-04-28: 12.5 ug via ORAL
  Filled 2016-04-28 (×2): qty 1

## 2016-04-28 MED ORDER — THIOTHIXENE 5 MG PO CAPS: 6.0000 mg | ORAL_CAPSULE | Freq: Every day | ORAL | Status: DC

## 2016-04-28 MED ORDER — ATORVASTATIN CALCIUM 20 MG PO TABS
20.0000 mg | ORAL_TABLET | Freq: Every day | ORAL | Status: DC
  Administered 2016-04-28 – 2016-05-04 (×7): 20 mg via ORAL
  Filled 2016-04-28 (×5): qty 1
  Filled 2016-04-28: qty 2
  Filled 2016-04-28 (×4): qty 1

## 2016-04-28 MED ORDER — MAGNESIUM HYDROXIDE 400 MG/5ML PO SUSP: 30.0000 mL | Freq: Every day | ORAL | Status: DC | PRN

## 2016-04-28 MED ORDER — ATORVASTATIN CALCIUM 20 MG PO TABS: 20.0000 mg | ORAL_TABLET | Freq: Every day | ORAL | Status: DC

## 2016-04-28 MED ORDER — LIOTHYRONINE SODIUM 25 MCG PO TABS
12.5000 ug | ORAL_TABLET | Freq: Two times a day (BID) | ORAL | Status: DC
  Administered 2016-04-29 – 2016-04-30 (×3): 12.5 ug via ORAL
  Filled 2016-04-28 (×7): qty 1

## 2016-04-28 MED ORDER — LEVOTHYROXINE SODIUM 100 MCG PO TABS
100.0000 ug | ORAL_TABLET | Freq: Every day | ORAL | Status: DC
  Administered 2016-04-29 – 2016-05-03 (×5): 100 ug via ORAL
  Filled 2016-04-28 (×7): qty 1

## 2016-04-28 MED ORDER — SPIRONOLACTONE 25 MG PO TABS
25.0000 mg | ORAL_TABLET | Freq: Every day | ORAL | Status: DC
  Administered 2016-04-28: 25 mg via ORAL
  Filled 2016-04-28: qty 1

## 2016-04-28 MED ORDER — ALUM & MAG HYDROXIDE-SIMETH 200-200-20 MG/5ML PO SUSP: 30.0000 mL | ORAL | Status: DC | PRN

## 2016-04-28 MED ORDER — HYDRALAZINE HCL 50 MG PO TABS
75.0000 mg | ORAL_TABLET | Freq: Three times a day (TID) | ORAL | Status: DC
  Administered 2016-04-28: 75 mg via ORAL
  Filled 2016-04-28 (×2): qty 1

## 2016-04-28 MED ORDER — ACETAMINOPHEN 325 MG PO TABS: 650.0000 mg | ORAL_TABLET | ORAL | Status: DC | PRN

## 2016-04-28 NOTE — Progress Notes (Signed)
Pt did not attend karaoke group, declined invitation.

## 2016-04-28 NOTE — ED Notes (Signed)
Per EMS, Pt was at [facility] trying to check herself in for an unknown complaint and was sent to ED, due to being "cool and clammy."  Denies medical complaints w/ EMS.  CBG 114.  Pt reports being noncomplaint w/ medications x 3 days.  Pt was seen at [facility] x 2 days ago for anxiety.  Hx of schizoaffective disorder.

## 2016-04-28 NOTE — ED Provider Notes (Signed)
CSN: [id number]     Arrival date & time 04/28/16  1047 History   First MD Initiated Contact with Patient 04/28/16 1055     Chief Complaint  Patient presents with  . Medical Clearance  . Anxiety   PT WAS INITIALLY AT [facility] HERSELF IN DUE TO SEVERE ANXIETY.  BH THOUGHT PT LOOKED COOL AND CLAMMY, SO THEY CALLED EMS TO BRING HER HERE. THE PT HAS BEEN TO THE ED MULTIPLE TIMES FOR ANXIETY.  SHE SAID THAT SHE CAN'T SLEEP, SHE CAN'T EAT.  SHE IS UNABLE TO FUNCTION AND FEELS LIKE SHE CAN'T STAY BY HERSELF AT HOME.  THE PT DID TAKE HER VALIUM THIS AM AROUND 0530.  (Consider location/radiation/quality/duration/timing/severity/associated sxs/prior Treatment) Patient is a 60 y.o. female presenting with anxiety. The history is provided by the patient.  Anxiety This is a recurrent problem. The current episode started more than 1 week ago. The problem occurs constantly. The problem has not changed since onset.Nothing aggravates the symptoms. Nothing relieves the symptoms.    Past Medical History  Diagnosis Date  . Hypothyroid   . High cholesterol   . Acid reflux   . COPD (chronic obstructive pulmonary disease) ([place])   . Hypertension   . Schizoaffective disorder   . Breast cancer [facility])    Past Surgical History  Procedure Laterality Date  . Mastectomy, partial    . Appendectomy     Family History  Problem Relation Age of Onset  . Heart attack Father   . Cancer Other    Social History  Substance Use Topics  . Smoking status: Current Every Day Smoker -- 0.00 packs/day    Types: Cigars  . Smokeless tobacco: None  . Alcohol Use: No   OB History    No data available     Review of Systems  Psychiatric/Behavioral: The patient is nervous/anxious.   All other systems reviewed and are negative.     Allergies  Budesonide  Home Medications   Prior to Admission medications   Medication Sig Start Date End Date Taking? Authorizing Provider  albuterol (PROVENTIL HFA;VENTOLIN HFA) 108  (90 BASE) MCG/ACT inhaler Inhale 2 puffs into the lungs every 6 (six) hours as needed. For shortness of breath   Yes Historical Provider, MD  atorvastatin (LIPITOR) 20 MG tablet Take 20 mg by mouth at bedtime. 04/14/16  Yes Historical Provider, MD  diazepam (VALIUM) 5 MG tablet Take 1 tablet (5 mg total) by mouth every 6 (six) hours as needed for anxiety (spasms). 04/26/16  Yes Charlesetta Shanks, MD  hydrALAZINE (APRESOLINE) 50 MG tablet Take 1.5 tablets (75 mg total) by mouth 3 (three) times daily. 03/14/16  Yes Barton Dubois, MD  hydrOXYzine (VISTARIL) 25 MG capsule Take 25 mg by mouth 3 (three) times daily as needed for anxiety.   Yes Historical Provider, MD  levothyroxine (SYNTHROID, LEVOTHROID) 100 MCG tablet Take 100 mcg by mouth daily.   Yes Historical Provider, MD  liothyronine (CYTOMEL) 25 MCG tablet Take 12.5 mcg by mouth 2 (two) times daily.    Yes Historical Provider, MD  lisinopril (PRINIVIL,ZESTRIL) 20 MG tablet Take 20 mg by mouth 2 (two) times daily.    Yes Historical Provider, MD  PARoxetine (PAXIL) 40 MG tablet Take 40 mg by mouth every morning.   Yes Historical Provider, MD  potassium chloride SA (K-DUR,KLOR-CON) 20 MEQ tablet Take 20 mEq by mouth 2 (two) times daily.   Yes Historical Provider, MD  ramelteon (ROZEREM) 8 MG tablet Take 1 tablet (  8 mg total) by mouth at bedtime. 04/27/16  Yes Nona Dell, PA-C  spironolactone (ALDACTONE) 25 MG tablet Take 1 tablet (25 mg total) by mouth daily. 03/14/16  Yes Barton Dubois, MD  thiothixene (NAVANE) 2 MG capsule Take 6 mg by mouth at bedtime.    Yes Historical Provider, MD  atenolol (TENORMIN) 100 MG tablet Take 1 tablet (100 mg total) by mouth 2 (two) times daily. Patient not taking: Reported on 04/26/2016 03/14/16   Barton Dubois, MD   BP 145/73 mmHg  Pulse 92  Temp(Src) 98.3 F (36.8 C) (Oral)  Resp 15  SpO2 94% Physical Exam  Constitutional: She is oriented to person, place, and time. She appears well-developed and  well-nourished.  HENT:  Head: Normocephalic and atraumatic.  Right Ear: External ear normal.  Left Ear: External ear normal.  Nose: Nose normal.  Mouth/Throat: Oropharynx is clear and moist.  Eyes: Conjunctivae and EOM are normal. Pupils are equal, round, and reactive to light.  Neck: Normal range of motion. Neck supple.  Cardiovascular: Normal rate, regular rhythm, normal heart sounds and intact distal pulses.   Pulmonary/Chest: Effort normal and breath sounds normal.  Abdominal: Soft. Bowel sounds are normal.  Musculoskeletal: Normal range of motion.  Neurological: She is alert and oriented to person, place, and time.  Skin: Skin is warm and dry.  Psychiatric: Her speech is normal and behavior is normal. Judgment and thought content normal. Her mood appears anxious. Cognition and memory are normal.  Nursing note and vitals reviewed.   ED Course  Procedures (including critical care time) Labs Review Labs Reviewed  COMPREHENSIVE METABOLIC PANEL - Abnormal; Notable for the following:    Glucose, Bld 122 (*)    All other components within normal limits  CBC WITH DIFFERENTIAL/PLATELET - Abnormal; Notable for the following:    Monocytes Absolute 1.1 (*)    All other components within normal limits  ETHANOL  URINE RAPID DRUG SCREEN, HOSP PERFORMED  TROPONIN I    Imaging Review Dg Chest 2 View  04/28/2016  CLINICAL DATA:  Anxiety, medical clearance EXAM: CHEST  2 VIEW COMPARISON:  03/25/2016 and 03/06/2016 FINDINGS: Cardiomediastinal silhouette is stable. No acute infiltrate or pleural effusion. No pulmonary edema. Stable right base medially atelectasis or scarring. Osteopenia and mild degenerative changes thoracic spine. IMPRESSION: No active disease. Stable right base medially atelectasis or scarring. Osteopenia and mild degenerative changes thoracic spine. Electronically Signed   By: Lahoma Crocker M.D.   On: 04/28/2016 11:54   I have personally reviewed and evaluated these images and  lab results as part of my medical decision-making.   EKG Interpretation   Date/Time:  Thursday Apr 28 2016 11:26:03 EDT Ventricular Rate:  88 PR Interval:  127 QRS Duration: 88 QT Interval:  369 QTC Calculation: 446 R Axis:   66 Text Interpretation:  Sinus rhythm Confirmed by Shawni Volkov MD, Maura Braaten ([id number])  on 04/28/2016 11:35:40 AM      MDM  PT'S COMPLAINTS ARE TYPICAL FOR HER ANXIETY.  WE WILL CONSULT TTS AND PLACE PT IN A PSYCH HOLD UNTIL A DETERMINATION CAN BE MADE ON DISPO.  PT SAW TTS WHO FELT THAT PT MEETS INPT CRITERIA.  ARRANGEMENTS WILL BE MADE FOR PSYCH INPT. PT ACCEPTED AT [facility] FROM THE ED AND THE NURSE WILL BRING HER OVER THERE. Final diagnoses:  Anxiety       Isla Pence, MD 04/28/16 [phone]

## 2016-04-28 NOTE — Progress Notes (Signed)
D   Pt is depressed and sad   She appears somewhat confused    She has an unsteady gait and has been isolative   She took her medications and is resting in bed presently A   Verbal support given   Medications administered and effectiveness monitored   Q 15 min checks R   Pt is safe at present

## 2016-04-28 NOTE — ED Notes (Signed)
Pt reports that she is married, but she and husband are estranged.

## 2016-04-28 NOTE — Progress Notes (Signed)
Patient ID: Audrey Meyer, female   DOB: 21-Apr-1956, 60 y.o.   MRN: [id number] Per State regulations 482.30 this chart was reviewed for medical necessity with respect to the patient's admission/duration of stay.    Next review date: 05/02/16  Debarah Crape, BSN, RN-BC  Case Manager

## 2016-04-28 NOTE — BH Assessment (Signed)
Per Noel Christmas, NP pt meets criteria for inpt. Psychiatric care. Per [place] nurse, pt accepted to [facility] [id number] time at 4:00-4:30 p.m. 04-28-16. Shanikqua Zarzycki K. Nash Shearer, LPC-A, [facility]  Counselor 04/28/2016 1:54 PM

## 2016-04-28 NOTE — ED Notes (Signed)
Patient ambulated to meet [facility] for transportation to [facility].

## 2016-04-28 NOTE — ED Notes (Signed)
Pt reports that she was sent to [facility] by [facility] d/t anxiety and insomnia.  Pt has been seen at [facility] facilities several times recently for anxiety.  Pt reports "I can't be at home by myself, because of the anxiety attacks."  Denies SI/HI/AV.  Pt reports taking her medication at 0500.

## 2016-04-28 NOTE — Discharge Instructions (Signed)
Panic Attacks °Panic attacks are sudden, short feelings of great fear or discomfort. You may have them for no reason when you are relaxed, when you are uneasy (anxious), or when you are sleeping.  °HOME CARE °· Take all your medicines as told. °· Check with your doctor before starting new medicines. °· Keep all doctor visits. °GET HELP IF: °· You are not able to take your medicines as told. °· Your symptoms do not get better. °· Your symptoms get worse. °GET HELP RIGHT AWAY IF: °· Your attacks seem different than your normal attacks. °· You have thoughts about hurting yourself or others. °· You take panic attack medicine and you have a side effect. °MAKE SURE YOU: °· Understand these instructions. °· Will watch your condition. °· Will get help right away if you are not doing well or get worse. °  °This information is not intended to replace advice given to you by your health care provider. Make sure you discuss any questions you have with your health care provider. °  °Document Released: 12/24/2010 Document Revised: 09/11/2013 Document Reviewed: 07/05/2013 °Elsevier Interactive Patient Education ©2016 Elsevier Inc. ° °

## 2016-04-28 NOTE — BH Assessment (Signed)
Tele Assessment Note   Audrey Meyer is an 60 y.o. female, who presents to [facility] ED via EMS after coming to [facility] and was sent to [facility] for medical clearance due to concerns, presenting with most recent panic attack and worsening anxiety/ depression. Patient state that her anxiety has become worse and that she was sent to be seen at [facility] as walk-in via [facility] due to safety concerns/ anxiety issues. Patient was recently seen[04-26-16] at ED for anxiety and was d/c with follow up for [facility]. Patient states that racing thoughts won't stop, and that often has panic attacks. Patient states that she does live by self and is worried for safety [does not know what she may do to self if at home]. Patient states that she only slept for 2 hours last night, and that her sleep patterns have been declining.  Patient acknowledges current SI with statemnets of not feeling safe returning hoem for fear of what may do to self and of worsening racing thoughts. Patinet does not identify direct plan at present. Patient has hx. Of SI and last attempt via unspecified means was x 1-2 months ago where she was hopitalized for inpatient care in [place] at unspecified location [pt states too disoriented to recall info but has info written down somewhere]. Patient denies current or past history of HI or AVH as well as substance abuse.  Patent cannot contract for safety at this time, lives alone, and appears unstable/ worsening condition.  Patient is dressed in scrubs and is alert and oriented x4. Patient speech was soft and slow with delay in though patterns [pt was slightly disoriented] and motor behavior appeared normal. Patient thought process is coherent. Patient does not appear to be responding to internal stimuli. Patient was cooperative throughout the assessment and states that she is agreeable to inpatient psychiatric treatment.   Diagnosis: 300.00 [F41.9] Unspecified Anxiety Disorder; 296.30 [F33.9] Major Depressive  Disorder, Recurrent Episode, Unspecified  Past Medical History:  Past Medical History  Diagnosis Date  . Hypothyroid   . High cholesterol   . Acid reflux   . COPD (chronic obstructive pulmonary disease) ([place])   . Hypertension   . Schizoaffective disorder   . Breast cancer [facility])     Past Surgical History  Procedure Laterality Date  . Mastectomy, partial    . Appendectomy      Family History:  Family History  Problem Relation Age of Onset  . Heart attack Father   . Cancer Other     Social History:  reports that she has been smoking Cigars.  She does not have any smokeless tobacco history on file. She reports that she does not drink alcohol or use illicit drugs.  Additional Social History:  Alcohol / Drug Use Pain Medications: SEE MAR Prescriptions: SEE MAR Over the Counter: SEE MAR History of alcohol / drug use?: No history of alcohol / drug abuse Longest period of sobriety (when/how long): NA  CIWA: CIWA-Ar BP: 112/61 mmHg Pulse Rate: 72 COWS:    PATIENT STRENGTHS: (choose at least two) Active sense of humor Average or above average intelligence  Allergies:  Allergies  Allergen Reactions  . Budesonide Shortness Of Breath    Home Medications:  (Not in a hospital admission)  OB/GYN Status:  No LMP recorded. Patient is postmenopausal.  General Assessment Data Location of Assessment: [facility] TTS Assessment: In system Is this a Tele or Face-to-Face Assessment?: Face-to-Face Is this an Initial Assessment or a Re-assessment for this encounter?: Initial  Assessment Marital status: Separated Is patient pregnant?: No Pregnancy Status: No Living Arrangements: Alone Can pt return to current living arrangement?: Yes Admission Status: Voluntary Is patient capable of signing voluntary admission?: Yes Referral Source: Self/Family/Friend Insurance type: medicare     Crisis Care Plan Living Arrangements: Alone Name of Psychiatrist: none Name of Therapist:  [facility]  Education Status Is patient currently in school?: No Current Grade: na Highest grade of school patient has completed: unknown Name of school: unknown Contact person: none given  Risk to self with the past 6 months Suicidal Ideation: Yes-Currently Present Has patient been a risk to self within the past 6 months prior to admission? : No Suicidal Intent: No Has patient had any suicidal intent within the past 6 months prior to admission? : Yes Is patient at risk for suicide?: No Suicidal Plan?: No Has patient had any suicidal plan within the past 6 months prior to admission? : No Access to Means: No Specify Access to Suicidal Means: na What has been your use of drugs/alcohol within the last 12 months?: none Previous Attempts/Gestures: Yes How many times?: 1 Other Self Harm Risks: lives alone Triggers for Past Attempts: Unpredictable Intentional Self Injurious Behavior: None Family Suicide History: No Recent stressful life event(s): Turmoil (Comment) Persecutory voices/beliefs?: No Depression: Yes Depression Symptoms: Despondent, Insomnia, Tearfulness, Isolating, Fatigue, Guilt, Loss of interest in usual pleasures, Feeling worthless/self pity Substance abuse history and/or treatment for substance abuse?: No Suicide prevention information given to non-admitted patients: Yes  Risk to Others within the past 6 months Homicidal Ideation: No Does patient have any lifetime risk of violence toward others beyond the six months prior to admission? : No Thoughts of Harm to Others: No Current Homicidal Intent: No Current Homicidal Plan: No Access to Homicidal Means: No Identified Victim: n/a History of harm to others?: No Assessment of Violence: None Noted Violent Behavior Description: na Does patient have access to weapons?: No Criminal Charges Pending?: No Does patient have a court date: No Is patient on probation?: No  Psychosis Hallucinations: None noted Delusions: None  noted  Mental Status Report Appearance/Hygiene: Disheveled, In scrubs Eye Contact: Poor Motor Activity: Agitation Speech: Soft, Slow Level of Consciousness: Alert Mood: Depressed, Anxious Affect: Anxious, Depressed Anxiety Level: Panic Attacks Panic attack frequency:  (unpredictable) Most recent panic attack: 04/28/16 Thought Processes: Circumstantial Judgement: Partial Orientation: Person, Place, Time, Situation, Appropriate for developmental age Obsessive Compulsive Thoughts/Behaviors: None  Cognitive Functioning Concentration: Decreased Memory: Recent Intact, Remote Intact IQ: Average Insight: Poor Impulse Control: Poor Appetite: Fair Weight Loss: 0 Weight Gain: 0 Sleep: Decreased Total Hours of Sleep: 2 Vegetative Symptoms: None  ADLScreening [facility] Assessment Services) Patient's cognitive ability adequate to safely complete daily activities?: Yes Patient able to express need for assistance with ADLs?: Yes Independently performs ADLs?: Yes (appropriate for developmental age)  Prior Inpatient Therapy Prior Inpatient Therapy: Yes Prior Therapy Dates: 04/2016 Prior Therapy Facilty/Provider(s): [facility], unspecified Reason for Treatment: SI/ depression  Prior Outpatient Therapy Prior Outpatient Therapy: Yes Prior Therapy Dates: current Prior Therapy Facilty/Provider(s): [facility] Reason for Treatment: depression Does patient have an ACCT team?: No Does patient have Intensive In-House Services?  : No Does patient have [facility] services? : No  ADL Screening (condition at time of admission) Patient's cognitive ability adequate to safely complete daily activities?: Yes Is the patient deaf or have difficulty hearing?: No Does the patient have difficulty seeing, even when wearing glasses/contacts?: No Does the patient have difficulty concentrating, remembering, or making decisions?: No Patient able to  express need for assistance with ADLs?: Yes Does the patient have  difficulty dressing or bathing?: No Independently performs ADLs?: Yes (appropriate for developmental age) Does the patient have difficulty walking or climbing stairs?: No Weakness of Legs: None Weakness of Arms/Hands: None  Home Assistive Devices/Equipment Home Assistive Devices/Equipment: None    Abuse/Neglect Assessment (Assessment to be complete while patient is alone) Physical Abuse: Denies Verbal Abuse: Denies Sexual Abuse: Denies Exploitation of patient/patient's resources: Denies Self-Neglect: Denies Values / Beliefs Cultural Requests During Hospitalization: None Spiritual Requests During Hospitalization: None   Advance Directives (For Healthcare) Does patient have an advance directive?: No Would patient like information on creating an advanced directive?: No - patient declined information    Additional Information 1:1 In Past 12 Months?: No CIRT Risk: No Elopement Risk: No Does patient have medical clearance?: Yes     Disposition: Per [place], NP meets inpt criteria. Disposition Initial Assessment Completed for this Encounter: Yes Disposition of Patient: Other dispositions  Kristeen Mans 04/28/2016 1:02 PM

## 2016-04-28 NOTE — Tx Team (Signed)
Initial Interdisciplinary Treatment Plan   PATIENT STRESSORS: Educational concerns Financial difficulties Health problems Legal issue   PATIENT STRENGTHS: Ability for insight Active sense of humor Average or above average intelligence Capable of independent living   PROBLEM LIST: Problem List/Patient Goals Date to be addressed Date deferred Reason deferred Estimated date of resolution  Panic and Anxiety Disorder 04/28/16     shizoaffective Disorder 04/28/16     Insomnia 04/28/2016                       " I take my medicines" 04/28/16     " I beat cancer" 04/28/2016            DISCHARGE CRITERIA:  Ability to meet basic life and health needs Adequate post-discharge living arrangements Improved stabilization in mood, thinking, and/or behavior Medical problems require only outpatient monitoring  PRELIMINARY DISCHARGE PLAN: Attend aftercare/continuing care group Attend PHP/IOP Attend 12-step recovery group  PATIENT/FAMIILY INVOLVEMENT: This treatment plan has been presented to and reviewed with the patient, Audrey Meyer, and/or family member, .  The patient and family have been given the opportunity to ask questions and make suggestions.  [name] 04/28/2016, 8:07 PM

## 2016-04-28 NOTE — ED Notes (Signed)
Report called to [place] at [facility]

## 2016-04-28 NOTE — ED Notes (Signed)
Bed: [id number] Expected date:  Expected time:  Means of arrival:  Comments: EMS- [place] patient, cool and clammy

## 2016-04-28 NOTE — ED Notes (Signed)
Pt is aware we need a urine sample, but doesn't need to go at this time. Pt will notify when able to void.

## 2016-04-28 NOTE — ED Notes (Signed)
[facility] called to arrange transportation for patient to [facility].

## 2016-04-28 NOTE — Progress Notes (Signed)
Audrey Meyer is a 60 yo caucasian female who is admitted voluntarily to [facility] today ( she says she has been here before but cannot remember when) " to get my meds right". She says she is a patient at [place] ( and [place] [facility] in [place]) and that she does not live with her husband " we 're just friends now" and has a PMH of schizophrenia , HTN, anxiety with panic attacks, high cholesterol, IBS and she is s/p lumpectomy with breast cancer ( 2004) after undergoing radiation and chemotherapy NO BLOOD PRESSURES IN HER LEFT ARM. She walks on her tip toes and says she needs to have her shoes on to help her ambulate her better, reprots she is s/p ruptured appendix and bleeding ulcers with resulting colitis. She deneis she has suicidal ideation, states she hasn't been able to sleep " in days" and that she thinks she needs to get her " paxil adjusted". She reports, also, that her panic attacks have been " getting out of control"and ' that's why I'm here". After admission is completed she is taken to her room and orders obtained.

## 2016-04-29 ENCOUNTER — Encounter (HOSPITAL_COMMUNITY): Payer: Self-pay | Admitting: Psychiatry

## 2016-04-29 DIAGNOSIS — F332 Major depressive disorder, recurrent severe without psychotic features: Principal | ICD-10-CM

## 2016-04-29 LAB — LIPID PANEL
CHOL/HDL RATIO: 2.3 ratio
Cholesterol: 113 mg/dL (ref 0–200)
HDL: 49 mg/dL (ref >40)
LDL CALC: 42 mg/dL (ref 0–99)
Triglycerides: 109 mg/dL (ref <150)
VLDL: 22 mg/dL (ref 0–40)

## 2016-04-29 LAB — T4, FREE: FREE T4: 1 ng/dL (ref 0.61–1.12)

## 2016-04-29 MED ORDER — DIAZEPAM 5 MG PO TABS
5.0000 mg | ORAL_TABLET | Freq: Two times a day (BID) | ORAL | Status: DC
  Administered 2016-04-29 – 2016-05-02 (×6): 5 mg via ORAL
  Filled 2016-04-29 (×6): qty 1

## 2016-04-29 MED ORDER — SERTRALINE HCL 25 MG PO TABS
25.0000 mg | ORAL_TABLET | Freq: Every day | ORAL | Status: DC
  Administered 2016-04-29 – 2016-04-30 (×2): 25 mg via ORAL
  Filled 2016-04-29 (×5): qty 1

## 2016-04-29 MED ORDER — DIAZEPAM 2 MG PO TABS
2.0000 mg | ORAL_TABLET | Freq: Three times a day (TID) | ORAL | Status: DC | PRN
  Administered 2016-04-29 – 2016-04-30 (×3): 2 mg via ORAL
  Filled 2016-04-29 (×3): qty 1

## 2016-04-29 MED ORDER — HYDRALAZINE HCL 25 MG PO TABS
75.0000 mg | ORAL_TABLET | Freq: Three times a day (TID) | ORAL | Status: DC
  Administered 2016-04-29 (×2): 75 mg via ORAL
  Administered 2016-04-30: 25 mg via ORAL
  Administered 2016-04-30 – 2016-05-03 (×10): 75 mg via ORAL
  Filled 2016-04-29 (×19): qty 3

## 2016-04-29 NOTE — Progress Notes (Signed)
D   Pt has isolated to her room and has not interacted with anyone   She is mildly confused and she continues to request medications frequently   She has a blunted affect   She responds well to prompting but sometimes needs reminding A    Verbal support given   Reorientation and redirection as needed   Medications administered and effectiveness monitored    Q 15 min checks R   Pt is safe at present

## 2016-04-29 NOTE — BHH Group Notes (Signed)
[facility] LCSW Aftercare Discharge Planning Group Note  04/29/2016  8:45 AM  Participation Quality: Did Not Attend. Patient invited to participate but declined.  Tilden Fossa, MSW, [place] Clinical Social Worker [facility] [phone]

## 2016-04-29 NOTE — Tx Team (Signed)
Interdisciplinary Treatment Plan Update (Adult) Date: 04/29/2016   Date: 04/29/2016 11:29 AM  Progress in Treatment:  Attending groups: Audrey Meyer is new to milieu, continuing to assess  Participating in groups: Audrey Meyer is new to milieu, continuing to assess   Taking medication as prescribed: Yes  Tolerating medication: Yes  Family/Significant othe contact made: No, CSW attempting to make contact with husband Patient understands diagnosis: Yes AEB seeking help with anxiety Discussing patient identified problems/goals with staff: Yes  Medical problems stabilized or resolved: Yes  Denies suicidal/homicidal ideation: Yes Patient has not harmed self or Others: Yes   New problem(s) identified: None identified at this time.   Discharge Plan or Barriers: Audrey Meyer will return home and follow-up at [facility]  Additional comments:  Patient and CSW reviewed Audrey Meyer's identified goals and treatment plan. Patient verbalized understanding and agreed to treatment plan. CSW reviewed [facility] "Discharge Process and Patient Involvement" Form. Audrey Meyer verbalized understanding of information provided and signed form.   Reason for Continuation of Hospitalization:  Anxiety Medication stabilization   Estimated length of stay: 3-5 days  Review of initial/current patient goals per problem list:   1.  Goal(s): Patient will participate in aftercare plan  Met:  Yes  Target date: 3-5 days from date of admission   As evidenced by: Patient will participate within aftercare plan AEB aftercare provider and housing plan at discharge being identified.   04/29/16: Audrey Meyer will return home and follow-up at [facility]  3.  Goal(s): Patient will demonstrate decreased signs and symptoms of anxiety.  Met:  No  Target date: 3-5 days from date of admission   As evidenced by: Patient will utilize self rating of anxiety at 3 or below and demonstrated decreased signs of anxiety, or be deemed stable for discharge by MD 04/29/16: Audrey Meyer was admitted with increased  levels of anxiety and is currently rating those symptoms highly. Audrey Meyer will demonstrated decreased symptoms of anxiety and rate it at 3/10 prior to d/c.   Attendees:  Patient:    Family:    Physician: Dr. Parke Poisson, MD  04/29/2016 11:29 AM  Nursing: Lars Pinks, RN Case manager  04/29/2016 11:29 AM  Clinical Social Worker Peri Maris, [place] 04/29/2016 11:29 AM  Other: Tilden Fossa, [place] 04/29/2016 11:29 AM  Clinical: [place] Police RN; Luz Brazen, RN 04/29/2016 11:29 AM  Other: , RN Charge Nurse 04/29/2016 11:29 AM  Other:    Peri Maris, [place] Social Work [phone]

## 2016-04-29 NOTE — Progress Notes (Signed)
Adult Psychoeducational Group Note  Date:  04/29/2016 Time:  8:46 PM  Group Topic/Focus:  Wrap-Up Group:   The focus of this group is to help patients review their daily goal of treatment and discuss progress on daily workbooks.  Participation Level:  Did Not Attend  Participation Quality:  Did not attend  Affect:  Did not attend  Cognitive:  Did not attend  Insight: None  Engagement in Group:  Did not attend  Modes of Intervention:  Did not attend  Additional Comments:  Patient did not attend wrap up group tonight.   Audrey Meyer L Audrey Meyer 04/29/2016, 8:46 PM

## 2016-04-29 NOTE — BHH Group Notes (Signed)
[place] LCSW Group Therapy 04/29/2016 1:15pm  Type of Therapy: Group Therapy- Feelings Around Relapse and Recovery  Pt did not attend, declined invitation.    Peri Maris, Latanya Presser [phone] 04/29/2016 2:26 PM

## 2016-04-29 NOTE — Plan of Care (Signed)
Problem: Education: Goal: Ability to state activities that reduce stress will improve Outcome: Not Progressing Unable to use and practice calming techniques at this time  Problem: Coping: Goal: Ability to identify and develop effective coping behavior will improve Outcome: Not Progressing unable to identify or use any coping strategies. Relies on medication currently.  Problem: Self-Concept: Goal: Ability to identify factors that promote anxiety will improve Outcome: Not Progressing Unable to identify factors that are causing her anxiety.

## 2016-04-29 NOTE — Progress Notes (Signed)
D: Patient complaining frequently of "panic attack" - states that she feels very anxious, shaking, is "disoriented" but upon exploration of disorientation patient is oriented x4 but says "I feel like I don't know what to do". Requesting Valium frequently and needs reminders of when it can next be administered. C/O nausea and poor appetite. A:  Leg shaking but no tremors noted. Isolating in room, very flat facial expression, frequent complaints of anxiety, panic, nausea. Reported very poor sleep. Drinking Dr. Malachi Bonds in room P: patient to be evaluated by MD, give frequent reassurance, attempt to distract and to teach self soothing skills. Communicate medication times, frequent monitoring.

## 2016-04-29 NOTE — BHH Suicide Risk Assessment (Signed)
[facility] Admission Suicide Risk Assessment   Nursing information obtained from:   patient, chart  Demographic factors:   60 year old female, lives alone , on disability  Current Mental Status:   see below  Loss Factors:   disability, limited support network  Historical Factors:   anxiety, depression  Risk Reduction Factors:   resilience   Total Time spent with patient: 45 minutes Principal Problem:] MDD, Anxiety Disorder  Diagnosis:   Patient Active Problem List   Diagnosis Date Noted  . Major depressive disorder, recurrent episode ([place]) [F33.9] 04/28/2016  . Chronic obstructive pulmonary disease ([place]) [J44.9]   . Overdose [T50.901A] 03/07/2016  . Hypokalemia [E87.6] 03/07/2016  . Hypothyroidism [E03.9] 03/07/2016  . Schizoaffective disorder ([place]) [F25.9] 03/07/2016  . Essential hypertension [I10] 03/07/2016  . COPD exacerbation ([place]) [J44.1] 03/07/2016  . Tobacco abuse [Z72.0] 03/07/2016  . Pneumonia [J18.9] 03/06/2016  . CAP (community acquired pneumonia) [J18.9] 03/06/2016     Continued Clinical Symptoms:  Alcohol Use Disorder Identification Test Final Score (AUDIT): 0 The "Alcohol Use Disorders Identification Test", Guidelines for Use in Primary Care, Second Edition.  World Pharmacologist [facility]). Score between 0-7:  no or low risk or alcohol related problems. Score between 8-15:  moderate risk of alcohol related problems. Score between 16-19:  high risk of alcohol related problems. Score 20 or above:  warrants further diagnostic evaluation for alcohol dependence and treatment.   CLINICAL FACTORS:  60 year old female, lives alone, on disability, reports history of depression and history of anxiety. Reports worsening anxiety, with increased frequency of panic symptoms, panic attacks. Has been on Valium for many years, states she had normally been taking 5 mgrs per day, but that recently has increased dose .      Psychiatric Specialty Exam: Physical Exam  ROS  Blood  pressure 160/68, pulse 102, temperature 97.8 F (36.6 C), temperature source Oral, resp. rate 16, height 5\' 2"  (1.575 m), weight 135 lb 5 oz (61.377 kg), SpO2 94 %.Body mass index is 24.74 kg/(m^2).   see admit note MSE                                                         COGNITIVE FEATURES THAT CONTRIBUTE TO RISK:  Closed-mindedness and Loss of executive function    SUICIDE RISK:   Moderate:  Frequent suicidal ideation with limited intensity, and duration, some specificity in terms of plans, no associated intent, good self-control, limited dysphoria/symptomatology, some risk factors present, and identifiable protective factors, including available and accessible social support.  PLAN OF CARE: Patient will be admitted to inpatient psychiatric unit for stabilization and safety. Will provide and encourage milieu participation. Provide medication management and maked adjustments as needed.  Will follow daily.    I certify that inpatient services furnished can reasonably be expected to improve the patient's condition.   Neita Garnet, MD 04/29/2016, 1:21 PM
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
## 2016-04-29 NOTE — BHH Counselor (Signed)
Adult Comprehensive Assessment  Patient ID: Audrey Meyer, female   DOB: 1955/12/22, 60 y.o.   MRN: [id number]  Information Source: Information source: Patient  Current Stressors:  Educational / Learning stressors: None reported Employment / Job issues: Pt is on disability Family Relationships: Limited relationship with daughter Museum/gallery curator / Lack of resources (include bankruptcy): None reported Housing / Lack of housing: feels that her place is very disorganized and she feels uncomfortable there Physical health (include injuries & life threatening diseases): None reported to CSW Social relationships: None reported Substance abuse: Pt denies Bereavement / Loss: None reported  Living/Environment/Situation:  Living Arrangements: Alone Living conditions (as described by patient or guardian): stable- has supportive neighbors How long has patient lived in current situation?: approximately 9 months What is atmosphere in current home: Comfortable (not comfortable because she is "unorganized" )  Family History:  Marital status: Separated Separated, when?: since last August- Married for 24yrs What types of issues is patient dealing with in the relationship?: get along well together Does patient have children?: Yes How many children?: 2 How is patient's relationship with their children?: daughter is 93, son is 35; good relationship with son but limited relationship with daughter- they both live in [place]  Childhood History:  By whom was/is the patient raised?: Both parents, Grandparents Description of patient's relationship with caregiver when they were a child: good relationship with parents Patient's description of current relationship with people who raised him/her: both are deceased  Does patient have siblings?: Yes Number of Siblings: 3 Description of patient's current relationship with siblings: not close "right now" Did patient suffer any verbal/emotional/physical/sexual abuse as a  child?: No Did patient suffer from severe childhood neglect?: No Has patient ever been sexually abused/assaulted/raped as an adolescent or adult?: No Was the patient ever a victim of a crime or a disaster?: No Witnessed domestic violence?: No Has patient been effected by domestic violence as an adult?: No  Education:  Highest grade of school patient has completed: has GED Currently a Ship broker?: No Learning disability?: Yes What learning problems does patient have?: "I was slow at learning"  Employment/Work Situation:   Employment situation: On disability Why is patient on disability: mental health How long has patient been on disability: "a long time" Patient's job has been impacted by current illness:  (n/a) Has patient ever been in the [employer]?: No Has patient ever served in combat?: No Did You Receive Any Psychiatric Treatment/Services While in Passenger transport manager?: No Are There Guns or Other Weapons in Your Home?: No  Financial Resources:   Museum/gallery curator resources: Teacher, early years/pre, Information systems manager, [employer], Food stamps  Alcohol/Substance Abuse:   What has been your use of drugs/alcohol within the last 12 months?: Pt denies If attempted suicide, did drugs/alcohol play a role in this?: No Alcohol/Substance Abuse Treatment Hx: Denies past history Has alcohol/substance abuse ever caused legal problems?: No  Social Support System:   Pensions consultant Support System: Manufacturing engineer System: husband and son are supportive Type of faith/religion: Pentecostal  How does patient's faith help to cope with current illness?: "until I got like this it was fine"  Leisure/Recreation:   Leisure and Hobbies: texting, watching television  Strengths/Needs:   What things does the patient do well?: needle work  In what areas does patient struggle / problems for patient: "most everything right now"   Discharge Plan:   Does patient have access to transportation?: Yes Plan for no access to  transportation at discharge: husband takes her now; has her  license Will patient be returning to same living situation after discharge?: Yes Currently receiving community mental health services: Yes (From Whom) [facility]- for meds: Dr. Josph Macho) If no, would patient like referral for services when discharged?: No Does patient have financial barriers related to discharge medications?: No  Summary/Recommendations:     Patient is a 60 year old female admitted with a diagnosis of Generalized Anxiety Disorder and Schizoaffective Disorder by history. Pt presented to the hospital with increased anxiety and frequent panic attacks. Pt reports primary trigger(s) for admission was that her house is not organized which is causing her to feel overwhelmed. Patient will benefit from crisis stabilization, medication evaluation, group therapy and psycho education in addition to case management for discharge planning. At discharge it is recommended that Pt remain compliant with established discharge plan and continued treatment.    Bo Mcclintock. 04/29/2016

## 2016-04-30 DIAGNOSIS — F332 Major depressive disorder, recurrent severe without psychotic features: Secondary | ICD-10-CM | POA: Insufficient documentation

## 2016-04-30 DIAGNOSIS — E0590 Thyrotoxicosis, unspecified without thyrotoxic crisis or storm: Secondary | ICD-10-CM

## 2016-04-30 LAB — T3, FREE: T3, Free: 5.5 pg/mL — ABNORMAL HIGH (ref 2.0–4.4)

## 2016-04-30 LAB — TSH: TSH: 0.038 u[IU]/mL — ABNORMAL LOW (ref 0.350–4.500)

## 2016-04-30 MED ORDER — DIAZEPAM 2 MG PO TABS
2.0000 mg | ORAL_TABLET | Freq: Two times a day (BID) | ORAL | Status: DC | PRN
  Administered 2016-05-01 – 2016-05-03 (×4): 2 mg via ORAL
  Filled 2016-04-30 (×4): qty 1

## 2016-04-30 MED ORDER — HYDROXYZINE HCL 25 MG PO TABS
ORAL_TABLET | ORAL | Status: AC
  Administered 2016-04-30: 25 mg
  Filled 2016-04-30: qty 1

## 2016-04-30 MED ORDER — HYDROXYZINE HCL 25 MG PO TABS
25.0000 mg | ORAL_TABLET | Freq: Three times a day (TID) | ORAL | Status: DC | PRN
  Administered 2016-04-30 – 2016-05-03 (×5): 25 mg via ORAL
  Filled 2016-04-30 (×5): qty 1

## 2016-04-30 MED ORDER — SERTRALINE HCL 50 MG PO TABS
50.0000 mg | ORAL_TABLET | Freq: Every day | ORAL | Status: DC
  Administered 2016-05-01 – 2016-05-03 (×3): 50 mg via ORAL
  Filled 2016-04-30 (×5): qty 1

## 2016-04-30 NOTE — Progress Notes (Signed)
Psychoeducational Group Note  Date:  04/30/2016 Time: 1100  Group Topic/Focus:  Identifying Needs:   The focus of this group is to help patients identify their personal needs that have been historically problematic and identify healthy behaviors to address their needs.  Participation Level:  Did Not Attend  Participation Quality:  N/A  Affect: N/A   Cognitive:  N/A  Insight: N/A   Engagement in Group:  N/A  Additional Comments:    04/30/2016,2:23 PM Zarahi Fuerst, Trixie Rude

## 2016-04-30 NOTE — Progress Notes (Signed)
Psychoeducational Group Note  Date:  04/30/2016 Time:  0900  Group Topic/Focus:  Recovery Goals:   The focus of this group is to identify appropriate goals for recovery and establish a plan to achieve them.  Participation Level: Did Not Attend  Participation Quality:  Not Applicable  Affect:  Not Applicable  Cognitive:  Not Applicable  Insight:  Not Applicable  Engagement in Group: Not Applicable  Additional Comments:    [name] 04/30/2016, 9:19 AM

## 2016-04-30 NOTE — Progress Notes (Signed)
D   Pt has isolated to her room and has not interacted with anyone   She is mildly confused and she continues to request medications frequently   She has a blunted affect   She responds well to prompting but sometimes needs reminding A    Verbal support given   Reorientation and redirection as needed   Medications administered and effectiveness monitored    Q 15 min checks R   Pt is safe at present

## 2016-04-30 NOTE — Consult Note (Signed)
Medical Consultation   Audrey Meyer  [id number]  DOB: [birth year]  DOA: 04/28/2016  PCP: Dwan Bolt, MD   Requesting physician: Dr. Parke Poisson  Reason for consultation: hyperthyroidism   History of Present Illness: Audrey Meyer is an 60 y.o. female past medical history significant for hypothyroidism schizoaffective disorder and breast cancer in remission during severe because of depressive disorder and anxiety attacks he is on thyroid hormonal therapy her TSH was second was 0.038 with elevated free T4 so were consulted for further evaluation.    Review of Systems:  ROS Constitutional: No fever, no chills;  Appetite normal; No weight loss, no weight gain, no fatigue.   HEENT: No blurry vision, no diplopia, no pharyngitis, no dysphagia  CV: No chest pain, no palpitations, no PND, no orthopnea, no edema.   Resp: No SOB, no cough, no pleuritic pain.  GI: No nausea, no vomiting, no diarrhea, no melena, no hematochezia, no constipation, no abdominal pain.   GU: No dysuria, no hematuria, no frequency, no urgency.  MSK: No myalgias, no arthralgias.   Neuro:  No headache, no focal neurological deficits, no history of seizures.   Psych: No depression, no anxiety.   Endo: No heat intolerance, no cold intolerance, no polyuria, no polydipsia   Skin: No rashes, no skin lesions.   Heme: No easy bruising.   Travel history: No recent travel.   Past Medical History: Past Medical History  Diagnosis Date  . Hypothyroid   . High cholesterol   . Acid reflux   . COPD (chronic obstructive pulmonary disease) ([place])   . Hypertension   . Schizoaffective disorder   . Breast cancer [facility])     Past Surgical History: Past Surgical History  Procedure Laterality Date  . Mastectomy, partial    . Appendectomy       Allergies:   Allergies  Allergen Reactions  . Budesonide Shortness Of Breath     Social History:  reports that she has been smoking Cigars.  She  does not have any smokeless tobacco history on file. She reports that she does not drink alcohol or use illicit drugs.   Family History: Family History  Problem Relation Age of Onset  . Heart attack Father   . Cancer Other     Unacceptable: Noncontributory, unremarkable, or negative. Acceptable: Family history reviewed and not pertinent (If you reviewed it)   Physical Exam: Filed Vitals:   04/29/16 1700 04/30/16 0623 04/30/16 0624 04/30/16 1152  BP:  154/76 127/73 133/75  Pulse: 77 97 102 108  Temp: 97.9 F (36.6 C) 97.4 F (36.3 C)    TempSrc: Oral     Resp:  24    Height:      Weight:      SpO2:        Constitutional:  Alert and awake, oriented x3, not in any acute distress. Eyes: PERLA, EOMI, irises appear normal, anicteric sclera,  ENMT: external ears and nose appear normal.            Lips appears normal, oropharynx mucosa, tongue, posterior pharynx appear normal  Neck: neck appears normal, no masses, normal ROM, no thyromegaly, no JVD  CVS: S1-S2 clear, no murmur rubs or gallops, no LE edema, normal pedal pulses  Respiratory:  clear to auscultation bilaterally, no wheezing, rales or rhonchi. Respiratory effort normal. No accessory muscle use.  Abdomen: soft nontender, nondistended, normal bowel sounds, no hepatosplenomegaly,  no hernias  Musculoskeletal: : no cyanosis, clubbing or edema noted bilaterally                        Neuro: Cranial nerves II-XII intact, strength, sensation, reflexes Psych: judgement and insight appear normal, stable mood and affect, mental status Skin: no rashes or lesions or ulcers, no induration or nodules    Data reviewed:  I have personally reviewed following labs and imaging studies Labs:  CBC:  Recent Labs Lab 04/25/16 2317 04/28/16 1126  WBC 9.2 8.4  NEUTROABS 5.1 5.9  HGB 12.9 12.9  HCT 36.8 38.3  MCV 88.2 91.6  PLT 286 [id number]    Basic Metabolic Panel:  Recent Labs Lab 04/25/16 2317 04/28/16 1126  NA 135 136  K  3.7 4.5  CL 103 105  CO2 24 23  GLUCOSE 110* 122*  BUN 11 18  CREATININE 0.67 0.59  CALCIUM 9.6 9.7   GFR Estimated Creatinine Clearance: 62.8 mL/min (by C-G formula based on Cr of 0.59). Liver Function Tests:  Recent Labs Lab 04/25/16 2317 04/28/16 1126  AST 21 25  ALT 20 21  ALKPHOS 61 55  BILITOT 0.6 0.8  PROT 7.8 7.5  ALBUMIN 4.6 4.5   No results for input(s): LIPASE, AMYLASE in the last 168 hours. No results for input(s): AMMONIA in the last 168 hours. Coagulation profile No results for input(s): INR, PROTIME in the last 168 hours.  Cardiac Enzymes:  Recent Labs Lab 04/28/16 1126  TROPONINI <0.03   BNP: Invalid input(s): POCBNP CBG: No results for input(s): GLUCAP in the last 168 hours. D-Dimer No results for input(s): DDIMER in the last 72 hours. Hgb A1c No results for input(s): HGBA1C in the last 72 hours. Lipid Profile  Recent Labs  04/29/16 0617  CHOL 113  HDL 49  LDLCALC 42  TRIG 109  CHOLHDL 2.3   Thyroid function studies  Recent Labs  04/29/16 1827 04/30/16 0610  TSH  --  0.038*  T3FREE 5.5*  --    Anemia work up No results for input(s): VITAMINB12, FOLATE, FERRITIN, TIBC, IRON, RETICCTPCT in the last 72 hours. Urinalysis    Component Value Date/Time   COLORURINE YELLOW 03/25/2016 1640   APPEARANCEUR CLOUDY* 03/25/2016 1640   LABSPEC 1.014 03/25/2016 1640   PHURINE 5.0 03/25/2016 1640   GLUCOSEU NEGATIVE 03/25/2016 1640   HGBUR NEGATIVE 03/25/2016 1640   BILIRUBINUR NEGATIVE 03/25/2016 1640   KETONESUR NEGATIVE 03/25/2016 1640   PROTEINUR NEGATIVE 03/25/2016 1640   UROBILINOGEN 0.2 09/19/2012 2334   NITRITE NEGATIVE 03/25/2016 1640   LEUKOCYTESUR TRACE* 03/25/2016 1640     Microbiology No results found for this or any previous visit (from the past 240 hour(s)).     Inpatient Medications:   Scheduled Meds: . atorvastatin  20 mg Oral QHS  . diazepam  5 mg Oral BID  . hydrALAZINE  75 mg Oral TID  . levothyroxine   100 mcg Oral Daily  . potassium chloride  20 mEq Oral BID  . ramelteon  8 mg Oral QHS  . [START ON 05/01/2016] sertraline  50 mg Oral Daily  . spironolactone  25 mg Oral Daily   Continuous Infusions:    Radiological Exams on Admission: No results found.  Impression/Recommendations Principal Problem:   Major depressive disorder, recurrent episode ([place]) Active Problems:   Severe episode of recurrent major depressive disorder, without psychotic features ([place])   Hyperthyroidism Is currently on 2 medications for hypothyroidism at home Cytomel  and Synthroid. I'm not sure why she is on 125 g of thyroid hormone therapy which is on the high side for her weight. I will go ahead and DC Cytomel continue Synthroid and she will need to have her TSH and free T4 rechecked in 6 weeks  Other medical problems are followed by the psychiatrist.  Thank you for this consultation.     Time Spent: 75 min  Charlynne Cousins M.D. [facility] 04/30/2016, 4:55 PM

## 2016-04-30 NOTE — Progress Notes (Signed)
D: patient has been present in the milieu at times without encouragement from staff but has minimal interaction with peers. Attended groups. States that she had much better sleep last night. Continues to be tremulous with stiff shuffling gait and poor coordination. Continues to report poor appetite. Reports improved but continued anxiety. A: Provided medication and education about current medication regimen. Encouraged patient to increase movement, ambulation and milieu interaction. R: Pt cooperative but isolative. Spontaneous verbalization increased today but still minimal.

## 2016-04-30 NOTE — Progress Notes (Addendum)
[facility] MD Progress Note  04/30/2016 4:11 PM Audrey Meyer  MRN:  [id number] Subjective:  She reports she continues to feel anxious, but does state she is " a little better" and that today her sense of panic is less . Denies medication side effects. Objective : I have reviewed chart notes, and have met with patient . Patient presents anxious, but not in any acute distress- has not tremors, no diaphoresis, no acute distress or agitation . Vitals - 133/75, pulse 108. She denies visual disturbances, headache, nausea, or vomiting . She tends to isolate in her room, with little participation in milieu activities thus far, but up for meals. States she did eat her dinner and her breakfast this AM. Denies suicidal ideations , denies homicidal ideations, denies hallucinations .  Principal Problem: Major depressive disorder, recurrent episode ([place]) Diagnosis:   Patient Active Problem List   Diagnosis Date Noted  . Major depressive disorder, recurrent episode ([place]) [F33.9] 04/28/2016  . Chronic obstructive pulmonary disease ([place]) [J44.9]   . Overdose [T50.901A] 03/07/2016  . Hypokalemia [E87.6] 03/07/2016  . Hypothyroidism [E03.9] 03/07/2016  . Schizoaffective disorder ([place]) [F25.9] 03/07/2016  . Essential hypertension [I10] 03/07/2016  . COPD exacerbation ([place]) [J44.1] 03/07/2016  . Tobacco abuse [Z72.0] 03/07/2016  . Pneumonia [J18.9] 03/06/2016  . CAP (community acquired pneumonia) [J18.9] 03/06/2016   Total Time spent with patient: 20 minutes    Past Medical History:  Past Medical History  Diagnosis Date  . Hypothyroid   . High cholesterol   . Acid reflux   . COPD (chronic obstructive pulmonary disease) ([place])   . Hypertension   . Schizoaffective disorder   . Breast cancer [facility])     Past Surgical History  Procedure Laterality Date  . Mastectomy, partial    . Appendectomy     Family History:  Family History  Problem Relation Age of Onset  . Heart attack Father   . Cancer  Other     Social History:  History  Alcohol Use No     History  Drug Use No    Social History   Social History  . Marital Status: Legally Separated    Spouse Name: N/A  . Number of Children: N/A  . Years of Education: N/A   Social History Main Topics  . Smoking status: Current Every Day Smoker -- 0.00 packs/day for 2 years    Types: Cigars  . Smokeless tobacco: None  . Alcohol Use: No  . Drug Use: No  . Sexual Activity: No   Other Topics Concern  . None   Social History Narrative   Additional Social History:    Pain Medications:  N/A Prescriptions: N/A Over the Counter: N/A History of alcohol / drug use?: No history of alcohol / drug abuse Longest period of sobriety (when/how long): N/A Negative Consequences of Use: Legal  Sleep: Good  Appetite:  Fair  Current Medications: Current Facility-Administered Medications  Medication Dose Route Frequency Provider Last Rate Last Dose  . acetaminophen (TYLENOL) tablet 650 mg  650 mg Oral Q6H PRN Encarnacion Slates, NP      . albuterol (PROVENTIL HFA;VENTOLIN HFA) 108 (90 Base) MCG/ACT inhaler 2 puff  2 puff Inhalation Q6H PRN Encarnacion Slates, NP      . alum & mag hydroxide-simeth (MAALOX/MYLANTA) 200-200-20 MG/5ML suspension 30 mL  30 mL Oral Q4H PRN Encarnacion Slates, NP      . atorvastatin (LIPITOR) tablet 20 mg  20 mg Oral QHS Encarnacion Slates,  NP   20 mg at 04/29/16 2202  . diazepam (VALIUM) tablet 2 mg  2 mg Oral Q8H PRN Jenne Campus, MD   2 mg at 04/30/16 1201  . diazepam (VALIUM) tablet 5 mg  5 mg Oral BID Jenne Campus, MD   5 mg at 04/30/16 0851  . hydrALAZINE (APRESOLINE) tablet 75 mg  75 mg Oral TID Jenne Campus, MD   75 mg at 04/30/16 1201  . hydrOXYzine (ATARAX/VISTARIL) tablet 25 mg  25 mg Oral TID PRN Jenne Campus, MD   25 mg at 04/30/16 1319  . levothyroxine (SYNTHROID, LEVOTHROID) tablet 100 mcg  100 mcg Oral Daily Encarnacion Slates, NP   100 mcg at 04/30/16 9983  . liothyronine (CYTOMEL) tablet 12.5 mcg   12.5 mcg Oral BID Encarnacion Slates, NP   12.5 mcg at 04/30/16 3825  . magnesium hydroxide (MILK OF MAGNESIA) suspension 30 mL  30 mL Oral Daily PRN Encarnacion Slates, NP      . ondansetron (ZOFRAN-ODT) disintegrating tablet 4 mg  4 mg Oral Q6H PRN Patrecia Pour, NP   4 mg at 04/30/16 0129  . potassium chloride (K-DUR,KLOR-CON) CR tablet 20 mEq  20 mEq Oral BID Jenne Campus, MD   20 mEq at 04/30/16 0852  . ramelteon (ROZEREM) tablet 8 mg  8 mg Oral QHS Encarnacion Slates, NP   8 mg at 04/29/16 2201  . sertraline (ZOLOFT) tablet 25 mg  25 mg Oral Daily Jenne Campus, MD   25 mg at 04/30/16 0539  . spironolactone (ALDACTONE) tablet 25 mg  25 mg Oral Daily Jenne Campus, MD   25 mg at 04/30/16 7673    Lab Results:  Results for orders placed or performed during the hospital encounter of 04/28/16 (from the past 48 hour(s))  Lipid panel, fasting     Status: None   Collection Time: 04/29/16  6:17 AM  Result Value Ref Range   Cholesterol 113 0 - 200 mg/dL   Triglycerides 109 <150 mg/dL   HDL 49 >40 mg/dL   Total CHOL/HDL Ratio 2.3 RATIO   VLDL 22 0 - 40 mg/dL   LDL Cholesterol 42 0 - 99 mg/dL    Comment:        Total Cholesterol/HDL:CHD Risk Coronary Heart Disease Risk Table                     Men   Women  1/2 Average Risk   3.4   3.3  Average Risk       5.0   4.4  2 X Average Risk   9.6   7.1  3 X Average Risk  23.4   11.0        Use the calculated Patient Ratio above and the CHD Risk Table to determine the patient's CHD Risk.        ATP III CLASSIFICATION (LDL):  <100     mg/dL   Optimal  100-129  mg/dL   Near or Above                    Optimal  130-159  mg/dL   Borderline  160-189  mg/dL   High  >190     mg/dL   Very High Performed at [facility]   T4, free     Status: None   Collection Time: 04/29/16  6:27 PM  Result Value Ref Range   Free  T4 1.00 0.61 - 1.12 ng/dL    Comment: Performed at [facility]  T3, free     Status: Abnormal   Collection Time:  04/29/16  6:27 PM  Result Value Ref Range   T3, Free 5.5 (H) 2.0 - 4.4 pg/mL    Comment: (NOTE) Performed At: [facility] [address] [place], [place] [id number] Lindon Romp MD [id number] Performed at [facility]   TSH     Status: Abnormal   Collection Time: 04/30/16  6:10 AM  Result Value Ref Range   TSH 0.038 (L) 0.350 - 4.500 uIU/mL    Comment: Performed at [facility]    Blood Alcohol level:  Lab Results  Component Value Date   [facility] <5 04/28/2016   ETH <5 04/25/2016    Physical Findings: AIMS: Facial and Oral Movements Muscles of Facial Expression: None, normal Lips and Perioral Area: None, normal Jaw: None, normal Tongue: None, normal,Extremity Movements Upper (arms, wrists, hands, fingers): None, normal Lower (legs, knees, ankles, toes): None, normal, Trunk Movements Neck, shoulders, hips: None, normal, Overall Severity Severity of abnormal movements (highest score from questions above): None, normal Incapacitation due to abnormal movements: None, normal Patient's awareness of abnormal movements (rate only patient's report): No Awareness, Dental Status Current problems with teeth and/or dentures?: No Does patient usually wear dentures?: No  CIWA:  CIWA-Ar Total: 6 COWS:  COWS Total Score: 5  Musculoskeletal: Strength & Muscle Tone: within normal limits- at this time no tremors, no diaphoresis, no restlessness  Gait & Station: shuffle Patient leans: N/A  Psychiatric Specialty Exam: Physical Exam  ROS denies headache, denies chest pain, denies shortness of breath, denies vomiting   Blood pressure 133/75, pulse 108, temperature 97.4 F (36.3 C), temperature source Oral, resp. rate 24, height _0  (1.575 m), weight 135 lb 5 oz (61.377 kg), SpO2 94 %.Body mass index is 24.74 kg/(m^2).  General Appearance: Fairly Groomed  Eye Contact:  Good  Speech:  Normal Rate  Volume:  Decreased  Mood:  reports feeling  anxious, but less so than on admission   Affect:  Blunt  Thought Process:  Linear  Orientation:  Other:  alert and attentive   Thought Content:  ruminative on anxiety symptoms, denies hallucinations, no delusions , does not appear internally preoccuopied at this time   Suicidal Thoughts:  No- denies suicidal ideations , denies self injurious ideations   Homicidal Thoughts:  No denies any homicidal ideations   Memory:  recent and remote grossly intact   Judgement:  Fair  Insight:  Fair  Psychomotor Activity:  Normal- no agitation or restlessness at this time  Concentration:  Concentration: Good and Attention Span: Good  Recall:  Good  Fund of Knowledge:  Good  Language:  Good  Akathisia:  Negative  Handed:  Right  AIMS (if indicated):     Assets:  Desire for Improvement Resilience  ADL's:  Intact  Cognition:  WNL  Sleep:  Number of Hours: 6.25   Assessment - patient presented to hospital reporting severe anxiety and constant state of anxiety, panic, in spite of Valium. Agreed to gradual Valium taper, and thus far has tolerated taper well - not presenting with BZD withdrawal symptoms at this time and states anxiety slightly improved. No SI.  Little interaction with peers or participation in milieu at this time. Appetite fair . Labs- suppressed TSH, elevated FT3 - patient has history of hyperthyroidism and is on Synthroid and Cytomel- Question whether  this could be contributing to anxiety symptoms.  Treatment Plan Summary: Daily contact with patient to assess and evaluate symptoms and progress in treatment, Medication management, Plan inpatient treatment  and medications as below Encourage group and milieu participation to work on coping skills and symptom reduction Continue Valium 5 mgrs BID for anxiety- taper gradually to minimize risk of WDL Continue Valium 2 mgrs Q 12 hours PRN for anxiety if needed  Increase Zoloft to 50 mgrs QDAY for anxiety, depression  Continue Syntrhoid,  Cytomel- for history of Hypothyroidism  Request Hospitalist Consultation to help address, optimize medical / thyroid treatment if indicated   Neita Garnet, MD 04/30/2016, 4:11 PM

## 2016-04-30 NOTE — BHH Group Notes (Signed)
[place] LCSW Group Therapy  04/30/2016 / 10 AM  Type of Therapy:  Group Therapy  Participation Level:  Minimal  Participation Quality:  Resistant  Affect:  Flat  Cognitive:  Alert  Insight:  None shared  Engagement in Therapy:  None noted  Modes of Intervention:  Discussion, Exploration, Rapport Building, Socialization and Support  Summary of Progress/Problems:   Summary of Progress/Problems: The main focus of today's process group was for the patient to identify ways in which they have in the past sabotaged their own well being. Motivational Interviewing was utilized to ask the group members what they get out of their self sabotaging behavior(s), and what reasons they may have for wanting to change. Focus was primarily on isolation, relationships and self care. Patient shared that she spends most of her time in the home and misses her dog during group warm up. Did not participate when asked additional direct questions as group progressed.    Sheilah Pigeon, LCSW

## 2016-05-01 NOTE — Progress Notes (Signed)
Adult Psychoeducational Group Note  Date:  05/01/2016 Time:  8:15 PM  Group Topic/Focus:  Wrap-Up Group:   The focus of this group is to help patients review their daily goal of treatment and discuss progress on daily workbooks.  Participation Level:  Did Not Attend  Pt did not attend wrap-up group. Pt was in bed.    Lincoln Brigham 05/01/2016, 8:40 PM

## 2016-05-01 NOTE — BHH Group Notes (Signed)
[place] LCSW Group Therapy  05/01/2016 10:00 AM  Type of Therapy:  Group Therapy  Participation Level:  Did Not Attend   Christene Lye 05/01/2016, 12:35 PM

## 2016-05-01 NOTE — Progress Notes (Signed)
Patient ID: Audrey Meyer, female   DOB: September 27, 1956, 60 y.o.   MRN: [id number] [facility] MD Progress Note  05/01/2016 12:40 PM Audrey Meyer  MRN:  [id number] Subjective:   Patient denies depression, states mood is "OK". She does continue to report subjective high level of anxiety. She denies suicidal ideations . At this time denies medication side effects.  Objective : I have reviewed chart notes, and have met with patient . Patient tends to isolate in room, spending a lot of time in bed but does get up with encouragement from staff. Gait is less shuffling, improved compared to admission . Appreciate hospitalist consultation - Cytomel has been discontinued . It is felt that thyroid medications may have been contributing to anxiety symptoms.  Patient describes good sleep, fair appetite. Denies suicidal ideations , denies homicidal ideations, denies hallucinations .  Principal Problem: Major depressive disorder, recurrent episode ([place]) Diagnosis:   Patient Active Problem List   Diagnosis Date Noted  . Hyperthyroidism [E05.90] 04/30/2016  . Severe episode of recurrent major depressive disorder, without psychotic features ([place]) [F33.2]   . Major depressive disorder, recurrent episode ([place]) [F33.9] 04/28/2016  . Chronic obstructive pulmonary disease ([place]) [J44.9]   . Overdose [T50.901A] 03/07/2016  . Hypokalemia [E87.6] 03/07/2016  . Hypothyroidism [E03.9] 03/07/2016  . Schizoaffective disorder ([place]) [F25.9] 03/07/2016  . Essential hypertension [I10] 03/07/2016  . COPD exacerbation ([place]) [J44.1] 03/07/2016  . Tobacco abuse [Z72.0] 03/07/2016  . Pneumonia [J18.9] 03/06/2016  . CAP (community acquired pneumonia) [J18.9] 03/06/2016   Total Time spent with patient: 20 minutes    Past Medical History:  Past Medical History  Diagnosis Date  . Hypothyroid   . High cholesterol   . Acid reflux   . COPD (chronic obstructive pulmonary disease) ([place])   . Hypertension   . Schizoaffective  disorder   . Breast cancer [facility])     Past Surgical History  Procedure Laterality Date  . Mastectomy, partial    . Appendectomy     Family History:  Family History  Problem Relation Age of Onset  . Heart attack Father   . Cancer Other     Social History:  History  Alcohol Use No     History  Drug Use No    Social History   Social History  . Marital Status: Legally Separated    Spouse Name: N/A  . Number of Children: N/A  . Years of Education: N/A   Social History Main Topics  . Smoking status: Current Every Day Smoker -- 0.00 packs/day for 2 years    Types: Cigars  . Smokeless tobacco: None  . Alcohol Use: No  . Drug Use: No  . Sexual Activity: No   Other Topics Concern  . None   Social History Narrative   Additional Social History:    Pain Medications:  N/A Prescriptions: N/A Over the Counter: N/A History of alcohol / drug use?: No history of alcohol / drug abuse Longest period of sobriety (when/how long): N/A Negative Consequences of Use: Legal  Sleep: Good  Appetite:  Fair  Current Medications: Current Facility-Administered Medications  Medication Dose Route Frequency Provider Last Rate Last Dose  . acetaminophen (TYLENOL) tablet 650 mg  650 mg Oral Q6H PRN Encarnacion Slates, NP   650 mg at 05/01/16 0042  . albuterol (PROVENTIL HFA;VENTOLIN HFA) 108 (90 Base) MCG/ACT inhaler 2 puff  2 puff Inhalation Q6H PRN Encarnacion Slates, NP      . alum & mag hydroxide-simeth (  MAALOX/MYLANTA) 200-200-20 MG/5ML suspension 30 mL  30 mL Oral Q4H PRN Encarnacion Slates, NP      . atorvastatin (LIPITOR) tablet 20 mg  20 mg Oral QHS Encarnacion Slates, NP   20 mg at 04/30/16 2201  . diazepam (VALIUM) tablet 2 mg  2 mg Oral Q12H PRN Jenne Campus, MD   2 mg at 05/01/16 0042  . diazepam (VALIUM) tablet 5 mg  5 mg Oral BID Jenne Campus, MD   5 mg at 05/01/16 0818  . hydrALAZINE (APRESOLINE) tablet 75 mg  75 mg Oral TID Jenne Campus, MD   75 mg at 05/01/16 1142  . hydrOXYzine  (ATARAX/VISTARIL) tablet 25 mg  25 mg Oral TID PRN Jenne Campus, MD   25 mg at 04/30/16 2202  . levothyroxine (SYNTHROID, LEVOTHROID) tablet 100 mcg  100 mcg Oral Daily Encarnacion Slates, NP   100 mcg at 05/01/16 0814  . magnesium hydroxide (MILK OF MAGNESIA) suspension 30 mL  30 mL Oral Daily PRN Encarnacion Slates, NP      . ondansetron (ZOFRAN-ODT) disintegrating tablet 4 mg  4 mg Oral Q6H PRN Patrecia Pour, NP   4 mg at 04/30/16 1757  . potassium chloride (K-DUR,KLOR-CON) CR tablet 20 mEq  20 mEq Oral BID Jenne Campus, MD   20 mEq at 05/01/16 0816  . ramelteon (ROZEREM) tablet 8 mg  8 mg Oral QHS Encarnacion Slates, NP   8 mg at 04/30/16 2201  . sertraline (ZOLOFT) tablet 50 mg  50 mg Oral Daily Jenne Campus, MD   50 mg at 05/01/16 0815  . spironolactone (ALDACTONE) tablet 25 mg  25 mg Oral Daily Jenne Campus, MD   25 mg at 05/01/16 3295    Lab Results:  Results for orders placed or performed during the hospital encounter of 04/28/16 (from the past 48 hour(s))  T4, free     Status: None   Collection Time: 04/29/16  6:27 PM  Result Value Ref Range   Free T4 1.00 0.61 - 1.12 ng/dL    Comment: Performed at [facility]  T3, free     Status: Abnormal   Collection Time: 04/29/16  6:27 PM  Result Value Ref Range   T3, Free 5.5 (H) 2.0 - 4.4 pg/mL    Comment: (NOTE) Performed At: [facility] [place], [place] [id number] Lindon Romp MD [id number] Performed at [facility]   TSH     Status: Abnormal   Collection Time: 04/30/16  6:10 AM  Result Value Ref Range   TSH 0.038 (L) 0.350 - 4.500 uIU/mL    Comment: Performed at [facility]    Blood Alcohol level:  Lab Results  Component Value Date   [facility] <5 04/28/2016   ETH <5 04/25/2016    Physical Findings: AIMS: Facial and Oral Movements Muscles of Facial Expression: None, normal Lips and Perioral Area: None, normal Jaw: None, normal Tongue: None,  normal,Extremity Movements Upper (arms, wrists, hands, fingers): None, normal Lower (legs, knees, ankles, toes): None, normal, Trunk Movements Neck, shoulders, hips: None, normal, Overall Severity Severity of abnormal movements (highest score from questions above): None, normal Incapacitation due to abnormal movements: None, normal Patient's awareness of abnormal movements (rate only patient's report): No Awareness, Dental Status Current problems with teeth and/or dentures?: No Does patient usually wear dentures?: No  CIWA:  CIWA-Ar Total: 6 COWS:  COWS Total Score: 5  Musculoskeletal: Strength & Muscle Tone: within normal limits- at this time no tremors, no diaphoresis, no restlessness  Gait & Station: shuffle Patient leans: N/A  Psychiatric Specialty Exam: Physical Exam  ROS denies headache, denies chest pain, denies shortness of breath, denies vomiting , no rash  Blood pressure 145/84, pulse 114, temperature 97.6 F (36.4 C), temperature source Oral, resp. rate 15, height _0  (1.575 m), weight 135 lb 5 oz (61.377 kg), SpO2 94 %.Body mass index is 24.74 kg/(m^2).  General Appearance: Fairly Groomed  Eye Contact:  Good  Speech:  Normal Rate  Volume:  Decreased  Mood:  Denies depression at this time , reports ongoing anxiety   Affect:  Anxious, less blunted   Thought Process:  Linear  Orientation:  Other:  alert and attentive - she is fully alert, and oriented x 3   Thought Content: remains ruminative, anxious, but improved compared to admission- denies hallucinations, and does not appear internally preoccupied   Suicidal Thoughts:  No- denies suicidal ideations , denies self injurious ideations   Homicidal Thoughts:  No denies any homicidal ideations   Memory:  recent and remote grossly intact   Judgement:  Fair  Insight:  Fair  Psychomotor Activity:  Normal- no agitation or restlessness at this time  Concentration:  Concentration: Good and Attention Span: Good  Recall:  Good   Fund of Knowledge:  Good  Language:  Good  Akathisia:  Negative  Handed:  Right  AIMS (if indicated):     Assets:  Desire for Improvement Resilience  ADL's:  Intact  Cognition:  WNL  Sleep:  Number of Hours: 4.75   Assessment -  Patient tends to isolate in room, with limited group, milieu interaction, but does get out of bed/ room with staff encouragement . Denies depression, reports ongoing anxiety, but appears improved compared to admission and currently not presenting with any symptoms of acute distress or panic . She is alert, attentive, oriented x 3.  Appreciate hospitalist consult- Cytomel has been discontinued- continues Synthroid for history of hypothyroidism .  Treatment Plan Summary: Daily contact with patient to assess and evaluate symptoms and progress in treatment, Medication management, Plan inpatient treatment  and medications as below Encourage group and milieu participation to work on coping skills and symptom reduction Continue Valium 5 mgrs BID for anxiety- taper gradually to minimize risk of WDL Continue Valium 2 mgrs Q 12 hours PRN for anxiety if needed  Continue  Zoloft  50 mgrs QDAY for anxiety, depression  Continue Syntrhoid, for history of Hypothyroidism   COBOS, FERNANDO, MD 05/01/2016, 12:40 PM

## 2016-05-01 NOTE — Progress Notes (Addendum)
D:  Patient's self inventory sheet, patient has fair sleep, sleep medication is helpful.  Fair appetite, low energy level, poor concentration.  Rated depression and hopeless 6, anxiety 9.  Denied withdrawals.  Denied SI.  Denied physical problems.  Denied pain.  Goal is to eat, sleep, attend groups.  Plans to do my best.  No discharge plans. A:  Medications administered per MD orders. Emotional support and encouragement given patient. R:  Denied SI and HI, contracts for safety.  Denied A/V hallucinations. Safety maintained with 15 minute checks.

## 2016-05-01 NOTE — Plan of Care (Signed)
Problem: Education: Goal: Utilization of techniques to improve thought processes will improve Outcome: Not Progressing Nurse discussed depression/coping skills with patient.

## 2016-05-02 MED ORDER — DIAZEPAM 5 MG PO TABS
5.0000 mg | ORAL_TABLET | Freq: Every day | ORAL | Status: DC
  Administered 2016-05-03: 5 mg via ORAL
  Filled 2016-05-02: qty 1

## 2016-05-02 MED ORDER — TRAZODONE HCL 50 MG PO TABS
50.0000 mg | ORAL_TABLET | Freq: Every evening | ORAL | Status: DC | PRN
  Administered 2016-05-02 – 2016-05-03 (×2): 50 mg via ORAL
  Filled 2016-05-02 (×2): qty 1

## 2016-05-02 MED ORDER — DIAZEPAM 2 MG PO TABS: 2.0000 mg | ORAL_TABLET | Freq: Every morning | ORAL | Status: DC

## 2016-05-02 MED ORDER — DIAZEPAM 5 MG PO TABS: 5.0000 mg | ORAL_TABLET | Freq: Every day | ORAL | Status: DC

## 2016-05-02 NOTE — Plan of Care (Signed)
Problem: Education: Goal: Knowledge of the prescribed therapeutic regimen will improve Outcome: Progressing Nurse discussed coping skills, depression, anxiety, panic feelings with patient.

## 2016-05-02 NOTE — Progress Notes (Signed)
Pt has been in her bed all evening.  She reports that she has just been tired today and chose to stay in bed.  She denies pain.  She denies SI/HI/AVH.  She voices no needs or concerns at this time.  Pt seemed cautious and forwards little.  Conversation was minimal.  Pt was encouraged to make her needs known to staff.  Support and encouragement offered.  Discharge plans are in process.  Safety maintained with q15 minute checks.

## 2016-05-02 NOTE — Progress Notes (Signed)
Writer entered patients room and observed her lying in bed resting, she had reported to mht on the hall that she was having trouble resting b/c her mind was racing and she felt anxious. Writer gave her her scheduled medications which included her sleep aid. She has not been up in the dayroom but instead isolative to her room. She denies si/hi/a/v hallucinations. Support given, patient informed of prns available if needed. Safety maintained on unit with 15 min checks.

## 2016-05-02 NOTE — [facility] Group Notes (Signed)
[facility] LCSW Aftercare Discharge Planning Group Note  05/02/2016 8:45 AM  Pt did not attend, declined invitation.   Peri Maris, Post 05/02/2016 9:27 AM

## 2016-05-02 NOTE — Progress Notes (Signed)
Patient ID: Audrey Meyer, female   DOB: June 02, 1956, 60 y.o.   MRN: [id number] [facility] MD Progress Note  05/02/2016 5:03 PM TYFFANIE LEISENRING  MRN:  [id number] Subjective:  She reports ongoing subjective anxiety, which she characterizes as severe. She states anxiety makes her depressed , but denies severe depression at this time. Expresses apprehension of returning home with high levels of anxiety. At this time denies medication side effects. States her ex husband, with whom she has a good relationship, came to visit last evening, and states this helped her feel better She states she slept fairly last night, which she attributes to increased anxiety last night not letting her fall asleep. States that in the past she has tried Trazodone ( does not remember dose ) and that it was very helpful and well tolerated . She is hoping to restart Trazodone .   Objective : I have reviewed chart notes, and have met with patient . Limited group participation, tends to isolate, but does get out of bed with encouragement from staff. Gait presents steadier and less shuffling than on admission. As noted, reports ongoing anxiety symptoms, reports feeling panic attacks symptoms, but does present calmer than on admission and at this time does not exhibit any agitated, distressed, or anxious behaviors. Partially responsive to support, encouragement, reassurance, affect/anxiety tend to improve during session.' Denies any suicidal ideations   Principal Problem: Major depressive disorder, recurrent episode ([facility]) Diagnosis:   Patient Active Problem List   Diagnosis Date Noted  . Hyperthyroidism [E05.90] 04/30/2016  . Severe episode of recurrent major depressive disorder, without psychotic features ([facility]) [F33.2]   . Major depressive disorder, recurrent episode ([facility]) [F33.9] 04/28/2016  . Chronic obstructive pulmonary disease ([facility]) [J44.9]   . Overdose [T50.901A] 03/07/2016  . Hypokalemia [E87.6] 03/07/2016  .  Hypothyroidism [E03.9] 03/07/2016  . Schizoaffective disorder ([facility]) [F25.9] 03/07/2016  . Essential hypertension [I10] 03/07/2016  . COPD exacerbation ([facility]) [J44.1] 03/07/2016  . Tobacco abuse [Z72.0] 03/07/2016  . Pneumonia [J18.9] 03/06/2016  . CAP (community acquired pneumonia) [J18.9] 03/06/2016   Total Time spent with patient: 20 minutes    Past Medical History:  Past Medical History  Diagnosis Date  . Hypothyroid   . High cholesterol   . Acid reflux   . COPD (chronic obstructive pulmonary disease) ([facility])   . Hypertension   . Schizoaffective disorder   . Breast cancer [facility])     Past Surgical History  Procedure Laterality Date  . Mastectomy, partial    . Appendectomy     Family History:  Family History  Problem Relation Age of Onset  . Heart attack Father   . Cancer Other     Social History:  History  Alcohol Use No     History  Drug Use No    Social History   Social History  . Marital Status: Legally Separated    Spouse Name: N/A  . Number of Children: N/A  . Years of Education: N/A   Social History Main Topics  . Smoking status: Current Every Day Smoker -- 0.00 packs/day for 2 years    Types: Cigars  . Smokeless tobacco: None  . Alcohol Use: No  . Drug Use: No  . Sexual Activity: No   Other Topics Concern  . None   Social History Narrative   Additional Social History:    Pain Medications:  N/A Prescriptions: N/A Over the Counter: N/A History of alcohol / drug use?: No history of alcohol / drug abuse Longest  period of sobriety (when/how long): N/A Negative Consequences of Use: Legal  Sleep: Good  Appetite:  Fair  Current Medications: Current Facility-Administered Medications  Medication Dose Route Frequency Provider Last Rate Last Dose  . acetaminophen (TYLENOL) tablet 650 mg  650 mg Oral Q6H PRN Sanjuana Kava, NP   650 mg at 05/02/16 1005  . albuterol (PROVENTIL HFA;VENTOLIN HFA) 108 (90 Base) MCG/ACT inhaler 2 puff  2 puff  Inhalation Q6H PRN Sanjuana Kava, NP      . alum & mag hydroxide-simeth (MAALOX/MYLANTA) 200-200-20 MG/5ML suspension 30 mL  30 mL Oral Q4H PRN Sanjuana Kava, NP      . atorvastatin (LIPITOR) tablet 20 mg  20 mg Oral QHS Sanjuana Kava, NP   20 mg at 05/01/16 2119  . diazepam (VALIUM) tablet 2 mg  2 mg Oral Q12H PRN Craige Cotta, MD   2 mg at 05/02/16 1450  . [START ON 05/03/2016] diazepam (VALIUM) tablet 5 mg  5 mg Oral Daily Kanyon Bunn A Dace Denn, MD      . hydrALAZINE (APRESOLINE) tablet 75 mg  75 mg Oral TID Craige Cotta, MD   75 mg at 05/02/16 1149  . hydrOXYzine (ATARAX/VISTARIL) tablet 25 mg  25 mg Oral TID PRN Craige Cotta, MD   25 mg at 05/01/16 1256  . levothyroxine (SYNTHROID, LEVOTHROID) tablet 100 mcg  100 mcg Oral Daily Sanjuana Kava, NP   100 mcg at 05/02/16 0837  . magnesium hydroxide (MILK OF MAGNESIA) suspension 30 mL  30 mL Oral Daily PRN Sanjuana Kava, NP      . ondansetron (ZOFRAN-ODT) disintegrating tablet 4 mg  4 mg Oral Q6H PRN Charm Rings, NP   4 mg at 05/02/16 1456  . potassium chloride (K-DUR,KLOR-CON) CR tablet 20 mEq  20 mEq Oral BID Craige Cotta, MD   20 mEq at 05/02/16 0837  . sertraline (ZOLOFT) tablet 50 mg  50 mg Oral Daily Craige Cotta, MD   50 mg at 05/02/16 0837  . spironolactone (ALDACTONE) tablet 25 mg  25 mg Oral Daily Craige Cotta, MD   25 mg at 05/02/16 0837  . traZODone (DESYREL) tablet 50 mg  50 mg Oral QHS PRN Craige Cotta, MD        Lab Results:  No results found for this or any previous visit (from the past 48 hour(s)).  Blood Alcohol level:  Lab Results  Component Value Date   ETH <5 04/28/2016   ETH <5 04/25/2016    Physical Findings: AIMS: Facial and Oral Movements Muscles of Facial Expression: None, normal Lips and Perioral Area: None, normal Jaw: None, normal Tongue: None, normal,Extremity Movements Upper (arms, wrists, hands, fingers): None, normal Lower (legs, knees, ankles, toes): None, normal, Trunk  Movements Neck, shoulders, hips: None, normal, Overall Severity Severity of abnormal movements (highest score from questions above): None, normal Incapacitation due to abnormal movements: None, normal Patient's awareness of abnormal movements (rate only patient's report): No Awareness, Dental Status Current problems with teeth and/or dentures?: No Does patient usually wear dentures?: No  CIWA:  CIWA-Ar Total: 2 COWS:  COWS Total Score: 3  Musculoskeletal: Strength & Muscle Tone: within normal limits- at this time no tremors, no diaphoresis, no restlessness  Gait & Station: shuffle Patient leans: N/A  Psychiatric Specialty Exam: Physical Exam  ROS denies headache, denies chest pain, denies shortness of breath, denies vomiting , no rash  Blood pressure 135/82, pulse 100, temperature 98  F (36.7 C), temperature source Oral, resp. rate 16, height 5\' 2"  (1.575 m), weight 135 lb 5 oz (61.377 kg), SpO2 94 %.Body mass index is 24.74 kg/(m^2).  General Appearance: Fairly Groomed  Eye Contact:  Good  Speech:  Normal Rate  Volume:  Decreased  Mood:  Reports some depression, but improved compared to admission   Affect:  Reports ongoing anxiety,but presents calm, not in distress or overtly anxious at this time  Thought Process:  Linear  Orientation:  Other:  alert and attentive - she is fully alert, and oriented x 3   Thought Content:denies hallucinations, no delusions, and does not appear internally preoccupied   Suicidal Thoughts:  No- denies suicidal ideations , denies self injurious ideations   Homicidal Thoughts:  No denies any homicidal ideations   Memory:  recent and remote grossly intact   Judgement:  Fair  Insight:  Fair  Psychomotor Activity:  Normal- no agitation or restlessness at this time  Concentration:  Concentration: Good and Attention Span: Good  Recall:  Good  Fund of Knowledge:  Good  Language:  Good  Akathisia:  Negative  Handed:  Right  AIMS (if indicated):      Assets:  Desire for Improvement Resilience  ADL's:  Intact  Cognition:  WNL  Sleep:  Number of Hours: 6   Assessment -  Patient continues to report significant anxiety, which she characterizes as severe at times. She is presenting calm, with no overt or noticeable symptoms of anxiety. She states her mood is better, and denies any suicidal ideations. At this time wants to continue tapering off Valium, and seems motivated in treating anxiety without BZDs or potentially addictive medications . We discussed other medication options to address anxiety  Treatment Plan Summary: Daily contact with patient to assess and evaluate symptoms and progress in treatment, Medication management, Plan inpatient treatment  and medications as below Encourage group and milieu participation to work on coping skills and symptom reduction Decrease Valium to 5 mgrs QAM  for anxiety- taper gradually to minimize risk of WDL Continue Valium 2 mgrs Q 12 hours PRN for anxiety if needed  Continue  Zoloft  50 mgrs QDAY for anxiety, depression  Start Trazodone 50 mgrs QHS PRN for insomnia D/C Melatonin  Continue Syntrhoid, for history of Hypothyroidism   Jacier Gladu, Madaline Guthrie, MD 05/02/2016, 5:03 PM

## 2016-05-02 NOTE — Progress Notes (Signed)
D:  Patient's self inventory sheet, patient has poor sleep, sleep medication has not helped.  Good appetite, low energy level, good concentration.  Rated depression and hopeless 3, anxiety 2.  Denied withdrawals.  Denied SI.  Denied physical problems.  Denied pain.  Goal is to attend group.  Plans to do her best.  Does have discharge plans. A:  Medications administered per MD orders.  Emotional support and encouragement given patient. R:  Denied SI and HI, contracts for safety.  Denied A/V hallucinations.  Safety maintained with 15 minute checks. Patient stated she has difficulty sleeping because of her racing thoughts, is concerned about her health.

## 2016-05-02 NOTE — Progress Notes (Signed)
Recreation Therapy Notes  Date: 05.29.2017 Time: 9:30am Location: [address] Dayroom   Group Topic: Stress Management  Goal Area(s) Addresses:  Patient will actively participate in stress management techniques presented during session.   Behavioral Response: Did not attend.   Laureen Ochs Dalary Hollar, LRT/CTRS        Lane Hacker 05/02/2016 2:36 PM

## 2016-05-02 NOTE — Progress Notes (Signed)
Adult Psychoeducational Group Note  Date:  05/02/2016 Time:  8:20 PM  Group Topic/Focus:  Wrap-Up Group:   The focus of this group is to help patients review their daily goal of treatment and discuss progress on daily workbooks.  Participation Level:  Did Not Attend  Pt did not attend wrap-up group. Pt was asleep.    Lincoln Brigham 05/02/2016, 8:36 PM

## 2016-05-03 DIAGNOSIS — E0590 Thyrotoxicosis, unspecified without thyrotoxic crisis or storm: Secondary | ICD-10-CM

## 2016-05-03 MED ORDER — DIAZEPAM 5 MG PO TABS
5.0000 mg | ORAL_TABLET | Freq: Two times a day (BID) | ORAL | Status: DC
  Administered 2016-05-03 – 2016-05-05 (×4): 5 mg via ORAL
  Filled 2016-05-03 (×4): qty 1

## 2016-05-03 MED ORDER — HYDRALAZINE HCL 50 MG PO TABS
50.0000 mg | ORAL_TABLET | Freq: Three times a day (TID) | ORAL | Status: DC
  Administered 2016-05-03 (×2): 50 mg via ORAL
  Filled 2016-05-03 (×6): qty 1

## 2016-05-03 MED ORDER — LEVOTHYROXINE SODIUM 75 MCG PO TABS
75.0000 ug | ORAL_TABLET | Freq: Every day | ORAL | Status: DC
  Filled 2016-05-03 (×2): qty 1

## 2016-05-03 MED ORDER — SERTRALINE HCL 25 MG PO TABS
75.0000 mg | ORAL_TABLET | Freq: Every day | ORAL | Status: DC
  Administered 2016-05-04 – 2016-05-05 (×2): 75 mg via ORAL
  Filled 2016-05-03 (×4): qty 3

## 2016-05-03 MED ORDER — ATENOLOL 50 MG PO TABS: 50.0000 mg | ORAL_TABLET | Freq: Every day | ORAL | Status: DC

## 2016-05-03 MED ORDER — GABAPENTIN 100 MG PO CAPS
100.0000 mg | ORAL_CAPSULE | Freq: Three times a day (TID) | ORAL | Status: DC
  Administered 2016-05-03: 100 mg via ORAL
  Filled 2016-05-03 (×6): qty 1

## 2016-05-03 MED ORDER — ATENOLOL 100 MG PO TABS
100.0000 mg | ORAL_TABLET | Freq: Two times a day (BID) | ORAL | Status: DC
  Administered 2016-05-03: 100 mg via ORAL
  Filled 2016-05-03: qty 4
  Filled 2016-05-03 (×4): qty 1

## 2016-05-03 MED ORDER — GABAPENTIN 100 MG PO CAPS
100.0000 mg | ORAL_CAPSULE | Freq: Two times a day (BID) | ORAL | Status: DC
  Administered 2016-05-03 – 2016-05-04 (×3): 100 mg via ORAL
  Filled 2016-05-03 (×5): qty 1

## 2016-05-03 NOTE — Progress Notes (Signed)
D: Pt presents anxious this morning. Pt reported ongoing panic attacks. Pt gait noted to be slow and unsteady. Pt have mild tremors during panic attack. Pt do not appear to be in distress during episodes. Pt reported having nightmares this morning while sleeping that triggered her panic attacks. Pt rates anxiety 8/10. Pt rates depression 9/10. Pt denies suicidal thoughts. Pt fixated on taking Valium even after taking scheduled dose. Pt appears to have a difficult time using coping skills to help manage anxiety.  A: Medications administered as ordered per MD. Verbal support provided. Pt encouraged to attend groups as tolerated. 15 minute checks performed for safety. R: Pt requesting to speak to MD about worsening depression and anxiety this morning.

## 2016-05-03 NOTE — Progress Notes (Addendum)
Patient ID: Audrey Meyer, female   DOB: 10-09-1956, 60 y.o.   MRN: [id number] [facility] MD Progress Note  05/03/2016 3:22 PM Audrey Meyer  MRN:  [id number] Subjective:  She reports increased level of anxiety today. States she feels " I need to go back up on Valium dose " as lower dose resulting in increased anxiety. Denies medications side effects. Denies suicidal ideations . Of note, she is future oriented, and she states her son, who lives in [place], wants her to relocate and live with him. States she would like to do this, but thinking about it ( such as transportation, establishing care there, etc) is causing more severe anxiety  Objective : I have reviewed chart notes, and have met with patient . Today patient presents more anxious, often requesting to be seen , seeking reassurance from staff. Reports feeling " panic" symptoms, but does not present with any visible symptoms or presentation of distress, and seems to be describing subjective symptoms. We reviewed deep breathing, relaxation techniques, with some improvement . We discussed medication options, patient agrees to Neurontin trial to help address severe anxiety . Side effects reviewed . Group participation has been limited .   Principal Problem: Major depressive disorder, recurrent episode (HCC) Diagnosis:   Patient Active Problem List   Diagnosis Date Noted  . Hyperthyroidism [E05.90] 04/30/2016  . Severe episode of recurrent major depressive disorder, without psychotic features (HCC) [F33.2]   . Major depressive disorder, recurrent episode (HCC) [F33.9] 04/28/2016  . Chronic obstructive pulmonary disease (HCC) [J44.9]   . Overdose [T50.901A] 03/07/2016  . Hypokalemia [E87.6] 03/07/2016  . Hypothyroidism [E03.9] 03/07/2016  . Schizoaffective disorder (HCC) [F25.9] 03/07/2016  . Essential hypertension [I10] 03/07/2016  . COPD exacerbation (HCC) [J44.1] 03/07/2016  . Tobacco abuse [Z72.0] 03/07/2016  . Pneumonia [J18.9] 03/06/2016   . CAP (community acquired pneumonia) [J18.9] 03/06/2016   Total Time spent with patient: 20 minutes    Past Medical History:  Past Medical History  Diagnosis Date  . Hypothyroid   . High cholesterol   . Acid reflux   . COPD (chronic obstructive pulmonary disease) (HCC)   . Hypertension   . Schizoaffective disorder   . Breast cancer [facility])     Past Surgical History  Procedure Laterality Date  . Mastectomy, partial    . Appendectomy     Family History:  Family History  Problem Relation Age of Onset  . Heart attack Father   . Cancer Other     Social History:  History  Alcohol Use No     History  Drug Use No    Social History   Social History  . Marital Status: Legally Separated    Spouse Name: N/A  . Number of Children: N/A  . Years of Education: N/A   Social History Main Topics  . Smoking status: Current Every Day Smoker -- 0.00 packs/day for 2 years    Types: Cigars  . Smokeless tobacco: None  . Alcohol Use: No  . Drug Use: No  . Sexual Activity: No   Other Topics Concern  . None   Social History Narrative   Additional Social History:    Pain Medications:  N/A Prescriptions: N/A Over the Counter: N/A History of alcohol / drug use?: No history of alcohol / drug abuse Longest period of sobriety (when/how long): N/A Negative Consequences of Use: Legal  Sleep: Good  Appetite:  Poor today   Current Medications: Current Facility-Administered Medications  Medication Dose Route Frequency Provider  Last Rate Last Dose  . acetaminophen (TYLENOL) tablet 650 mg  650 mg Oral Q6H PRN Sanjuana Kava, NP   650 mg at 05/03/16 0108  . albuterol (PROVENTIL HFA;VENTOLIN HFA) 108 (90 Base) MCG/ACT inhaler 2 puff  2 puff Inhalation Q6H PRN Sanjuana Kava, NP      . alum & mag hydroxide-simeth (MAALOX/MYLANTA) 200-200-20 MG/5ML suspension 30 mL  30 mL Oral Q4H PRN Sanjuana Kava, NP      . atorvastatin (LIPITOR) tablet 20 mg  20 mg Oral QHS Sanjuana Kava, NP   20 mg  at 05/02/16 2105  . diazepam (VALIUM) tablet 2 mg  2 mg Oral Q12H PRN Craige Cotta, MD   2 mg at 05/03/16 0425  . diazepam (VALIUM) tablet 5 mg  5 mg Oral BID Rockey Situ Denean Pavon, MD      . gabapentin (NEURONTIN) capsule 100 mg  100 mg Oral TID Craige Cotta, MD   100 mg at 05/03/16 1314  . hydrALAZINE (APRESOLINE) tablet 75 mg  75 mg Oral TID Craige Cotta, MD   75 mg at 05/03/16 1155  . hydrOXYzine (ATARAX/VISTARIL) tablet 25 mg  25 mg Oral TID PRN Craige Cotta, MD   25 mg at 05/03/16 1155  . levothyroxine (SYNTHROID, LEVOTHROID) tablet 100 mcg  100 mcg Oral Daily Sanjuana Kava, NP   100 mcg at 05/03/16 0751  . magnesium hydroxide (MILK OF MAGNESIA) suspension 30 mL  30 mL Oral Daily PRN Sanjuana Kava, NP      . ondansetron (ZOFRAN-ODT) disintegrating tablet 4 mg  4 mg Oral Q6H PRN Charm Rings, NP   4 mg at 05/02/16 1456  . potassium chloride (K-DUR,KLOR-CON) CR tablet 20 mEq  20 mEq Oral BID Craige Cotta, MD   20 mEq at 05/03/16 0751  . [START ON 05/04/2016] sertraline (ZOLOFT) tablet 75 mg  75 mg Oral Daily Rockey Situ Alyzabeth Pontillo, MD      . spironolactone (ALDACTONE) tablet 25 mg  25 mg Oral Daily Craige Cotta, MD   25 mg at 05/03/16 0751  . traZODone (DESYREL) tablet 50 mg  50 mg Oral QHS PRN Craige Cotta, MD   50 mg at 05/02/16 2105    Lab Results:  No results found for this or any previous visit (from the past 48 hour(s)).  Blood Alcohol level:  Lab Results  Component Value Date   ETH <5 04/28/2016   ETH <5 04/25/2016    Physical Findings: AIMS: Facial and Oral Movements Muscles of Facial Expression: None, normal Lips and Perioral Area: None, normal Jaw: None, normal Tongue: None, normal,Extremity Movements Upper (arms, wrists, hands, fingers): None, normal Lower (legs, knees, ankles, toes): None, normal, Trunk Movements Neck, shoulders, hips: None, normal, Overall Severity Severity of abnormal movements (highest score from questions above): None,  normal Incapacitation due to abnormal movements: None, normal Patient's awareness of abnormal movements (rate only patient's report): No Awareness, Dental Status Current problems with teeth and/or dentures?: No Does patient usually wear dentures?: No  CIWA:  CIWA-Ar Total: 2 COWS:  COWS Total Score: 3  Musculoskeletal: Strength & Muscle Tone: within normal limits- at this time no tremors, no diaphoresis, no restlessness  Gait & Station: shuffle Patient leans: N/A  Psychiatric Specialty Exam: Physical Exam  ROS denies headache, denies chest pain, denies shortness of breath, denies vomiting , no rash  Blood pressure 126/93, pulse 133, temperature 97.7 F (36.5 C), temperature source Oral, resp.  rate 16, height 5\' 2"  (1.575 m), weight 135 lb 5 oz (61.377 kg), SpO2 94 %.Body mass index is 24.74 kg/(m^2).  General Appearance: Fairly Groomed  Eye Contact:  Good  Speech:  Normal Rate  Volume:  Decreased  Mood: some depression, but emphasizes anxiety as major symptom   Affect:  Increased anxiety today  Thought Process:  Linear  Orientation:  Other:  alert and attentive - she is fully alert, and oriented x 3   Thought Content:denies hallucinations, no delusions, and does not appear internally preoccupied More ruminative today  Suicidal Thoughts:  No- denies suicidal ideations , denies self injurious ideations   Homicidal Thoughts:  No denies any homicidal ideations   Memory:  recent and remote grossly intact   Judgement:  Fair  Insight:  Fair  Psychomotor Activity:  Some restlessness, pacing at times   Concentration:  Concentration: Good and Attention Span: Good  Recall:  Good  Fund of Knowledge:  Good  Language:  Good  Akathisia:  Negative  Handed:  Right  AIMS (if indicated):     Assets:  Desire for Improvement Resilience  ADL's:  Intact  Cognition:  WNL  Sleep:  Number of Hours: 6.5   Assessment - Today patient presents with increased anxiety and ruminations. Describes feeling  " panicky", but does not appear to be in any acute distress. She is tolerating medications well, but feels that her anxiety has worsened as Valium has been tapered down. Denies SI. Reportedly a son who lives out of state has offered her to live with him, which she wants to do, but may be causing /contributing to increased anxiety today.  Treatment Plan Summary: Daily contact with patient to assess and evaluate symptoms and progress in treatment, Medication management, Plan inpatient treatment  and medications as below Encourage group and milieu participation to work on coping skills and symptom reduction Increase  Valium to 5 mgrs BID   for  severe anxiety Start Neurontin 100 mgrs BID for anxiety  Continue Valium 2 mgrs Q 12 hours PRN for  Severe anxiety if needed  Increase   Zoloft  To 75  mgrs QDAY for anxiety, depression  Continue Trazodone 50 mgrs QHS PRN for insomnia Continue Syntrhoid, for history of Hypothyroidism  CSW /team working on disposition planning options  Encourage PO fluids- recheck BMP   Nehemiah Massed, MD 05/03/2016, 3:22 PM

## 2016-05-03 NOTE — Progress Notes (Addendum)
  [facility] TEAM 6  I was contacted by the patient's attending psychiatric physician.  We discussed the fact that the patient has been tachycardic, and her recent diagnosis of iatrogenic hyperthyroidism.  The psychiatrist wished to discuss the advisability of initiating a beta blocker.  He also asked that we review her current blood pressure medications and consider the need to make adjustments in these should a beta blocker be initiated.  I have reviewed the patient's chart including a previous medical admission.  It appears Aldactone was chosen because of a propensity for hypokalemia.  I will continue Aldactone.  I will decrease her dose of hydralazine to 50 mg TID.  I see in her medication list that she was previously on atenolol at 100 mg twice a day.  I will resume this medication.  Her current tachycardia could certainly be due to her medication induced hyperthyroidism, withdrawal related to not receiving her usual beta blocker, or a combination of both.  I have also decided to lower her dose of Synthroid and reschedule it so that she does not receive another dose for the next 3 days.  Her heart rate will be followed with the addition of atenolol and the psychiatry team should feel free to contact us again should her heart rate not improve or should other issues arise.  Cherene Altes, MD [facility] Office  [phone] Pager - Text Page per Amion as per below:  On-Call/Text Page:      [email or web address]      password [email or web address]  If 7PM-7AM, please contact night-coverage [email or web address] Password [facility] 05/03/2016, 4:27 PM

## 2016-05-03 NOTE — Progress Notes (Signed)
Adult Psychoeducational Group Note  Date:  05/03/2016 Time:  0830  Group Topic/Focus:  Orientation:   The focus of this group is to educate the patient on the purpose and policies of crisis stabilization and provide a format to answer questions about their admission.  The group details unit policies and expectations of patients while admitted.  Participation Level:  Did Not Attend  Participation Quality:    Affect:    Cognitive:    Insight:   Engagement in Group:    Modes of Intervention:    Additional Comments:    Kordell Jafri L 05/03/2016, 1:02 PM

## 2016-05-03 NOTE — BHH Group Notes (Signed)
[facility] LCSW Group Therapy 05/03/2016 1:15 PM  Type of Therapy: Group Therapy- Feelings about Diagnosis  Pt did not attend, declined invitation.   Peri Maris, [place] 05/03/2016 3:33 PM

## 2016-05-03 NOTE — Progress Notes (Signed)
Patient ID: Audrey Meyer, female   DOB: 04-08-1956, 60 y.o.   MRN: [id number] PER STATE REGULATIONS 482.30  THIS CHART WAS REVIEWED FOR MEDICAL NECESSITY WITH RESPECT TO THE PATIENT'S ADMISSION/ DURATION OF STAY.  NEXT REVIEW DATE:   05/06/2016  Chauncy Lean, RN, BSN CASE MANAGER'

## 2016-05-03 NOTE — Progress Notes (Signed)
Pt attended spiritual care group on grief and loss facilitated by chaplain Philo Kurtz   Group opened with brief discussion and psycho-social ed around grief and loss in relationships and in relation to self - identifying life patterns, circumstances, changes that cause losses. Established group norm of speaking from own life experience. Group goal of establishing open and affirming space for members to share loss and experience with grief, normalize grief experience and provide psycho social education and grief support.     

## 2016-05-03 NOTE — Progress Notes (Signed)
[facility] Group Notes:  (Nursing/MHT/Case Management/Adjunct)  Date:  05/03/2016  Time:  2100 Type of Therapy:  wrap up group  Participation Level:  Active  Participation Quality:  Appropriate, Attentive, Sharing and Supportive  Affect:  Appropriate  Cognitive:  Appropriate  Insight:  Lacking  Engagement in Group:  Engaged  Modes of Intervention:  Clarification, Education and Support  Summary of Progress/Problems: Pt shared that she started her day with terrible anxiety and panic but felt better after the doctor adjusted her medication.  Pt was asked what helps her at home when she is dealing with anxiety and pt reported her yorkie dog Chloe.   Shellia Cleverly 05/03/2016, 9:37 PM

## 2016-05-04 LAB — BASIC METABOLIC PANEL
Anion gap: 10 (ref 5–15)
BUN: 13 mg/dL (ref 6–20)
CALCIUM: 9.4 mg/dL (ref 8.9–10.3)
CHLORIDE: 96 mmol/L — AB (ref 101–111)
CO2: 24 mmol/L (ref 22–32)
CREATININE: 0.86 mg/dL (ref 0.44–1.00)
GFR calc non Af Amer: >60 mL/min (ref >60)
Glucose, Bld: 100 mg/dL — ABNORMAL HIGH (ref 65–99)
Potassium: 4.2 mmol/L (ref 3.5–5.1)
SODIUM: 130 mmol/L — AB (ref 135–145)

## 2016-05-04 LAB — CBC WITH DIFFERENTIAL/PLATELET
BASOS PCT: 0 %
Basophils Absolute: 0 10*3/uL (ref 0.0–0.1)
EOS ABS: 0.2 10*3/uL (ref 0.0–0.7)
Eosinophils Relative: 2 %
HCT: 39.1 % (ref 36.0–46.0)
HEMOGLOBIN: 14 g/dL (ref 12.0–15.0)
Lymphocytes Relative: 21 %
Lymphs Abs: 2.2 10*3/uL (ref 0.7–4.0)
MCH: 31.3 pg (ref 26.0–34.0)
MCHC: 35.8 g/dL (ref 30.0–36.0)
MCV: 87.5 fL (ref 78.0–100.0)
Monocytes Absolute: 1.3 10*3/uL — ABNORMAL HIGH (ref 0.1–1.0)
Monocytes Relative: 12 %
NEUTROS PCT: 65 %
Neutro Abs: 6.8 10*3/uL (ref 1.7–7.7)
Platelets: 331 10*3/uL (ref 150–400)
RBC: 4.47 MIL/uL (ref 3.87–5.11)
RDW: 13.2 % (ref 11.5–15.5)
WBC: 10.4 10*3/uL (ref 4.0–10.5)

## 2016-05-04 MED ORDER — TRAZODONE HCL 150 MG PO TABS
75.0000 mg | ORAL_TABLET | Freq: Every evening | ORAL | Status: DC | PRN
  Administered 2016-05-04: 75 mg via ORAL
  Filled 2016-05-04: qty 1

## 2016-05-04 MED ORDER — ATENOLOL 50 MG PO TABS
50.0000 mg | ORAL_TABLET | Freq: Two times a day (BID) | ORAL | Status: DC
  Administered 2016-05-04 – 2016-05-05 (×2): 50 mg via ORAL
  Filled 2016-05-04 (×6): qty 1

## 2016-05-04 MED ORDER — GABAPENTIN 100 MG PO CAPS
100.0000 mg | ORAL_CAPSULE | Freq: Three times a day (TID) | ORAL | Status: DC
  Administered 2016-05-05 (×2): 100 mg via ORAL
  Filled 2016-05-04 (×7): qty 1

## 2016-05-04 NOTE — BHH Group Notes (Signed)
[facility] LCSW Aftercare Discharge Planning Group Note  05/04/2016 8:45 AM  Participation Quality: Alert, Appropriate and Oriented  Mood/Affect: Appropriate  Depression Rating: 0  Anxiety Rating: 0  Thoughts of Suicide: Pt denies SI/HI  Will you contract for safety? Yes  Current AVH: Pt denies  Plan for Discharge/Comments: Pt attended discharge planning group and actively participated in group. CSW discussed suicide prevention education with the group and encouraged them to discuss discharge planning and any relevant barriers. Pt reports improved mood and denies depression or anxiety. Pt plans to discharge to her home and follow-up with [facility].  Transportation Means: Pt reports access to transportation  Supports: No supports mentioned at this time  Peri Maris, [place] 05/04/2016 9:21 AM

## 2016-05-04 NOTE — Progress Notes (Signed)
Pt has been up more and in the dayroom.  She still seems withdrawn and anxious.  Very little interaction with peers was observed.  Pt denies SI/HI/AVH at this time.  She told Probation officer of the med changes that the MD had made today.  Pt makes her needs known to staff.  Support and encouragement offered.  Discharge plans are in process.  Safety maintained with q15 minute checks.

## 2016-05-04 NOTE — Plan of Care (Signed)
Problem: Self-Concept: Goal: Level of anxiety will decrease Outcome: Progressing Patient less anxious today, rates her anxiety at a 0/10.   Problem: Coping: Goal: Ability to cope will improve Outcome: Progressing Patient less anxious, employing coping skills.

## 2016-05-04 NOTE — Progress Notes (Signed)
Adult Psychoeducational Group Note  Date:  05/04/2016 Time:  9:31 PM  Group Topic/Focus:  Wrap-Up Group:   The focus of this group is to help patients review their daily goal of treatment and discuss progress on daily workbooks.  Participation Level:  Did Not Attend  Participation Quality:  Did not attend  Affect:  Did not attend  Cognitive:  Did not attend  Insight: None  Engagement in Group:  Did not attend  Modes of Intervention:  Did not attend  Additional Comments:  Patient did not attend wrap up group tonight.   Kong Packett L Abdulkadir Emmanuel 05/04/2016, 9:31 PM

## 2016-05-04 NOTE — Tx Team (Signed)
Interdisciplinary Treatment Plan Update (Adult) Date: 05/04/2016   Date: 05/04/2016 10:55 AM  Progress in Treatment:  Attending groups: Minimally  Participating in groups: No Taking medication as prescribed: Yes  Tolerating medication: Yes  Family/Significant othe contact made: No, CSW attempting to make contact with husband Patient understands diagnosis: Yes AEB seeking help with anxiety Discussing patient identified problems/goals with staff: Yes  Medical problems stabilized or resolved: Yes  Denies suicidal/homicidal ideation: Yes Patient has not harmed self or Others: Yes   New problem(s) identified: None identified at this time.   Discharge Plan or Barriers: Pt will return home and follow-up at [facility]  Additional comments:  Patient and CSW reviewed pt's identified goals and treatment plan. Patient verbalized understanding and agreed to treatment plan. CSW reviewed [facility] "Discharge Process and Patient Involvement" Form. Pt verbalized understanding of information provided and signed form.   Reason for Continuation of Hospitalization:  Anxiety Medication stabilization   Estimated length of stay: 1-2 days  Review of initial/current patient goals per problem list:   1.  Goal(s): Patient will participate in aftercare plan  Met:  Yes  Target date: 3-5 days from date of admission   As evidenced by: Patient will participate within aftercare plan AEB aftercare provider and housing plan at discharge being identified.   04/29/16: Pt will return home and follow-up at [facility]  3.  Goal(s): Patient will demonstrate decreased signs and symptoms of anxiety.  Met:  Yes  Target date: 3-5 days from date of admission   As evidenced by: Patient will utilize self rating of anxiety at 3 or below and demonstrated decreased signs of anxiety, or be deemed stable for discharge by MD 04/29/16: Pt was admitted with increased levels of anxiety and is currently rating those symptoms highly. Pt will  demonstrated decreased symptoms of anxiety and rate it at 3/10 prior to d/c.  05/04/16: Pt rates depression at 0/10; denies SI  Attendees:  Patient:    Family:    Physician: Dr. Parke Poisson, MD  05/04/2016 10:55 AM  Nursing: Lars Pinks, RN Case manager  05/04/2016 10:55 AM  Clinical Social Worker Peri Maris, [place] 05/04/2016 10:55 AM  Other: Tilden Fossa, LCSWA 05/04/2016 10:55 AM  Clinical: Grayland Ormond RN; Jinny Sanders, RN 05/04/2016 10:55 AM  Other: , RN Charge Nurse 05/04/2016 10:55 AM  Other:    Peri Maris, [place] Work [phone]

## 2016-05-04 NOTE — BHH Group Notes (Signed)
[facility] LCSW Group Therapy 05/04/2016 1:15 PM  Type of Therapy: Group Therapy- Emotion Regulation  Participation Level: Minimal  Participation Quality:  Reserved  Affect: Appropriate  Cognitive: Alert and Oriented   Insight:  Unable to assess  Engagement in Therapy: Developing/Improving and Engaged   Modes of Intervention: Clarification, Confrontation, Discussion, Education, Exploration, Limit-setting, Orientation, Problem-solving, Rapport Building, Art therapist, Socialization and Support  Summary of Progress/Problems: The topic for group today was emotional regulation. This group focused on both positive and negative emotion identification and allowed group members to process ways to identify feelings, regulate negative emotions, and find healthy ways to manage internal/external emotions. Group members were asked to reflect on a time when their reaction to an emotion led to a negative outcome and explored how alternative responses using emotion regulation would have benefited them. Group members were also asked to discuss a time when emotion regulation was utilized when a negative emotion was experienced. Pt did not participate verbally in group discussion however was attentive.   Peri Maris, [place] 05/04/2016 3:58 PM

## 2016-05-04 NOTE — Progress Notes (Signed)
Patient reports improvement today. Less anxious, smiling. Rating her depression, hopelessness and anxiety all at a 0/10. States sleep was poor, appetite fair, energy level normal and concentration is good. States her goal is to "make groups." Denies pain, physical problems. Medicated per orders. No prn's requested or required. Emotional support offered and self inventory reviewed. Moderate fall precautions in place and teaching reviewed. Patient verbalizes understanding. She denies SI/HI and remains safe on level III obs.

## 2016-05-04 NOTE — Progress Notes (Signed)
Recreation Therapy Notes  Date: 05.31.2017 Time: 9:30am Location: [address] Group Room   Group Topic: Stress Management  Goal Area(s) Addresses:  Patient will actively participate in stress management techniques presented during session.   Behavioral Response: Did not attend.   Laureen Ochs Desani Sprung, LRT/CTRS        Isahia Hollerbach L 05/04/2016 2:06 PM

## 2016-05-04 NOTE — Progress Notes (Addendum)
Patient ID: SHIVONNE SCHWARTZMAN, female   DOB: 08-Sep-1956, 60 y.o.   MRN: [id number] [facility] MD Progress Note  05/04/2016 4:56 PM MARIN WISNER  MRN:  [id number] Subjective:  She reports that today she has been feeling better than before, with less severe anxiety, no significant depression, and generally reports feeling better. She does express apprehension that anxiety symptoms are only transiently improved and that they will again worsen. She remains apprehensive about discharge but more focused on tangible disposition planning and future oriented .  She denies suicidal ideations . At this time she does not endorse medication side effects. Complains of insomnia , with only partial improvement of sleep on Trazodone, states she has been on Trazodone up to 300 mgrs QHS during past treatments.Denies side effects.  Objective : I have reviewed chart notes, and have met with patient . Patient presents partially improved compared to yesterday. Yesterday she had presented with increased anxiety, leading to frequently seeking staff for reassurance and support. Today seems less anxious, less needy, and presents with a fuller range of affect, smiles at times appropriately. As reviewed with CSW, patient has been future oriented, and has been able to review disposition plans - she stated she may decide to relocate to [place] with her son, but that if this happens it will be later this year, and not an immediate decision. For now, intends to return home on discharge. Denies medication side effects at present time ( recently started on Neurontin, and Klonopin dose was increased due to the severity of her anxiety yesterday). Visible on the unit, milieu. Of note, reports improved appetite, and states she ate much better today. Labs - BMP remarkable for hyponatremia ( 130), CBC unremarkable  (* as noted, patient reported fair appetite, limited PO intake over recent days, but much improved today. Will recheck Na+ in AM)     Principal Problem: Major depressive disorder, recurrent episode ([place]) Diagnosis:   Patient Active Problem List   Diagnosis Date Noted  . Hyperthyroidism [E05.90] 04/30/2016  . Severe episode of recurrent major depressive disorder, without psychotic features ([place]) [F33.2]   . Major depressive disorder, recurrent episode ([place]) [F33.9] 04/28/2016  . Chronic obstructive pulmonary disease ([place]) [J44.9]   . Overdose [T50.901A] 03/07/2016  . Hypokalemia [E87.6] 03/07/2016  . Hypothyroidism [E03.9] 03/07/2016  . Schizoaffective disorder ([place]) [F25.9] 03/07/2016  . Essential hypertension [I10] 03/07/2016  . COPD exacerbation ([place]) [J44.1] 03/07/2016  . Tobacco abuse [Z72.0] 03/07/2016  . Pneumonia [J18.9] 03/06/2016  . CAP (community acquired pneumonia) [J18.9] 03/06/2016   Total Time spent with patient: 20 minutes    Past Medical History:  Past Medical History  Diagnosis Date  . Hypothyroid   . High cholesterol   . Acid reflux   . COPD (chronic obstructive pulmonary disease) ([place])   . Hypertension   . Schizoaffective disorder   . Breast cancer [facility])     Past Surgical History  Procedure Laterality Date  . Mastectomy, partial    . Appendectomy     Family History:  Family History  Problem Relation Age of Onset  . Heart attack Father   . Cancer Other     Social History:  History  Alcohol Use No     History  Drug Use No    Social History   Social History  . Marital Status: Legally Separated    Spouse Name: N/A  . Number of Children: N/A  . Years of Education: N/A   Social History Main Topics  .  Smoking status: Current Every Day Smoker -- 0.00 packs/day for 2 years    Types: Cigars  . Smokeless tobacco: None  . Alcohol Use: No  . Drug Use: No  . Sexual Activity: No   Other Topics Concern  . None   Social History Narrative   Additional Social History:    Pain Medications:  N/A Prescriptions: N/A Over the Counter: N/A History of alcohol / drug use?:  No history of alcohol / drug abuse Longest period of sobriety (when/how long): N/A Negative Consequences of Use: Legal  Sleep:  fair  Appetite:  Good, improved   Current Medications: Current Facility-Administered Medications  Medication Dose Route Frequency Provider Last Rate Last Dose  . acetaminophen (TYLENOL) tablet 650 mg  650 mg Oral Q6H PRN Encarnacion Slates, NP   650 mg at 05/03/16 0108  . albuterol (PROVENTIL HFA;VENTOLIN HFA) 108 (90 Base) MCG/ACT inhaler 2 puff  2 puff Inhalation Q6H PRN Encarnacion Slates, NP      . alum & mag hydroxide-simeth (MAALOX/MYLANTA) 200-200-20 MG/5ML suspension 30 mL  30 mL Oral Q4H PRN Encarnacion Slates, NP      . atenolol (TENORMIN) tablet 50 mg  50 mg Oral BID Cherene Altes, MD      . atorvastatin (LIPITOR) tablet 20 mg  20 mg Oral QHS Encarnacion Slates, NP   20 mg at 05/03/16 2140  . diazepam (VALIUM) tablet 2 mg  2 mg Oral Q12H PRN Jenne Campus, MD   2 mg at 05/03/16 0425  . diazepam (VALIUM) tablet 5 mg  5 mg Oral BID Jenne Campus, MD   5 mg at 05/04/16 0804  . gabapentin (NEURONTIN) capsule 100 mg  100 mg Oral BID Jenne Campus, MD   100 mg at 05/04/16 0804  . hydrOXYzine (ATARAX/VISTARIL) tablet 25 mg  25 mg Oral TID PRN Jenne Campus, MD   25 mg at 05/03/16 1155  . [START ON 05/06/2016] levothyroxine (SYNTHROID, LEVOTHROID) tablet 75 mcg  75 mcg Oral Daily Cherene Altes, MD      . magnesium hydroxide (MILK OF MAGNESIA) suspension 30 mL  30 mL Oral Daily PRN Encarnacion Slates, NP      . ondansetron (ZOFRAN-ODT) disintegrating tablet 4 mg  4 mg Oral Q6H PRN Patrecia Pour, NP   4 mg at 05/02/16 1456  . potassium chloride (K-DUR,KLOR-CON) CR tablet 20 mEq  20 mEq Oral BID Jenne Campus, MD   20 mEq at 05/04/16 0804  . sertraline (ZOLOFT) tablet 75 mg  75 mg Oral Daily Jenne Campus, MD   75 mg at 05/04/16 0804  . spironolactone (ALDACTONE) tablet 25 mg  25 mg Oral Daily Jenne Campus, MD   25 mg at 05/04/16 0804  . traZODone (DESYREL)  tablet 75 mg  75 mg Oral QHS PRN Jenne Campus, MD        Lab Results:  Results for orders placed or performed during the hospital encounter of 04/28/16 (from the past 48 hour(s))  Basic metabolic panel     Status: Abnormal   Collection Time: 05/04/16  6:21 AM  Result Value Ref Range   Sodium 130 (L) 135 - 145 mmol/L   Potassium 4.2 3.5 - 5.1 mmol/L   Chloride 96 (L) 101 - 111 mmol/L   CO2 24 22 - 32 mmol/L   Glucose, Bld 100 (H) 65 - 99 mg/dL   BUN 13 6 - 20 mg/dL  Creatinine, Ser 0.86 0.44 - 1.00 mg/dL   Calcium 9.4 8.9 - 10.3 mg/dL   GFR calc non Af Amer >60 >60 mL/min   GFR calc Af Amer >60 >60 mL/min    Comment: (NOTE) The eGFR has been calculated using the CKD EPI equation. This calculation has not been validated in all clinical situations. eGFR's persistently <60 mL/min signify possible Chronic Kidney Disease.    Anion gap 10 5 - 15    Comment: Performed at [facility]  CBC with Differential/Platelet     Status: Abnormal   Collection Time: 05/04/16  6:21 AM  Result Value Ref Range   WBC 10.4 4.0 - 10.5 K/uL   RBC 4.47 3.87 - 5.11 MIL/uL   Hemoglobin 14.0 12.0 - 15.0 g/dL   HCT 39.1 36.0 - 46.0 %   MCV 87.5 78.0 - 100.0 fL   MCH 31.3 26.0 - 34.0 pg   MCHC 35.8 30.0 - 36.0 g/dL   RDW 13.2 11.5 - 15.5 %   Platelets 331 150 - 400 K/uL   Neutrophils Relative % 65 %   Neutro Abs 6.8 1.7 - 7.7 K/uL   Lymphocytes Relative 21 %   Lymphs Abs 2.2 0.7 - 4.0 K/uL   Monocytes Relative 12 %   Monocytes Absolute 1.3 (H) 0.1 - 1.0 K/uL   Eosinophils Relative 2 %   Eosinophils Absolute 0.2 0.0 - 0.7 K/uL   Basophils Relative 0 %   Basophils Absolute 0.0 0.0 - 0.1 K/uL    Comment: Performed at [facility]    Blood Alcohol level:  Lab Results  Component Value Date   [facility] <5 04/28/2016   ETH <5 04/25/2016    Physical Findings: AIMS: Facial and Oral Movements Muscles of Facial Expression: None, normal Lips and Perioral Area: None,  normal Jaw: None, normal Tongue: None, normal,Extremity Movements Upper (arms, wrists, hands, fingers): None, normal Lower (legs, knees, ankles, toes): None, normal, Trunk Movements Neck, shoulders, hips: None, normal, Overall Severity Severity of abnormal movements (highest score from questions above): None, normal Incapacitation due to abnormal movements: None, normal Patient's awareness of abnormal movements (rate only patient's report): No Awareness, Dental Status Current problems with teeth and/or dentures?: No Does patient usually wear dentures?: No  CIWA:  CIWA-Ar Total: 2 COWS:  COWS Total Score: 3  Musculoskeletal: Strength & Muscle Tone: within normal limits- at this time no tremors, no diaphoresis, no restlessness  Gait & Station: shuffle Patient leans: N/A  Psychiatric Specialty Exam: Physical Exam  ROS denies headache, denies chest pain, denies shortness of breath, denies vomiting , no rash  Blood pressure 105/57, pulse 66, temperature 98.2 F (36.8 C), temperature source Oral, resp. rate 16, height 5' 2"  (1.575 m), weight 135 lb 5 oz (61.377 kg), SpO2 94 %.Body mass index is 24.74 kg/(m^2).  General Appearance: improved grooming   Eye Contact:  Good  Speech:  Normal Rate  Volume:  Improving   Mood: improved mood today, less depressed, less severely anxious    Affect:fuller in range, less anxious   Thought Process:  Linear  Orientation:  Other:  alert and attentive - she is fully alert, and oriented x 3   Thought Content:denies hallucinations, no delusions, and does not appear internally preoccupied   Suicidal Thoughts:  No- denies suicidal ideations , denies self injurious ideations   Homicidal Thoughts:  No denies any homicidal ideations   Memory:  recent and remote grossly intact   Judgement:  Fair  Insight:  Fair  Psychomotor Activity:  Some restlessness, pacing at times   Concentration:  Concentration: Good and Attention Span: Good  Recall:  Good  Fund of  Knowledge:  Good  Language:  Good  Akathisia:  Negative  Handed:  Right  AIMS (if indicated):     Assets:  Desire for Improvement Resilience  ADL's:  Intact  Cognition:  WNL  Sleep:  Number of Hours: 4.5   Assessment -  Patient reports partial but noticeable improvement of mood and anxiety symptoms over the last day, and does present with an improved range of affect. She is tolerating medications well , denies side effects. Sleep is fair, in spite of current Trazodone dose ( 50 mgrs QHS) . Thus far tolerating Zoloft titration and Neurontin trials well . Labs- mild hyponatremia, which may have been associated with limited PO intake- appetite now described as improved.  No associated symptoms noted or reported . Treatment Plan Summary: Daily contact with patient to assess and evaluate symptoms and progress in treatment, Medication management, Plan inpatient treatment  and medications as below Encourage group and milieu participation to work on coping skills and symptom reduction Continue  Valium  5 mgrs BID   for  severe anxiety Increase Neurontin  To 100 mgrs TID for anxiety  Continue Valium 2 mgrs Q 12 hours PRN for  Severe anxiety if needed  Continue   Zoloft  75  mgrs QDAY for anxiety, depression  Increase Trazodone to 75  mgrs QHS PRN for insomnia Continue Syntrhoid, for history of Hypothyroidism  CSW /team working on disposition planning options  Check BMP in AM    Neita Garnet, MD 05/04/2016, 4:56 PM

## 2016-05-05 LAB — BASIC METABOLIC PANEL
Anion gap: 7 (ref 5–15)
BUN: 13 mg/dL (ref 6–20)
CHLORIDE: 98 mmol/L — AB (ref 101–111)
CO2: 27 mmol/L (ref 22–32)
CREATININE: 0.87 mg/dL (ref 0.44–1.00)
Calcium: 9.7 mg/dL (ref 8.9–10.3)
GFR calc Af Amer: >60 mL/min (ref >60)
GFR calc non Af Amer: >60 mL/min (ref >60)
Glucose, Bld: 101 mg/dL — ABNORMAL HIGH (ref 65–99)
Potassium: 4.2 mmol/L (ref 3.5–5.1)
Sodium: 132 mmol/L — ABNORMAL LOW (ref 135–145)

## 2016-05-05 MED ORDER — GABAPENTIN 100 MG PO CAPS: 100.0000 mg | ORAL_CAPSULE | Freq: Three times a day (TID) | ORAL | Status: DC

## 2016-05-05 MED ORDER — POTASSIUM CHLORIDE CRYS ER 20 MEQ PO TBCR: 20.0000 meq | EXTENDED_RELEASE_TABLET | Freq: Two times a day (BID) | ORAL | Status: DC

## 2016-05-05 MED ORDER — DIAZEPAM 2 MG PO TABS: 2.0000 mg | ORAL_TABLET | Freq: Two times a day (BID) | ORAL | Status: DC | PRN

## 2016-05-05 MED ORDER — HYDRALAZINE HCL 50 MG PO TABS: 75.0000 mg | ORAL_TABLET | Freq: Three times a day (TID) | ORAL | Status: DC

## 2016-05-05 MED ORDER — ATORVASTATIN CALCIUM 20 MG PO TABS: 20.0000 mg | ORAL_TABLET | Freq: Every day | ORAL | Status: AC

## 2016-05-05 MED ORDER — HYDROXYZINE PAMOATE 25 MG PO CAPS: 25.0000 mg | ORAL_CAPSULE | Freq: Three times a day (TID) | ORAL | Status: DC | PRN

## 2016-05-05 MED ORDER — TRAZODONE HCL 150 MG PO TABS: 75.0000 mg | ORAL_TABLET | Freq: Every evening | ORAL | Status: DC | PRN

## 2016-05-05 MED ORDER — ATENOLOL 50 MG PO TABS: 50.0000 mg | ORAL_TABLET | Freq: Two times a day (BID) | ORAL | Status: DC

## 2016-05-05 MED ORDER — SPIRONOLACTONE 25 MG PO TABS: 25.0000 mg | ORAL_TABLET | Freq: Every day | ORAL | Status: DC

## 2016-05-05 MED ORDER — SERTRALINE HCL 25 MG PO TABS: 75.0000 mg | ORAL_TABLET | Freq: Every day | ORAL | Status: DC

## 2016-05-05 MED ORDER — LEVOTHYROXINE SODIUM 75 MCG PO TABS: 75.0000 ug | ORAL_TABLET | Freq: Every day | ORAL | Status: DC

## 2016-05-05 MED ORDER — DIAZEPAM 5 MG PO TABS: 5.0000 mg | ORAL_TABLET | Freq: Two times a day (BID) | ORAL | Status: DC

## 2016-05-05 NOTE — Progress Notes (Signed)
Patient ID: Audrey Meyer, female   DOB: Jun 14, 1956, 60 y.o.   MRN: [id number]   Pt currently presents with an appropriate affect and pleasant behavior. Per self inventory, pt rates depression at a 3, hopelessness 2 and anxiety 0. Pt's daily goal is to "groups-sleep" and they intend to do so by "my best." Pt reports good sleep, a good appetite, low energy and good concentration.   Pt provided with medications per providers orders. Pt's labs and vitals were monitored throughout the day. Pt supported emotionally and encouraged to express concerns and questions. Pt educated on medications and suicide prevention resources.   Pt's safety ensured with 15 minute and environmental checks. Pt currently denies SI/HI and A/V hallucinations. Pt verbally agrees to seek staff if SI/HI or A/VH occurs and to consult with staff before acting on any harmful thoughts. Pt to be discharged today. Will continue POC.

## 2016-05-05 NOTE — Progress Notes (Signed)
Patient ID: Audrey Meyer, female   DOB: 1956-11-14, 60 y.o.   MRN: [id number]   Pt discharged to lobby. Pt was stable and appreciative at that time. All papers and prescriptions were given and valuables returned. Verbal understanding expressed. Denies SI/HI and A/VH. Pt given opportunity to express concerns and ask questions.

## 2016-05-05 NOTE — Plan of Care (Signed)
Problem: Medication: Goal: Ability to use medications correctly will improve Outcome: Progressing Pt rated anxiety as 0 on a 0-10 scale. Pt out in dayroom watching TV

## 2016-05-05 NOTE — Progress Notes (Signed)
Patient ID: Audrey Meyer, female   DOB: 10-Dec-1955, 60 y.o.   MRN: [id number] D: Patient in dayroom sitting quietly watching TV and not interacting with peers. Pt reports doing well, rated anxiety as 0 on a 0-10 scale. Pt reports goal is to continue same medication regime after discharge. Denies SI/HI/AVH and pain.No behavioral issues noted.  A: Support and encouragement offered as needed. Medications administered as prescribed.  R: Patient safe and cooperative on unit. Will continue to monitor patient for safety and stability.

## 2016-05-05 NOTE — Discharge Summary (Signed)
Physician Discharge Summary Note  Patient:  Audrey Meyer is an 60 y.o., female MRN:  [id number] DOB:  06-06-1956 Patient phone:  [phone] (home)  Patient address:   [place] [address],  Total Time spent with patient: 45 minutes  Date of Admission:  04/28/2016 Date of Discharge: 05/05/2016   Reason for Admission:    60 year old female . Separated. Lives alone . On Disability. Patient reports long history of anxiety, which she describes as panic attacks. States her anxiety has been getting worse,and states " I am in a constat panic attack". She also describes depression, but emphasized anxiety as major symptom.  Describes neuro-vegetative symptoms of depression as below, but denies any suicidal ideations at this time . Also denies any psychotic symptoms. She had called EMS " because I was so anxious", came to ED of own accord. States she had a recent admission in [facility]/ [place] for similar circumstances.  Of note, patient states she has been on Valium " for many years ", at home was taking " one a day " ( 5 mgrs ) but recently has increased doses due to anxiety. As per nursing staff, she has been focused on Valium dosing/ frequency .  Of note, patient describes being in a state of panic at this time, but presents in no acute distress, No tremors, no diaphoresis, no restlessness, vitals stable .   Principal Problem: Major depressive disorder, recurrent episode [facility]) Discharge Diagnoses: Patient Active Problem List   Diagnosis Date Noted  . Hyperthyroidism [E05.90] 04/30/2016  . Severe episode of recurrent major depressive disorder, without psychotic features ([place]) [F33.2]   . Major depressive disorder, recurrent episode ([place]) [F33.9] 04/28/2016  . Chronic obstructive pulmonary disease ([place]) [J44.9]   . Overdose [T50.901A] 03/07/2016  . Hypokalemia [E87.6] 03/07/2016  . Hypothyroidism [E03.9] 03/07/2016  . Schizoaffective disorder ([place]) [F25.9]  03/07/2016  . Essential hypertension [I10] 03/07/2016  . COPD exacerbation ([place]) [J44.1] 03/07/2016  . Tobacco abuse [Z72.0] 03/07/2016  . Pneumonia [J18.9] 03/06/2016  . CAP (community acquired pneumonia) [J18.9] 03/06/2016     Past Medical History:  Past Medical History  Diagnosis Date  . Hypothyroid   . High cholesterol   . Acid reflux   . COPD (chronic obstructive pulmonary disease) ([place])   . Hypertension   . Schizoaffective disorder   . Breast cancer [facility])     Past Surgical History  Procedure Laterality Date  . Mastectomy, partial    . Appendectomy     Family History:  Family History  Problem Relation Age of Onset  . Heart attack Father   . Cancer Other    Social History:  History  Alcohol Use No     History  Drug Use No    Social History   Social History  . Marital Status: Legally Separated    Spouse Name: N/A  . Number of Children: N/A  . Years of Education: N/A   Social History Main Topics  . Smoking status: Current Every Day Smoker -- 0.00 packs/day for 2 years    Types: Cigars  . Smokeless tobacco: None  . Alcohol Use: No  . Drug Use: No  . Sexual Activity: No   Other Topics Concern  . None   Social History Narrative    Hospital Course:   Renato Gails was admitted for Major depressive disorder, recurrent episode ([place]) , with psychosis and crisis management.  Pt was treated discharged with the medications listed below  under Medication List.  Medical problems were identified and treated as needed.  Home medications were restarted as appropriate.  Improvement was monitored by observation and Renato Gails 's daily report of symptom reduction.  Emotional and mental status was monitored by daily self-inventory reports completed by Renato Gails and clinical staff.         ANNABELLE ALLCORN was evaluated by the treatment team for stability and plans for continued recovery upon discharge. LADAN HROMADKA 's motivation was an integral factor for  scheduling further treatment. Employment, transportation, bed availability, health status, family support, and any pending legal issues were also considered during hospital stay. Pt was offered further treatment options upon discharge including but not limited to Residential, Intensive Outpatient, and Outpatient treatment.  ABIGAHIL SINHA will follow up with the services as listed below under Follow Up Information.     Upon completion of this admission the patient was both mentally and medically stable for discharge denying suicidal/homicidal ideation, auditory/visual/tactile hallucinations, delusional thoughts and paranoia.     Laurence Spates Faulkner responded well to treatment with Valium, Vistaril, Zoloft, Trazodone, and Gabapentin without adverse effects. Pt demonstrated improvement without reported or observed adverse effects to the point of stability appropriate for outpatient management. Pertinent labs include: Na+ 132, Chloride 98 , UDS + benzo (yet prescribed) Reviewed CBC, CMP, BAL, and UDS; all unremarkable aside from noted exceptions.   Physical Findings: AIMS: Facial and Oral Movements Muscles of Facial Expression: None, normal Lips and Perioral Area: None, normal Jaw: None, normal Tongue: None, normal,Extremity Movements Upper (arms, wrists, hands, fingers): None, normal Lower (legs, knees, ankles, toes): None, normal, Trunk Movements Neck, shoulders, hips: None, normal, Overall Severity Severity of abnormal movements (highest score from questions above): None, normal Incapacitation due to abnormal movements: None, normal Patient's awareness of abnormal movements (rate only patient's report): No Awareness, Dental Status Current problems with teeth and/or dentures?: No Does patient usually wear dentures?: No  CIWA:  CIWA-Ar Total: 2 COWS:  COWS Total Score: 3  Musculoskeletal: Strength & Muscle Tone: within normal limits Gait & Station: normal Patient leans: N/A  Psychiatric  Specialty Exam: Physical Exam  Review of Systems  Psychiatric/Behavioral: Positive for depression. Negative for suicidal ideas, hallucinations and substance abuse. The patient is nervous/anxious and has insomnia.   All other systems reviewed and are negative.   Blood pressure 112/70, pulse 69, temperature 97.6 F (36.4 C), temperature source Oral, resp. rate 16, height 5\' 2"  (1.575 m), weight 61.377 kg (135 lb 5 oz), SpO2 94 %.Body mass index is 24.74 kg/(m^2).  SEE MD PSE IN THE SRA       Have you used any form of tobacco in the last 30 days? (Cigarettes, Smokeless Tobacco, Cigars, and/or Pipes): Yes  Has this patient used any form of tobacco in the last 30 days? (Cigarettes, Smokeless Tobacco, Cigars, and/or Pipes) No  Blood Alcohol level:  Lab Results  Component Value Date   ETH <5 04/28/2016   ETH <5 [id number]    Metabolic Disorder Labs:  No results found for: HGBA1C, MPG No results found for: PROLACTIN Lab Results  Component Value Date   CHOL 113 04/29/2016   TRIG 109 04/29/2016   HDL 49 04/29/2016   CHOLHDL 2.3 04/29/2016   VLDL 22 04/29/2016   LDLCALC 42 04/29/2016    See Psychiatric Specialty Exam and Suicide Risk Assessment completed by Attending Physician prior to discharge.  Discharge destination:  Home  Is patient on multiple antipsychotic therapies  at discharge:  No   Has Patient had three or more failed trials of antipsychotic monotherapy by history:  No  Recommended Plan for Multiple Antipsychotic Therapies: NA     Medication List    STOP taking these medications        liothyronine 25 MCG tablet  Commonly known as:  CYTOMEL     lisinopril 20 MG tablet  Commonly known as:  PRINIVIL,ZESTRIL     PARoxetine 40 MG tablet  Commonly known as:  PAXIL     ramelteon 8 MG tablet  Commonly known as:  ROZEREM     thiothixene 2 MG capsule  Commonly known as:  NAVANE      TAKE these medications      Indication   albuterol 108 (90 Base) MCG/ACT  inhaler  Commonly known as:  PROVENTIL HFA;VENTOLIN HFA  Inhale 2 puffs into the lungs every 6 (six) hours as needed. For shortness of breath      atenolol 50 MG tablet  Commonly known as:  TENORMIN  Take 1 tablet (50 mg total) by mouth 2 (two) times daily.   Indication:  High Blood Pressure     atorvastatin 20 MG tablet  Commonly known as:  LIPITOR  Take 1 tablet (20 mg total) by mouth at bedtime.   Indication:  Elevation of Both Cholesterol and Triglycerides in Blood     diazepam 2 MG tablet  Commonly known as:  VALIUM  Take 1 tablet (2 mg total) by mouth every 12 (twelve) hours as needed for anxiety.   Indication:  Feeling Anxious     diazepam 5 MG tablet  Commonly known as:  VALIUM  Take 1 tablet (5 mg total) by mouth 2 (two) times daily.   Indication:  Feeling Anxious     gabapentin 100 MG capsule  Commonly known as:  NEURONTIN  Take 1 capsule (100 mg total) by mouth 3 (three) times daily.   Indication:  mood stabilization     hydrALAZINE 50 MG tablet  Commonly known as:  APRESOLINE  Take 1.5 tablets (75 mg total) by mouth 3 (three) times daily.   Indication:  High Blood Pressure     hydrOXYzine 25 MG capsule  Commonly known as:  VISTARIL  Take 1 capsule (25 mg total) by mouth 3 (three) times daily as needed for anxiety.   Indication:  Anxiety Neurosis     levothyroxine 75 MCG tablet  Commonly known as:  SYNTHROID, LEVOTHROID  Take 1 tablet (75 mcg total) by mouth daily.  Start taking on:  05/06/2016   Indication:  Underactive Thyroid     potassium chloride SA 20 MEQ tablet  Commonly known as:  K-DUR,KLOR-CON  Take 1 tablet (20 mEq total) by mouth 2 (two) times daily.   Indication:  Low Amount of Potassium in the Blood     sertraline 25 MG tablet  Commonly known as:  ZOLOFT  Take 3 tablets (75 mg total) by mouth daily.   Indication:  Major Depressive Disorder     spironolactone 25 MG tablet  Commonly known as:  ALDACTONE  Take 1 tablet (25 mg total) by  mouth daily.   Indication:  High Blood Pressure     traZODone 150 MG tablet  Commonly known as:  DESYREL  Take 0.5 tablets (75 mg total) by mouth at bedtime as needed for sleep.   Indication:  Trouble Sleeping           Follow-up Information    Follow  up with [facility] On 05/20/2016.   Specialty:  Behavioral Health   Why:  Medication management appt with Josph Macho on Friday June 16th at 2:20pm. Call office if you need to reschedule.    Contact information:   [place] [address] [phone]      Follow-up recommendations:  Activity:  As tolerated Diet:  Heart healthy with low sodium.  Comments:   Take all medications as prescribed. Keep all follow-up appointments as scheduled.  Do not consume alcohol or use illegal drugs while on prescription medications. Report any adverse effects from your medications to your primary care provider promptly.  In the event of recurrent symptoms or worsening symptoms, call 911, a crisis hotline, or go to the nearest emergency department for evaluation.   Signed: Benjamine Mola, FNP 05/05/2016, 10:41 AM  Patient seen, Suicide Assessment Completed.  Disposition Plan Reviewed

## 2016-05-05 NOTE — [facility] Suicide Risk Assessment (Signed)
[facility] INPATIENT:  Family/Significant Other Suicide Prevention Education  Suicide Prevention Education:  Education Completed; Kingsleigh Lozoya, Pt's husband [phone],  has been identified by the patient as the family member/significant other with whom the patient will be residing, and identified as the person(s) who will aid the patient in the event of a mental health crisis (suicidal ideations/suicide attempt).  With written consent from the patient, the family member/significant other has been provided the following suicide prevention education, prior to the and/or following the discharge of the patient.  The suicide prevention education provided includes the following:  Suicide risk factors  Suicide prevention and interventions  National Suicide Hotline telephone number  [facility] assessment telephone number  [facility] Emergency Assistance [place] and/or Residential Mobile Crisis Unit telephone number  Request made of family/significant other to:  Remove weapons (e.g., guns, rifles, knives), all items previously/currently identified as safety concern.    Remove drugs/medications (over-the-counter, prescriptions, illicit drugs), all items previously/currently identified as a safety concern.  The family member/significant other verbalizes understanding of the suicide prevention education information provided.  The family member/significant other agrees to remove the items of safety concern listed above.  Bo Mcclintock 05/05/2016, 11:02 AM

## 2016-05-05 NOTE — Progress Notes (Signed)
  [facility] Adult Case Management Discharge Plan :  Will you be returning to the same living situation after discharge:  Yes, patient plans to return to family At discharge, do you have transportation home?: Yes, patient's family Do you have the ability to pay for your medications: Yes,  patient will be provided with prescriptions at discharge  Release of information consent forms completed and in the chart;  Patient's signature needed at discharge.  Patient to Follow up at: Follow-up Information    Follow up with [facility] On 05/20/2016.   Specialty:  Behavioral Health   Why:  Medication management appt with Josph Macho on Friday June 16th at 2:20pm. Call office if you need to reschedule.    Contact information:   [place] [address] [phone]       Next level of care provider has access to [place] and Suicide Prevention discussed: Yes,  with patient  Have you used any form of tobacco in the last 30 days? (Cigarettes, Smokeless Tobacco, Cigars, and/or Pipes): Yes  Has patient been referred to the Quitline?: Patient refused referral  Patient has been referred for addiction treatment: Yes  Eugina Row, Casimiro Needle 05/05/2016, 10:40 AM

## 2016-05-05 NOTE — [facility] Suicide Risk Assessment (Addendum)
[facility] Discharge Suicide Risk Assessment   Principal Problem: Major depressive disorder, recurrent episode [facility]) Discharge Diagnoses:  Patient Active Problem List   Diagnosis Date Noted  . Hyperthyroidism [E05.90] 04/30/2016  . Severe episode of recurrent major depressive disorder, without psychotic features ([place]) [F33.2]   . Major depressive disorder, recurrent episode ([place]) [F33.9] 04/28/2016  . Chronic obstructive pulmonary disease ([place]) [J44.9]   . Overdose [T50.901A] 03/07/2016  . Hypokalemia [E87.6] 03/07/2016  . Hypothyroidism [E03.9] 03/07/2016  . Schizoaffective disorder ([place]) [F25.9] 03/07/2016  . Essential hypertension [I10] 03/07/2016  . COPD exacerbation ([place]) [J44.1] 03/07/2016  . Tobacco abuse [Z72.0] 03/07/2016  . Pneumonia [J18.9] 03/06/2016  . CAP (community acquired pneumonia) [J18.9] 03/06/2016    Total Time spent with patient: 30 minutes  Musculoskeletal: Strength & Muscle Tone: within normal limits Gait & Station: normal Patient leans: N/A  Psychiatric Specialty Exam: ROS  Blood pressure 112/70, pulse 69, temperature 97.6 F (36.4 C), temperature source Oral, resp. rate 16, height 5\' 2"  (1.575 m), weight 135 lb 5 oz (61.377 kg), SpO2 94 %.Body mass index is 24.74 kg/(m^2).  General Appearance: improved grooming   Eye Contact::  Good  Speech:  Normal Rate409  Volume:  Normal  Mood:  improved, and at this time denies feeling depressed, also reports decreased anxiety  Affect:  Appropriate and more reactive   Thought Process:  Linear  Orientation:  Full (Time, Place, and Person)  Thought Content:  denies hallucinations, no delusions   Suicidal Thoughts:  No- denies any suicidal or self injurious ideations, and does not appear internally preoccupied   Homicidal Thoughts:  No  Memory:  recent and remote intact   Judgement:  Other:  improved   Insight:  improved  Psychomotor Activity:  Normal  Concentration:  improved  Recall:  Good  Fund of  Knowledge:Good  Language: Good  Akathisia:  Negative  Handed:  Right  AIMS (if indicated):     Assets:  Desire for Improvement Resilience Social Support  Sleep:  Number of Hours: 6.75  Cognition: WNL  ADL's:  Intact   Mental Status Per Nursing Assessment::   On Admission:     Demographic Factors:  60 year old married ( separated ) female, lives alone   Loss Factors: Lives alone.   Historical Factors: History of depression, anxiety, panic attacks, prior psychiatric admission   Risk Reduction Factors:   Positive social support and Positive coping skills or problem solving skills  Continued Clinical Symptoms:  At this time patient presents improved compared to her admission presentation - mood is improved, affect is brighter, no longer anxious , no thought disorder, no SI, no HI,  No psychotic symptoms, future oriented . 0x3.  At this time denies medication side effects. Follow up labs - Na+ serum level improved to 132.     Cognitive Features That Contribute To Risk:  No gross cognitive deficits noted upon discharge. Is alert , attentive, and oriented x 3   Suicide Risk:  Mild:  Suicidal ideation of limited frequency, intensity, duration, and specificity.  There are no identifiable plans, no associated intent, mild dysphoria and related symptoms, good self-control (both objective and subjective assessment), few other risk factors, and identifiable protective factors, including available and accessible social support.  Follow-up Information    Follow up with [facility] On 05/20/2016.   Specialty:  Behavioral Health   Why:  Medication management appt with Josph Macho on Friday June 16th at 2:20pm. Call office if you need to reschedule.  Contact information:   [place] [address] [phone]       Plan Of Care/Follow-up recommendations:  Activity:  as tolerated  Diet:  regular  Tests:  NA Other:  see below  Patient is leaving unit in good spirits Plans to  return home States her husband ( they are separated but have a good relationship- he is a major support for her) is picking her up later today Plans to follow up as above She has a PCP whom she follows with for medical issues .  Neita Garnet, MD 05/05/2016, 1:51 PM

## 2016-09-14 ENCOUNTER — Ambulatory Visit (HOSPITAL_COMMUNITY)
Admission: RE | Admit: 2016-09-14 | Discharge: 2016-09-14 | Disposition: A | Discharge status: Home / Self Care | Payer: Medicare Other | Attending: Psychiatry | Admitting: Psychiatry

## 2016-09-14 DIAGNOSIS — F320 Major depressive disorder, single episode, mild: Secondary | ICD-10-CM | POA: Insufficient documentation

## 2016-09-14 DIAGNOSIS — F17290 Nicotine dependence, other tobacco product, uncomplicated: Secondary | ICD-10-CM | POA: Diagnosis not present

## 2016-09-14 NOTE — BH Assessment (Signed)
Assessment Note  Audrey Meyer is a 60 y.o. female who presents voluntarily as a [place] walk in due to reportedly being "very depressed" and having "real bad anxiety". Pt denies SI, HI, AVH. Pt initially states that she had been getting little to no sleep. Pt then indicated that last week she had one day with no sleep, then she decided, at the advice of family, to take a whole Trazodone tablet, as opposed to the 1/2 tablet she'd been prescribed. After this change, pt reports sleeping well. Pt also reports have anxiety attacks "real often", indicating that she had one while in the waiting room. Pt presented as very calm during the assessment and hadn't been waiting in the waiting room long. Writer asked pt how she was able to calm herself and she indicated that she was not able to calm herself and that she was "shaking on the inside". Pt endorses depression x 3-4 weeks. Pt identifies triggers as her being worried about her son b/c his back went out on him and worries "some about bills".  Pt was recommended to follow up with her OP provider, [facility]. She was also given a list of other psychiatrists/therapists.    Diagnosis: MDD, single episode, mild  Past Medical History:  Past Medical History:  Diagnosis Date  . Acid reflux   . Breast cancer ([place])   . COPD (chronic obstructive pulmonary disease) ([place])   . High cholesterol   . Hypertension   . Hypothyroid   . Schizoaffective disorder     Past Surgical History:  Procedure Laterality Date  . APPENDECTOMY    . MASTECTOMY, PARTIAL      Family History:  Family History  Problem Relation Age of Onset  . Heart attack Father   . Cancer Other     Social History:  reports that she has been smoking Cigars.  She has been smoking about 0.00 packs per day for the past 2.00 years. She does not have any smokeless tobacco history on file. She reports that she does not drink alcohol or use drugs.  Additional Social History:  Alcohol / Drug Use Pain  Medications: see PTA meds (pt unsure of changes) Prescriptions: see PTA meds (pt unsure of changes) Over the Counter: see PTA meds (pt unsure of changes) History of alcohol / drug use?: No history of alcohol / drug abuse  CIWA:   COWS:    Allergies:  Allergies  Allergen Reactions  . Budesonide Shortness Of Breath    Home Medications:  (Not in a hospital admission)  OB/GYN Status:  No LMP recorded. Patient is postmenopausal.  General Assessment Data [facility] TTS Assessment: In system Is this a Tele or Face-to-Face Assessment?: Face-to-Face Is this an Initial Assessment or a Re-assessment for this encounter?: Initial Assessment Marital status: Separated Is patient pregnant?: No Pregnancy Status: No Living Arrangements: Alone Can pt return to current living arrangement?: Yes Admission Status: Voluntary Is patient capable of signing voluntary admission?: Yes Referral Source: Self/Family/Friend Insurance type: Medicare  Medical Screening Exam ([place]) Medical Exam completed: Yes  Crisis Care Plan Living Arrangements: Alone Name of Psychiatrist: Beverly Sessions Name of Therapist: none  Education Status Is patient currently in school?: No  Risk to self with the past 6 months Suicidal Ideation: No Has patient been a risk to self within the past 6 months prior to admission? : Yes Suicidal Intent: No-Not Currently/Within Last 6 Months Has patient had any suicidal intent within the past 6  months prior to admission? : Yes Is patient at risk for suicide?: No Suicidal Plan?: No Has patient had any suicidal plan within the past 6 months prior to admission? : No Access to Means: No What has been your use of drugs/alcohol within the last 12 months?: pt denies Previous Attempts/Gestures: Yes How many times?: 2 Triggers for Past Attempts: Unpredictable, Unknown Intentional Self Injurious Behavior: None Family Suicide History:  Unknown Recent stressful life event(s): Other (Comment) (see narrative) Persecutory voices/beliefs?: No Depression: Yes Depression Symptoms: Feeling worthless/self pity, Isolating, Feeling angry/irritable Substance abuse history and/or treatment for substance abuse?: No Suicide prevention information given to non-admitted patients: Not applicable  Risk to Others within the past 6 months Homicidal Ideation: No Does patient have any lifetime risk of violence toward others beyond the six months prior to admission? : No Thoughts of Harm to Others: No Current Homicidal Intent: No Current Homicidal Plan: No Access to Homicidal Means: No History of harm to others?: No Assessment of Violence: None Noted Does patient have access to weapons?: No Criminal Charges Pending?: No Does patient have a court date: No Is patient on probation?: No  Psychosis Hallucinations: None noted Delusions: None noted  Mental Status Report Appearance/Hygiene: Unremarkable Eye Contact: Fair Motor Activity: Unremarkable Speech: Logical/coherent Level of Consciousness: Alert Mood: Sad Affect: Appropriate to circumstance Anxiety Level: Minimal Thought Processes: Relevant, Coherent Judgement: Unimpaired Orientation: Person, Place, Time, Appropriate for developmental age, Situation Obsessive Compulsive Thoughts/Behaviors: None  Cognitive Functioning Concentration: Normal Memory: Recent Intact, Remote Intact IQ: Average Insight: Good Impulse Control: Good Appetite: Good Sleep: No Change Vegetative Symptoms: None  ADLScreening [facility] Assessment Services) Patient's cognitive ability adequate to safely complete daily activities?: Yes Patient able to express need for assistance with ADLs?: Yes Independently performs ADLs?: Yes (appropriate for developmental age)  Prior Inpatient Therapy Prior Inpatient Therapy: Yes Prior Therapy Dates: April, May 2017; @ 16 years ago Prior Therapy Facilty/Provider(s):  [facility], [facility] seismologist Reason for Treatment: depression; anxiety  Prior Outpatient Therapy Prior Outpatient Therapy: No Does patient have an ACCT team?: No Does patient have Intensive In-House Services?  : No Does patient have [facility] services? : Yes Does patient have [facility]4CC services?: No  ADL Screening (condition at time of admission) Patient's cognitive ability adequate to safely complete daily activities?: Yes Is the patient deaf or have difficulty hearing?: No Does the patient have difficulty seeing, even when wearing glasses/contacts?: No Does the patient have difficulty concentrating, remembering, or making decisions?: No Patient able to express need for assistance with ADLs?: Yes Does the patient have difficulty dressing or bathing?: No Independently performs ADLs?: Yes (appropriate for developmental age) Does the patient have difficulty walking or climbing stairs?: No Weakness of Legs: None Weakness of Arms/Hands: None  Home Assistive Devices/Equipment Home Assistive Devices/Equipment: None  Therapy Consults (therapy consults require a physician order) PT Evaluation Needed: No OT Evalulation Needed: No SLP Evaluation Needed: No Abuse/Neglect Assessment (Assessment to be complete while patient is alone) Physical Abuse: Denies Verbal Abuse: Denies Sexual Abuse: Denies Exploitation of patient/patient's resources: Denies Self-Neglect: Denies Values / Beliefs Cultural Requests During Hospitalization: None Spiritual Requests During Hospitalization: None Consults Spiritual Care Consult Needed: No Social Work Consult Needed: No Regulatory affairs officer (For Healthcare) Does patient have an advance directive?: No Would patient like information on creating an advanced directive?: No - patient declined information    Additional Information 1:1 In Past 12 Months?: No CIRT Risk: No Elopement Risk: No Does patient have medical clearance?: No     Disposition:  Disposition Initial  Assessment Completed for this Encounter: Yes (consulted with Catalina Pizza, NP) Disposition of Patient: Other dispositions Other disposition(s): To current provider (f/u with [facility])  On Site Evaluation by:   Reviewed with Physician:    Rexene Edison 09/14/2016 4:47 PM

## 2016-09-14 NOTE — H&P (Signed)
Behavioral Health Medical Screening Exam  Audrey Meyer is an 60 y.o. female.  Total Time spent with patient: 15 minutes  Psychiatric Specialty Exam: Physical Exam  Constitutional: She is oriented to person, place, and time. She appears well-developed and well-nourished.  HENT:  Head: Normocephalic and atraumatic.  Eyes: EOM are normal. Pupils are equal, round, and reactive to light.  Neck: Normal range of motion. Neck supple.  Cardiovascular: Normal rate, regular rhythm and normal heart sounds.   Respiratory: Effort normal and breath sounds normal.  GI: Soft. Bowel sounds are normal.  Musculoskeletal: Normal range of motion.  Neurological: She is alert and oriented to person, place, and time.  Skin: Skin is warm and dry.    Review of Systems  Psychiatric/Behavioral: Positive for depression.  All other systems reviewed and are negative.   Blood pressure 100/63, pulse 65, temperature 98.4 F (36.9 C), temperature source Oral, resp. rate 16, SpO2 97 %.There is no height or weight on file to calculate BMI.  General Appearance: Casual and Fairly Groomed, wearing a bathrobe and knit cap  Eye Contact:  Good  Speech:  Clear and Coherent and Slow  Volume:  Normal  Mood:  Depressed  Affect:  Congruent and Depressed  Thought Process:  Coherent, Goal Directed and Linear  Orientation:  Full (Time, Place, and Person)  Thought Content:  Logical  Suicidal Thoughts:  No  Homicidal Thoughts:  No  Memory:  Immediate;   Good Recent;   Good Remote;   Fair  Judgement:  Good  Insight:  Fair  Psychomotor Activity:  Normal  Concentration: Attention Span: Good  Recall:  Good  Fund of Knowledge:Good  Language: Good  Akathisia:  No  Handed:  Right  AIMS (if indicated):     Assets:  Communication Skills Desire for Improvement Resilience Social Support  Sleep:       Musculoskeletal: Strength & Muscle Tone: within normal limits Gait & Station: normal Patient leans: N/A  Blood  pressure 100/63, pulse 65, temperature 98.4 F (36.9 C), temperature source Oral, resp. rate 16, SpO2 97 %.  Recommendations:  Based on my evaluation the patient does not appear to have an emergency medical condition.   Outpatient resources given to follow up at [facility], [place], NP 09/14/2016, 5:00 PM

## 2016-09-27 ENCOUNTER — Emergency Department (HOSPITAL_COMMUNITY)
Admission: EM | Admit: 2016-09-27 | Discharge: 2016-09-28 | Disposition: A | Discharge status: Home / Self Care | Payer: Medicare Other | Attending: Emergency Medicine | Admitting: Emergency Medicine

## 2016-09-27 ENCOUNTER — Ambulatory Visit (HOSPITAL_COMMUNITY)
Admission: RE | Admit: 2016-09-27 | Discharge: 2016-09-27 | Disposition: A | Discharge status: Home / Self Care | Payer: Medicare Other | Source: Home / Self Care | Attending: Psychiatry | Admitting: Psychiatry

## 2016-09-27 ENCOUNTER — Encounter (HOSPITAL_COMMUNITY): Payer: Self-pay

## 2016-09-27 DIAGNOSIS — Z808 Family history of malignant neoplasm of other organs or systems: Secondary | ICD-10-CM | POA: Diagnosis not present

## 2016-09-27 DIAGNOSIS — F17290 Nicotine dependence, other tobacco product, uncomplicated: Secondary | ICD-10-CM | POA: Diagnosis not present

## 2016-09-27 DIAGNOSIS — J449 Chronic obstructive pulmonary disease, unspecified: Secondary | ICD-10-CM | POA: Insufficient documentation

## 2016-09-27 DIAGNOSIS — F17210 Nicotine dependence, cigarettes, uncomplicated: Secondary | ICD-10-CM | POA: Diagnosis not present

## 2016-09-27 DIAGNOSIS — F329 Major depressive disorder, single episode, unspecified: Secondary | ICD-10-CM

## 2016-09-27 DIAGNOSIS — I10 Essential (primary) hypertension: Secondary | ICD-10-CM | POA: Diagnosis not present

## 2016-09-27 DIAGNOSIS — F419 Anxiety disorder, unspecified: Secondary | ICD-10-CM | POA: Diagnosis present

## 2016-09-27 DIAGNOSIS — E039 Hypothyroidism, unspecified: Secondary | ICD-10-CM | POA: Insufficient documentation

## 2016-09-27 DIAGNOSIS — Z79899 Other long term (current) drug therapy: Secondary | ICD-10-CM | POA: Diagnosis not present

## 2016-09-27 DIAGNOSIS — Z853 Personal history of malignant neoplasm of breast: Secondary | ICD-10-CM | POA: Insufficient documentation

## 2016-09-27 DIAGNOSIS — Z8249 Family history of ischemic heart disease and other diseases of the circulatory system: Secondary | ICD-10-CM | POA: Diagnosis not present

## 2016-09-27 DIAGNOSIS — F410 Panic disorder [episodic paroxysmal anxiety] without agoraphobia: Secondary | ICD-10-CM

## 2016-09-27 DIAGNOSIS — F331 Major depressive disorder, recurrent, moderate: Secondary | ICD-10-CM

## 2016-09-27 DIAGNOSIS — F332 Major depressive disorder, recurrent severe without psychotic features: Secondary | ICD-10-CM | POA: Diagnosis present

## 2016-09-27 LAB — RAPID URINE DRUG SCREEN, HOSP PERFORMED
AMPHETAMINES: NOT DETECTED
BENZODIAZEPINES: POSITIVE — AB
Barbiturates: NOT DETECTED
COCAINE: NOT DETECTED
Opiates: NOT DETECTED
Tetrahydrocannabinol: NOT DETECTED

## 2016-09-27 LAB — COMPREHENSIVE METABOLIC PANEL
ALT: 17 U/L (ref 14–54)
AST: 29 U/L (ref 15–41)
Albumin: 5.2 g/dL — ABNORMAL HIGH (ref 3.5–5.0)
Alkaline Phosphatase: 68 U/L (ref 38–126)
Anion gap: 10 (ref 5–15)
BILIRUBIN TOTAL: 0.8 mg/dL (ref 0.3–1.2)
BUN: 16 mg/dL (ref 6–20)
CO2: 24 mmol/L (ref 22–32)
Calcium: 9.7 mg/dL (ref 8.9–10.3)
Chloride: 106 mmol/L (ref 101–111)
Creatinine, Ser: 0.97 mg/dL (ref 0.44–1.00)
Glucose, Bld: 96 mg/dL (ref 65–99)
POTASSIUM: 3.5 mmol/L (ref 3.5–5.1)
Sodium: 140 mmol/L (ref 135–145)
TOTAL PROTEIN: 8.5 g/dL — AB (ref 6.5–8.1)

## 2016-09-27 LAB — SALICYLATE LEVEL: Salicylate Lvl: <7 mg/dL (ref 2.8–30.0)

## 2016-09-27 LAB — ACETAMINOPHEN LEVEL: Acetaminophen (Tylenol), Serum: <10 ug/mL — ABNORMAL LOW (ref 10–30)

## 2016-09-27 LAB — CBC
HEMATOCRIT: 39.2 % (ref 36.0–46.0)
Hemoglobin: 13.6 g/dL (ref 12.0–15.0)
MCH: 30.2 pg (ref 26.0–34.0)
MCHC: 34.7 g/dL (ref 30.0–36.0)
MCV: 87.1 fL (ref 78.0–100.0)
PLATELETS: 267 10*3/uL (ref 150–400)
RBC: 4.5 MIL/uL (ref 3.87–5.11)
RDW: 14.5 % (ref 11.5–15.5)
WBC: 10.4 10*3/uL (ref 4.0–10.5)

## 2016-09-27 LAB — ETHANOL: Alcohol, Ethyl (B): <5 mg/dL (ref <5)

## 2016-09-27 MED ORDER — HYDROXYZINE HCL 25 MG PO TABS
25.0000 mg | ORAL_TABLET | Freq: Three times a day (TID) | ORAL | Status: DC | PRN
  Filled 2016-09-27: qty 1

## 2016-09-27 MED ORDER — SERTRALINE HCL 50 MG PO TABS
100.0000 mg | ORAL_TABLET | Freq: Every day | ORAL | Status: DC
  Administered 2016-09-28: 100 mg via ORAL
  Filled 2016-09-27: qty 2

## 2016-09-27 MED ORDER — IBUPROFEN 200 MG PO TABS: 600.0000 mg | ORAL_TABLET | Freq: Three times a day (TID) | ORAL | Status: DC | PRN

## 2016-09-27 MED ORDER — CHLORTHALIDONE 25 MG PO TABS
25.0000 mg | ORAL_TABLET | Freq: Two times a day (BID) | ORAL | Status: DC
  Administered 2016-09-28 (×2): 25 mg via ORAL
  Filled 2016-09-27 (×2): qty 1

## 2016-09-27 MED ORDER — LEVOTHYROXINE SODIUM 88 MCG PO TABS
88.0000 ug | ORAL_TABLET | Freq: Every day | ORAL | Status: DC
  Administered 2016-09-28: 88 ug via ORAL
  Filled 2016-09-27: qty 1

## 2016-09-27 MED ORDER — NICOTINE 21 MG/24HR TD PT24: 21.0000 mg | MEDICATED_PATCH | Freq: Every day | TRANSDERMAL | Status: DC | PRN

## 2016-09-27 MED ORDER — ATENOLOL 50 MG PO TABS: 50.0000 mg | ORAL_TABLET | Freq: Two times a day (BID) | ORAL | Status: DC

## 2016-09-27 MED ORDER — ONDANSETRON HCL 4 MG PO TABS
4.0000 mg | ORAL_TABLET | Freq: Three times a day (TID) | ORAL | Status: DC | PRN
  Administered 2016-09-28: 4 mg via ORAL
  Filled 2016-09-27: qty 1

## 2016-09-27 MED ORDER — GABAPENTIN 100 MG PO CAPS
100.0000 mg | ORAL_CAPSULE | Freq: Three times a day (TID) | ORAL | Status: DC
  Administered 2016-09-27 – 2016-09-28 (×2): 100 mg via ORAL
  Filled 2016-09-27 (×2): qty 1

## 2016-09-27 MED ORDER — ZOLPIDEM TARTRATE 5 MG PO TABS: 5.0000 mg | ORAL_TABLET | Freq: Every evening | ORAL | Status: DC | PRN

## 2016-09-27 MED ORDER — ATENOLOL-CHLORTHALIDONE 50-25 MG PO TABS: 1.0000 | ORAL_TABLET | Freq: Two times a day (BID) | ORAL | Status: DC

## 2016-09-27 MED ORDER — SPIRONOLACTONE 25 MG PO TABS
25.0000 mg | ORAL_TABLET | Freq: Every day | ORAL | Status: DC
  Administered 2016-09-28: 25 mg via ORAL
  Filled 2016-09-27: qty 1

## 2016-09-27 MED ORDER — DIAZEPAM 5 MG PO TABS
5.0000 mg | ORAL_TABLET | Freq: Two times a day (BID) | ORAL | Status: DC
  Administered 2016-09-27 – 2016-09-28 (×2): 5 mg via ORAL
  Filled 2016-09-27 (×2): qty 1

## 2016-09-27 MED ORDER — TRAZODONE HCL 50 MG PO TABS
75.0000 mg | ORAL_TABLET | Freq: Every evening | ORAL | Status: DC | PRN
  Administered 2016-09-28: 75 mg via ORAL
  Filled 2016-09-27: qty 2

## 2016-09-27 MED ORDER — ACETAMINOPHEN 325 MG PO TABS: 650.0000 mg | ORAL_TABLET | ORAL | Status: DC | PRN

## 2016-09-27 MED ORDER — HYDRALAZINE HCL 50 MG PO TABS
75.0000 mg | ORAL_TABLET | Freq: Three times a day (TID) | ORAL | Status: DC
  Administered 2016-09-28 (×2): 75 mg via ORAL
  Filled 2016-09-27 (×3): qty 1

## 2016-09-27 MED ORDER — ATENOLOL 50 MG PO TABS
50.0000 mg | ORAL_TABLET | Freq: Two times a day (BID) | ORAL | Status: DC
  Administered 2016-09-28 (×2): 50 mg via ORAL
  Filled 2016-09-27 (×2): qty 1

## 2016-09-27 MED ORDER — ATORVASTATIN CALCIUM 20 MG PO TABS
20.0000 mg | ORAL_TABLET | Freq: Every day | ORAL | Status: DC
  Administered 2016-09-28: 20 mg via ORAL
  Filled 2016-09-27: qty 1

## 2016-09-27 MED ORDER — ALUM & MAG HYDROXIDE-SIMETH 200-200-20 MG/5ML PO SUSP: 30.0000 mL | ORAL | Status: DC | PRN

## 2016-09-27 MED ORDER — POTASSIUM CHLORIDE CRYS ER 20 MEQ PO TBCR
20.0000 meq | EXTENDED_RELEASE_TABLET | Freq: Two times a day (BID) | ORAL | Status: DC
  Administered 2016-09-28 (×2): 20 meq via ORAL
  Filled 2016-09-27 (×2): qty 1

## 2016-09-27 MED ORDER — PAROXETINE HCL 20 MG PO TABS
40.0000 mg | ORAL_TABLET | Freq: Every morning | ORAL | Status: DC
  Administered 2016-09-28: 40 mg via ORAL
  Filled 2016-09-27: qty 2

## 2016-09-27 NOTE — ED Notes (Signed)
Pt was placed in paper scrubs, wanded by security and one bag of belongings taken to TCU.

## 2016-09-27 NOTE — H&P (Signed)
Behavioral Health Medical Screening Exam  Audrey Meyer is an 60 y.o. female who presents as a walk in due to increasing anxiety and panic attacks. The patient appears tense during the assessment today stating "My anxiety is so severe. Please don't send me home. Dr. Parke Poisson said I could come back when I was here in May 2017 if I felt like this again. I'm worried that I will start having suicidal thoughts." Medical history of hypothyroidism. Will need medical clearance prior to being accepted for inpatient admission.   Total Time spent with patient: 15 minutes  Psychiatric Specialty Exam: Physical Exam  Constitutional: She is oriented to person, place, and time. She appears well-developed and well-nourished.  HENT:  Head: Normocephalic and atraumatic.  Eyes: EOM are normal. Pupils are equal, round, and reactive to light.  Neck: Normal range of motion. Neck supple.  Cardiovascular: Normal rate, regular rhythm and normal heart sounds.   Respiratory: Effort normal and breath sounds normal.  GI: Soft. Bowel sounds are normal.  Musculoskeletal: Normal range of motion.  Neurological: She is alert and oriented to person, place, and time.  Skin: Skin is warm and dry.    Review of Systems  Psychiatric/Behavioral: Positive for depression. The patient is nervous/anxious.   All other systems reviewed and are negative.   Blood pressure 116/75, pulse 68, temperature 98.1 F (36.7 C), resp. rate 18.There is no height or weight on file to calculate BMI.  General Appearance: Casual and Fairly Groomed, wearing a bathrobe and knit cap  Eye Contact:  Good  Speech:  Clear and Coherent and Slow  Volume:  Normal  Mood:  Depressed  Affect:  Congruent and Depressed  Thought Process:  Coherent, Goal Directed and Linear  Orientation:  Full (Time, Place, and Person)  Thought Content:  Logical  Suicidal Thoughts:  No  Homicidal Thoughts:  No  Memory:  Immediate;   Good Recent;   Good Remote;   Fair   Judgement:  Good  Insight:  Fair  Psychomotor Activity:  Normal  Concentration: Attention Span: Good  Recall:  Good  Fund of Knowledge:Good  Language: Good  Akathisia:  No  Handed:  Right  AIMS (if indicated):     Assets:  Communication Skills Desire for Improvement Resilience Social Support  Sleep:       Musculoskeletal: Strength & Muscle Tone: within normal limits Gait & Station: normal Patient leans: N/A  Blood pressure 116/75, pulse 68, temperature 98.1 F (36.7 C), resp. rate 18.  Recommendations:  Based on my evaluation the patient does not appear to have an emergency medical condition.   Elmarie Shiley, NP 09/27/2016, 6:05 PM

## 2016-09-27 NOTE — ED Notes (Signed)
Called pharmacy to request they verify meds ordered.

## 2016-09-27 NOTE — BH Assessment (Addendum)
Assessment Note  Audrey Meyer is a 60 y.o. female presenting voluntarily as a [place] walk in with c/o "anxiety attacks, confusion, racing thoughts, depression". Pt also reports that she "can't sleep or eat". Pt denies SI, but indicated that she felt she was getting close to that point. Pt was here as a walk-in less than 2 weeks ago with the same complaints. Pt was unable to verify if she followed up with [facility], as was recommended. Pt shared she has an appt with [facility] on Friday (in 3 days), but did not feel that she could make it until then. Pt was also unable to quantify her sleeping or eating.  Diagnosis: MDD, recurrent episode, severe; GAD  Past Medical History:  Past Medical History:  Diagnosis Date  . Acid reflux   . Breast cancer ([place])   . COPD (chronic obstructive pulmonary disease) ([place])   . High cholesterol   . Hypertension   . Hypothyroid   . Schizoaffective disorder     Past Surgical History:  Procedure Laterality Date  . APPENDECTOMY    . MASTECTOMY, PARTIAL      Family History:  Family History  Problem Relation Age of Onset  . Heart attack Father   . Cancer Other     Social History:  reports that she has been smoking Cigars.  She has been smoking about 0.00 packs per day for the past 2.00 years. She does not have any smokeless tobacco history on file. She reports that she does not drink alcohol or use drugs.  Additional Social History:  Alcohol / Drug Use Pain Medications: see PTA meds (pt unsure of changes) Prescriptions: see PTA meds (pt unsure of changes) Over the Counter: see PTA meds (pt unsure of changes) History of alcohol / drug use?: No history of alcohol / drug abuse  CIWA: CIWA-Ar BP: 116/75 Pulse Rate: 68 COWS:    Allergies:  Allergies  Allergen Reactions  . Budesonide Shortness Of Breath    Home Medications:  (Not in a hospital admission)  OB/GYN Status:  No LMP recorded. Patient is postmenopausal.  General Assessment Data Location  of Assessment: [facility] Assessment Services TTS Assessment: In system Is this a Tele or Face-to-Face Assessment?: Face-to-Face Is this an Initial Assessment or a Re-assessment for this encounter?: Initial Assessment Marital status: Separated Is patient pregnant?: No Pregnancy Status: No Living Arrangements: Alone Can pt return to current living arrangement?: Yes Admission Status: Voluntary Is patient capable of signing voluntary admission?: Yes Referral Source: Self/Family/Friend Insurance type: Medicare  Medical Screening Exam ([place]) Medical Exam completed: Yes  Crisis Care Plan Living Arrangements: Alone Name of Psychiatrist: Beverly Sessions Name of Therapist: none  Education Status Is patient currently in school?: No  Risk to self with the past 6 months Suicidal Ideation: No Has patient been a risk to self within the past 6 months prior to admission? : No Suicidal Intent: No Has patient had any suicidal intent within the past 6 months prior to admission? : No Is patient at risk for suicide?: No Suicidal Plan?: No Has patient had any suicidal plan within the past 6 months prior to admission? : No Access to Means: No What has been your use of drugs/alcohol within the last 12 months?: pt denies Previous Attempts/Gestures: Yes How many times?: 2 Triggers for Past Attempts: Unpredictable, Unknown Intentional Self Injurious Behavior: None Family Suicide History: Unknown Recent stressful life event(s): Other (Comment) (constant anxiety attacks) Persecutory voices/beliefs?: No Depression: Yes Depression Symptoms: Tearfulness, Insomnia Substance  abuse history and/or treatment for substance abuse?: No Suicide prevention information given to non-admitted patients: Not applicable  Risk to Others within the past 6 months Homicidal Ideation: No Does patient have any lifetime risk of violence toward others beyond the six months prior to admission? : No Thoughts of Harm to Others:  No Current Homicidal Intent: No Current Homicidal Plan: No Access to Homicidal Means: No History of harm to others?: No Assessment of Violence: None Noted Does patient have access to weapons?: No Criminal Charges Pending?: No Does patient have a court date: No Is patient on probation?: No  Psychosis Hallucinations: None noted Delusions: None noted  Mental Status Report Appearance/Hygiene: Unremarkable Eye Contact: Fair Motor Activity: Unremarkable Speech: Logical/coherent Level of Consciousness: Alert Mood: Depressed Affect: Appropriate to circumstance Anxiety Level: Minimal Thought Processes: Unable to Assess Judgement: Partial Orientation: Person, Place, Time, Appropriate for developmental age, Situation Obsessive Compulsive Thoughts/Behaviors: None  Cognitive Functioning Concentration: Normal Memory: Recent Impaired, Remote Impaired IQ: Average Insight: see judgement above Impulse Control: Unable to Assess Appetite: Poor Sleep: No Change Vegetative Symptoms: None  ADLScreening [facility] Assessment Services) Patient's cognitive ability adequate to safely complete daily activities?: Yes Patient able to express need for assistance with ADLs?: Yes Independently performs ADLs?: Yes (appropriate for developmental age)  Prior Inpatient Therapy Prior Inpatient Therapy: Yes Prior Therapy Dates: April, May 2017; @ 16 years ago Prior Therapy Facilty/Provider(s): [facility], Field seismologist Reason for Treatment: depression; anxiety  Prior Outpatient Therapy Prior Outpatient Therapy: No Does patient have an ACCT team?: No Does patient have Intensive In-House Services?  : No Does patient have [facility] services? : Yes Does patient have [facility]4CC services?: No  ADL Screening (condition at time of admission) Patient's cognitive ability adequate to safely complete daily activities?: Yes Is the patient deaf or have difficulty hearing?: No Does the patient have difficulty seeing, even when wearing  glasses/contacts?: No Does the patient have difficulty concentrating, remembering, or making decisions?: No Patient able to express need for assistance with ADLs?: Yes Does the patient have difficulty dressing or bathing?: No Independently performs ADLs?: Yes (appropriate for developmental age) Does the patient have difficulty walking or climbing stairs?: No Weakness of Legs: None Weakness of Arms/Hands: None  Home Assistive Devices/Equipment Home Assistive Devices/Equipment: None  Therapy Consults (therapy consults require a physician order) PT Evaluation Needed: No OT Evalulation Needed: No SLP Evaluation Needed: No Abuse/Neglect Assessment (Assessment to be complete while patient is alone) Physical Abuse: Denies Verbal Abuse: Denies Sexual Abuse: Denies Exploitation of patient/patient's resources: Denies Self-Neglect: Denies Values / Beliefs Cultural Requests During Hospitalization: None Spiritual Requests During Hospitalization: None Consults Spiritual Care Consult Needed: No Social Work Consult Needed: No Regulatory affairs officer (For Healthcare) Does patient have an advance directive?: No Would patient like information on creating an advanced directive?: No - patient declined information    Additional Information 1:1 In Past 12 Months?: No CIRT Risk: No Elopement Risk: No Does patient have medical clearance?: No     Disposition:  Disposition Initial Assessment Completed for this Encounter: Yes (consulted with Elmarie Shiley, NP) Disposition of Patient: Inpatient treatment program Type of inpatient treatment program: Adult  On Site Evaluation by:   Reviewed with Physician:    Rexene Edison 09/27/2016 6:25 PM

## 2016-09-27 NOTE — ED Notes (Signed)
Bed: [facility] Expected date:  Expected time:  Means of arrival:  Comments: 

## 2016-09-27 NOTE — Progress Notes (Signed)
Per Lelon Frohlich at [facility], patient can come voluntary or under IVC. Dr. Eulis Foster advised that patient will go voluntary. Ann at [facility] has been informed and will be faxing voluntary paperwork to fax# [phone] ([facility] Triage, to RN Abbie) for patient to sign and put her initials.   Verlon Setting, [place] Disposition staff 09/27/2016 10:39 PM

## 2016-09-27 NOTE — ED Notes (Signed)
Pt can go to [facility] tomorrow morning and arrive after 10 am. Report can be called to [phone]. Accepting physician is Dr. Selinda Flavin.

## 2016-09-27 NOTE — ED Notes (Signed)
Pt's belongings placed in locker 28

## 2016-09-27 NOTE — Progress Notes (Signed)
Patient was referred to [facility].   Patient is  accepted at [facility], to Dr. Jonelle Sports, please arrive after 10:00am tomorrow on 09/28/16. Call report at [phone]. RN [facility] Ledell Noss has been informed.  Verlon Setting, [place] Disposition staff 09/27/2016 10:17 PM

## 2016-09-27 NOTE — ED Provider Notes (Signed)
[facility] DEPT Provider Note   CSN: [id number] Arrival date & time: 09/27/16  1823     History   Chief Complaint Chief Complaint  Patient presents with  . Depression  . Anxiety    HPI Audrey Meyer is a 60 y.o. female. She presents for evaluation of depression, confusion and concern for developing suicidal ideation. She reports onset of the symptoms several days ago despite taking her usual prescription medication. She saw her PCP about 10 days ago and her thyroid and potassium medications were adjusted. She is due to follow-up with the doctor again in about 3 months. She denies suicide attempt, homicidal ideation, fever, chills, nausea, vomiting, chest pain, or dizziness. She's taking her usual medications. There are no other known modifying factors.   HPI  Past Medical History:  Diagnosis Date  . Acid reflux   . Breast cancer ([place])   . COPD (chronic obstructive pulmonary disease) ([place])   . High cholesterol   . Hypertension   . Hypothyroid   . Schizoaffective disorder     Patient Active Problem List   Diagnosis Date Noted  . Hyperthyroidism 04/30/2016  . Severe episode of recurrent major depressive disorder, without psychotic features ([place])   . Major depressive disorder, recurrent episode ([place]) 04/28/2016  . Chronic obstructive pulmonary disease ([place])   . Overdose 03/07/2016  . Hypokalemia 03/07/2016  . Hypothyroidism 03/07/2016  . Schizoaffective disorder ([place]) 03/07/2016  . Essential hypertension 03/07/2016  . COPD exacerbation ([place]) 03/07/2016  . Tobacco abuse 03/07/2016  . Pneumonia 03/06/2016  . CAP (community acquired pneumonia) 03/06/2016    Past Surgical History:  Procedure Laterality Date  . APPENDECTOMY    . MASTECTOMY, PARTIAL    . TUBAL LIGATION      OB History    No data available       Home Medications    Prior to Admission medications   Medication Sig Start Date End Date Taking? Authorizing Provider  albuterol (PROVENTIL  HFA;VENTOLIN HFA) 108 (90 BASE) MCG/ACT inhaler Inhale 2 puffs into the lungs every 6 (six) hours as needed. For shortness of breath   Yes Historical Provider, MD  atenolol-chlorthalidone (TENORETIC) 50-25 MG tablet Take 1 tablet by mouth 2 (two) times daily. 09/19/16  Yes Historical Provider, MD  atorvastatin (LIPITOR) 20 MG tablet Take 1 tablet (20 mg total) by mouth at bedtime. 05/05/16  Yes Benjamine Mola, FNP  diazepam (VALIUM) 2 MG tablet Take 1 tablet (2 mg total) by mouth every 12 (twelve) hours as needed for anxiety. Patient taking differently: Take 2 mg by mouth at bedtime as needed for anxiety (takes as needed at night to sleep - takes in addition to (5mg ) twice daily).  05/05/16  Yes Benjamine Mola, FNP  diazepam (VALIUM) 5 MG tablet Take 1 tablet (5 mg total) by mouth 2 (two) times daily. 05/05/16  Yes Benjamine Mola, FNP  gabapentin (NEURONTIN) 100 MG capsule Take 1 capsule (100 mg total) by mouth 3 (three) times daily. Patient taking differently: Take 200 mg by mouth 4 (four) times daily.  05/05/16  Yes Benjamine Mola, FNP  hydrALAZINE (APRESOLINE) 50 MG tablet Take 1.5 tablets (75 mg total) by mouth 3 (three) times daily. 05/05/16  Yes Benjamine Mola, FNP  hydrOXYzine (VISTARIL) 25 MG capsule Take 1 capsule (25 mg total) by mouth 3 (three) times daily as needed for anxiety. 05/05/16  Yes Benjamine Mola, FNP  potassium chloride SA (K-DUR,KLOR-CON) 20 MEQ tablet Take 1 tablet (20  mEq total) by mouth 2 (two) times daily. Patient taking differently: Take 20 mEq by mouth 3 (three) times daily. Patient takes 2 in the morning and 1 in the evening 05/05/16  Yes Benjamine Mola, FNP  sertraline (ZOLOFT) 100 MG tablet Take 1 tablet by mouth daily. 08/30/16  Yes Historical Provider, MD  spironolactone (ALDACTONE) 25 MG tablet Take 1 tablet (25 mg total) by mouth daily. 05/05/16  Yes Benjamine Mola, FNP  SYNTHROID 88 MCG tablet Take 1 tablet by mouth daily. 09/20/16  Yes Historical Provider, MD  traZODone (DESYREL)  150 MG tablet Take 0.5 tablets (75 mg total) by mouth at bedtime as needed for sleep. Patient taking differently: Take 150 mg by mouth at bedtime as needed for sleep.  05/05/16  Yes Benjamine Mola, FNP  atenolol (TENORMIN) 50 MG tablet Take 1 tablet (50 mg total) by mouth 2 (two) times daily. Patient not taking: Reported on 09/27/2016 05/05/16   Benjamine Mola, FNP  levothyroxine (SYNTHROID, LEVOTHROID) 75 MCG tablet Take 1 tablet (75 mcg total) by mouth daily. Patient not taking: Reported on 09/27/2016 05/06/16   Benjamine Mola, FNP  PARoxetine (PAXIL) 40 MG tablet Take 1 tablet by mouth every morning. 08/07/16   Historical Provider, MD  sertraline (ZOLOFT) 25 MG tablet Take 3 tablets (75 mg total) by mouth daily. Patient not taking: Reported on 09/27/2016 05/05/16   Benjamine Mola, FNP    Family History Family History  Problem Relation Age of Onset  . Heart attack Father   . Cancer Other     Social History Social History  Substance Use Topics  . Smoking status: Current Every Day Smoker    Packs/day: 0.00    Years: 2.00    Types: Cigars  . Smokeless tobacco: Never Used  . Alcohol use No     Allergies   Budesonide   Review of Systems Review of Systems  All other systems reviewed and are negative.    Physical Exam Updated Vital Signs BP 121/82 (BP Location: Right Arm)   Pulse 67   Temp 98.3 F (36.8 C) (Oral)   Resp 18   Ht 5\' 2"  (1.575 m)   Wt 140 lb (63.5 kg)   SpO2 96%   BMI 25.61 kg/m   Physical Exam  Constitutional: She is oriented to person, place, and time. She appears well-developed and well-nourished.  HENT:  Head: Normocephalic and atraumatic.  Right Ear: External ear normal.  Left Ear: External ear normal.  Mouth/Throat: Oropharynx is clear and moist.  Eyes: Conjunctivae and EOM are normal. Pupils are equal, round, and reactive to light.  Neck: Normal range of motion and phonation normal. Neck supple.  Cardiovascular: Normal rate.   Pulmonary/Chest:  Effort normal. She exhibits no tenderness.  Musculoskeletal: Normal range of motion.  Neurological: She is alert and oriented to person, place, and time. She exhibits normal muscle tone.  Skin: Skin is warm and dry.  Psychiatric: She has a normal mood and affect. Her behavior is normal. Judgment and thought content normal.  Nursing note and vitals reviewed.    ED Treatments / Results  Labs (all labs ordered are listed, but only abnormal results are displayed) Labs Reviewed  COMPREHENSIVE METABOLIC PANEL - Abnormal; Notable for the following:       Result Value   Total Protein 8.5 (*)    Albumin 5.2 (*)    All other components within normal limits  RAPID URINE DRUG SCREEN, HOSP PERFORMED - Abnormal;  Notable for the following:    Benzodiazepines POSITIVE (*)    All other components within normal limits  ACETAMINOPHEN LEVEL - Abnormal; Notable for the following:    Acetaminophen (Tylenol), Serum <10 (*)    All other components within normal limits  CBC  ETHANOL  SALICYLATE LEVEL    EKG  EKG Interpretation None       Radiology No results found.  Procedures Procedures (including critical care time)  Medications Ordered in ED Medications  acetaminophen (TYLENOL) tablet 650 mg (not administered)  ibuprofen (ADVIL,MOTRIN) tablet 600 mg (not administered)  zolpidem (AMBIEN) tablet 5 mg (not administered)  nicotine (NICODERM CQ - dosed in mg/24 hours) patch 21 mg (not administered)  ondansetron (ZOFRAN) tablet 4 mg (not administered)  alum & mag hydroxide-simeth (MAALOX/MYLANTA) 200-200-20 MG/5ML suspension 30 mL (not administered)  atenolol (TENORMIN) tablet 50 mg (not administered)  atenolol-chlorthalidone (TENORETIC) 50-25 MG per tablet 1 tablet (not administered)  atorvastatin (LIPITOR) tablet 20 mg (not administered)  diazepam (VALIUM) tablet 5 mg (not administered)  gabapentin (NEURONTIN) capsule 100 mg (not administered)  hydrALAZINE (APRESOLINE) tablet 75 mg (not  administered)  hydrOXYzine (VISTARIL) capsule 25 mg (not administered)  levothyroxine (SYNTHROID, LEVOTHROID) tablet 75 mcg (not administered)  PARoxetine (PAXIL) tablet 40 mg (not administered)  potassium chloride SA (K-DUR,KLOR-CON) CR tablet 20 mEq (not administered)  sertraline (ZOLOFT) tablet 100 mg (not administered)  spironolactone (ALDACTONE) tablet 25 mg (not administered)  traZODone (DESYREL) tablet 75 mg (not administered)     Initial Impression / Assessment and Plan / ED Course  I have reviewed the triage vital signs and the nursing notes.  Pertinent labs & imaging results that were available during my care of the patient were reviewed by me and considered in my medical decision making (see chart for details).  Clinical Course  Comment By Time  At this time, she is medically cleared for treatment by psychiatry [name], MD 10/24 2155  Discussed with TTS. She has been accepted at [facility], pending an open bed, "in the morning." [name], MD 10/24 2236    Medications  acetaminophen (TYLENOL) tablet 650 mg (not administered)  ibuprofen (ADVIL,MOTRIN) tablet 600 mg (not administered)  zolpidem (AMBIEN) tablet 5 mg (not administered)  nicotine (NICODERM CQ - dosed in mg/24 hours) patch 21 mg (not administered)  ondansetron (ZOFRAN) tablet 4 mg (not administered)  alum & mag hydroxide-simeth (MAALOX/MYLANTA) 200-200-20 MG/5ML suspension 30 mL (not administered)  atenolol (TENORMIN) tablet 50 mg (not administered)  atenolol-chlorthalidone (TENORETIC) 50-25 MG per tablet 1 tablet (not administered)  atorvastatin (LIPITOR) tablet 20 mg (not administered)  diazepam (VALIUM) tablet 5 mg (not administered)  gabapentin (NEURONTIN) capsule 100 mg (not administered)  hydrALAZINE (APRESOLINE) tablet 75 mg (not administered)  hydrOXYzine (VISTARIL) capsule 25 mg (not administered)  levothyroxine (SYNTHROID, LEVOTHROID) tablet 75 mcg (not administered)  PARoxetine (PAXIL)  tablet 40 mg (not administered)  potassium chloride SA (K-DUR,KLOR-CON) CR tablet 20 mEq (not administered)  sertraline (ZOLOFT) tablet 100 mg (not administered)  spironolactone (ALDACTONE) tablet 25 mg (not administered)  traZODone (DESYREL) tablet 75 mg (not administered)    Patient Vitals for the past 24 hrs:  BP Temp Temp src Pulse Resp SpO2 Height Weight  09/27/16 1840 121/82 98.3 F (36.8 C) Oral 67 18 96 % 5\' 2"  (1.575 m) 140 lb (63.5 kg)    TTS consultation   Final Clinical Impressions(s) / ED Diagnoses   Final diagnoses:  Depression, unspecified depression type  Anxiety    Depression  with anxiety. History similar in the past, which led to suicidal ideation. Today she does not have any active suicidal plan, or persistent thoughts of suicide.  Nursing Notes Reviewed/ Care Coordinated Applicable Imaging Reviewed Interpretation of Laboratory Data incorporated into ED treatment  Plan: as per TTS in conjunction with ongoing provider team  New Prescriptions New Prescriptions   No medications on file     [name], MD 09/27/16 2246

## 2016-09-27 NOTE — ED Notes (Signed)
POC discussed with patient.  States she "only wants to go to behavioral health."  Denies SI. States that she wants to go home and wait for her appt on Friday rather than go to [facility].  Instructed to inform provider in the am.

## 2016-09-27 NOTE — ED Notes (Signed)
Called [facility] to look for personal items in locker.

## 2016-09-27 NOTE — ED Triage Notes (Signed)
Pt brought over by [facility] for medical clearance.  C/o increasing depression, anxiety, and confusion x 1 month.  Pt reports taking all medications as prescribed.  Sts she has not seen her psychiatrist x 3-4 months.  Pt has an appointment at [facility] in 3 days, but sts "I don't think I can wait that long."  Denies SI, but sts "I'm afraid it's going to come to that.  I'm to the point that I don't care what happens to me."  Denies HI and AVH.         Pt reports "I thought my thyroid and potassium were low.  I went to my PCP and he said they were low."  Pt reports that medications were adjusted to address these issues.

## 2016-09-27 NOTE — ED Notes (Signed)
3 handbags added to locker 28.

## 2016-09-28 DIAGNOSIS — F17210 Nicotine dependence, cigarettes, uncomplicated: Secondary | ICD-10-CM

## 2016-09-28 DIAGNOSIS — F331 Major depressive disorder, recurrent, moderate: Secondary | ICD-10-CM | POA: Diagnosis not present

## 2016-09-28 DIAGNOSIS — Z808 Family history of malignant neoplasm of other organs or systems: Secondary | ICD-10-CM | POA: Diagnosis not present

## 2016-09-28 DIAGNOSIS — Z8249 Family history of ischemic heart disease and other diseases of the circulatory system: Secondary | ICD-10-CM

## 2016-09-28 DIAGNOSIS — Z79899 Other long term (current) drug therapy: Secondary | ICD-10-CM

## 2016-09-28 MED ORDER — LOPERAMIDE HCL 2 MG PO CAPS
4.0000 mg | ORAL_CAPSULE | Freq: Once | ORAL | Status: AC
  Administered 2016-09-28: 4 mg via ORAL
  Filled 2016-09-28: qty 2

## 2016-09-28 NOTE — ED Notes (Signed)
Pt requesting to have her thyroid levels checked, sts that is the main reason why she doesn't feel good. She also sts her anxiety is acting up since her potassium is very low. Reassured pt that EDP is aware of her lab results, and if there were any critical values they would address those immediately.

## 2016-09-28 NOTE — BH Assessment (Signed)
[place] Assessment Progress Note  Per Corena Pilgrim, MD, this pt does not require psychiatric hospitalization at this time.  Pt is to be discharged from [facility] with referral information for [facility].  This has been included in pt's discharge instructions.  Pt's nurse has been notified.  [facility] has also been called to inform them that pt will no longer need a bed at their facility.  Jalene Mullet, [place] Triage Specialist [phone]

## 2016-09-28 NOTE — ED Notes (Signed)
MD at bedside. 

## 2016-09-28 NOTE — BHH Suicide Risk Assessment (Signed)
Suicide Risk Assessment  Discharge Assessment   [facility] Discharge Suicide Risk Assessment   Principal Problem: Major depressive disorder, recurrent episode, moderate ([place]) Discharge Diagnoses:  Patient Active Problem List   Diagnosis Date Noted  . Major depressive disorder, recurrent episode, moderate ([place]) [F33.1] 04/28/2016    Priority: High  . Hyperthyroidism [E05.90] 04/30/2016  . Chronic obstructive pulmonary disease ([place]) [J44.9]   . Overdose [T50.901A] 03/07/2016  . Hypokalemia [E87.6] 03/07/2016  . Hypothyroidism [E03.9] 03/07/2016  . Essential hypertension [I10] 03/07/2016  . COPD exacerbation ([place]) [J44.1] 03/07/2016  . Tobacco abuse [Z72.0] 03/07/2016  . Pneumonia [J18.9] 03/06/2016  . CAP (community acquired pneumonia) [J18.9] 03/06/2016    Total Time spent with patient: 45 minutes   Musculoskeletal: Strength & Muscle Tone: within normal limits Gait & Station: normal Patient leans: N/A  Psychiatric Specialty Exam: Physical Exam  Constitutional: She is oriented to person, place, and time. She appears well-developed and well-nourished.  HENT:  Head: Normocephalic.  Neck: Normal range of motion.  Respiratory: Effort normal.  Musculoskeletal: Normal range of motion.  Neurological: She is alert and oriented to person, place, and time.  Skin: Skin is warm and dry.  Psychiatric: Her speech is normal and behavior is normal. Judgment and thought content normal. Her mood appears anxious. Cognition and memory are normal. She exhibits a depressed mood.    Review of Systems  Constitutional: Negative.   HENT: Negative.   Eyes: Negative.   Respiratory: Negative.   Cardiovascular: Negative.   Gastrointestinal: Negative.   Genitourinary: Negative.   Musculoskeletal: Negative.   Skin: Negative.   Neurological: Negative.   Endo/Heme/Allergies: Negative.   Psychiatric/Behavioral: Positive for depression. The patient is nervous/anxious.     Blood pressure 102/61, pulse 60,  temperature 98.2 F (36.8 C), temperature source Oral, resp. rate 16, height 5\' 2"  (1.575 m), weight 63.5 kg (140 lb), SpO2 93 %.Body mass index is 25.61 kg/m.  General Appearance: Casual  Eye Contact:  Good  Speech:  Normal Rate  Volume:  Normal  Mood:  Anxious and Depressed  Affect:  Congruent  Thought Process:  Coherent and Descriptions of Associations: Intact  Orientation:  Full (Time, Place, and Person)  Thought Content:  WDL  Suicidal Thoughts:  No  Homicidal Thoughts:  No  Memory:  Immediate;   Good Recent;   Good Remote;   Good  Judgement:  Good  Insight:  Good  Psychomotor Activity:  Normal  Concentration:  Concentration: Good and Attention Span: Good  Recall:  Good  Fund of Knowledge:  Good  Language:  Good  Akathisia:  No  Handed:  Right  AIMS (if indicated):     Assets:  Agricultural consultant Housing Intimacy Leisure Time Physical Health Resilience Social Support Transportation  ADL's:  Intact  Cognition:  WNL  Sleep:      Mental Status Per Nursing Assessment::   On Admission:   depression and anxiety  Demographic Factors:  Caucasian  Loss Factors: NA  Historical Factors: NA  Risk Reduction Factors:   Sense of responsibility to family, Living with another person, especially a relative, Positive social support and Positive therapeutic relationship  Continued Clinical Symptoms:  Depression and anxiety, mild  Cognitive Features That Contribute To Risk:  None    Suicide Risk:  Minimal: No identifiable suicidal ideation.  Patients presenting with no risk factors but with morbid ruminations; may be classified as minimal risk based on the severity of the depressive symptoms    Plan Of Care/Follow-up recommendations:  Activity:  as tolerated Diet:  heart healthy diet  Aliece Honold, NP 09/28/2016, 10:24 AM

## 2016-09-28 NOTE — BHH Counselor (Signed)
Per Caita's (SW) request, called [facility] Admissions [phone]) to request that they refax her consent form.  Talked to Lattie Haw who stated she would refax form to [phone] as soon as possible.  Faylene Kurtz, MS, CRC, [place] Triage Specialist [facility]

## 2016-09-28 NOTE — ED Notes (Signed)
Pt is requesting anti-diarrheals, will notify EDP.

## 2016-09-28 NOTE — ED Notes (Signed)
Pt reports she takes many of her medications differently than what is ordered, she claims her psychiatrist told her to double on some of her meds. Will notify EDP

## 2016-09-28 NOTE — Consult Note (Signed)
[facility] Consult   Reason for Consult:  Depression and anxiety Referring Physician:  EDP Patient Identification: Audrey Meyer MRN:  [id number] Principal Diagnosis: Major depressive disorder, recurrent episode, moderate ([place]) Diagnosis:   Patient Active Problem List   Diagnosis Date Noted  . Major depressive disorder, recurrent episode, moderate ([place]) [F33.1] 04/28/2016    Priority: High  . Hyperthyroidism [E05.90] 04/30/2016  . Chronic obstructive pulmonary disease ([place]) [J44.9]   . Overdose [T50.901A] 03/07/2016  . Hypokalemia [E87.6] 03/07/2016  . Hypothyroidism [E03.9] 03/07/2016  . Essential hypertension [I10] 03/07/2016  . COPD exacerbation ([place]) [J44.1] 03/07/2016  . Tobacco abuse [Z72.0] 03/07/2016  . Pneumonia [J18.9] 03/06/2016  . CAP (community acquired pneumonia) [J18.9] 03/06/2016    Total Time spent with patient: 45 minutes  Subjective:   Audrey Meyer is a 60 y.o. female patient does not warrant admission.  HPI:  60 yo female who presented to the ED with an increase in depression and anxiety.  Denies suicidal/homicidal ideations, hallucinations, and alcohol/drug abuse this  Morning.  She wants to discharge and follow up with Dr. Josph Macho at [facility] on Friday (her appointment).  Audrey Meyer got a bed at [facility] geriatric unit but did not want to go nor did she feel she needed to go.  Past Psychiatric History: depression, anxiety  Risk to Self: Is patient at risk for suicide?: No, but patient needs Medical Clearance Risk to Others:  none Prior Inpatient Therapy:  [facility] Prior Outpatient Therapy:  [facility]  Past Medical History:  Past Medical History:  Diagnosis Date  . Acid reflux   . Breast cancer ([place])   . COPD (chronic obstructive pulmonary disease) ([place])   . High cholesterol   . Hypertension   . Hypothyroid   . Schizoaffective disorder     Past Surgical History:  Procedure Laterality Date  . APPENDECTOMY    . MASTECTOMY, PARTIAL    .  TUBAL LIGATION     Family History:  Family History  Problem Relation Age of Onset  . Heart attack Father   . Cancer Other    Family Psychiatric  History: none Social History:  History  Alcohol Use No     History  Drug Use No    Social History   Social History  . Marital status: Legally Separated    Spouse name: N/A  . Number of children: N/A  . Years of education: N/A   Social History Main Topics  . Smoking status: Current Every Day Smoker    Packs/day: 0.00    Years: 2.00    Types: Cigars  . Smokeless tobacco: Never Used  . Alcohol use No  . Drug use: No  . Sexual activity: No   Other Topics Concern  . None   Social History Narrative  . None   Additional Social History:    Allergies:   Allergies  Allergen Reactions  . Budesonide Shortness Of Breath    Labs:  Results for orders placed or performed during the hospital encounter of 09/27/16 (from the past 48 hour(s))  Rapid urine drug screen (hospital performed)     Status: Abnormal   Collection Time: 09/27/16  7:00 PM  Result Value Ref Range   Opiates NONE DETECTED NONE DETECTED   Cocaine NONE DETECTED NONE DETECTED   Benzodiazepines POSITIVE (A) NONE DETECTED   Amphetamines NONE DETECTED NONE DETECTED   Tetrahydrocannabinol NONE DETECTED NONE DETECTED   Barbiturates NONE DETECTED NONE DETECTED    Comment:  DRUG SCREEN FOR MEDICAL PURPOSES ONLY.  IF CONFIRMATION IS NEEDED FOR ANY PURPOSE, NOTIFY LAB WITHIN 5 DAYS.        LOWEST DETECTABLE LIMITS FOR URINE DRUG SCREEN Drug Class       Cutoff (ng/mL) Amphetamine      1000 Barbiturate      200 Benzodiazepine   025 Tricyclics       852 Opiates          300 Cocaine          300 THC              50   Comprehensive metabolic panel     Status: Abnormal   Collection Time: 09/27/16  7:21 PM  Result Value Ref Range   Sodium 140 135 - 145 mmol/L   Potassium 3.5 3.5 - 5.1 mmol/L   Chloride 106 101 - 111 mmol/L   CO2 24 22 - 32 mmol/L    Glucose, Bld 96 65 - 99 mg/dL   BUN 16 6 - 20 mg/dL   Creatinine, Ser 0.97 0.44 - 1.00 mg/dL   Calcium 9.7 8.9 - 10.3 mg/dL   Total Protein 8.5 (H) 6.5 - 8.1 g/dL   Albumin 5.2 (H) 3.5 - 5.0 g/dL   AST 29 15 - 41 U/L   ALT 17 14 - 54 U/L   Alkaline Phosphatase 68 38 - 126 U/L   Total Bilirubin 0.8 0.3 - 1.2 mg/dL   GFR calc non Af Amer >60 >60 mL/min   GFR calc Af Amer >60 >60 mL/min    Comment: (NOTE) The eGFR has been calculated using the CKD EPI equation. This calculation has not been validated in all clinical situations. eGFR's persistently <60 mL/min signify possible Chronic Kidney Disease.    Anion gap 10 5 - 15  cbc     Status: None   Collection Time: 09/27/16  7:21 PM  Result Value Ref Range   WBC 10.4 4.0 - 10.5 K/uL   RBC 4.50 3.87 - 5.11 MIL/uL   Hemoglobin 13.6 12.0 - 15.0 g/dL   HCT 39.2 36.0 - 46.0 %   MCV 87.1 78.0 - 100.0 fL   MCH 30.2 26.0 - 34.0 pg   MCHC 34.7 30.0 - 36.0 g/dL   RDW 14.5 11.5 - 15.5 %   Platelets 267 150 - 400 K/uL  Acetaminophen level     Status: Abnormal   Collection Time: 09/27/16  8:00 PM  Result Value Ref Range   Acetaminophen (Tylenol), Serum <10 (L) 10 - 30 ug/mL    Comment:        THERAPEUTIC CONCENTRATIONS VARY SIGNIFICANTLY. A RANGE OF 10-30 ug/mL MAY BE AN EFFECTIVE CONCENTRATION FOR MANY PATIENTS. HOWEVER, SOME ARE BEST TREATED AT CONCENTRATIONS OUTSIDE THIS RANGE. ACETAMINOPHEN CONCENTRATIONS >150 ug/mL AT 4 HOURS AFTER INGESTION AND >50 ug/mL AT 12 HOURS AFTER INGESTION ARE OFTEN ASSOCIATED WITH TOXIC REACTIONS.   Ethanol     Status: None   Collection Time: 09/27/16  8:00 PM  Result Value Ref Range   Alcohol, Ethyl (B) <5 <5 mg/dL    Comment:        LOWEST DETECTABLE LIMIT FOR SERUM ALCOHOL IS 5 mg/dL FOR MEDICAL PURPOSES ONLY   Salicylate level     Status: None   Collection Time: 09/27/16  8:00 PM  Result Value Ref Range   Salicylate Lvl <7.7 2.8 - 30.0 mg/dL    Current Facility-Administered Medications   Medication Dose Route Frequency Provider Last Rate  Last Dose  . acetaminophen (TYLENOL) tablet 650 mg  650 mg Oral Q4H PRN [name], MD      . alum & mag hydroxide-simeth (MAALOX/MYLANTA) 200-200-20 MG/5ML suspension 30 mL  30 mL Oral PRN [name], MD      . atenolol (TENORMIN) tablet 50 mg  50 mg Oral BID [name], MD   50 mg at 09/28/16 0925  . atorvastatin (LIPITOR) tablet 20 mg  20 mg Oral QHS [name], MD   20 mg at 09/28/16 0044  . chlorthalidone (HYGROTON) tablet 25 mg  25 mg Oral BID [name], MD   25 mg at 09/28/16 0925  . diazepam (VALIUM) tablet 5 mg  5 mg Oral BID [name], MD   5 mg at 09/28/16 0931  . gabapentin (NEURONTIN) capsule 100 mg  100 mg Oral TID [name], MD   100 mg at 09/28/16 5809  . hydrALAZINE (APRESOLINE) tablet 75 mg  75 mg Oral TID [name], MD   75 mg at 09/28/16 9833  . hydrOXYzine (ATARAX/VISTARIL) tablet 25 mg  25 mg Oral TID PRN [name], MD      . ibuprofen (ADVIL,MOTRIN) tablet 600 mg  600 mg Oral Q8H PRN [name], MD      . levothyroxine (SYNTHROID, LEVOTHROID) tablet 88 mcg  88 mcg Oral QAC breakfast [name], MD   88 mcg at 09/28/16 8250  . nicotine (NICODERM CQ - dosed in mg/24 hours) patch 21 mg  21 mg Transdermal Daily PRN [name], MD      . ondansetron [facility]) tablet 4 mg  4 mg Oral Q8H PRN [name], MD   4 mg at 09/28/16 0617  . PARoxetine (PAXIL) tablet 40 mg  40 mg Oral q morning - 10a [name], MD   40 mg at 09/28/16 5397  . potassium chloride SA (K-DUR,KLOR-CON) CR tablet 20 mEq  20 mEq Oral BID [name], MD   20 mEq at 09/28/16 0929  . sertraline (ZOLOFT) tablet 100 mg  100 mg Oral Daily [name], MD   100 mg at 09/28/16 0926  . spironolactone (ALDACTONE) tablet 25 mg  25 mg Oral Daily [name], MD   25 mg at 09/28/16 0926  . traZODone (DESYREL) tablet 75 mg  75 mg Oral QHS PRN [name], MD   75 mg at 09/28/16 0045   Current Outpatient Prescriptions   Medication Sig Dispense Refill  . albuterol (PROVENTIL HFA;VENTOLIN HFA) 108 (90 BASE) MCG/ACT inhaler Inhale 2 puffs into the lungs every 6 (six) hours as needed. For shortness of breath    . atenolol-chlorthalidone (TENORETIC) 50-25 MG tablet Take 1 tablet by mouth 2 (two) times daily.    Marland Kitchen atorvastatin (LIPITOR) 20 MG tablet Take 1 tablet (20 mg total) by mouth at bedtime.  0  . diazepam (VALIUM) 2 MG tablet Take 1 tablet (2 mg total) by mouth every 12 (twelve) hours as needed for anxiety. (Patient taking differently: Take 2 mg by mouth at bedtime as needed for anxiety (takes as needed at night to sleep - takes in addition to (77m) twice daily). ) 14 tablet 0  . diazepam (VALIUM) 5 MG tablet Take 1 tablet (5 mg total) by mouth 2 (two) times daily. 14 tablet 0  . gabapentin (NEURONTIN) 100 MG capsule Take 1 capsule (100 mg total) by mouth 3 (three) times daily. (Patient taking differently: Take 200 mg by mouth 4 (four) times daily. ) 90 capsule 0  .  hydrALAZINE (APRESOLINE) 50 MG tablet Take 1.5 tablets (75 mg total) by mouth 3 (three) times daily. 1 tablet 0  . hydrOXYzine (VISTARIL) 25 MG capsule Take 1 capsule (25 mg total) by mouth 3 (three) times daily as needed for anxiety. 30 capsule 0  . PARoxetine (PAXIL) 40 MG tablet Take 1 tablet by mouth every morning.    . potassium chloride SA (K-DUR,KLOR-CON) 20 MEQ tablet Take 1 tablet (20 mEq total) by mouth 2 (two) times daily. (Patient taking differently: Take 20 mEq by mouth 3 (three) times daily. Patient takes 2 in the morning and 1 in the evening)    . sertraline (ZOLOFT) 100 MG tablet Take 1 tablet by mouth daily.    Marland Kitchen spironolactone (ALDACTONE) 25 MG tablet Take 1 tablet (25 mg total) by mouth daily. 1 tablet 0  . SYNTHROID 88 MCG tablet Take 1 tablet by mouth daily.    . traZODone (DESYREL) 150 MG tablet Take 0.5 tablets (75 mg total) by mouth at bedtime as needed for sleep. (Patient taking differently: Take 75-150 mg by mouth at bedtime as  needed for sleep. ) 15 tablet 0  . atenolol (TENORMIN) 50 MG tablet Take 1 tablet (50 mg total) by mouth 2 (two) times daily. (Patient not taking: Reported on 09/27/2016) 28 tablet 0  . sertraline (ZOLOFT) 25 MG tablet Take 3 tablets (75 mg total) by mouth daily. (Patient not taking: Reported on 09/27/2016) 90 tablet 0    Musculoskeletal: Strength & Muscle Tone: within normal limits Gait & Station: normal Patient leans: N/A  Psychiatric Specialty Exam: Physical Exam  Constitutional: She is oriented to person, place, and time. She appears well-developed and well-nourished.  HENT:  Head: Normocephalic.  Neck: Normal range of motion.  Respiratory: Effort normal.  Musculoskeletal: Normal range of motion.  Neurological: She is alert and oriented to person, place, and time.  Skin: Skin is warm and dry.  Psychiatric: Her speech is normal and behavior is normal. Judgment and thought content normal. Her mood appears anxious. Cognition and memory are normal. She exhibits a depressed mood.    Review of Systems  Constitutional: Negative.   HENT: Negative.   Eyes: Negative.   Respiratory: Negative.   Cardiovascular: Negative.   Gastrointestinal: Negative.   Genitourinary: Negative.   Musculoskeletal: Negative.   Skin: Negative.   Neurological: Negative.   Endo/Heme/Allergies: Negative.   Psychiatric/Behavioral: Positive for depression. The patient is nervous/anxious.     Blood pressure 102/61, pulse 60, temperature 98.2 F (36.8 C), temperature source Oral, resp. rate 16, height _0  (1.575 m), weight 63.5 kg (140 lb), SpO2 93 %.Body mass index is 25.61 kg/m.  General Appearance: Casual  Eye Contact:  Good  Speech:  Normal Rate  Volume:  Normal  Mood:  Anxious and Depressed  Affect:  Congruent  Thought Process:  Coherent and Descriptions of Associations: Intact  Orientation:  Full (Time, Place, and Person)  Thought Content:  WDL  Suicidal Thoughts:  No  Homicidal Thoughts:  No   Memory:  Immediate;   Good Recent;   Good Remote;   Good  Judgement:  Good  Insight:  Good  Psychomotor Activity:  Normal  Concentration:  Concentration: Good and Attention Span: Good  Recall:  Good  Fund of Knowledge:  Good  Language:  Good  Akathisia:  No  Handed:  Right  AIMS (if indicated):     Assets:  Agricultural consultant Housing Intimacy Leisure Time Physical Health Resilience Social Support Transportation  ADL's:  Intact  Cognition:  WNL  Sleep:        Treatment Plan Summary: Daily contact with patient to assess and evaluate symptoms and progress in treatment, Medication management and Plan major depressive disorder, recurrent, moderate:  -Crisis stabilization -Medication management:  Medical medications along with Paxil 40 mg daily for depression, Zoloft 100 mg daily for depression, Vistaril 25 mg every six hours PRN anxiety, and Trazodone 75 mg at bedtime PRN -Individual counseling  Disposition: No evidence of imminent risk to self or others at present.    Waylan Boga, NP 09/28/2016 9:53 AM  Patient seen face-to-face for psychiatric evaluation, chart reviewed and case discussed with the physician extender and developed treatment plan. Reviewed the information documented and agree with the treatment plan. Corena Pilgrim, MD

## 2016-09-28 NOTE — Discharge Instructions (Signed)
For your ongoing mental health needs, you are advised to follow up with [facility].  If you do not currently have an appointment, new and returning patients are seen at their walk-in clinic.  Walk-in hours are Monday - Friday from 8:00 am - 3:00 pm.  Walk-in patients are seen on a first come, first served basis.  Try to arrive as early as possible for he best chance of being seen the same day: ° °     [facility] °     [address] °     [place], [place] [address] °     [phone] °

## 2016-10-12 IMAGING — CR DG CHEST 2V
2 series · 2 of 2 positions shown · non-contrast
Comparison: 03/25/2016 and 03/06/2016

CLINICAL DATA: Anxiety, medical clearance

EXAM:
CHEST  2 VIEW

[w chest lat]
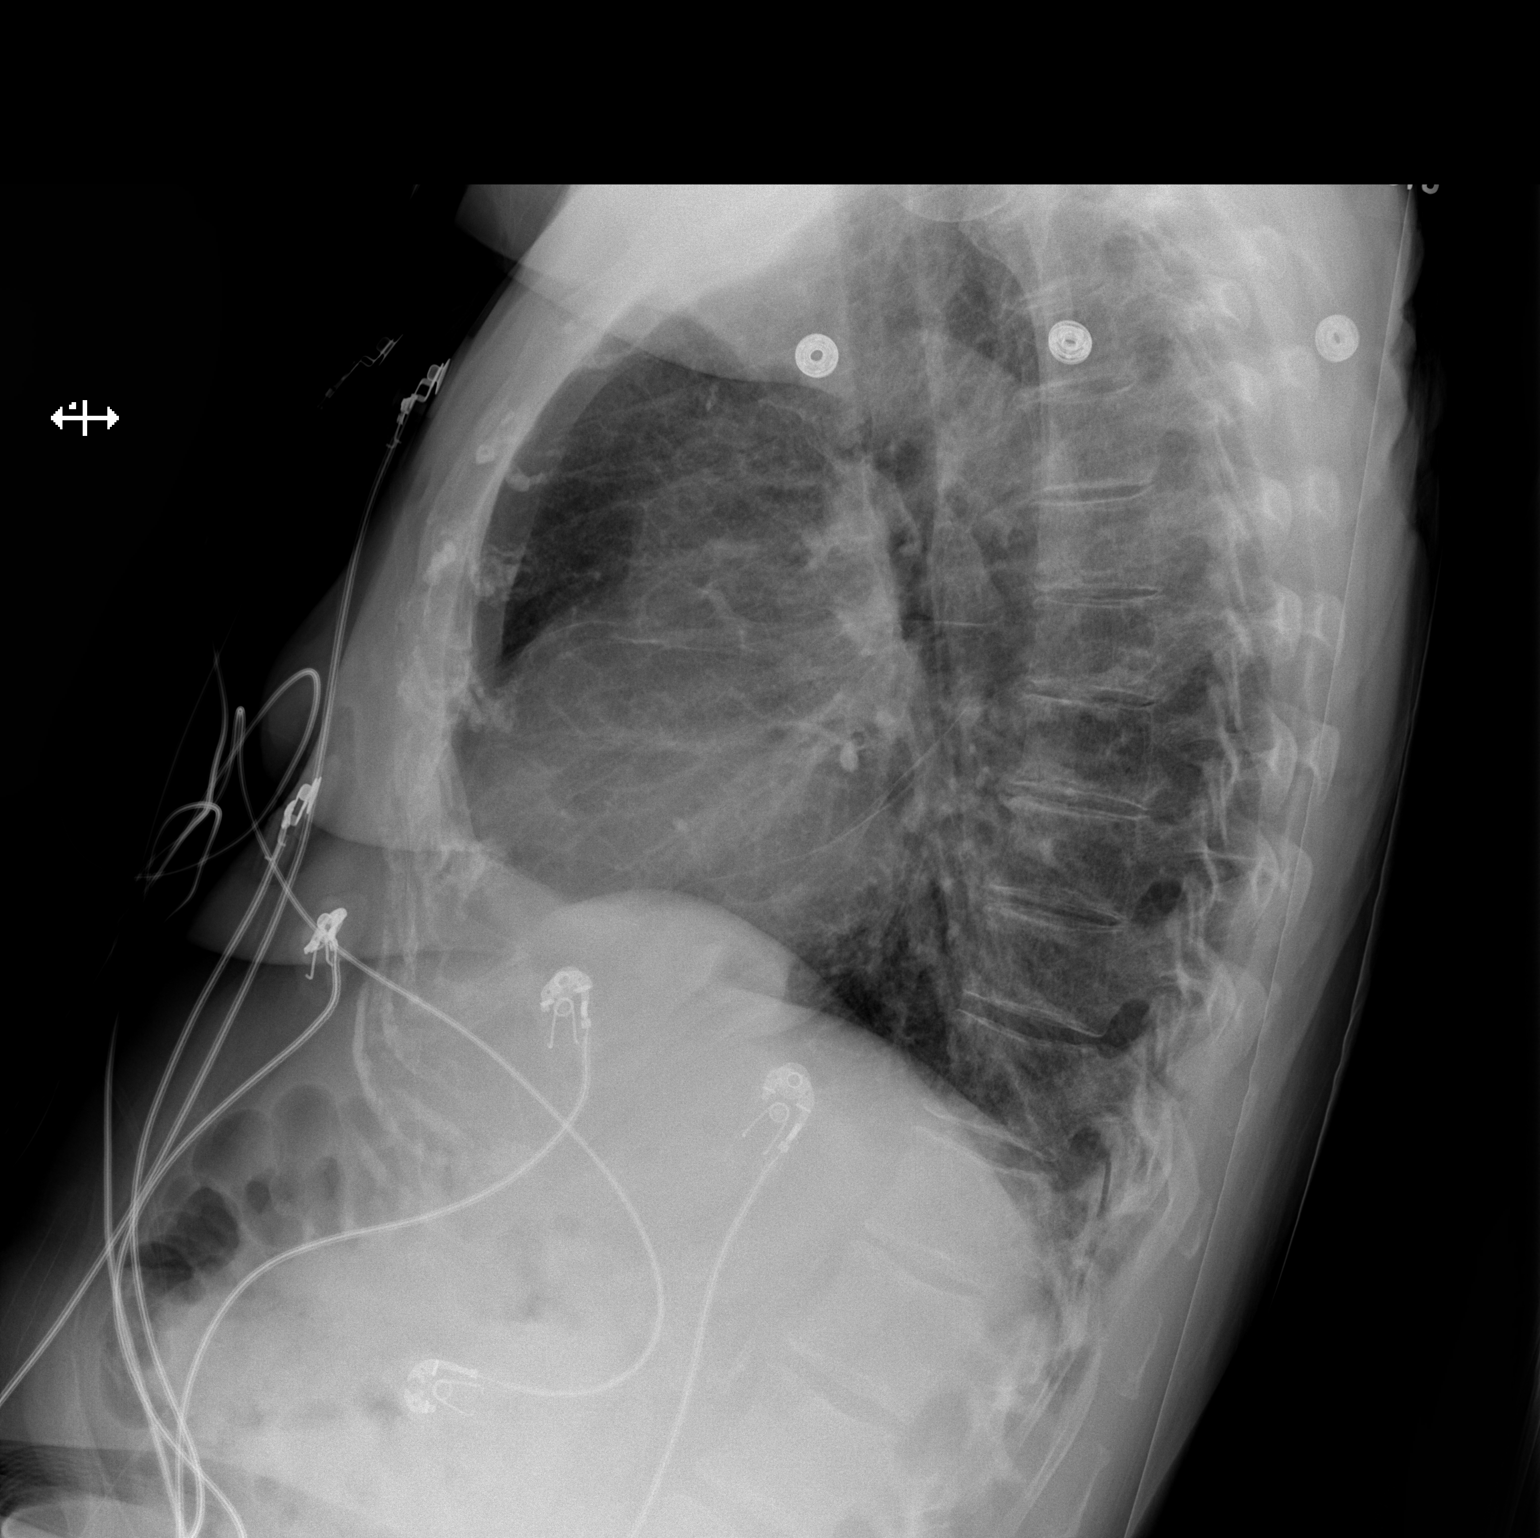

[x chest ap]
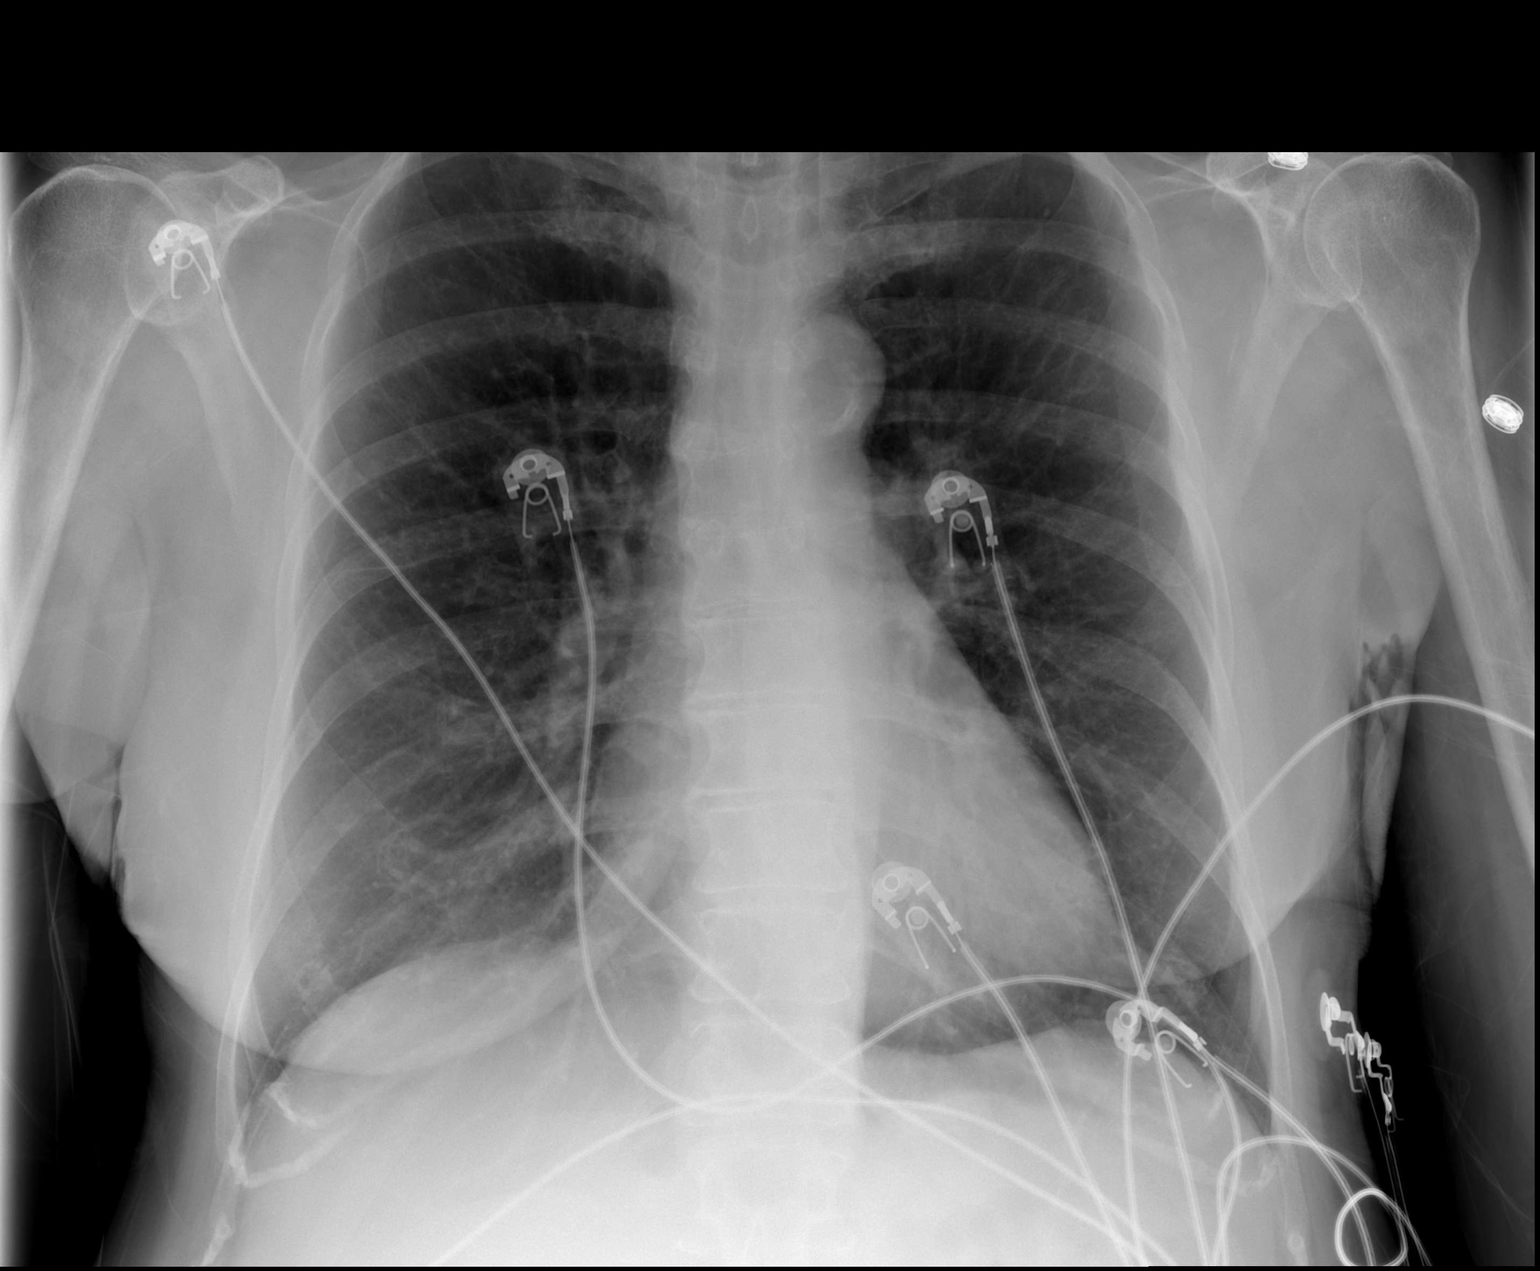

[2 of 2 positions shown; findings below may reference images not displayed]

FINDINGS: Cardiomediastinal silhouette is stable. No acute infiltrate or
pleural effusion. No pulmonary edema. Stable right base medially
atelectasis or scarring. Osteopenia and mild degenerative changes
thoracic spine.
IMPRESSION: No active disease. Stable right base medially atelectasis or
scarring. Osteopenia and mild degenerative changes thoracic spine.

## 2016-10-14 ENCOUNTER — Other Ambulatory Visit: Payer: Self-pay | Admitting: Endocrinology

## 2016-10-14 DIAGNOSIS — Z1231 Encounter for screening mammogram for malignant neoplasm of breast: Secondary | ICD-10-CM

## 2016-11-21 ENCOUNTER — Ambulatory Visit
Admission: RE | Admit: 2016-11-21 | Discharge: 2016-11-21 | Disposition: A | Discharge status: Home / Self Care | Payer: Medicare Other | Source: Ambulatory Visit | Attending: Endocrinology | Admitting: Endocrinology

## 2016-11-21 DIAGNOSIS — Z1231 Encounter for screening mammogram for malignant neoplasm of breast: Secondary | ICD-10-CM

## 2017-01-20 ENCOUNTER — Emergency Department (HOSPITAL_COMMUNITY)
Admission: EM | Admit: 2017-01-20 | Discharge: 2017-01-21 | Disposition: A | Discharge status: Home / Self Care | Payer: Medicare Other | Attending: Emergency Medicine | Admitting: Emergency Medicine

## 2017-01-20 ENCOUNTER — Encounter (HOSPITAL_COMMUNITY): Payer: Self-pay

## 2017-01-20 DIAGNOSIS — I10 Essential (primary) hypertension: Secondary | ICD-10-CM | POA: Diagnosis not present

## 2017-01-20 DIAGNOSIS — R197 Diarrhea, unspecified: Secondary | ICD-10-CM | POA: Diagnosis not present

## 2017-01-20 DIAGNOSIS — J449 Chronic obstructive pulmonary disease, unspecified: Secondary | ICD-10-CM | POA: Diagnosis not present

## 2017-01-20 DIAGNOSIS — E039 Hypothyroidism, unspecified: Secondary | ICD-10-CM | POA: Insufficient documentation

## 2017-01-20 DIAGNOSIS — F17290 Nicotine dependence, other tobacco product, uncomplicated: Secondary | ICD-10-CM | POA: Diagnosis not present

## 2017-01-20 DIAGNOSIS — R112 Nausea with vomiting, unspecified: Secondary | ICD-10-CM | POA: Diagnosis present

## 2017-01-20 LAB — CBC WITH DIFFERENTIAL/PLATELET
BASOS ABS: 0 10*3/uL (ref 0.0–0.1)
Basophils Relative: 0 %
EOS PCT: 3 %
Eosinophils Absolute: 0.2 10*3/uL (ref 0.0–0.7)
HCT: 36.9 % (ref 36.0–46.0)
HEMOGLOBIN: 13.1 g/dL (ref 12.0–15.0)
LYMPHS ABS: 2.9 10*3/uL (ref 0.7–4.0)
LYMPHS PCT: 33 %
MCH: 29.9 pg (ref 26.0–34.0)
MCHC: 35.5 g/dL (ref 30.0–36.0)
MCV: 84.2 fL (ref 78.0–100.0)
Monocytes Absolute: 1 10*3/uL (ref 0.1–1.0)
Monocytes Relative: 11 %
Neutro Abs: 4.5 10*3/uL (ref 1.7–7.7)
Neutrophils Relative %: 53 %
PLATELETS: 241 10*3/uL (ref 150–400)
RBC: 4.38 MIL/uL (ref 3.87–5.11)
RDW: 11.9 % (ref 11.5–15.5)
WBC: 8.6 10*3/uL (ref 4.0–10.5)

## 2017-01-20 MED ORDER — SODIUM CHLORIDE 0.9 % IV BOLUS (SEPSIS)
1000.0000 mL | Freq: Once | INTRAVENOUS | Status: AC
  Administered 2017-01-20: 1000 mL via INTRAVENOUS

## 2017-01-20 MED ORDER — KETOROLAC TROMETHAMINE 30 MG/ML IJ SOLN
30.0000 mg | Freq: Once | INTRAMUSCULAR | Status: AC
  Administered 2017-01-20: 30 mg via INTRAVENOUS
  Filled 2017-01-20: qty 1

## 2017-01-20 MED ORDER — ONDANSETRON HCL 4 MG/2ML IJ SOLN
4.0000 mg | Freq: Once | INTRAMUSCULAR | Status: AC
  Administered 2017-01-20: 4 mg via INTRAVENOUS
  Filled 2017-01-20: qty 2

## 2017-01-20 NOTE — ED Triage Notes (Signed)
Patient states she was on Valium for years and states she was placed on Vicodin by her physician. Patient reports that she took the Vicodin back because she wanted to be placed back on Valium,. Patient states that her physician prescribed Tramadol and Phenergan. Patient states ever since starting Phenergan and Tramadol she has been having nausea, weakness, and slight abdominal cramping.

## 2017-01-20 NOTE — ED Provider Notes (Signed)
[facility] DEPT Provider Note   CSN: [id number] Arrival date & time: 01/20/17  1626  By signing my name below, I, Audrey Meyer, attest that this documentation has been prepared under the direction and in the presence of physician practitioner, Delora Fuel, MD. Electronically Signed: Dora Meyer, Scribe. 01/20/2017. 11:03 PM.  History   Chief Complaint Chief Complaint  Patient presents with  . Nausea  . Fatigue  . Generalized Body Aches    The history is provided by the patient. No language interpreter was used.     HPI Comments: Audrey Meyer is a 61 y.o. female who presents to the Emergency Department complaining of constant, waxing and waning, generalized body aches for about 5 days. Pt reports her pain has been 10/10 at worst. She reports associated nausea, decreased appetite, diarrhea, nasal congestion, chills, and occasional episodes of diaphoresis. She states she has been drinking adequately. Pt notes her diarrhea has resolved over the last couple of days. She has seen her PCP since onset of her symptoms and was prescribed tramadol and phenergan with mild improvement of her body aches and nausea. No known sick contacts. She denies cough, rhinorrhea, difficulty urinating, fever, vomiting, or any other associated symptoms.  Past Medical History:  Diagnosis Date  . Acid reflux   . Breast cancer ([place])   . COPD (chronic obstructive pulmonary disease) ([place])   . High cholesterol   . Hypertension   . Hypothyroid   . Schizoaffective disorder     Patient Active Problem List   Diagnosis Date Noted  . Hyperthyroidism 04/30/2016  . Major depressive disorder, recurrent episode, moderate ([place]) 04/28/2016  . Chronic obstructive pulmonary disease ([place])   . Overdose 03/07/2016  . Hypokalemia 03/07/2016  . Hypothyroidism 03/07/2016  . Essential hypertension 03/07/2016  . COPD exacerbation ([place]) 03/07/2016  . Tobacco abuse 03/07/2016  . Pneumonia 03/06/2016  . CAP (community  acquired pneumonia) 03/06/2016    Past Surgical History:  Procedure Laterality Date  . APPENDECTOMY    . MASTECTOMY, PARTIAL    . TUBAL LIGATION      OB History    No data available       Home Medications    Prior to Admission medications   Medication Sig Start Date End Date Taking? Authorizing Provider  albuterol (PROVENTIL HFA;VENTOLIN HFA) 108 (90 BASE) MCG/ACT inhaler Inhale 2 puffs into the lungs every 6 (six) hours as needed. For shortness of breath   Yes Historical Provider, MD  atenolol-chlorthalidone (TENORETIC) 50-25 MG tablet Take 1 tablet by mouth 2 (two) times daily. 09/19/16  Yes Historical Provider, MD  atorvastatin (LIPITOR) 20 MG tablet Take 1 tablet (20 mg total) by mouth at bedtime. 05/05/16  Yes Benjamine Mola, FNP  diazepam (VALIUM) 5 MG tablet Take 1 tablet (5 mg total) by mouth 2 (two) times daily. Patient taking differently: Take 5 mg by mouth 2 (two) times daily as needed for anxiety.  05/05/16  Yes Benjamine Mola, FNP  gabapentin (NEURONTIN) 300 MG capsule Take 300 mg by mouth 3 (three) times daily.   Yes Historical Provider, MD  hydrALAZINE (APRESOLINE) 50 MG tablet Take 1.5 tablets (75 mg total) by mouth 3 (three) times daily. 05/05/16  Yes Benjamine Mola, FNP  hydrOXYzine (VISTARIL) 25 MG capsule Take 1 capsule (25 mg total) by mouth 3 (three) times daily as needed for anxiety. 05/05/16  Yes Benjamine Mola, FNP  levothyroxine (SYNTHROID, LEVOTHROID) 125 MCG tablet Take 125 mcg by mouth daily before breakfast.  Yes Historical Provider, MD  potassium chloride SA (K-DUR,KLOR-CON) 20 MEQ tablet Take 1 tablet (20 mEq total) by mouth 2 (two) times daily. Patient taking differently: Take 20 mEq by mouth 3 (three) times daily.  05/05/16  Yes Benjamine Mola, FNP  promethazine (PHENERGAN) 25 MG tablet Take 25 mg by mouth every 8 (eight) hours as needed for nausea or vomiting.   Yes Historical Provider, MD  sertraline (ZOLOFT) 25 MG tablet Take 75 mg by mouth daily.   Yes  Historical Provider, MD  spironolactone (ALDACTONE) 25 MG tablet Take 1 tablet (25 mg total) by mouth daily. 05/05/16  Yes Benjamine Mola, FNP  traMADol (ULTRAM) 50 MG tablet Take 50 mg by mouth at bedtime.   Yes Historical Provider, MD  trazodone (DESYREL) 300 MG tablet Take 300 mg by mouth at bedtime.   Yes Historical Provider, MD  diazepam (VALIUM) 2 MG tablet Take 1 tablet (2 mg total) by mouth every 12 (twelve) hours as needed for anxiety. Patient not taking: Reported on 01/20/2017 05/05/16   Benjamine Mola, FNP  gabapentin (NEURONTIN) 100 MG capsule Take 1 capsule (100 mg total) by mouth 3 (three) times daily. Patient not taking: Reported on 01/20/2017 05/05/16   Benjamine Mola, FNP  traZODone (DESYREL) 150 MG tablet Take 0.5 tablets (75 mg total) by mouth at bedtime as needed for sleep. Patient not taking: Reported on 01/20/2017 05/05/16   Benjamine Mola, FNP    Family History Family History  Problem Relation Age of Onset  . Heart attack Father   . Cancer Other     Social History Social History  Substance Use Topics  . Smoking status: Current Every Day Smoker    Packs/day: 0.00    Years: 2.00    Types: Cigars  . Smokeless tobacco: Never Used  . Alcohol use No     Allergies   Budesonide   Review of Systems Review of Systems  Constitutional: Positive for appetite change (decreased), chills and diaphoresis. Negative for fever.  HENT: Positive for congestion. Negative for rhinorrhea.   Respiratory: Negative for cough.   Gastrointestinal: Positive for diarrhea (resolved) and nausea. Negative for vomiting.  Genitourinary: Negative for difficulty urinating.  Musculoskeletal: Positive for myalgias.  All other systems reviewed and are negative.    Physical Exam Updated Vital Signs BP 127/60 (BP Location: Right Arm)   Pulse (!) 57   Temp 98.2 F (36.8 C) (Oral)   Resp 19   Ht 5\' 2"  (1.575 m)   Wt 133 lb (60.3 kg)   SpO2 99%   BMI 24.33 kg/m   Physical Exam    Constitutional: She is oriented to person, place, and time. She appears well-developed and well-nourished.  HENT:  Head: Normocephalic and atraumatic.  Eyes: EOM are normal. Pupils are equal, round, and reactive to light.  Neck: Normal range of motion. Neck supple. No JVD present.  Cardiovascular: Normal rate, regular rhythm and normal heart sounds.   No murmur heard. Pulmonary/Chest: Effort normal and breath sounds normal. She has no wheezes. She has no rales. She exhibits no tenderness.  Abdominal: Soft. She exhibits no distension and no mass. Bowel sounds are decreased. There is no tenderness.  Musculoskeletal: Normal range of motion. She exhibits no edema.  Lymphadenopathy:    She has no cervical adenopathy.  Neurological: She is alert and oriented to person, place, and time. No cranial nerve deficit. She exhibits normal muscle tone. Coordination normal.  Skin: Skin is warm and dry.  No rash noted.  Psychiatric: She has a normal mood and affect. Her behavior is normal. Judgment and thought content normal.  Nursing note and vitals reviewed.    ED Treatments / Results  Labs (all labs ordered are listed, but only abnormal results are displayed) Labs Reviewed  COMPREHENSIVE METABOLIC PANEL - Abnormal; Notable for the following:       Result Value   Sodium 133 (*)    Chloride 95 (*)    All other components within normal limits  URINALYSIS, ROUTINE W REFLEX MICROSCOPIC - Abnormal; Notable for the following:    Leukocytes, UA TRACE (*)    Bacteria, UA RARE (*)    Squamous Epithelial / LPF 0-5 (*)    All other components within normal limits  CBC WITH DIFFERENTIAL/PLATELET     Procedures Procedures (including critical care time)  DIAGNOSTIC STUDIES: Oxygen Saturation is 99% on RA, normal by my interpretation.    COORDINATION OF CARE: 11:09 PM Discussed treatment plan with pt at bedside and pt agreed to plan.  Medications Ordered in ED Medications  ondansetron (ZOFRAN)  injection 4 mg (not administered)  sodium chloride 0.9 % bolus 1,000 mL (not administered)  ketorolac (TORADOL) 30 MG/ML injection 30 mg (not administered)     Initial Impression / Assessment and Plan / ED Course  I have reviewed the triage vital signs and the nursing notes.  Pertinent lab results that were available during my care of the patient were reviewed by me and considered in my medical decision making (see chart for details).  Nausea, vomiting, diarrhea in pattern strongly suggestive of viral gastroenteritis. No red flags to suggest more serious illness. Laboratory workup shows mild hyponatremia which is not felt to be clinically significant. She is given IV fluids, ondansetron, ketorolac with significant improvement although she states that her legs are now feeling restless. She's given a dose of lorazepam. She is discharged with prescription for ondansetron and advised to use over-the-counter loperamide should diarrhea recur.  Final Clinical Impressions(s) / ED Diagnoses   Final diagnoses:  Nausea vomiting and diarrhea    New Prescriptions New Prescriptions   ONDANSETRON (ZOFRAN) 4 MG TABLET    Take 1 tablet (4 mg total) by mouth every 6 (six) hours as needed for nausea.   I personally performed the services described in this documentation, which was scribed in my presence. The recorded information has been reviewed and is accurate.      Delora Fuel, MD [phone]

## 2017-01-20 NOTE — ED Notes (Signed)
Pt. Stated ,' I have a more of Flu like sickness but im just so nauseous most of the time since my pain meds was changed." no s/s of respi distress.

## 2017-01-20 NOTE — ED Notes (Signed)
Called name, but no response from waiting room. 

## 2017-01-21 LAB — URINALYSIS, ROUTINE W REFLEX MICROSCOPIC
BILIRUBIN URINE: NEGATIVE
GLUCOSE, UA: NEGATIVE mg/dL
HGB URINE DIPSTICK: NEGATIVE
KETONES UR: NEGATIVE mg/dL
NITRITE: NEGATIVE
PH: 6 (ref 5.0–8.0)
Protein, ur: NEGATIVE mg/dL
SPECIFIC GRAVITY, URINE: 1.013 (ref 1.005–1.030)

## 2017-01-21 LAB — COMPREHENSIVE METABOLIC PANEL
ALT: 18 U/L (ref 14–54)
AST: 24 U/L (ref 15–41)
Albumin: 4.2 g/dL (ref 3.5–5.0)
Alkaline Phosphatase: 59 U/L (ref 38–126)
Anion gap: 8 (ref 5–15)
BUN: 12 mg/dL (ref 6–20)
CHLORIDE: 95 mmol/L — AB (ref 101–111)
CO2: 30 mmol/L (ref 22–32)
Calcium: 9.9 mg/dL (ref 8.9–10.3)
Creatinine, Ser: 0.92 mg/dL (ref 0.44–1.00)
Glucose, Bld: 96 mg/dL (ref 65–99)
Potassium: 3.8 mmol/L (ref 3.5–5.1)
Sodium: 133 mmol/L — ABNORMAL LOW (ref 135–145)
Total Bilirubin: 1 mg/dL (ref 0.3–1.2)
Total Protein: 7.3 g/dL (ref 6.5–8.1)

## 2017-01-21 MED ORDER — ONDANSETRON HCL 4 MG PO TABS: 4.0000 mg | ORAL_TABLET | Freq: Four times a day (QID) | ORAL | 0 refills | Status: DC | PRN

## 2017-01-21 MED ORDER — LORAZEPAM 1 MG PO TABS
1.0000 mg | ORAL_TABLET | Freq: Once | ORAL | Status: AC
  Administered 2017-01-21: 1 mg via ORAL
  Filled 2017-01-21: qty 1

## 2017-01-21 NOTE — Discharge Instructions (Signed)
Take loperamide (Imodium A-D) as needed for diarrhea. ?

## 2017-01-24 ENCOUNTER — Emergency Department (HOSPITAL_COMMUNITY)
Admission: EM | Admit: 2017-01-24 | Discharge: 2017-01-24 | Disposition: A | Discharge status: Home / Self Care | Payer: Medicare Other | Attending: Emergency Medicine | Admitting: Emergency Medicine

## 2017-01-24 ENCOUNTER — Encounter (HOSPITAL_COMMUNITY): Payer: Self-pay

## 2017-01-24 DIAGNOSIS — F19930 Other psychoactive substance use, unspecified with withdrawal, uncomplicated: Secondary | ICD-10-CM | POA: Insufficient documentation

## 2017-01-24 DIAGNOSIS — F19939 Other psychoactive substance use, unspecified with withdrawal, unspecified: Secondary | ICD-10-CM

## 2017-01-24 DIAGNOSIS — J449 Chronic obstructive pulmonary disease, unspecified: Secondary | ICD-10-CM | POA: Insufficient documentation

## 2017-01-24 DIAGNOSIS — E039 Hypothyroidism, unspecified: Secondary | ICD-10-CM | POA: Insufficient documentation

## 2017-01-24 DIAGNOSIS — R6889 Other general symptoms and signs: Secondary | ICD-10-CM

## 2017-01-24 DIAGNOSIS — R110 Nausea: Secondary | ICD-10-CM

## 2017-01-24 DIAGNOSIS — E860 Dehydration: Secondary | ICD-10-CM | POA: Insufficient documentation

## 2017-01-24 DIAGNOSIS — I10 Essential (primary) hypertension: Secondary | ICD-10-CM | POA: Insufficient documentation

## 2017-01-24 DIAGNOSIS — F17290 Nicotine dependence, other tobacco product, uncomplicated: Secondary | ICD-10-CM | POA: Diagnosis not present

## 2017-01-24 DIAGNOSIS — A084 Viral intestinal infection, unspecified: Secondary | ICD-10-CM | POA: Diagnosis not present

## 2017-01-24 DIAGNOSIS — Z720 Tobacco use: Secondary | ICD-10-CM

## 2017-01-24 LAB — COMPREHENSIVE METABOLIC PANEL
ALT: 17 U/L (ref 14–54)
AST: 20 U/L (ref 15–41)
Albumin: 4.3 g/dL (ref 3.5–5.0)
Alkaline Phosphatase: 58 U/L (ref 38–126)
Anion gap: 7 (ref 5–15)
BUN: 11 mg/dL (ref 6–20)
CO2: 32 mmol/L (ref 22–32)
CREATININE: 1.02 mg/dL — AB (ref 0.44–1.00)
Calcium: 9.5 mg/dL (ref 8.9–10.3)
Chloride: 92 mmol/L — ABNORMAL LOW (ref 101–111)
GFR calc Af Amer: >60 mL/min (ref >60)
GFR, EST NON AFRICAN AMERICAN: 58 mL/min — AB (ref >60)
Glucose, Bld: 107 mg/dL — ABNORMAL HIGH (ref 65–99)
POTASSIUM: 4.1 mmol/L (ref 3.5–5.1)
Sodium: 131 mmol/L — ABNORMAL LOW (ref 135–145)
Total Bilirubin: 0.7 mg/dL (ref 0.3–1.2)
Total Protein: 7.3 g/dL (ref 6.5–8.1)

## 2017-01-24 LAB — URINALYSIS, ROUTINE W REFLEX MICROSCOPIC
BILIRUBIN URINE: NEGATIVE
Bacteria, UA: NONE SEEN
Glucose, UA: NEGATIVE mg/dL
HGB URINE DIPSTICK: NEGATIVE
Ketones, ur: NEGATIVE mg/dL
NITRITE: NEGATIVE
PH: 8 (ref 5.0–8.0)
Protein, ur: NEGATIVE mg/dL
SPECIFIC GRAVITY, URINE: 1.015 (ref 1.005–1.030)

## 2017-01-24 LAB — CBC
HCT: 38.3 % (ref 36.0–46.0)
Hemoglobin: 13.1 g/dL (ref 12.0–15.0)
MCH: 29.6 pg (ref 26.0–34.0)
MCHC: 34.2 g/dL (ref 30.0–36.0)
MCV: 86.5 fL (ref 78.0–100.0)
PLATELETS: 231 10*3/uL (ref 150–400)
RBC: 4.43 MIL/uL (ref 3.87–5.11)
RDW: 12.2 % (ref 11.5–15.5)
WBC: 9.5 10*3/uL (ref 4.0–10.5)

## 2017-01-24 LAB — LIPASE, BLOOD: Lipase: 19 U/L (ref 11–51)

## 2017-01-24 MED ORDER — SODIUM CHLORIDE 0.9 % IV BOLUS (SEPSIS)
1000.0000 mL | Freq: Once | INTRAVENOUS | Status: AC
  Administered 2017-01-24: 1000 mL via INTRAVENOUS

## 2017-01-24 MED ORDER — KETOROLAC TROMETHAMINE 30 MG/ML IJ SOLN
15.0000 mg | Freq: Once | INTRAMUSCULAR | Status: AC
  Administered 2017-01-24: 15 mg via INTRAVENOUS
  Filled 2017-01-24: qty 1

## 2017-01-24 NOTE — ED Triage Notes (Signed)
Per EMS, Pt, from home, c/o generalized body aches and n/v x 1 week.  Pain score 8/10.  Pt was seen x 4 ago for same.  Per previous triage note, Pt has had a recent medication change and symptoms began after the change.  Sts she has been taking prescribed medications w/ relief.

## 2017-01-24 NOTE — ED Provider Notes (Signed)
[place] DEPT Provider Note   CSN: [id number] Arrival date & time: 01/24/17  1234     History   Chief Complaint Chief Complaint  Patient presents with  . Emesis  . Generalized Body Aches    HPI Audrey Meyer is a 61 y.o. female with a PMHx of GERD, remote breast CA, COPD, HTN, and schizoaffective disorder, with a PSHx of appendectomy and mastectomy, who presents to the ED with complaints of ongoing generalized body aches, decreased appetite, chills, nausea, and sinus congestion x >1 week. Chart review reveals that she was seen in the ED on 01/20/17 for the same complaints, which she stated had started after she had been changed from valium to vicodin by her PCP; on that ED visit she had an unremarkable work up, was given zofran, IVFs, and toradol and symptoms improved, so she was discharged with zofran rx. Patient clarifies today that she was taken off of her Valium in order to be started on Vicodin, but she was unable to tolerate Vicodin and therefore she was switched back to the Valium and taken off of Vicodin, and it was after that that the symptoms started. Around that same time she was given tramadol and Phenergan as well. She denies that her symptoms have significantly improved since being restarted on her Valium and stopped on the Vicodin, although patient is somewhat of a poor historian and is having difficulty with the timeline of events. She states that she has been using Zofran which has been helping with her nausea, and she is taking tramadol for her pain which is helping. She initially had diarrhea several days before her last ED visit, but it had self resolved prior to her last visit, and she has taken Imodium with significant relief and has not had any ongoing diarrhea since several days ago. No known aggravating factors. +Smoker. She denies any recent travel, sick contacts, suspicious food intake, alcohol use, or chronic NSAID use.  She also denies fevers, ear pain/drainage,  sore throat, cough, CP, SOB, abd pain, vomiting, ongoing diarrhea, constipation, obstipation, melena, hematochezia, hematuria, dysuria, arthralgias, numbness, tingling, focal weakness, or any other complaints at this time.    The history is provided by the patient and medical records. No language interpreter was used.  URI   This is a new problem. The current episode started more than 1 week ago. The problem has been gradually improving. There has been no fever. Associated symptoms include nausea, congestion and rhinorrhea. Pertinent negatives include no chest pain, no abdominal pain, no diarrhea (resolved), no vomiting, no dysuria, no ear pain, no sore throat and no cough. She has tried other medications (tramadol, zofran, and immodium) for the symptoms. The treatment provided significant relief.    Past Medical History:  Diagnosis Date  . Acid reflux   . Breast cancer ([place])   . COPD (chronic obstructive pulmonary disease) ([place])   . High cholesterol   . Hypertension   . Hypothyroid   . Schizoaffective disorder     Patient Active Problem List   Diagnosis Date Noted  . Hyperthyroidism 04/30/2016  . Major depressive disorder, recurrent episode, moderate ([place]) 04/28/2016  . Chronic obstructive pulmonary disease ([place])   . Overdose 03/07/2016  . Hypokalemia 03/07/2016  . Hypothyroidism 03/07/2016  . Essential hypertension 03/07/2016  . COPD exacerbation ([place]) 03/07/2016  . Tobacco abuse 03/07/2016  . Pneumonia 03/06/2016  . CAP (community acquired pneumonia) 03/06/2016    Past Surgical History:  Procedure Laterality Date  . APPENDECTOMY    .  MASTECTOMY, PARTIAL    . TUBAL LIGATION      OB History    No data available       Home Medications    Prior to Admission medications   Medication Sig Start Date End Date Taking? Authorizing Provider  albuterol (PROVENTIL HFA;VENTOLIN HFA) 108 (90 BASE) MCG/ACT inhaler Inhale 2 puffs into the lungs every 6 (six) hours as needed. For  shortness of breath    Historical Provider, MD  atenolol-chlorthalidone (TENORETIC) 50-25 MG tablet Take 1 tablet by mouth 2 (two) times daily. 09/19/16   Historical Provider, MD  atorvastatin (LIPITOR) 20 MG tablet Take 1 tablet (20 mg total) by mouth at bedtime. 05/05/16   Benjamine Mola, FNP  gabapentin (NEURONTIN) 100 MG capsule Take 1 capsule (100 mg total) by mouth 3 (three) times daily. Patient not taking: Reported on 01/20/2017 05/05/16   Benjamine Mola, FNP  gabapentin (NEURONTIN) 300 MG capsule Take 300 mg by mouth 3 (three) times daily.    Historical Provider, MD  hydrALAZINE (APRESOLINE) 50 MG tablet Take 1.5 tablets (75 mg total) by mouth 3 (three) times daily. 05/05/16   Benjamine Mola, FNP  hydrOXYzine (VISTARIL) 25 MG capsule Take 1 capsule (25 mg total) by mouth 3 (three) times daily as needed for anxiety. 05/05/16   Benjamine Mola, FNP  levothyroxine (SYNTHROID, LEVOTHROID) 125 MCG tablet Take 125 mcg by mouth daily before breakfast.    Historical Provider, MD  ondansetron (ZOFRAN) 4 MG tablet Take 1 tablet (4 mg total) by mouth every 6 (six) hours as needed for nausea. [id number]   Delora Fuel, MD  potassium chloride SA (K-DUR,KLOR-CON) 20 MEQ tablet Take 1 tablet (20 mEq total) by mouth 2 (two) times daily. Patient taking differently: Take 20 mEq by mouth 3 (three) times daily.  05/05/16   Benjamine Mola, FNP  promethazine (PHENERGAN) 25 MG tablet Take 25 mg by mouth every 8 (eight) hours as needed for nausea or vomiting.    Historical Provider, MD  sertraline (ZOLOFT) 25 MG tablet Take 75 mg by mouth daily.    Historical Provider, MD  spironolactone (ALDACTONE) 25 MG tablet Take 1 tablet (25 mg total) by mouth daily. 05/05/16   Benjamine Mola, FNP  traMADol (ULTRAM) 50 MG tablet Take 50 mg by mouth at bedtime.    Historical Provider, MD  traZODone (DESYREL) 150 MG tablet Take 0.5 tablets (75 mg total) by mouth at bedtime as needed for sleep. Patient not taking: Reported on 01/20/2017 05/05/16    Benjamine Mola, FNP  trazodone (DESYREL) 300 MG tablet Take 300 mg by mouth at bedtime.    Historical Provider, MD    Family History Family History  Problem Relation Age of Onset  . Heart attack Father   . Cancer Other     Social History Social History  Substance Use Topics  . Smoking status: Current Every Day Smoker    Packs/day: 0.00    Years: 2.00    Types: Cigars  . Smokeless tobacco: Never Used  . Alcohol use No     Allergies   Budesonide   Review of Systems Review of Systems  Constitutional: Positive for appetite change and chills. Negative for fever.  HENT: Positive for congestion and rhinorrhea. Negative for ear discharge, ear pain and sore throat.   Respiratory: Negative for cough and shortness of breath.   Cardiovascular: Negative for chest pain.  Gastrointestinal: Positive for nausea. Negative for abdominal pain, blood in stool, constipation,  diarrhea (resolved) and vomiting.  Genitourinary: Negative for dysuria and hematuria.  Musculoskeletal: Positive for myalgias (body aches). Negative for arthralgias.  Skin: Negative for color change.  Allergic/Immunologic: Negative for immunocompromised state.  Neurological: Negative for weakness and numbness.  Psychiatric/Behavioral: Negative for confusion.   10 Systems reviewed and are negative for acute change except as noted in the HPI.   Physical Exam Updated Vital Signs BP 117/65 (BP Location: Right Arm)   Pulse (!) 50   Temp 98.9 F (37.2 C) (Oral)   Resp 12   Ht 5\' 2"  (1.575 m)   Wt 60.3 kg   SpO2 95%   BMI 24.33 kg/m   Physical Exam  Constitutional: She is oriented to person, place, and time. Vital signs are normal. She appears well-developed and well-nourished.  Non-toxic appearance. No distress.  Afebrile, nontoxic, NAD  HENT:  Head: Normocephalic and atraumatic.  Nose: Mucosal edema and rhinorrhea present.  Mouth/Throat: Uvula is midline, oropharynx is clear and moist and mucous membranes are  normal. No trismus in the jaw. No uvula swelling. Tonsils are 0 on the right. Tonsils are 0 on the left. No tonsillar exudate.  Nose with mild mucosal edema and clear rhinorrhea. Oropharynx clear and moist, without uvular swelling or deviation, no trismus or drooling, no tonsillar swelling or erythema, no exudates.    Eyes: Conjunctivae and EOM are normal. Right eye exhibits no discharge. Left eye exhibits no discharge.  Neck: Normal range of motion. Neck supple.  Cardiovascular: Normal rate, regular rhythm, normal heart sounds and intact distal pulses.  Exam reveals no gallop and no friction rub.   No murmur heard. Pulmonary/Chest: Effort normal and breath sounds normal. No respiratory distress. She has no decreased breath sounds. She has no wheezes. She has no rhonchi. She has no rales.  Abdominal: Soft. Normal appearance and bowel sounds are normal. She exhibits no distension. There is no tenderness. There is no rigidity, no rebound, no guarding, no CVA tenderness, no tenderness at McBurney's point and negative Murphy's sign.  Soft, NTND, +BS throughout, no r/g/r, neg murphy's, neg mcburney's, no CVA TTP   Musculoskeletal: Normal range of motion.  MAE x4 Strength and sensation grossly intact in all extremities Distal pulses intact Gait steady  Neurological: She is alert and oriented to person, place, and time. She has normal strength. No sensory deficit.  Skin: Skin is warm, dry and intact. No rash noted.  Psychiatric: She has a normal mood and affect.  Nursing note and vitals reviewed.    ED Treatments / Results  Labs (all labs ordered are listed, but only abnormal results are displayed) Labs Reviewed  COMPREHENSIVE METABOLIC PANEL - Abnormal; Notable for the following:       Result Value   Sodium 131 (*)    Chloride 92 (*)    Glucose, Bld 107 (*)    Creatinine, Ser 1.02 (*)    GFR calc non Af Amer 58 (*)    All other components within normal limits  URINALYSIS, ROUTINE W REFLEX  MICROSCOPIC - Abnormal; Notable for the following:    APPearance HAZY (*)    Leukocytes, UA LARGE (*)    Squamous Epithelial / LPF 6-30 (*)    All other components within normal limits  LIPASE, BLOOD  CBC    EKG  EKG Interpretation None       Radiology No results found.  Procedures Procedures (including critical care time)  Medications Ordered in ED Medications  sodium chloride 0.9 % bolus 1,000  mL (1,000 mLs Intravenous New Bag/Given 01/24/17 1727)  ketorolac (TORADOL) 30 MG/ML injection 15 mg (15 mg Intravenous Given 01/24/17 1727)     Initial Impression / Assessment and Plan / ED Course  I have reviewed the triage vital signs and the nursing notes.  Pertinent labs & imaging results that were available during my care of the patient were reviewed by me and considered in my medical decision making (see chart for details).     61 y.o. female here with ongoing flu-like/viral illness symptoms including body aches, chills, decreased appetite, sinus congestion, and nausea. Was seen 01/20/17 in the ER for same complaints, but also reported diarrhea that had subsided prior to evaluation that day. Had unremarkable work up and was given toradol, fluids, and zofran and felt better so she was discharged home with zofran. States that has been helping, took pill 2hrs prior to exam and feels better. On exam, mild nasal congestion, no abdominal tenderness, throat clear, and otherwise unremarkable exam. Labs reveal: U/A contaminated with 6-30 squamous, no bacteria seen, doubt UTI. Lipase WNL. CMP with Na 131 similar to prior, Cr slightly bumped at 1.02. CBC WNL. Overall it seems like she has had a viral illness, vs potentially  withdrawal-type symptoms from changing her meds just prior to onset of symptoms (although pt states she has changed back since and doesn't feel better). Doubt need for further emergent work up, will give fluids and lower dose toradol, then reassess shortly  7:01 PM Pt  feeling slightly better, tolerating PO well here. Overall seems likely to be viral illness vs withdrawal syndrome from medication changes. Advised continued use of meds already given at prior ED visit, and OTC symptomatic control meds. Stay hydrated.  Smoking cessation advised. BRAT diet advised for any further diarrhea. F/up with PCP in 1wk for recheck. I explained the diagnosis and have given explicit precautions to return to the ER including for any other new or worsening symptoms. The patient understands and accepts the medical plan as it's been dictated and I have answered their questions. Discharge instructions concerning home care and prescriptions have been given. The patient is STABLE and is discharged to home in good condition.   Final Clinical Impressions(s) / ED Diagnoses   Final diagnoses:  Flu-like symptoms  Viral gastroenteritis  Nausea  Dehydration  Withdrawal from other psychoactive substance ([place])  Tobacco user    New Prescriptions New Prescriptions   No medications on file     [address] PA-C 01/24/17 1901    Tanna Furry, MD 01/25/17 [phone]

## 2017-01-24 NOTE — Discharge Instructions (Signed)
Your symptoms could be due to a viral illness, or could be due to your recent medication changes. Continue to use zofran as prescribed, as needed for nausea. Alternate between tylenol and motrin as needed for pain, and continue to take your usual home medications as well. STOP SMOKING! Stay well hydrated with small sips of fluids throughout the day. Follow a BRAT (banana-rice-applesauce-toast) diet as described below for the next 24-48 hours. The 'BRAT' diet is suggested, then progress to diet as tolerated as symptoms abate. Call your regular doctor if bloody stools, persistent diarrhea, vomiting, fever or abdominal pain. Follow up with your regular doctor in 1 week for recheck of symptoms. Return to ER for changing or worsening of symptoms.

## 2017-01-26 ENCOUNTER — Ambulatory Visit (HOSPITAL_COMMUNITY)
Admission: AD | Admit: 2017-01-26 | Discharge: 2017-01-26 | Disposition: A | Discharge status: Home / Self Care | Payer: Medicare Other | Source: Intra-hospital | Attending: Psychiatry | Admitting: Psychiatry

## 2017-01-26 DIAGNOSIS — F411 Generalized anxiety disorder: Secondary | ICD-10-CM | POA: Diagnosis not present

## 2017-01-26 DIAGNOSIS — Z79899 Other long term (current) drug therapy: Secondary | ICD-10-CM | POA: Diagnosis not present

## 2017-01-26 DIAGNOSIS — F17290 Nicotine dependence, other tobacco product, uncomplicated: Secondary | ICD-10-CM | POA: Diagnosis not present

## 2017-01-26 DIAGNOSIS — F331 Major depressive disorder, recurrent, moderate: Secondary | ICD-10-CM | POA: Diagnosis present

## 2017-01-26 NOTE — H&P (Signed)
Behavioral Health Medical Screening Exam  Audrey Meyer is an 61 y.o. female.  Total Time spent with patient: 15 minutes  Psychiatric Specialty Exam: Physical Exam  Constitutional: She is oriented to person, place, and time. She appears well-developed and well-nourished.  HENT:  Head: Normocephalic and atraumatic.  Right Ear: External ear normal.  Left Ear: External ear normal.  Neck: Normal range of motion.  Cardiovascular: Normal rate and normal heart sounds.   Respiratory: Effort normal and breath sounds normal.  GI: Soft. Bowel sounds are normal.  Musculoskeletal: Normal range of motion.  Neurological: She is alert and oriented to person, place, and time.  Skin: Skin is warm and dry.    Review of Systems  Psychiatric/Behavioral: Positive for depression. Negative for hallucinations, memory loss, substance abuse and suicidal ideas. The patient is nervous/anxious. The patient does not have insomnia.   All other systems reviewed and are negative.   Blood pressure (!) 116/57, pulse (!) 55, temperature 98.7 F (37.1 C), temperature source Oral, resp. rate 16, SpO2 97 %.There is no height or weight on file to calculate BMI.  General Appearance: Casual and Fairly Groomed  Eye Contact:  Good  Speech:  Clear and Coherent and Normal Rate  Volume:  Normal  Mood:  Anxious and Depressed  Affect:  Congruent, Depressed and Flat  Thought Process:  Coherent, Goal Directed and Linear  Orientation:  Full (Time, Place, and Person)  Thought Content:  Logical and Rumination  Suicidal Thoughts:  No  Homicidal Thoughts:  No  Memory:  Immediate;   Good Recent;   Good Remote;   Fair  Judgement:  Fair  Insight:  Fair  Psychomotor Activity:  Normal  Concentration: Concentration: Good and Attention Span: Good  Recall:  Good  Fund of Knowledge:Good  Language: Good  Akathisia:  No  Handed:  Right  AIMS (if indicated):     Assets:  Communication Skills Desire for Improvement Financial  Resources/Insurance Housing Physical Health Resilience Social Support  Sleep:       Musculoskeletal: Strength & Muscle Tone: within normal limits Gait & Station: normal Patient leans: N/A  Blood pressure (!) 116/57, pulse (!) 55, temperature 98.7 F (37.1 C), temperature source Oral, resp. rate 16, SpO2 97 %.  Recommendations:  Based on my evaluation the patient does not appear to have an emergency medical condition.  Ethelene Hal, NP 01/26/2017, 12:26 PM

## 2017-01-26 NOTE — BH Assessment (Signed)
Tele Assessment Note   Audrey Meyer is an 61 y.o. female. Pt denies SI/HI and AVH. Pt reports increased anxiety and depression due to medication issues. Pt states she does not feel that her medication is effective. Pt is prescribed Gabapentin, Sertraline, Hydroxyzine, and Diazepam. Pt is seen at [facility] by Dr. Josph Macho. Pt denies therapy. Pt reports previous hospitalizations. Per Pt her last hospitalization was May 2017 at [facility]. Pt does not report any additional stressors. Pt states that she feels confused and depressed daily. Pt denies contacting [facility] in reference to her medication difficulties. Pt states "I didn't know I could go there for my medication issues." Pt states her medication was last changed at her last hospitalization at [facility]. Pt reports family support from her ex-husband. Pt denies SA and abuse.   Writer consulted with Margarita Grizzle, NP. Per Margarita Grizzle Pt does not meet inpatient criteria. Recommends follow-up with current provider.   Diagnosis:  F41,1 GAD, F33.1 MDD  Past Medical History:  Past Medical History:  Diagnosis Date  . Acid reflux   . Breast cancer ([place])   . COPD (chronic obstructive pulmonary disease) ([place])   . High cholesterol   . Hypertension   . Hypothyroid   . Schizoaffective disorder     Past Surgical History:  Procedure Laterality Date  . APPENDECTOMY    . MASTECTOMY, PARTIAL    . TUBAL LIGATION      Family History:  Family History  Problem Relation Age of Onset  . Heart attack Father   . Cancer Other     Social History:  reports that she has been smoking Cigars.  She has been smoking about 0.00 packs per day for the past 2.00 years. She has never used smokeless tobacco. She reports that she does not drink alcohol or use drugs.  Additional Social History:     CIWA: CIWA-Ar BP: (!) 116/57 Pulse Rate: (!) 55 COWS:    PATIENT STRENGTHS: (choose at least two) Average or above average intelligence Communication skills  Allergies:  Allergies   Allergen Reactions  . Budesonide Shortness Of Breath    Home Medications:  (Not in a hospital admission)  OB/GYN Status:  No LMP recorded. Patient is postmenopausal.  General Assessment Data Location of Assessment: [facility] Assessment Services TTS Assessment: In system Is this a Tele or Face-to-Face Assessment?: Face-to-Face Is this an Initial Assessment or a Re-assessment for this encounter?: Initial Assessment Marital status: Single Maiden name: NA Is patient pregnant?: No Pregnancy Status: No Living Arrangements: Alone Can pt return to current living arrangement?: Yes Admission Status: Voluntary Is patient capable of signing voluntary admission?: Yes Referral Source: Self/Family/Friend Insurance type: Medicare  Medical Screening Exam ([place]) Medical Exam completed: Yes  Crisis Care Plan Living Arrangements: Alone Legal Guardian: Other: (self) Name of Psychiatrist: [place] Name of Therapist: [facility]  Education Status Is patient currently in school?: No Current Grade: NA Highest grade of school patient has completed: 12 Name of school: NA Contact person: NA  Risk to self with the past 6 months Suicidal Ideation: No Has patient been a risk to self within the past 6 months prior to admission? : No Suicidal Intent: No Has patient had any suicidal intent within the past 6 months prior to admission? : No Is patient at risk for suicide?: No Suicidal Plan?: No Has patient had any suicidal plan within the past 6 months prior to admission? : No Access to Means: No What has been your use of drugs/alcohol within the last  12 months?: NA Previous Attempts/Gestures: No How many times?: 0 Other Self Harm Risks: NA Triggers for Past Attempts: None known Intentional Self Injurious Behavior: None Family Suicide History: No Recent stressful life event(s): Other (Comment) (anxiety) Persecutory voices/beliefs?: No Depression: Yes Depression Symptoms: Isolating, Loss of  interest in usual pleasures, Feeling angry/irritable, Guilt Substance abuse history and/or treatment for substance abuse?: No Suicide prevention information given to non-admitted patients: Not applicable  Risk to Others within the past 6 months Homicidal Ideation: No Does patient have any lifetime risk of violence toward others beyond the six months prior to admission? : No Thoughts of Harm to Others: No Current Homicidal Intent: No Current Homicidal Plan: No Access to Homicidal Means: No Identified Victim: NA History of harm to others?: No Assessment of Violence: None Noted Violent Behavior Description: NA Does patient have access to weapons?: No Criminal Charges Pending?: No Does patient have a court date: No Is patient on probation?: No  Psychosis Hallucinations: None noted Delusions: None noted  Mental Status Report Appearance/Hygiene: Disheveled Eye Contact: Fair Motor Activity: Freedom of movement Speech: Logical/coherent Level of Consciousness: Alert Mood: Depressed Affect: Depressed Anxiety Level: Minimal Thought Processes: Coherent, Relevant Judgement: Unimpaired Orientation: Person, Place, Time, Situation, Appropriate for developmental age Obsessive Compulsive Thoughts/Behaviors: None  Cognitive Functioning Concentration: Normal Memory: Recent Intact, Remote Intact IQ: Average Insight: Fair Impulse Control: Fair Appetite: Fair Weight Loss: 0 Weight Gain: 0 Sleep: Decreased Total Hours of Sleep: 5 Vegetative Symptoms: None  ADLScreening [facility] Assessment Services) Patient's cognitive ability adequate to safely complete daily activities?: Yes Patient able to express need for assistance with ADLs?: Yes Independently performs ADLs?: Yes (appropriate for developmental age)  Prior Inpatient Therapy Prior Inpatient Therapy: Yes Prior Therapy Dates: 2017 Prior Therapy Facilty/Provider(s): NA Reason for Treatment: anxiety  Prior Outpatient Therapy Prior  Outpatient Therapy: Yes Prior Therapy Dates: current  Prior Therapy Facilty/Provider(s): [facility] Reason for Treatment: anxiety Does patient have an ACCT team?: No Does patient have Intensive In-House Services?  : No Does patient have [facility] services? : No Does patient have P4CC services?: No  ADL Screening (condition at time of admission) Patient's cognitive ability adequate to safely complete daily activities?: Yes Patient able to express need for assistance with ADLs?: Yes Independently performs ADLs?: Yes (appropriate for developmental age)                  Additional Information 1:1 In Past 12 Months?: No CIRT Risk: No Elopement Risk: No Does patient have medical clearance?: Yes     Disposition:  Disposition Initial Assessment Completed for this Encounter: Yes Disposition of Patient: Outpatient treatment Type of outpatient treatment: Adult  Zuri Lascala D 01/26/2017 12:36 PM

## 2017-01-31 ENCOUNTER — Observation Stay (HOSPITAL_COMMUNITY)
Admission: EM | Admit: 2017-01-31 | Discharge: 2017-02-01 | Disposition: A | Discharge status: Home / Self Care | Payer: Medicare Other | Attending: Internal Medicine | Admitting: Internal Medicine

## 2017-01-31 ENCOUNTER — Encounter (HOSPITAL_COMMUNITY): Payer: Self-pay | Admitting: *Deleted

## 2017-01-31 ENCOUNTER — Emergency Department (HOSPITAL_BASED_OUTPATIENT_CLINIC_OR_DEPARTMENT_OTHER)
Admit: 2017-01-31 | Discharge: 2017-01-31 | Disposition: A | Discharge status: Home / Self Care | Payer: Medicare Other | Attending: Emergency Medicine | Admitting: Emergency Medicine

## 2017-01-31 DIAGNOSIS — Z853 Personal history of malignant neoplasm of breast: Secondary | ICD-10-CM | POA: Insufficient documentation

## 2017-01-31 DIAGNOSIS — Z79899 Other long term (current) drug therapy: Secondary | ICD-10-CM | POA: Diagnosis not present

## 2017-01-31 DIAGNOSIS — E039 Hypothyroidism, unspecified: Secondary | ICD-10-CM | POA: Diagnosis not present

## 2017-01-31 DIAGNOSIS — I10 Essential (primary) hypertension: Secondary | ICD-10-CM | POA: Diagnosis present

## 2017-01-31 DIAGNOSIS — M79609 Pain in unspecified limb: Secondary | ICD-10-CM

## 2017-01-31 DIAGNOSIS — J449 Chronic obstructive pulmonary disease, unspecified: Secondary | ICD-10-CM | POA: Diagnosis not present

## 2017-01-31 DIAGNOSIS — M79605 Pain in left leg: Secondary | ICD-10-CM | POA: Diagnosis not present

## 2017-01-31 DIAGNOSIS — Z720 Tobacco use: Secondary | ICD-10-CM | POA: Diagnosis present

## 2017-01-31 DIAGNOSIS — E876 Hypokalemia: Secondary | ICD-10-CM | POA: Diagnosis present

## 2017-01-31 DIAGNOSIS — E871 Hypo-osmolality and hyponatremia: Principal | ICD-10-CM | POA: Insufficient documentation

## 2017-01-31 DIAGNOSIS — F259 Schizoaffective disorder, unspecified: Secondary | ICD-10-CM

## 2017-01-31 DIAGNOSIS — F251 Schizoaffective disorder, depressive type: Secondary | ICD-10-CM | POA: Diagnosis present

## 2017-01-31 DIAGNOSIS — R110 Nausea: Secondary | ICD-10-CM

## 2017-01-31 DIAGNOSIS — F17290 Nicotine dependence, other tobacco product, uncomplicated: Secondary | ICD-10-CM | POA: Insufficient documentation

## 2017-01-31 HISTORY — DX: Hypo-osmolality and hyponatremia: E87.1

## 2017-01-31 LAB — BASIC METABOLIC PANEL
ANION GAP: 4 — AB (ref 5–15)
Anion gap: 10 (ref 5–15)
BUN: 8 mg/dL (ref 6–20)
BUN: 9 mg/dL (ref 6–20)
CALCIUM: 8.2 mg/dL — AB (ref 8.9–10.3)
CHLORIDE: 86 mmol/L — AB (ref 101–111)
CO2: 28 mmol/L (ref 22–32)
CO2: 29 mmol/L (ref 22–32)
CREATININE: 0.74 mg/dL (ref 0.44–1.00)
CREATININE: 0.9 mg/dL (ref 0.44–1.00)
Calcium: 8.9 mg/dL (ref 8.9–10.3)
Chloride: 95 mmol/L — ABNORMAL LOW (ref 101–111)
GFR calc non Af Amer: >60 mL/min (ref >60)
GLUCOSE: 90 mg/dL (ref 65–99)
Glucose, Bld: 103 mg/dL — ABNORMAL HIGH (ref 65–99)
Potassium: 2.8 mmol/L — ABNORMAL LOW (ref 3.5–5.1)
Potassium: 3.3 mmol/L — ABNORMAL LOW (ref 3.5–5.1)
SODIUM: 128 mmol/L — AB (ref 135–145)
Sodium: 124 mmol/L — ABNORMAL LOW (ref 135–145)

## 2017-01-31 LAB — HEPATIC FUNCTION PANEL
ALT: 13 U/L — ABNORMAL LOW (ref 14–54)
AST: 18 U/L (ref 15–41)
Albumin: 4.3 g/dL (ref 3.5–5.0)
Alkaline Phosphatase: 60 U/L (ref 38–126)
BILIRUBIN DIRECT: 0.1 mg/dL (ref 0.1–0.5)
BILIRUBIN INDIRECT: 0.6 mg/dL (ref 0.3–0.9)
BILIRUBIN TOTAL: 0.7 mg/dL (ref 0.3–1.2)
Total Protein: 6.8 g/dL (ref 6.5–8.1)

## 2017-01-31 LAB — CBC WITH DIFFERENTIAL/PLATELET
BASOS PCT: 0 %
Basophils Absolute: 0 10*3/uL (ref 0.0–0.1)
EOS ABS: 0.1 10*3/uL (ref 0.0–0.7)
EOS PCT: 1 %
HCT: 35.6 % — ABNORMAL LOW (ref 36.0–46.0)
HEMOGLOBIN: 12.8 g/dL (ref 12.0–15.0)
Lymphocytes Relative: 14 %
Lymphs Abs: 1 10*3/uL (ref 0.7–4.0)
MCH: 29.5 pg (ref 26.0–34.0)
MCHC: 36 g/dL (ref 30.0–36.0)
MCV: 82 fL (ref 78.0–100.0)
MONOS PCT: 11 %
Monocytes Absolute: 0.8 10*3/uL (ref 0.1–1.0)
NEUTROS PCT: 74 %
Neutro Abs: 5.4 10*3/uL (ref 1.7–7.7)
PLATELETS: 235 10*3/uL (ref 150–400)
RBC: 4.34 MIL/uL (ref 3.87–5.11)
RDW: 12 % (ref 11.5–15.5)
WBC: 7.2 10*3/uL (ref 4.0–10.5)

## 2017-01-31 LAB — TSH: TSH: 2.549 u[IU]/mL (ref 0.350–4.500)

## 2017-01-31 LAB — MAGNESIUM: MAGNESIUM: 1.5 mg/dL — AB (ref 1.7–2.4)

## 2017-01-31 MED ORDER — ONDANSETRON 4 MG PO TBDP
4.0000 mg | ORAL_TABLET | Freq: Once | ORAL | Status: AC
  Administered 2017-01-31: 4 mg via ORAL
  Filled 2017-01-31: qty 1

## 2017-01-31 MED ORDER — DIAZEPAM 5 MG PO TABS
5.0000 mg | ORAL_TABLET | Freq: Two times a day (BID) | ORAL | Status: DC | PRN
  Administered 2017-01-31: 5 mg via ORAL
  Filled 2017-01-31: qty 1

## 2017-01-31 MED ORDER — MAGNESIUM SULFATE 2 GM/50ML IV SOLN
2.0000 g | Freq: Once | INTRAVENOUS | Status: AC
  Administered 2017-01-31: 2 g via INTRAVENOUS
  Filled 2017-01-31: qty 50

## 2017-01-31 MED ORDER — TRAZODONE HCL 50 MG PO TABS
300.0000 mg | ORAL_TABLET | Freq: Once | ORAL | Status: AC
  Administered 2017-01-31: 300 mg via ORAL
  Filled 2017-01-31: qty 6

## 2017-01-31 MED ORDER — ONDANSETRON HCL 4 MG PO TABS: 4.0000 mg | ORAL_TABLET | Freq: Four times a day (QID) | ORAL | Status: DC | PRN

## 2017-01-31 MED ORDER — LEVOTHYROXINE SODIUM 125 MCG PO TABS
125.0000 ug | ORAL_TABLET | Freq: Every day | ORAL | Status: DC
  Administered 2017-02-01: 125 ug via ORAL
  Filled 2017-01-31: qty 1

## 2017-01-31 MED ORDER — ATORVASTATIN CALCIUM 20 MG PO TABS
20.0000 mg | ORAL_TABLET | Freq: Every day | ORAL | Status: DC
  Administered 2017-01-31: 20 mg via ORAL
  Filled 2017-01-31: qty 1

## 2017-01-31 MED ORDER — POTASSIUM CHLORIDE IN NACL 40-0.9 MEQ/L-% IV SOLN
INTRAVENOUS | Status: DC
  Administered 2017-01-31: 100 mL/h via INTRAVENOUS
  Filled 2017-01-31 (×2): qty 1000

## 2017-01-31 MED ORDER — SODIUM CHLORIDE 0.9 % IV BOLUS (SEPSIS)
1000.0000 mL | Freq: Once | INTRAVENOUS | Status: AC
  Administered 2017-01-31: 1000 mL via INTRAVENOUS

## 2017-01-31 MED ORDER — POTASSIUM CHLORIDE CRYS ER 20 MEQ PO TBCR
40.0000 meq | EXTENDED_RELEASE_TABLET | Freq: Two times a day (BID) | ORAL | Status: DC
  Administered 2017-01-31 (×2): 40 meq via ORAL
  Filled 2017-01-31 (×2): qty 2

## 2017-01-31 MED ORDER — ONDANSETRON HCL 4 MG/2ML IJ SOLN: 4.0000 mg | Freq: Four times a day (QID) | INTRAMUSCULAR | Status: DC | PRN

## 2017-01-31 MED ORDER — SERTRALINE HCL 50 MG PO TABS
75.0000 mg | ORAL_TABLET | Freq: Every day | ORAL | Status: DC
  Administered 2017-02-01: 75 mg via ORAL
  Filled 2017-01-31: qty 2

## 2017-01-31 MED ORDER — HYDROXYZINE PAMOATE 25 MG PO CAPS
25.0000 mg | ORAL_CAPSULE | Freq: Three times a day (TID) | ORAL | Status: DC | PRN
  Filled 2017-01-31: qty 1

## 2017-01-31 MED ORDER — ENOXAPARIN SODIUM 40 MG/0.4ML ~~LOC~~ SOLN
40.0000 mg | SUBCUTANEOUS | Status: DC
  Administered 2017-01-31: 40 mg via SUBCUTANEOUS
  Filled 2017-01-31: qty 0.4

## 2017-01-31 MED ORDER — ACETAMINOPHEN 325 MG PO TABS
650.0000 mg | ORAL_TABLET | Freq: Four times a day (QID) | ORAL | Status: DC | PRN
  Administered 2017-01-31 (×2): 650 mg via ORAL
  Filled 2017-01-31 (×2): qty 2

## 2017-01-31 MED ORDER — ACETAMINOPHEN 650 MG RE SUPP: 650.0000 mg | Freq: Four times a day (QID) | RECTAL | Status: DC | PRN

## 2017-01-31 MED ORDER — GABAPENTIN 300 MG PO CAPS
300.0000 mg | ORAL_CAPSULE | Freq: Three times a day (TID) | ORAL | Status: DC
  Administered 2017-01-31 – 2017-02-01 (×3): 300 mg via ORAL
  Filled 2017-01-31 (×3): qty 1

## 2017-01-31 NOTE — ED Triage Notes (Signed)
Pt coming from via EMS and presents with left leg pain.  Pt reports that last night around 20:00 pt was lying in bed and began to have pain.  Pt reports pain begins in her hip and goes down to her toes.  Pt hx of restless syndrome but usually does not have issues with it.  Pt reports the left leg was also restless.  EMS reports that pt was able to ambulated from her house to the truck with a steady gait.  Pt a/o x 4.

## 2017-01-31 NOTE — Progress Notes (Signed)
*  Preliminary Results* Left lower extremity venous duplex completed. Left lower extremity is negative for deep vein thrombosis. There is no evidence of left Baker's cyst.  01/31/2017 11:53 AM  Maudry Mayhew, BS, RVT, RDCS, RDMS

## 2017-01-31 NOTE — ED Provider Notes (Signed)
[facility] DEPT Provider Note   CSN: [id number] Arrival date & time: 01/31/17  [id number]     History   Chief Complaint Chief Complaint  Patient presents with  . Leg Pain    Left  . Nausea    HPI Audrey Meyer is a 61 y.o. female.  The history is provided by the patient.  Leg Pain   This is a new problem. The current episode started yesterday. The problem occurs constantly. The problem has been gradually improving. The pain is present in the left upper leg and left lower leg. The quality of the pain is described as aching. Pain scale: last night was severe, currently mild. Pertinent negatives include no numbness, full range of motion and no stiffness. She has tried nothing for the symptoms. There has been no history of extremity trauma.   Reports just returning from a trip to [place] where they changed her BP meds due to electrolyte abnormalities.  Past Medical History:  Diagnosis Date  . Acid reflux   . Breast cancer ([place])   . COPD (chronic obstructive pulmonary disease) ([place])   . High cholesterol   . Hypertension   . Hypothyroid   . Schizoaffective disorder     Patient Active Problem List   Diagnosis Date Noted  . Hyponatremia 01/31/2017  . Major depressive disorder, recurrent episode, moderate ([place]) 04/28/2016  . Chronic obstructive pulmonary disease ([place])   . Hypokalemia 03/07/2016  . Hypothyroidism 03/07/2016  . Essential hypertension 03/07/2016  . Tobacco abuse 03/07/2016    Past Surgical History:  Procedure Laterality Date  . APPENDECTOMY    . MASTECTOMY, PARTIAL    . TUBAL LIGATION      OB History    No data available       Home Medications    Prior to Admission medications   Medication Sig Start Date End Date Taking? Authorizing Provider  diazepam (VALIUM) 5 MG tablet Take 5 mg by mouth every 12 (twelve) hours as needed for anxiety or muscle spasms.  12/23/16  Yes Historical Provider, MD  gabapentin (NEURONTIN) 300 MG capsule Take 300 mg by mouth 3  (three) times daily.   Yes Historical Provider, MD  hydrALAZINE (APRESOLINE) 50 MG tablet Take 1.5 tablets (75 mg total) by mouth 3 (three) times daily. 05/05/16  Yes Benjamine Mola, FNP  hydrOXYzine (VISTARIL) 25 MG capsule Take 1 capsule (25 mg total) by mouth 3 (three) times daily as needed for anxiety. 05/05/16  Yes Benjamine Mola, FNP  levothyroxine (SYNTHROID, LEVOTHROID) 125 MCG tablet Take 125 mcg by mouth daily before breakfast.   Yes Historical Provider, MD  lisinopril (PRINIVIL,ZESTRIL) 10 MG tablet Take 10 mg by mouth daily.   Yes Historical Provider, MD  ondansetron (ZOFRAN) 4 MG tablet Take 1 tablet (4 mg total) by mouth every 6 (six) hours as needed for nausea. [id number]  Yes Delora Fuel, MD  promethazine (PHENERGAN) 25 MG tablet Take 25 mg by mouth every 8 (eight) hours as needed for nausea or vomiting.   Yes Historical Provider, MD  sertraline (ZOLOFT) 25 MG tablet Take 75 mg by mouth daily.   Yes Historical Provider, MD  trazodone (DESYREL) 300 MG tablet Take 300 mg by mouth at bedtime.   Yes Historical Provider, MD  atorvastatin (LIPITOR) 20 MG tablet Take 1 tablet (20 mg total) by mouth at bedtime. Patient taking differently: Take 20 mg by mouth daily.  05/05/16   Benjamine Mola, FNP  potassium chloride SA (K-DUR,KLOR-CON) 20 MEQ  tablet Take 1 tablet (20 mEq total) by mouth 2 (two) times daily. Patient not taking: Reported on 01/31/2017 05/05/16   Benjamine Mola, FNP  spironolactone (ALDACTONE) 25 MG tablet Take 1 tablet (25 mg total) by mouth daily. Patient not taking: Reported on 01/31/2017 05/05/16   Benjamine Mola, FNP  traZODone (DESYREL) 150 MG tablet Take 0.5 tablets (75 mg total) by mouth at bedtime as needed for sleep. Patient not taking: Reported on 01/20/2017 05/05/16   Benjamine Mola, FNP    Family History Family History  Problem Relation Age of Onset  . Heart attack Father   . Cancer Other     Social History Social History  Substance Use Topics  . Smoking status: Current  Every Day Smoker    Packs/day: 0.00    Years: 2.00    Types: Cigars  . Smokeless tobacco: Never Used  . Alcohol use No     Allergies   Budesonide   Review of Systems Review of Systems  Musculoskeletal: Negative for stiffness.  Neurological: Negative for numbness.  Ten systems are reviewed and are negative for acute change except as noted in the HPI    Physical Exam Updated Vital Signs BP 111/68 (BP Location: Left Arm)   Pulse (!) 59   Temp 98.1 F (36.7 C) (Oral)   Resp 18   Ht 5\' 2"  (1.575 m)   Wt 133 lb (60.3 kg)   SpO2 100%   BMI 24.33 kg/m   Physical Exam  Constitutional: She is oriented to person, place, and time. She appears well-developed and well-nourished. No distress.  HENT:  Head: Normocephalic and atraumatic.  Right Ear: External ear normal.  Left Ear: External ear normal.  Nose: Nose normal.  Eyes: Conjunctivae and EOM are normal. No scleral icterus.  Neck: Normal range of motion and phonation normal.  Cardiovascular: Normal rate and regular rhythm.   Pulmonary/Chest: Effort normal. No stridor. No respiratory distress.  Abdominal: She exhibits no distension.  Musculoskeletal: Normal range of motion. She exhibits no edema.       Left upper leg: She exhibits tenderness (mild discomfort). She exhibits no swelling and no edema.       Left lower leg: She exhibits tenderness (mild discomfort). She exhibits no swelling and no edema.  Neurological: She is alert and oriented to person, place, and time.  Skin: She is not diaphoretic.  Psychiatric: She has a normal mood and affect. Her behavior is normal.  Vitals reviewed.    ED Treatments / Results  Labs (all labs ordered are listed, but only abnormal results are displayed) Labs Reviewed  BASIC METABOLIC PANEL - Abnormal; Notable for the following:       Result Value   Sodium 124 (*)    Potassium 2.8 (*)    Chloride 86 (*)    All other components within normal limits  CBC WITH DIFFERENTIAL/PLATELET  - Abnormal; Notable for the following:    HCT 35.6 (*)    All other components within normal limits  MAGNESIUM - Abnormal; Notable for the following:    Magnesium 1.5 (*)    All other components within normal limits  TSH    EKG  EKG Interpretation None       Radiology No results found.  Procedures Procedures (including critical care time) CRITICAL CARE Performed by: Grayce Sessions Cardama Total critical care time: 35 minutes Critical care time was exclusive of separately billable procedures and treating other patients. Critical care was necessary to treat  or prevent imminent or life-threatening deterioration. Critical care was time spent personally by me on the following activities: development of treatment plan with patient and/or surrogate as well as nursing, discussions with consultants, evaluation of patient's response to treatment, examination of patient, obtaining history from patient or surrogate, ordering and performing treatments and interventions, ordering and review of laboratory studies, ordering and review of radiographic studies, pulse oximetry and re-evaluation of patient's condition.   Medications Ordered in ED Medications  potassium chloride SA (K-DUR,KLOR-CON) CR tablet 40 mEq (40 mEq Oral Given 01/31/17 1145)  atorvastatin (LIPITOR) tablet 20 mg (not administered)  diazepam (VALIUM) tablet 5 mg (not administered)  gabapentin (NEURONTIN) capsule 300 mg (not administered)  hydrOXYzine (VISTARIL) capsule 25 mg (not administered)  levothyroxine (SYNTHROID, LEVOTHROID) tablet 125 mcg (not administered)  sertraline (ZOLOFT) tablet 75 mg (not administered)  ondansetron (ZOFRAN-ODT) disintegrating tablet 4 mg (4 mg Oral Given 01/31/17 0943)  sodium chloride 0.9 % bolus 1,000 mL (1,000 mLs Intravenous New Bag/Given 01/31/17 1202)     Initial Impression / Assessment and Plan / ED Course  I have reviewed the triage vital signs and the nursing notes.  Pertinent labs &  imaging results that were available during my care of the patient were reviewed by me and considered in my medical decision making (see chart for details).     Ultrasound negative for DVT. Leg pain likely secondary to muscle spasm which is likely secondary to her hypokalemia. Patient was complaining of nausea. She was found to have a significant hyponatremia at 124. Otherwise she is asymptomatic. Started on normal saline IV fluid bolus and given oral potassium for repletion. Will be admitted to hospitalist service for continued workup and management.  Final Clinical Impressions(s) / ED Diagnoses   Final diagnoses:  Nausea  Left leg pain  Hyponatremia  Hypokalemia      Fatima Blank, MD 01/31/17 1209

## 2017-01-31 NOTE — ED Notes (Signed)
Pt ambulates in hallway with a steady gait.

## 2017-01-31 NOTE — ED Triage Notes (Signed)
Pt also states she has nausea and hasn't been able to "eat a good meal since February 7th."

## 2017-01-31 NOTE — H&P (Addendum)
History and Physical  Audrey Meyer [id number] DOB: 01/09/1956 DOA: 01/31/2017  Referring physician: Addison Lank, ER physician  PCP: Dwan Bolt, MD  Outpatient Specialists: None Patient coming from: Home & is able to ambulate without assistance  Chief Complaint: Leg cramping   HPI: Audrey Meyer is a 61 y.o. female with medical history significant of COPD, schizo effective disorder and essential hypertension starting several weeks ago noticed change in her appetite. Patient stated that she was nauseated a lot and has not really felt like eating. She reports normal bowel movements and then no abdominal pain or vomiting. The symptoms continued so patient was in [place] visiting her daughter and so she went to an urgent care. She brought the paperwork from that visit although the labs are not listed. Reportedly due to electrolyte abnormalities, patient's Tenoretic, Aldactone and potassium supplements were stopped and patient was started on lisinopril. She was sent home. This was last week. Patient's symptoms of nausea persisted and then last night she started having severe leg cramping starting from her left thigh down to her ankle. She describes it as quite severe and unrelenting. She became concerned she came into the emergency room today.   ED Course: In the emergency room, patient was found to have a sodium of 124 with a potassium of 2.8. Magnesium level was checked and found to be a 1.5. Patient was given oral potassium supplement and IV fluids and hospitalists were called for further evaluation. It was suspected that her leg cramping was from hypokalemia, but to ensure that there was no DVT lower extremity Doppler was done which was negative.  Review of Systems: Patient seen after arrival to floor . Pt complains of no appetite and nausea. Her left leg cramping has gotten better   Pt denies any headaches, vision changes, dysphagia, chest pain, palpitations, shortness of  breath, wheeze, cough, abdominal pain, hematuria, dysuria, constipation, diarrhea, focal extremity numbness or weakness or pain other than described above .  Review of systems are otherwise negative   Past Medical History:  Diagnosis Date  . Acid reflux   . Breast cancer ([place])   . COPD (chronic obstructive pulmonary disease) ([place])   . High cholesterol   . Hypertension   . Hypothyroid   . Schizoaffective disorder    Past Surgical History:  Procedure Laterality Date  . APPENDECTOMY    . MASTECTOMY, PARTIAL    . TUBAL LIGATION      Social History:  reports that she has been smoking Cigars.  She has been smoking about 0.00 packs per day for the past 2.00 years. She has never used smokeless tobacco. She reports that she does not drink alcohol or use drugs.   Allergies  Allergen Reactions  . Budesonide Shortness Of Breath    Family History  Problem Relation Age of Onset  . Heart attack Father   . Cancer Other       Prior to Admission medications   Medication Sig Start Date End Date Taking? Authorizing Provider  diazepam (VALIUM) 5 MG tablet Take 5 mg by mouth every 12 (twelve) hours as needed for anxiety or muscle spasms.  12/23/16  Yes Historical Provider, MD  gabapentin (NEURONTIN) 300 MG capsule Take 300 mg by mouth 3 (three) times daily.   Yes Historical Provider, MD  hydrALAZINE (APRESOLINE) 50 MG tablet Take 1.5 tablets (75 mg total) by mouth 3 (three) times daily. 05/05/16  Yes Benjamine Mola, FNP  hydrOXYzine (VISTARIL) 25 MG capsule Take 1  capsule (25 mg total) by mouth 3 (three) times daily as needed for anxiety. 05/05/16  Yes Benjamine Mola, FNP  levothyroxine (SYNTHROID, LEVOTHROID) 125 MCG tablet Take 125 mcg by mouth daily before breakfast.   Yes Historical Provider, MD  lisinopril (PRINIVIL,ZESTRIL) 10 MG tablet Take 10 mg by mouth daily.   Yes Historical Provider, MD  ondansetron (ZOFRAN) 4 MG tablet Take 1 tablet (4 mg total) by mouth every 6 (six) hours as needed for  nausea. [id number]  Yes Delora Fuel, MD  promethazine (PHENERGAN) 25 MG tablet Take 25 mg by mouth every 8 (eight) hours as needed for nausea or vomiting.   Yes Historical Provider, MD  sertraline (ZOLOFT) 25 MG tablet Take 75 mg by mouth daily.   Yes Historical Provider, MD  trazodone (DESYREL) 300 MG tablet Take 300 mg by mouth at bedtime.   Yes Historical Provider, MD  atorvastatin (LIPITOR) 20 MG tablet Take 1 tablet (20 mg total) by mouth at bedtime. Patient taking differently: Take 20 mg by mouth daily.  05/05/16   Benjamine Mola, FNP  potassium chloride SA (K-DUR,KLOR-CON) 20 MEQ tablet Take 1 tablet (20 mEq total) by mouth 2 (two) times daily. Patient not taking: Reported on 01/31/2017 05/05/16   Benjamine Mola, FNP  spironolactone (ALDACTONE) 25 MG tablet Take 1 tablet (25 mg total) by mouth daily. Patient not taking: Reported on 01/31/2017 05/05/16   Benjamine Mola, FNP  traZODone (DESYREL) 150 MG tablet Take 0.5 tablets (75 mg total) by mouth at bedtime as needed for sleep. Patient not taking: Reported on 01/20/2017 05/05/16   Benjamine Mola, FNP    Physical Exam: BP 120/79 (BP Location: Left Arm)   Pulse (!) 59   Temp 97.9 F (36.6 C) (Oral)   Resp 17   Ht 5\' 2"  (1.575 m)   Wt 60.3 kg (133 lb)   SpO2 96%   BMI 24.33 kg/m   General:  Alert and oriented 3, no acute distress  Eyes: Sclera nonicteric, extraocular movements are intact  ENT: Normocephalic, atraumatic, mucous membranes are slightly dry  Neck: Supple, no JVD  Cardiovascular: Regular rate and rhythm, S1-S2  Respiratory: Clear to auscultation bilaterally  Abdomen: Soft, nontender, nondistended, hypoactive bowel sounds  Skin: No skin breaks, tears or lesions  Musculoskeletal: No clubbing or cyanosis or edema  Psychiatric: Flattened affect, otherwise patient appropriate, no evidence of psychoses  Neurologic: No focal deficits          Labs on Admission:  Basic Metabolic Panel:  Recent Labs Lab 01/31/17 0942  NA 124*    K 2.8*  CL 86*  CO2 28  GLUCOSE 90  BUN 8  CREATININE 0.74  CALCIUM 8.9  MG 1.5*   Liver Function Tests: No results for input(s): AST, ALT, ALKPHOS, BILITOT, PROT, ALBUMIN in the last 168 hours. No results for input(s): LIPASE, AMYLASE in the last 168 hours. No results for input(s): AMMONIA in the last 168 hours. CBC:  Recent Labs Lab 01/31/17 0942  WBC 7.2  NEUTROABS 5.4  HGB 12.8  HCT 35.6*  MCV 82.0  PLT 235   Cardiac Enzymes: No results for input(s): CKTOTAL, CKMB, CKMBINDEX, TROPONINI in the last 168 hours.  BNP (last 3 results) No results for input(s): BNP in the last 8760 hours.  ProBNP (last 3 results) No results for input(s): PROBNP in the last 8760 hours.  CBG: No results for input(s): GLUCAP in the last 168 hours.  Radiological Exams on Admission: No results  found.  EKG: Not done   Assessment/Plan Present on Admission: . Hyponatremia: Secondary to poor by mouth intake? We'll gently hydrate and reassess.  . Hypokalemia: Patient used to be on potassium supplement and this was stopped, I suspect secondary to hyperkalemia. Regardless, we will replace magnesium and potassium.  Marland Kitchen Hypothyroidism: Continue Synthroid. Checked TSH which was normal.  . Essential hypertension: Urgent care recently changed patient's medications, stopping her Tenoretic and Aldactone and potassium and putting her on an ACE inhibitor. I do not have the labs from the urgent care to know why this was done. Regardless, we'll plan to gently hydrate and defer to her PCP about further changes in her medications. I saw the patient, it was important to stick with her primary care doctor and she runs the risk of medicines being mixed up if she goes to different places. She says she understands.  . Tobacco abuse: Patient states that with her being ill, she is only smoked very few cigarettes in the past few weeks. I offered a nicotine patch and she declined at this time.  . Chronic obstructive  pulmonary disease ([place]): Stable.  Marland Kitchen Hypomagnesemia: Will replace. This accounts for part for her hypokalemia.  Nausea or by mouth intake: Patient states that she has not had a good meal in almost 2 weeks. She has not had any episodes of throwing up. She advocates normal bowel movements which has not changed. Serum albumin checked and found to be normal. This may be more in her mind.  Schizo effective disorder: Stable  Active Problems:   Hypokalemia   Hypothyroidism   Essential hypertension   Tobacco abuse   Chronic obstructive pulmonary disease ([facility])   Hyponatremia   Hypomagnesemia   DVT prophylaxis: Lovenox   Code Status: Full code as per patient   Family Communication: No family present, left message for daughter   Disposition Plan: Anticipate discharge home tomorrow once electrolytes improved  Consults called: None   Admission status: Given the patient is expected to go home tomorrow, have placed her under observation     Annita Brod MD [facility] Pager [phone]  If 7PM-7AM, please contact night-coverage [email or web address] Password [facility]  01/31/2017, 3:28 PM

## 2017-01-31 NOTE — ED Notes (Signed)
Bed: [id number] Expected date:  Expected time:  Means of arrival:  Comments: EMS- 61yo F, LLE pain/Hx of restless leg syndrome

## 2017-02-01 DIAGNOSIS — E871 Hypo-osmolality and hyponatremia: Secondary | ICD-10-CM

## 2017-02-01 LAB — BASIC METABOLIC PANEL
ANION GAP: 3 — AB (ref 5–15)
ANION GAP: 6 (ref 5–15)
BUN: 9 mg/dL (ref 6–20)
BUN: 6 mg/dL (ref 6–20)
CO2: 27 mmol/L (ref 22–32)
CO2: 28 mmol/L (ref 22–32)
Calcium: 8 mg/dL — ABNORMAL LOW (ref 8.9–10.3)
Calcium: 8.1 mg/dL — ABNORMAL LOW (ref 8.9–10.3)
Chloride: 98 mmol/L — ABNORMAL LOW (ref 101–111)
Chloride: 97 mmol/L — ABNORMAL LOW (ref 101–111)
Creatinine, Ser: 0.71 mg/dL (ref 0.44–1.00)
Creatinine, Ser: 0.77 mg/dL (ref 0.44–1.00)
GFR calc Af Amer: >60 mL/min (ref >60)
GFR calc Af Amer: >60 mL/min (ref >60)
GLUCOSE: 93 mg/dL (ref 65–99)
GLUCOSE: 140 mg/dL — AB (ref 65–99)
POTASSIUM: 4.1 mmol/L (ref 3.5–5.1)
POTASSIUM: 3.4 mmol/L — AB (ref 3.5–5.1)
Sodium: 128 mmol/L — ABNORMAL LOW (ref 135–145)
Sodium: 131 mmol/L — ABNORMAL LOW (ref 135–145)

## 2017-02-01 LAB — MAGNESIUM: Magnesium: 2 mg/dL (ref 1.7–2.4)

## 2017-02-01 LAB — HIV ANTIBODY (ROUTINE TESTING W REFLEX): HIV SCREEN 4TH GENERATION: NONREACTIVE

## 2017-02-01 MED ORDER — SODIUM CHLORIDE 0.9 % IV SOLN
INTRAVENOUS | Status: DC
  Administered 2017-02-01: 08:00:00 via INTRAVENOUS

## 2017-02-01 MED ORDER — PANTOPRAZOLE SODIUM 40 MG PO TBEC
40.0000 mg | DELAYED_RELEASE_TABLET | Freq: Every day | ORAL | Status: DC
  Administered 2017-02-01: 40 mg via ORAL
  Filled 2017-02-01: qty 1

## 2017-02-01 MED ORDER — POTASSIUM CHLORIDE CRYS ER 20 MEQ PO TBCR
40.0000 meq | EXTENDED_RELEASE_TABLET | Freq: Once | ORAL | Status: AC
  Administered 2017-02-01: 40 meq via ORAL
  Filled 2017-02-01: qty 2

## 2017-02-01 NOTE — Discharge Summary (Signed)
Physician Discharge Summary  Audrey Meyer [id number] DOB: 1956-09-09 DOA: 01/31/2017  PCP: Dwan Bolt, MD  Admit date: 01/31/2017 Discharge date: 02/01/2017  Admitted From: Home Disposition:  Home  Recommendations for Outpatient Follow-up:  1. Follow up with PCP in 2-3 days 2. Please obtain BMP and Mg in 2-3 days    Home Health: No  Equipment/Devices: None   Discharge Condition: Stable CODE STATUS: Full  Diet recommendation: Heart healthy   Brief/Interim Summary: From H&P: Audrey Meyer is a 61 y.o. female with medical history significant of COPD, schizo effective disorder and essential hypertension starting several weeks ago noticed change in her appetite. Patient stated that she was nauseated a lot and has not really felt like eating. She reports normal bowel movements and then no abdominal pain or vomiting. The symptoms continued so when patient was in [place] visiting her daughter last week, she decided to be evaluated at an Urgent Care. She brought the paperwork from that visit although the labs are not listed. Reportedly due to electrolyte abnormalities, patient's Tenoretic, Aldactone and potassium supplements were stopped and patient was started on lisinopril for hypertension. Patient's symptoms of nausea persisted and then last night she started having severe leg cramping starting from her left thigh down to her ankle. She describes it as quite severe and unrelenting. She became concerned she came into the emergency room.   ED Course: In the emergency room, patient was found to have a sodium of 124 with a potassium of 2.8. Magnesium level was checked and found to be a 1.5. Patient was given oral potassium supplement and IV fluids and hospitalists were called for further evaluation. It was suspected that her leg cramping was from hypokalemia, but to ensure that there was no DVT lower extremity Doppler was done which was negative.  Interim: Patient's potassium and  magnesium were replaced. She was given IVF which slowly improved her hyponatremia as well. Leg cramps improved. On day of discharge, patient had no complaints; appetite returned and ate pork roast last night and had eggs and toast for breakfast. Denies CP SOB N/V or abdominal pain. Has some heartburn after meals and states she used to take nexium.   Discharge Diagnoses:  Active Problems:   Hypokalemia   Hypothyroidism   Schizoaffective disorder (HCC)   Essential hypertension   Tobacco abuse   Chronic obstructive pulmonary disease (HCC)   Hyponatremia   Hypomagnesemia  Hyponatremia, likely secondary to poor oral intake -Improved with IV fluids -Sodium improved from 124 --> 131 in the last 24 hours -She will need close follow-up with primary care physician with repeat lab work in the next couple of days  Hypokalemia -She has previously been on Tenoretic, Aldactone, potassium supplement, which were all stopped at an Urgent Care due to electrolyte abnormalities. She was started on lisinopril. -Has been replaced. Again, she needs close follow-up with primary care physician with repeat lab work  Hypomagnesemia -Replaced   Essential hypertension -She has previously been on Tenoretic, Aldactone, potassium supplement, which were all stopped at an Urgent Care due to electrolyte abnormalities. She was started on lisinopril. -Blood pressure in the hospital has been stable. Will discharge on lisinopril, but she needs close follow-up with PCP in regards to her medication regimen.  Hypothyroidism -Continue synthroid   HLD -Continue lipitor  COPD -Stable   Schizoaffective disorder -Stable, continue zoloft, trazodone   Discharge Instructions  Discharge Instructions    Call MD for:  difficulty breathing, headache or visual disturbances  Complete by:  As directed    Call MD for:  extreme fatigue    Complete by:  As directed    Call MD for:  persistant dizziness or light-headedness     Complete by:  As directed    Call MD for:  persistant nausea and vomiting    Complete by:  As directed    Call MD for:  severe uncontrolled pain    Complete by:  As directed    Call MD for:  temperature >100.4    Complete by:  As directed    Diet - low sodium heart healthy    Complete by:  As directed    Discharge instructions    Complete by:  As directed    Recommendations for Outpatient Follow-up:  Follow up with PCP in 2-3 days regarding your blood pressure medication regimen Please obtain BMP and Mg in 2-3 days to check your sodium, potassium, magnesium levels   Increase activity slowly    Complete by:  As directed      Allergies as of 02/01/2017      Reactions   Budesonide Shortness Of Breath      Medication List    STOP taking these medications   hydrALAZINE 50 MG tablet Commonly known as:  APRESOLINE   potassium chloride SA 20 MEQ tablet Commonly known as:  K-DUR,KLOR-CON   spironolactone 25 MG tablet Commonly known as:  ALDACTONE     TAKE these medications   atorvastatin 20 MG tablet Commonly known as:  LIPITOR Take 1 tablet (20 mg total) by mouth at bedtime. What changed:  when to take this   diazepam 5 MG tablet Commonly known as:  VALIUM Take 5 mg by mouth every 12 (twelve) hours as needed for anxiety or muscle spasms.   gabapentin 300 MG capsule Commonly known as:  NEURONTIN Take 300 mg by mouth 3 (three) times daily.   hydrOXYzine 25 MG capsule Commonly known as:  VISTARIL Take 1 capsule (25 mg total) by mouth 3 (three) times daily as needed for anxiety.   levothyroxine 125 MCG tablet Commonly known as:  SYNTHROID, LEVOTHROID Take 125 mcg by mouth daily before breakfast.   lisinopril 10 MG tablet Commonly known as:  PRINIVIL,ZESTRIL Take 10 mg by mouth daily.   ondansetron 4 MG tablet Commonly known as:  ZOFRAN Take 1 tablet (4 mg total) by mouth every 6 (six) hours as needed for nausea.   promethazine 25 MG tablet Commonly known as:   PHENERGAN Take 25 mg by mouth every 8 (eight) hours as needed for nausea or vomiting.   sertraline 25 MG tablet Commonly known as:  ZOLOFT Take 75 mg by mouth daily.   trazodone 300 MG tablet Commonly known as:  DESYREL Take 300 mg by mouth at bedtime. What changed:  Another medication with the same name was removed. Continue taking this medication, and follow the directions you see here.      Follow-up Information    Dwan Bolt, MD. Schedule an appointment as soon as possible for a visit in 1 day(s).   Specialty:  Endocrinology Contact information: [address] [place] [address] [phone]          Allergies  Allergen Reactions  . Budesonide Shortness Of Breath    Consultations:  None   Procedures/Studies: No results found.    Discharge Exam: Vitals:   01/31/17 2022 02/01/17 0519  BP: 109/62 117/67  Pulse: (!) 56 (!) 55  Resp: 17  18  Temp: 98.4 F (36.9 C) 97.6 F (36.4 C)   Vitals:   01/31/17 1204 01/31/17 1326 01/31/17 2022 02/01/17 0519  BP: 143/62 120/79 109/62 117/67  Pulse: (!) 58 (!) 59 (!) 56 (!) 55  Resp: 17 17 17 18   Temp:  97.9 F (36.6 C) 98.4 F (36.9 C) 97.6 F (36.4 C)  TempSrc:  Oral Oral Oral  SpO2: 98% 96% 98% 97%  Weight:  60.3 kg (133 lb)    Height:  5\' 2"  (1.575 m)      General: Pt is alert, awake, not in acute distress Cardiovascular: RRR, S1/S2 +, no rubs, no gallops Respiratory: CTA bilaterally, no wheezing, no rhonchi Abdominal: Soft, NT, ND, bowel sounds + Extremities: no edema, no cyanosis    The results of significant diagnostics from this hospitalization (including imaging, microbiology, ancillary and laboratory) are listed below for reference.     Microbiology: No results found for this or any previous visit (from the past 240 hour(s)).   Labs: BNP (last 3 results) No results for input(s): BNP in the last 8760 hours. Basic Metabolic Panel:  Recent Labs Lab 01/31/17 0942  01/31/17 2122 02/01/17 0250 02/01/17 0848  NA 124* 128* 128* 131*  K 2.8* 3.3* 4.1 3.4*  CL 86* 95* 98* 97*  CO2 28 29 27 28   GLUCOSE 90 103* 93 140*  BUN 8 9 9 6   CREATININE 0.74 0.90 0.71 0.77  CALCIUM 8.9 8.2* 8.0* 8.1*  MG 1.5*  --  2.0  --    Liver Function Tests:  Recent Labs Lab 01/31/17 1458  AST 18  ALT 13*  ALKPHOS 60  BILITOT 0.7  PROT 6.8  ALBUMIN 4.3   No results for input(s): LIPASE, AMYLASE in the last 168 hours. No results for input(s): AMMONIA in the last 168 hours. CBC:  Recent Labs Lab 01/31/17 0942  WBC 7.2  NEUTROABS 5.4  HGB 12.8  HCT 35.6*  MCV 82.0  PLT 235   Cardiac Enzymes: No results for input(s): CKTOTAL, CKMB, CKMBINDEX, TROPONINI in the last 168 hours. BNP: Invalid input(s): POCBNP CBG: No results for input(s): GLUCAP in the last 168 hours. D-Dimer No results for input(s): DDIMER in the last 72 hours. Hgb A1c No results for input(s): HGBA1C in the last 72 hours. Lipid Profile No results for input(s): CHOL, HDL, LDLCALC, TRIG, CHOLHDL, LDLDIRECT in the last 72 hours. Thyroid function studies  Recent Labs  01/31/17 1157  TSH 2.549   Anemia work up No results for input(s): VITAMINB12, FOLATE, FERRITIN, TIBC, IRON, RETICCTPCT in the last 72 hours. Urinalysis    Component Value Date/Time   COLORURINE YELLOW 01/24/2017 1624   APPEARANCEUR HAZY (A) 01/24/2017 1624   LABSPEC 1.015 01/24/2017 1624   PHURINE 8.0 01/24/2017 1624   GLUCOSEU NEGATIVE 01/24/2017 1624   HGBUR NEGATIVE 01/24/2017 1624   BILIRUBINUR NEGATIVE 01/24/2017 1624   KETONESUR NEGATIVE 01/24/2017 1624   PROTEINUR NEGATIVE 01/24/2017 1624   UROBILINOGEN 0.2 09/19/2012 2334   NITRITE NEGATIVE 01/24/2017 1624   LEUKOCYTESUR LARGE (A) 01/24/2017 1624   Sepsis Labs Invalid input(s): PROCALCITONIN,  WBC,  LACTICIDVEN Microbiology No results found for this or any previous visit (from the past 240 hour(s)).   Time coordinating discharge: 45  minutes  SIGNED:  Dessa Phi, DO [facility] Pager [phone]  If 7PM-7AM, please contact night-coverage [email or web address] Password [email or web address] 02/01/2017, 11:18 AM

## 2017-02-03 ENCOUNTER — Emergency Department (HOSPITAL_COMMUNITY)
Admission: EM | Admit: 2017-02-03 | Discharge: 2017-02-03 | Disposition: A | Discharge status: Home / Self Care | Payer: Medicare Other | Attending: Emergency Medicine | Admitting: Emergency Medicine

## 2017-02-03 ENCOUNTER — Encounter (HOSPITAL_COMMUNITY): Payer: Self-pay | Admitting: Emergency Medicine

## 2017-02-03 DIAGNOSIS — J449 Chronic obstructive pulmonary disease, unspecified: Secondary | ICD-10-CM | POA: Diagnosis not present

## 2017-02-03 DIAGNOSIS — Z853 Personal history of malignant neoplasm of breast: Secondary | ICD-10-CM | POA: Diagnosis not present

## 2017-02-03 DIAGNOSIS — I10 Essential (primary) hypertension: Secondary | ICD-10-CM | POA: Insufficient documentation

## 2017-02-03 DIAGNOSIS — E871 Hypo-osmolality and hyponatremia: Secondary | ICD-10-CM | POA: Insufficient documentation

## 2017-02-03 DIAGNOSIS — M79605 Pain in left leg: Secondary | ICD-10-CM | POA: Diagnosis present

## 2017-02-03 DIAGNOSIS — E039 Hypothyroidism, unspecified: Secondary | ICD-10-CM | POA: Diagnosis not present

## 2017-02-03 DIAGNOSIS — F17290 Nicotine dependence, other tobacco product, uncomplicated: Secondary | ICD-10-CM | POA: Diagnosis not present

## 2017-02-03 DIAGNOSIS — Z79899 Other long term (current) drug therapy: Secondary | ICD-10-CM | POA: Insufficient documentation

## 2017-02-03 LAB — CBC
HEMATOCRIT: 35.1 % — AB (ref 36.0–46.0)
HEMOGLOBIN: 12.5 g/dL (ref 12.0–15.0)
MCH: 29.8 pg (ref 26.0–34.0)
MCHC: 35.6 g/dL (ref 30.0–36.0)
MCV: 83.8 fL (ref 78.0–100.0)
Platelets: 230 10*3/uL (ref 150–400)
RBC: 4.19 MIL/uL (ref 3.87–5.11)
RDW: 12.2 % (ref 11.5–15.5)
WBC: 9.6 10*3/uL (ref 4.0–10.5)

## 2017-02-03 LAB — BASIC METABOLIC PANEL
Anion gap: 10 (ref 5–15)
CHLORIDE: 88 mmol/L — AB (ref 101–111)
CO2: 24 mmol/L (ref 22–32)
CREATININE: 0.81 mg/dL (ref 0.44–1.00)
Calcium: 9.2 mg/dL (ref 8.9–10.3)
GFR calc Af Amer: >60 mL/min (ref >60)
GFR calc non Af Amer: >60 mL/min (ref >60)
Glucose, Bld: 117 mg/dL — ABNORMAL HIGH (ref 65–99)
Potassium: 3.8 mmol/L (ref 3.5–5.1)
Sodium: 122 mmol/L — ABNORMAL LOW (ref 135–145)

## 2017-02-03 LAB — MAGNESIUM: Magnesium: 1.2 mg/dL — ABNORMAL LOW (ref 1.7–2.4)

## 2017-02-03 MED ORDER — MAGNESIUM SULFATE IN D5W 1-5 GM/100ML-% IV SOLN
1.0000 g | Freq: Once | INTRAVENOUS | Status: AC
  Administered 2017-02-03: 1 g via INTRAVENOUS
  Filled 2017-02-03: qty 100

## 2017-02-03 MED ORDER — MAGNESIUM OXIDE 400 MG PO TABS: 800.0000 mg | ORAL_TABLET | Freq: Two times a day (BID) | ORAL | 0 refills | Status: DC

## 2017-02-03 MED ORDER — MAGNESIUM SULFATE 50 % IJ SOLN
3.0000 g | Freq: Once | INTRAVENOUS | Status: AC
  Administered 2017-02-03: 3 g via INTRAVENOUS
  Filled 2017-02-03: qty 6

## 2017-02-03 MED ORDER — SODIUM CHLORIDE 0.9 % IV BOLUS (SEPSIS)
1000.0000 mL | Freq: Once | INTRAVENOUS | Status: AC
  Administered 2017-02-03: 1000 mL via INTRAVENOUS

## 2017-02-03 MED ORDER — MAGNESIUM SULFATE 50 % IJ SOLN: 1.0000 g | Freq: Once | INTRAMUSCULAR | Status: DC

## 2017-02-03 NOTE — Discharge Instructions (Signed)
Return to the ED with any concerns including cramps, arm or leg weakness, swelling of legs, fainting, changes in vision or speech, or any other alarming symptoms  You should be sure to arrange for a recheck with your primary care doctor in the next week to have your blood levels rechecked

## 2017-02-03 NOTE — ED Notes (Signed)
Patient is A & O x4.  She understood discharge instructions. 

## 2017-02-03 NOTE — ED Provider Notes (Signed)
[facility] DEPT Provider Note   CSN: [id number] Arrival date & time: 02/03/17  0720     History   Chief Complaint Chief Complaint  Patient presents with  . Leg Pain    HPI Audrey Meyer is a 61 y.o. female.  HPI  Pt presenting with c/o left leg pain.  She was admitted 3 days ago for similar symptoms- she felt better after getting potassium and magnesium supplementation.  She states she felt improved over the past 2 days since discharge until this morning she had a sharp aching pain that recurred in her left leg that was the same as prior.  No injuries, no falls.  No swelling of legs.  She here are no other associated systemic symptoms, there are no other alleviating or modifying factors.   Past Medical History:  Diagnosis Date  . Acid reflux   . Breast cancer ([place])   . COPD (chronic obstructive pulmonary disease) ([place])   . High cholesterol   . Hypertension   . Hypothyroid   . Schizoaffective disorder     Patient Active Problem List   Diagnosis Date Noted  . Hyponatremia 01/31/2017  . Hypomagnesemia 01/31/2017  . Major depressive disorder, recurrent episode, moderate ([place]) 04/28/2016  . Chronic obstructive pulmonary disease ([place])   . Hypokalemia 03/07/2016  . Hypothyroidism 03/07/2016  . Schizoaffective disorder ([place]) 03/07/2016  . Essential hypertension 03/07/2016  . Tobacco abuse 03/07/2016    Past Surgical History:  Procedure Laterality Date  . APPENDECTOMY    . MASTECTOMY, PARTIAL    . TUBAL LIGATION      OB History    No data available       Home Medications    Prior to Admission medications   Medication Sig Start Date End Date Taking? Authorizing Provider  acetaminophen (TYLENOL) 500 MG tablet Take 1,000 mg by mouth every 6 (six) hours as needed for mild pain, moderate pain, fever or headache.   Yes Historical Provider, MD  albuterol (PROVENTIL HFA;VENTOLIN HFA) 108 (90 Base) MCG/ACT inhaler Inhale 2 puffs into the lungs every 6 (six) hours as  needed for wheezing or shortness of breath.   Yes Historical Provider, MD  atorvastatin (LIPITOR) 20 MG tablet Take 1 tablet (20 mg total) by mouth at bedtime. 05/05/16  Yes Benjamine Mola, FNP  diazepam (VALIUM) 5 MG tablet Take 5 mg by mouth every 12 (twelve) hours as needed for anxiety or muscle spasms.  12/23/16  Yes Historical Provider, MD  gabapentin (NEURONTIN) 300 MG capsule Take 300 mg by mouth 2 (two) times daily.    Yes Historical Provider, MD  hydrOXYzine (VISTARIL) 25 MG capsule Take 1 capsule (25 mg total) by mouth 3 (three) times daily as needed for anxiety. 05/05/16  Yes Benjamine Mola, FNP  levothyroxine (SYNTHROID, LEVOTHROID) 125 MCG tablet Take 125 mcg by mouth daily before breakfast. *Brand Name Only*   Yes Historical Provider, MD  lisinopril (PRINIVIL,ZESTRIL) 10 MG tablet Take 10 mg by mouth daily.   Yes Historical Provider, MD  ondansetron (ZOFRAN) 4 MG tablet Take 1 tablet (4 mg total) by mouth every 6 (six) hours as needed for nausea. [id number]  Yes Delora Fuel, MD  sertraline (ZOLOFT) 25 MG tablet Take 75 mg by mouth daily with breakfast.    Yes Historical Provider, MD  trazodone (DESYREL) 300 MG tablet Take 300 mg by mouth at bedtime.   Yes Historical Provider, MD  magnesium oxide (MAG-OX) 400 MG tablet Take 2 tablets (800 mg total)  by mouth 2 (two) times daily. 02/03/17   Alfonzo Beers, MD    Family History Family History  Problem Relation Age of Onset  . Heart attack Father   . Cancer Other     Social History Social History  Substance Use Topics  . Smoking status: Current Every Day Smoker    Packs/day: 0.00    Years: 2.00    Types: Cigars  . Smokeless tobacco: Never Used  . Alcohol use No     Allergies   Budesonide and Promethazine   Review of Systems Review of Systems  ROS reviewed and all otherwise negative except for mentioned in HPI   Physical Exam Updated Vital Signs BP 155/82 (BP Location: Left Arm)   Pulse 77   Temp 97.8 F (36.6 C) (Oral)    Resp 16   Ht 5\' 2"  (1.575 m)   Wt 133 lb (60.3 kg)   SpO2 97%   BMI 24.33 kg/m  Vitals reviewed Physical Exam Physical Examination: General appearance - alert, well appearing, and in no distress Mental status - alert, oriented to person, place, and time Eyes - no conjunctival injection ,no scleral icterus Chest - clear to auscultation, no wheezes, rales or rhonchi, symmetric air entry Heart - normal rate, regular rhythm, normal S1, S2, no murmurs, rubs, clicks or gallops Back exam - full range of motion, no tenderness, palpable spasm or pain on motion Musculoskeletal - no joint tenderness, deformity or swelling, feels pain going from upper right leg down to ankle, no bony point tenderness or joint pain with ROM Extremities - peripheral pulses normal, no pedal edema, no clubbing or cyanosis Skin - normal coloration and turgor, no rashes  ED Treatments / Results  Labs (all labs ordered are listed, but only abnormal results are displayed) Labs Reviewed  CBC - Abnormal; Notable for the following:       Result Value   HCT 35.1 (*)    All other components within normal limits  BASIC METABOLIC PANEL - Abnormal; Notable for the following:    Sodium 122 (*)    Chloride 88 (*)    Glucose, Bld 117 (*)    BUN <5 (*)    All other components within normal limits  MAGNESIUM - Abnormal; Notable for the following:    Magnesium 1.2 (*)    All other components within normal limits    EKG  EKG Interpretation  Date/Time:  Friday February 03 2017 09:20:45 EST Ventricular Rate:  59 PR Interval:    QRS Duration: 114 QT Interval:  479 QTC Calculation: 475 R Axis:   86 Text Interpretation:  Sinus rhythm Ventricular premature complex Borderline intraventricular conduction delay Since previous tracing rate slower, PVCs are new, IVCD is new Confirmed by Canary Brim  MD, Alonie Gazzola [phone]) on 02/03/2017 9:27:04 AM       Radiology No results found.  Procedures Procedures (including critical care  time)  Medications Ordered in ED Medications  sodium chloride 0.9 % bolus 1,000 mL (0 mLs Intravenous Stopped 02/03/17 1345)  magnesium sulfate IVPB 1 g 100 mL (0 g Intravenous Stopped 02/03/17 1034)  magnesium sulfate 3 g in dextrose 5 % 100 mL IVPB (0 g Intravenous Stopped 02/03/17 1330)     Initial Impression / Assessment and Plan / ED Course  I have reviewed the triage vital signs and the nursing notes.  Pertinent labs & imaging results that were available during my care of the patient were reviewed by me and considered in my medical decision  making (see chart for details).    10:02 AM d/w Dr. Allyson Sabal, hospitalist, she recommends IV fluids, 4grams magnesium oxide and discharge with 800mg  mag oxide supplements.  This way she can avoid hospitalization   Final Clinical Impressions(s) / ED Diagnoses   Final diagnoses:  Left leg pain  Hypomagnesemia  Hyponatremia    New Prescriptions Discharge Medication List as of 02/03/2017  1:42 PM    START taking these medications   Details  magnesium oxide (MAG-OX) 400 MG tablet Take 2 tablets (800 mg total) by mouth 2 (two) times daily., Starting Fri 02/03/2017, Print         Alfonzo Beers, MD 02/03/17 1535

## 2017-02-03 NOTE — ED Triage Notes (Signed)
Patient is from home.  Patient was discharged 02-01-17 for complaining of left leg pain.  She states her pain has not resolved since 2-28.   BP:  144/96 P:80 R:16  CBG:125

## 2017-02-03 NOTE — ED Notes (Signed)
Bed: [id number] Expected date:  Expected time:  Means of arrival:  Comments: 61 yo F  Leg pain, low potassium

## 2017-02-03 NOTE — ED Notes (Signed)
Patient ambulated to bathroom with no help

## 2017-02-04 ENCOUNTER — Emergency Department (HOSPITAL_COMMUNITY)
Admission: EM | Admit: 2017-02-04 | Discharge: 2017-02-04 | Disposition: A | Discharge status: Home / Self Care | Payer: Medicare Other | Attending: Emergency Medicine | Admitting: Emergency Medicine

## 2017-02-04 ENCOUNTER — Encounter (HOSPITAL_COMMUNITY): Payer: Self-pay | Admitting: Emergency Medicine

## 2017-02-04 DIAGNOSIS — I10 Essential (primary) hypertension: Secondary | ICD-10-CM | POA: Insufficient documentation

## 2017-02-04 DIAGNOSIS — G4700 Insomnia, unspecified: Secondary | ICD-10-CM | POA: Diagnosis present

## 2017-02-04 DIAGNOSIS — F17290 Nicotine dependence, other tobacco product, uncomplicated: Secondary | ICD-10-CM | POA: Insufficient documentation

## 2017-02-04 DIAGNOSIS — J449 Chronic obstructive pulmonary disease, unspecified: Secondary | ICD-10-CM | POA: Diagnosis not present

## 2017-02-04 DIAGNOSIS — Z79899 Other long term (current) drug therapy: Secondary | ICD-10-CM | POA: Insufficient documentation

## 2017-02-04 DIAGNOSIS — E039 Hypothyroidism, unspecified: Secondary | ICD-10-CM | POA: Diagnosis not present

## 2017-02-04 DIAGNOSIS — Z853 Personal history of malignant neoplasm of breast: Secondary | ICD-10-CM | POA: Insufficient documentation

## 2017-02-04 LAB — URINALYSIS, ROUTINE W REFLEX MICROSCOPIC
Bilirubin Urine: NEGATIVE
Glucose, UA: NEGATIVE mg/dL
Hgb urine dipstick: NEGATIVE
Ketones, ur: NEGATIVE mg/dL
Nitrite: NEGATIVE
Protein, ur: NEGATIVE mg/dL
Specific Gravity, Urine: 1.018 (ref 1.005–1.030)
pH: 7 (ref 5.0–8.0)

## 2017-02-04 MED ORDER — DIAZEPAM 5 MG PO TABS: 5.0000 mg | ORAL_TABLET | Freq: Every evening | ORAL | 0 refills | Status: DC | PRN

## 2017-02-04 MED ORDER — CEPHALEXIN 500 MG PO CAPS: 500.0000 mg | ORAL_CAPSULE | Freq: Two times a day (BID) | ORAL | 0 refills | Status: DC

## 2017-02-04 NOTE — ED Provider Notes (Signed)
[facility] DEPT Provider Note   CSN: [id number] Arrival date & time: 02/04/17  1548  By signing my name below, I, Audrey Meyer, attest that this documentation has been prepared under the direction and in the presence of [employer], PA-C. Electronically Signed: Sonum Meyer, Education administrator. 02/04/17. 6:10 PM.  History   Chief Complaint Chief Complaint  Patient presents with  . Insomnia  . Anxiety    The history is provided by the patient. No language interpreter was used.    HPI Comments: Audrey Meyer is a 61 y.o. female brought in by ambulance, who presents to the Emergency Department complaining of an episode of insomnia that occurred last night. She states she was having nightmares with associated agitation, confusion, and anxiety which caused her to lose sleep. She states she typically takes diazepam at night when she feels this way but has been out of this medication. She gets this filled by [facility] but missed her appointment last week because her PCP appointment ran late. She states she currently feels normal and is back to baseline. She notes having urinary frequency last night. She has been compliant with the rest of her medication. She was recently seen for leg pain and started on magnesium oxide; states the leg pain as resolved. No modifying factors.   Past Medical History:  Diagnosis Date  . Acid reflux   . Breast cancer ([place])   . COPD (chronic obstructive pulmonary disease) ([place])   . High cholesterol   . Hypertension   . Hypothyroid   . Schizoaffective disorder     Patient Active Problem List   Diagnosis Date Noted  . Hyponatremia 01/31/2017  . Hypomagnesemia 01/31/2017  . Major depressive disorder, recurrent episode, moderate ([place]) 04/28/2016  . Chronic obstructive pulmonary disease ([place])   . Hypokalemia 03/07/2016  . Hypothyroidism 03/07/2016  . Schizoaffective disorder ([place]) 03/07/2016  . Essential hypertension 03/07/2016  . Tobacco abuse 03/07/2016    Past  Surgical History:  Procedure Laterality Date  . APPENDECTOMY    . MASTECTOMY, PARTIAL    . TUBAL LIGATION      OB History    No data available       Home Medications    Prior to Admission medications   Medication Sig Start Date End Date Taking? Authorizing Provider  acetaminophen (TYLENOL) 500 MG tablet Take 1,000 mg by mouth every 6 (six) hours as needed for mild pain, moderate pain, fever or headache.    Historical Provider, MD  albuterol (PROVENTIL HFA;VENTOLIN HFA) 108 (90 Base) MCG/ACT inhaler Inhale 2 puffs into the lungs every 6 (six) hours as needed for wheezing or shortness of breath.    Historical Provider, MD  atorvastatin (LIPITOR) 20 MG tablet Take 1 tablet (20 mg total) by mouth at bedtime. 05/05/16   Benjamine Mola, FNP  cephALEXin (KEFLEX) 500 MG capsule Take 1 capsule (500 mg total) by mouth 2 (two) times daily. 02/04/17   Okey Regal, PA-C  diazepam (VALIUM) 5 MG tablet Take 1 tablet (5 mg total) by mouth at bedtime as needed for anxiety. 02/04/17   Okey Regal, PA-C  gabapentin (NEURONTIN) 300 MG capsule Take 300 mg by mouth 2 (two) times daily.     Historical Provider, MD  hydrOXYzine (VISTARIL) 25 MG capsule Take 1 capsule (25 mg total) by mouth 3 (three) times daily as needed for anxiety. 05/05/16   Benjamine Mola, FNP  levothyroxine (SYNTHROID, LEVOTHROID) 125 MCG tablet Take 125 mcg by mouth daily before breakfast. *Brand Name  Only*    Historical Provider, MD  lisinopril (PRINIVIL,ZESTRIL) 10 MG tablet Take 10 mg by mouth daily.    Historical Provider, MD  magnesium oxide (MAG-OX) 400 MG tablet Take 2 tablets (800 mg total) by mouth 2 (two) times daily. 02/03/17   Alfonzo Beers, MD  ondansetron (ZOFRAN) 4 MG tablet Take 1 tablet (4 mg total) by mouth every 6 (six) hours as needed for nausea. [id number]   Delora Fuel, MD  sertraline (ZOLOFT) 25 MG tablet Take 75 mg by mouth daily with breakfast.     Historical Provider, MD  trazodone (DESYREL) 300 MG tablet Take 300 mg by  mouth at bedtime.    Historical Provider, MD    Family History Family History  Problem Relation Age of Onset  . Heart attack Father   . Hypertension Father   . Cancer Other     Social History Social History  Substance Use Topics  . Smoking status: Current Every Day Smoker    Packs/day: 0.00    Years: 2.00    Types: Cigars  . Smokeless tobacco: Never Used  . Alcohol use No     Allergies   Budesonide and Promethazine   Review of Systems Review of Systems  Musculoskeletal: Negative for myalgias.  Psychiatric/Behavioral: Positive for agitation, confusion and sleep disturbance. The patient is nervous/anxious.      Physical Exam Updated Vital Signs BP 141/82   Pulse 73   Temp 99.1 F (37.3 C) (Oral)   Resp 16   Wt 60.3 kg   SpO2 98%   BMI 24.33 kg/m   Physical Exam  Constitutional: She is oriented to person, place, and time. She appears well-developed and well-nourished.  HENT:  Head: Normocephalic and atraumatic.  Cardiovascular: Normal rate.   Pulmonary/Chest: Effort normal.  Neurological: She is alert and oriented to person, place, and time.  Skin: Skin is warm and dry.  Psychiatric: She has a normal mood and affect.  Nursing note and vitals reviewed.    ED Treatments / Results  DIAGNOSTIC STUDIES: Oxygen Saturation is 98% on RA, normal by my interpretation.    COORDINATION OF CARE: 4:29 PM Discussed treatment plan with pt at bedside and pt agreed to plan.   Labs (all labs ordered are listed, but only abnormal results are displayed) Labs Reviewed  URINALYSIS, ROUTINE W REFLEX MICROSCOPIC - Abnormal; Notable for the following:       Result Value   APPearance HAZY (*)    Leukocytes, UA SMALL (*)    Bacteria, UA RARE (*)    Squamous Epithelial / LPF 6-30 (*)    All other components within normal limits  URINE CULTURE    EKG  EKG Interpretation None       Radiology No results found.  Procedures Procedures (including critical care  time)  Medications Ordered in ED Medications - No data to display   Initial Impression / Assessment and Plan / ED Course  I have reviewed the triage vital signs and the nursing notes.  Pertinent labs & imaging results that were available during my care of the patient were reviewed by me and considered in my medical decision making (see chart for details).      Final Clinical Impressions(s) / ED Diagnoses   Final diagnoses:  Insomnia, unspecified type    Well-appearing 61 year old female presents today with complaints of insomnia and anxiety.  Patient usually taking a benzodiazepine at night for sleeping.  She has not been able to follow-up at [facility] for  refill as she missed the appointment due to primary care provider visit.  Chart review shows that patient was receiving this medication, but did not make her appointment at the last visit.  She reports insomnia and anxiety.  Patient was seen yesterday for hypo-magnesium and hyponatremia with chief complaint of leg pain.  She notes after being treated here in the ED she had significant improvement in her symptoms and is asymptomatic at this time.  Patient has prescription for magnesium, she is instructed to continue using this as directed, she will be given a short course of diazepam as needed for sleep until she can follow-up with both her primary care and behavioral health specialist.  Patient also had urinary frequency last night, denies any today.  Her urine shows small leukocytes, WBC 6-30 with rare bacteria.  Unlikely urinary tract infection, patient will give a prescription for Keflex and encouraged to initiate antibiotic therapy if symptoms return and are persistent.  Patient verbalized understanding and agreement to today's plan.  She sure follow-up evaluation.  She had no further questions or concerns at the time of discharge  New Prescriptions Discharge Medication List as of 02/04/2017  5:41 PM    START taking these medications    Details  cephALEXin (KEFLEX) 500 MG capsule Take 1 capsule (500 mg total) by mouth 2 (two) times daily., Starting Sat 02/04/2017, Print       I personally performed the services described in this documentation, which was scribed in my presence. The recorded information has been reviewed and is accurate.    Okey Regal, PA-C 02/04/17 Iatan Yao, MD 02/04/17 [phone]

## 2017-02-04 NOTE — ED Triage Notes (Addendum)
Pt stated that she called EMS because she had nightmare, anxiety and confusion. Used inhaler last night.Pt stated that she feels much better now and wants a refill of diazepam. Pt stated that she had to use that bathroom frequently last night

## 2017-02-04 NOTE — ED Triage Notes (Signed)
Per EMS- pt called EMS for transport to Ed with c/o insomnia and anxiety. Pt is alert oriented and appropriate x4. Pt is concerned about the side effects of magnesium pills she is taking. Ran out of diazepam over one week ago. Needs to follow up with [facility]

## 2017-02-04 NOTE — Discharge Instructions (Signed)
Please read attached information. If you experience any new or worsening signs or symptoms please return to the emergency room for evaluation. Please follow-up with your primary care provider or specialist as discussed. Please use medication prescribed only as directed and discontinue taking if you have any concerning signs or symptoms.   If your urinary symptoms return please initiate antibiotic therapy.

## 2017-02-05 ENCOUNTER — Encounter (HOSPITAL_COMMUNITY): Payer: Self-pay

## 2017-02-05 ENCOUNTER — Emergency Department (HOSPITAL_COMMUNITY)
Admission: EM | Admit: 2017-02-05 | Discharge: 2017-02-05 | Disposition: A | Discharge status: Home / Self Care | Payer: Medicare Other | Attending: Emergency Medicine | Admitting: Emergency Medicine

## 2017-02-05 DIAGNOSIS — R110 Nausea: Secondary | ICD-10-CM | POA: Diagnosis not present

## 2017-02-05 DIAGNOSIS — F419 Anxiety disorder, unspecified: Secondary | ICD-10-CM | POA: Diagnosis not present

## 2017-02-05 DIAGNOSIS — I10 Essential (primary) hypertension: Secondary | ICD-10-CM | POA: Insufficient documentation

## 2017-02-05 DIAGNOSIS — E039 Hypothyroidism, unspecified: Secondary | ICD-10-CM | POA: Diagnosis not present

## 2017-02-05 DIAGNOSIS — Z853 Personal history of malignant neoplasm of breast: Secondary | ICD-10-CM | POA: Insufficient documentation

## 2017-02-05 DIAGNOSIS — J449 Chronic obstructive pulmonary disease, unspecified: Secondary | ICD-10-CM | POA: Insufficient documentation

## 2017-02-05 DIAGNOSIS — F17290 Nicotine dependence, other tobacco product, uncomplicated: Secondary | ICD-10-CM | POA: Insufficient documentation

## 2017-02-05 MED ORDER — ONDANSETRON 4 MG PO TBDP
4.0000 mg | ORAL_TABLET | Freq: Once | ORAL | Status: AC
  Administered 2017-02-05: 4 mg via ORAL
  Filled 2017-02-05: qty 1

## 2017-02-05 MED ORDER — ACETAMINOPHEN 500 MG PO TABS
1000.0000 mg | ORAL_TABLET | Freq: Once | ORAL | Status: AC
  Administered 2017-02-05: 1000 mg via ORAL
  Filled 2017-02-05: qty 2

## 2017-02-05 MED ORDER — ONDANSETRON 4 MG PO TBDP: 4.0000 mg | ORAL_TABLET | Freq: Three times a day (TID) | ORAL | 0 refills | Status: DC | PRN

## 2017-02-05 NOTE — ED Triage Notes (Signed)
Per EMS, pt from her apartment.  Pt seen yesterday for UTI.  Pt anxious and wants help getting in facility.  Pt denies pain.  Pt states she cannot take care of herself.  Vitals:  134/78, hr 96, resp 18,

## 2017-02-05 NOTE — ED Notes (Signed)
Bed: WTR8 Expected date:  Expected time:  Means of arrival:  Comments: 

## 2017-02-05 NOTE — ED Notes (Signed)
Patient wandering the halls looking for her room. Walked patient back to her room in triage.

## 2017-02-05 NOTE — ED Provider Notes (Signed)
[facility] DEPT Provider Note   CSN: [id number] Arrival date & time: 02/05/17  0901     History   Chief Complaint Chief Complaint  Patient presents with  . Anxiety    HPI Audrey Meyer is a 61 y.o. female.  The history is provided by the patient.  Anxiety  This is a recurrent problem. The current episode started 2 days ago. The problem occurs constantly. The problem has not changed since onset.Associated symptoms comments: Having nausea and urinary frequency. Nothing aggravates the symptoms. Nothing relieves the symptoms. Treatments tried: keflex for 2 doses. The treatment provided no relief.    Past Medical History:  Diagnosis Date  . Acid reflux   . Breast cancer ([place])   . COPD (chronic obstructive pulmonary disease) ([place])   . High cholesterol   . Hypertension   . Hypothyroid   . Schizoaffective disorder     Patient Active Problem List   Diagnosis Date Noted  . Hyponatremia 01/31/2017  . Hypomagnesemia 01/31/2017  . Major depressive disorder, recurrent episode, moderate ([place]) 04/28/2016  . Chronic obstructive pulmonary disease ([place])   . Hypokalemia 03/07/2016  . Hypothyroidism 03/07/2016  . Schizoaffective disorder ([place]) 03/07/2016  . Essential hypertension 03/07/2016  . Tobacco abuse 03/07/2016    Past Surgical History:  Procedure Laterality Date  . APPENDECTOMY    . MASTECTOMY, PARTIAL    . TUBAL LIGATION      OB History    No data available       Home Medications    Prior to Admission medications   Medication Sig Start Date End Date Taking? Authorizing Provider  acetaminophen (TYLENOL) 500 MG tablet Take 1,000 mg by mouth every 6 (six) hours as needed for mild pain, moderate pain, fever or headache.   Yes Historical Provider, MD  albuterol (PROVENTIL HFA;VENTOLIN HFA) 108 (90 Base) MCG/ACT inhaler Inhale 2 puffs into the lungs every 6 (six) hours as needed for wheezing or shortness of breath.   Yes Historical Provider, MD  atorvastatin  (LIPITOR) 20 MG tablet Take 1 tablet (20 mg total) by mouth at bedtime. 05/05/16  Yes Benjamine Mola, FNP  cephALEXin (KEFLEX) 500 MG capsule Take 1 capsule (500 mg total) by mouth 2 (two) times daily. 02/04/17  Yes Jeffrey Hedges, PA-C  diazepam (VALIUM) 5 MG tablet Take 1 tablet (5 mg total) by mouth at bedtime as needed for anxiety. 02/04/17  Yes Jeffrey Hedges, PA-C  gabapentin (NEURONTIN) 300 MG capsule Take 300 mg by mouth 2 (two) times daily.    Yes Historical Provider, MD  hydrOXYzine (VISTARIL) 25 MG capsule Take 1 capsule (25 mg total) by mouth 3 (three) times daily as needed for anxiety. 05/05/16  Yes Benjamine Mola, FNP  levothyroxine (SYNTHROID, LEVOTHROID) 125 MCG tablet Take 125 mcg by mouth daily before breakfast. *Brand Name Only*   Yes Historical Provider, MD  lisinopril (PRINIVIL,ZESTRIL) 10 MG tablet Take 10 mg by mouth daily.   Yes Historical Provider, MD  magnesium oxide (MAG-OX) 400 MG tablet Take 2 tablets (800 mg total) by mouth 2 (two) times daily. 02/03/17  Yes Alfonzo Beers, MD  ondansetron (ZOFRAN) 4 MG tablet Take 1 tablet (4 mg total) by mouth every 6 (six) hours as needed for nausea. [id number]  Yes Delora Fuel, MD  sertraline (ZOLOFT) 25 MG tablet Take 75 mg by mouth daily with breakfast.    Yes Historical Provider, MD  trazodone (DESYREL) 300 MG tablet Take 300 mg by mouth at bedtime.   Yes  Historical Provider, MD    Family History Family History  Problem Relation Age of Onset  . Heart attack Father   . Hypertension Father   . Cancer Other     Social History Social History  Substance Use Topics  . Smoking status: Current Every Day Smoker    Packs/day: 0.00    Years: 2.00    Types: Cigars  . Smokeless tobacco: Never Used  . Alcohol use No     Allergies   Budesonide and Promethazine   Review of Systems Review of Systems  All other systems reviewed and are negative.    Physical Exam Updated Vital Signs BP 164/92 (BP Location: Left Arm)   Pulse 68    Temp 98.1 F (36.7 C) (Oral)   Resp 18   SpO2 99%   Physical Exam  Constitutional: She is oriented to person, place, and time. She appears well-developed and well-nourished. No distress.  HENT:  Head: Normocephalic.  Nose: Nose normal.  Eyes: Conjunctivae are normal.  Neck: Neck supple. No tracheal deviation present.  Cardiovascular: Normal rate, regular rhythm and normal heart sounds.   Pulmonary/Chest: Effort normal and breath sounds normal. No respiratory distress.  Abdominal: Soft. She exhibits no distension. There is no tenderness. There is no guarding.  Neurological: She is alert and oriented to person, place, and time.  Skin: Skin is warm and dry.  Psychiatric: She has a normal mood and affect.     ED Treatments / Results  Labs (all labs ordered are listed, but only abnormal results are displayed) Labs Reviewed - No data to display  EKG  EKG Interpretation None       Radiology No results found.  Procedures Procedures (including critical care time)  Medications Ordered in ED Medications  acetaminophen (TYLENOL) tablet 1,000 mg (not administered)  ondansetron (ZOFRAN-ODT) disintegrating tablet 4 mg (not administered)     Initial Impression / Assessment and Plan / ED Course  I have reviewed the triage vital signs and the nursing notes.  Pertinent labs & imaging results that were available during my care of the patient were reviewed by me and considered in my medical decision making (see chart for details).     61 y.o. female presents with concern that she cannot take care of herself at home. AFVSS here. Recently diagnosed with UTI and undergoing treatment. She has been writing down her symptoms in a book which appear consistent with her current diagnosis. She has been able to buy and smoke cigarettes and she is texting her family members on her phone. She appears fully functional and there is no indication for admission or emergent placement. She states she  can follow up with PCP this week which was recommended to her for recheck. She was provided additional nausea medication for home until she can establish f/u.   Final Clinical Impressions(s) / ED Diagnoses   Final diagnoses:  Anxiety  Nausea    New Prescriptions Discharge Medication List as of 02/05/2017 12:40 PM    START taking these medications   Details  ondansetron (ZOFRAN ODT) 4 MG disintegrating tablet Take 1 tablet (4 mg total) by mouth every 8 (eight) hours as needed for nausea or vomiting., Starting Sun 02/05/2017, Print         Leo Grosser, MD 02/05/17 [phone]

## 2017-02-06 ENCOUNTER — Emergency Department (HOSPITAL_COMMUNITY)
Admission: EM | Admit: 2017-02-06 | Discharge: 2017-02-06 | Disposition: A | Discharge status: Home / Self Care | Payer: Medicare Other | Attending: Dermatology | Admitting: Dermatology

## 2017-02-06 ENCOUNTER — Emergency Department (HOSPITAL_COMMUNITY)
Admission: EM | Admit: 2017-02-06 | Discharge: 2017-02-06 | Disposition: A | Discharge status: Home / Self Care | Payer: Medicare Other | Attending: Emergency Medicine | Admitting: Emergency Medicine

## 2017-02-06 ENCOUNTER — Encounter (HOSPITAL_COMMUNITY): Payer: Self-pay | Admitting: Emergency Medicine

## 2017-02-06 ENCOUNTER — Encounter (HOSPITAL_COMMUNITY): Payer: Self-pay

## 2017-02-06 DIAGNOSIS — Z760 Encounter for issue of repeat prescription: Secondary | ICD-10-CM | POA: Diagnosis not present

## 2017-02-06 DIAGNOSIS — Z5321 Procedure and treatment not carried out due to patient leaving prior to being seen by health care provider: Secondary | ICD-10-CM | POA: Diagnosis not present

## 2017-02-06 DIAGNOSIS — R197 Diarrhea, unspecified: Secondary | ICD-10-CM | POA: Insufficient documentation

## 2017-02-06 DIAGNOSIS — M545 Low back pain: Secondary | ICD-10-CM | POA: Insufficient documentation

## 2017-02-06 DIAGNOSIS — J449 Chronic obstructive pulmonary disease, unspecified: Secondary | ICD-10-CM | POA: Insufficient documentation

## 2017-02-06 DIAGNOSIS — E039 Hypothyroidism, unspecified: Secondary | ICD-10-CM | POA: Insufficient documentation

## 2017-02-06 DIAGNOSIS — I10 Essential (primary) hypertension: Secondary | ICD-10-CM | POA: Diagnosis not present

## 2017-02-06 DIAGNOSIS — F17290 Nicotine dependence, other tobacco product, uncomplicated: Secondary | ICD-10-CM | POA: Diagnosis not present

## 2017-02-06 DIAGNOSIS — Z853 Personal history of malignant neoplasm of breast: Secondary | ICD-10-CM | POA: Insufficient documentation

## 2017-02-06 DIAGNOSIS — Z79899 Other long term (current) drug therapy: Secondary | ICD-10-CM | POA: Insufficient documentation

## 2017-02-06 DIAGNOSIS — F419 Anxiety disorder, unspecified: Secondary | ICD-10-CM | POA: Insufficient documentation

## 2017-02-06 LAB — URINE CULTURE

## 2017-02-06 MED ORDER — DIAZEPAM 5 MG PO TABS
5.0000 mg | ORAL_TABLET | Freq: Once | ORAL | Status: AC
  Administered 2017-02-06: 5 mg via ORAL
  Filled 2017-02-06: qty 1

## 2017-02-06 NOTE — ED Notes (Signed)
Pt reports that she needs a refill of the Diazapam 5mg  PO. Pt states that she was here the other day and when she came home she could not find the prescription.

## 2017-02-06 NOTE — ED Notes (Signed)
Bed: WTR9 Expected date:  Expected time:  Means of arrival:  Comments: 

## 2017-02-06 NOTE — Discharge Instructions (Signed)
You were seen today for a medication refill. You need to follow-up with your primary prescriber for any refills of Valium.

## 2017-02-06 NOTE — ED Notes (Signed)
Called pt for room placement no response. 

## 2017-02-06 NOTE — ED Triage Notes (Signed)
Pt states that she cant find her valium. She was supposed to take it at 2200. A&Ox4. Ambulatory. NAD.

## 2017-02-06 NOTE — ED Provider Notes (Signed)
[facility] DEPT Provider Note   CSN: [id number] Arrival date & time: 02/06/17  0032   By signing my name below, I, Audrey Meyer, attest that this documentation has been prepared under the direction and in the presence of Audrey Hacker, MD . Electronically Signed: Neta Meyer, ED Scribe. 02/06/2017. 1:54 AM.   History   Chief Complaint Chief Complaint  Patient presents with  . Medication Refill    The history is provided by the patient. No language interpreter was used.   HPI Comments:  Audrey Meyer is a 61 y.o. female who presents to the Emergency Department requesting a medication refill of 5mg  diazepam that she last took at 1000 yesterday. Pt reports that she lost her medication, and states that she was supposed to have a dose 2200 last night. She last had the prescription filled 2 days ago. Pt also complains of lower back pain Which she states is not new. Recent diagnosis of UTI. She states that she is on antibiotics. Pt denies other associated symptoms.   Past Medical History:  Diagnosis Date  . Acid reflux   . Breast cancer ([place])   . COPD (chronic obstructive pulmonary disease) ([place])   . High cholesterol   . Hypertension   . Hypothyroid   . Schizoaffective disorder     Patient Active Problem List   Diagnosis Date Noted  . Hyponatremia 01/31/2017  . Hypomagnesemia 01/31/2017  . Major depressive disorder, recurrent episode, moderate ([place]) 04/28/2016  . Chronic obstructive pulmonary disease ([place])   . Hypokalemia 03/07/2016  . Hypothyroidism 03/07/2016  . Schizoaffective disorder ([place]) 03/07/2016  . Essential hypertension 03/07/2016  . Tobacco abuse 03/07/2016    Past Surgical History:  Procedure Laterality Date  . APPENDECTOMY    . MASTECTOMY, PARTIAL    . TUBAL LIGATION      OB History    No data available       Home Medications    Prior to Admission medications   Medication Sig Start Date End Date Taking? Authorizing Provider    acetaminophen (TYLENOL) 500 MG tablet Take 1,000 mg by mouth every 6 (six) hours as needed for mild pain, moderate pain, fever or headache.    Historical Provider, MD  albuterol (PROVENTIL HFA;VENTOLIN HFA) 108 (90 Base) MCG/ACT inhaler Inhale 2 puffs into the lungs every 6 (six) hours as needed for wheezing or shortness of breath.    Historical Provider, MD  atorvastatin (LIPITOR) 20 MG tablet Take 1 tablet (20 mg total) by mouth at bedtime. 05/05/16   Benjamine Mola, FNP  cephALEXin (KEFLEX) 500 MG capsule Take 1 capsule (500 mg total) by mouth 2 (two) times daily. 02/04/17   Okey Regal, PA-C  diazepam (VALIUM) 5 MG tablet Take 1 tablet (5 mg total) by mouth at bedtime as needed for anxiety. 02/04/17   Okey Regal, PA-C  gabapentin (NEURONTIN) 300 MG capsule Take 300 mg by mouth 2 (two) times daily.     Historical Provider, MD  hydrOXYzine (VISTARIL) 25 MG capsule Take 1 capsule (25 mg total) by mouth 3 (three) times daily as needed for anxiety. 05/05/16   Benjamine Mola, FNP  levothyroxine (SYNTHROID, LEVOTHROID) 125 MCG tablet Take 125 mcg by mouth daily before breakfast. *Brand Name Only*    Historical Provider, MD  lisinopril (PRINIVIL,ZESTRIL) 10 MG tablet Take 10 mg by mouth daily.    Historical Provider, MD  magnesium oxide (MAG-OX) 400 MG tablet Take 2 tablets (800 mg total) by mouth 2 (  two) times daily. 02/03/17   Alfonzo Beers, MD  ondansetron (ZOFRAN ODT) 4 MG disintegrating tablet Take 1 tablet (4 mg total) by mouth every 8 (eight) hours as needed for nausea or vomiting. 02/05/17   Leo Grosser, MD  ondansetron (ZOFRAN) 4 MG tablet Take 1 tablet (4 mg total) by mouth every 6 (six) hours as needed for nausea. [id number]   Delora Fuel, MD  sertraline (ZOLOFT) 25 MG tablet Take 75 mg by mouth daily with breakfast.     Historical Provider, MD  trazodone (DESYREL) 300 MG tablet Take 300 mg by mouth at bedtime.    Historical Provider, MD    Family History Family History  Problem Relation Age of  Onset  . Heart attack Father   . Hypertension Father   . Cancer Other     Social History Social History  Substance Use Topics  . Smoking status: Current Every Day Smoker    Packs/day: 0.00    Years: 2.00    Types: Cigars  . Smokeless tobacco: Never Used  . Alcohol use No     Allergies   Budesonide and Promethazine   Review of Systems Review of Systems  Constitutional: Negative for fever.  Genitourinary: Negative for dysuria.  Musculoskeletal: Positive for back pain.  Psychiatric/Behavioral: The patient is nervous/anxious.   All other systems reviewed and are negative.    Physical Exam Updated Vital Signs BP 157/82 (BP Location: Right Arm)   Pulse 70   Temp 98.4 F (36.9 C) (Oral)   Resp 18   Ht 5\' 2"  (1.575 m)   Wt 133 lb (60.3 kg)   SpO2 96%   BMI 24.33 kg/m   Physical Exam  Constitutional: She is oriented to person, place, and time. No distress.  Chronically ill-appearing, no acute distress  HENT:  Head: Normocephalic and atraumatic.  Cardiovascular: Normal rate, regular rhythm and normal heart sounds.   Pulmonary/Chest: Effort normal. No respiratory distress.  Occasional wheeze  Neurological: She is alert and oriented to person, place, and time.  Skin: Skin is warm and dry.  Psychiatric: She has a normal mood and affect.  Flat affect  Nursing note and vitals reviewed.    ED Treatments / Results  DIAGNOSTIC STUDIES:  Oxygen Saturation is 96% on RA, normal by my interpretation.    COORDINATION OF CARE:  1:54 AM Discussed treatment plan with pt at bedside and pt agreed to plan.   Labs (all labs ordered are listed, but only abnormal results are displayed) Labs Reviewed - No data to display  EKG  EKG Interpretation None       Radiology No results found.  Procedures Procedures (including critical care time)  Medications Ordered in ED Medications  diazepam (VALIUM) tablet 5 mg (not administered)     Initial Impression /  Assessment and Plan / ED Course  I have reviewed the triage vital signs and the nursing notes.  Pertinent labs & imaging results that were available during my care of the patient were reviewed by me and considered in my medical decision making (see chart for details).    Patient presents requesting Valium refill. She states that she lost her medication. She otherwise is without complaint. She is currently being treated for UTI. Vital signs reassuring. I discussed with the patient that I could not write her prescription but would provide her with 5 mg of Ativan tonight. She needs follow-up with her primary provider for further prescriptions.  After history, exam, and medical workup I feel  the patient has been appropriately medically screened and is safe for discharge home. Pertinent diagnoses were discussed with the patient. Patient was given return precautions.   Final Clinical Impressions(s) / ED Diagnoses   Final diagnoses:  Encounter for medication refill    New Prescriptions New Prescriptions   No medications on file   I personally performed the services described in this documentation, which was scribed in my presence. The recorded information has been reviewed and is accurate.     Audrey Hacker, MD 02/06/17 [phone]

## 2017-02-06 NOTE — ED Triage Notes (Signed)
Per [facility] patient from home for nausea and diarrhea that has been ongoing. Patient left here after being seen earlier this morning and still having nausea.

## 2017-02-06 NOTE — ED Notes (Signed)
Called pt for room placement x3 no response.

## 2017-02-06 NOTE — ED Notes (Signed)
Called pt for room placement x2 no response.

## 2017-02-07 ENCOUNTER — Emergency Department (HOSPITAL_COMMUNITY)
Admission: EM | Admit: 2017-02-07 | Discharge: 2017-02-07 | Disposition: A | Discharge status: Other Acute Inpatient Hospital | Payer: Medicare Other | Attending: Emergency Medicine | Admitting: Emergency Medicine

## 2017-02-07 ENCOUNTER — Encounter (HOSPITAL_COMMUNITY): Payer: Self-pay | Admitting: *Deleted

## 2017-02-07 DIAGNOSIS — E039 Hypothyroidism, unspecified: Secondary | ICD-10-CM | POA: Insufficient documentation

## 2017-02-07 DIAGNOSIS — Z853 Personal history of malignant neoplasm of breast: Secondary | ICD-10-CM | POA: Diagnosis not present

## 2017-02-07 DIAGNOSIS — F17290 Nicotine dependence, other tobacco product, uncomplicated: Secondary | ICD-10-CM | POA: Insufficient documentation

## 2017-02-07 DIAGNOSIS — I10 Essential (primary) hypertension: Secondary | ICD-10-CM | POA: Diagnosis not present

## 2017-02-07 DIAGNOSIS — R45851 Suicidal ideations: Secondary | ICD-10-CM | POA: Diagnosis present

## 2017-02-07 DIAGNOSIS — F251 Schizoaffective disorder, depressive type: Secondary | ICD-10-CM | POA: Diagnosis present

## 2017-02-07 DIAGNOSIS — F331 Major depressive disorder, recurrent, moderate: Secondary | ICD-10-CM | POA: Diagnosis not present

## 2017-02-07 DIAGNOSIS — Z79899 Other long term (current) drug therapy: Secondary | ICD-10-CM | POA: Diagnosis not present

## 2017-02-07 DIAGNOSIS — J449 Chronic obstructive pulmonary disease, unspecified: Secondary | ICD-10-CM | POA: Insufficient documentation

## 2017-02-07 LAB — URINALYSIS, ROUTINE W REFLEX MICROSCOPIC
Bacteria, UA: NONE SEEN
Bilirubin Urine: NEGATIVE
GLUCOSE, UA: NEGATIVE mg/dL
Ketones, ur: 5 mg/dL — AB
Leukocytes, UA: NEGATIVE
Nitrite: NEGATIVE
PH: 7 (ref 5.0–8.0)
Protein, ur: NEGATIVE mg/dL
SPECIFIC GRAVITY, URINE: 1.003 — AB (ref 1.005–1.030)

## 2017-02-07 LAB — ETHANOL

## 2017-02-07 LAB — BASIC METABOLIC PANEL
ANION GAP: 9 (ref 5–15)
BUN: 5 mg/dL — ABNORMAL LOW (ref 6–20)
CALCIUM: 8.6 mg/dL — AB (ref 8.9–10.3)
CO2: 26 mmol/L (ref 22–32)
Chloride: 88 mmol/L — ABNORMAL LOW (ref 101–111)
Creatinine, Ser: 0.6 mg/dL (ref 0.44–1.00)
Glucose, Bld: 101 mg/dL — ABNORMAL HIGH (ref 65–99)
Potassium: 3.7 mmol/L (ref 3.5–5.1)
Sodium: 123 mmol/L — ABNORMAL LOW (ref 135–145)

## 2017-02-07 LAB — CBC
HCT: 32.9 % — ABNORMAL LOW (ref 36.0–46.0)
Hemoglobin: 12 g/dL (ref 12.0–15.0)
MCH: 30 pg (ref 26.0–34.0)
MCHC: 36.5 g/dL — ABNORMAL HIGH (ref 30.0–36.0)
MCV: 82.3 fL (ref 78.0–100.0)
Platelets: 240 10*3/uL (ref 150–400)
RBC: 4 MIL/uL (ref 3.87–5.11)
RDW: 12.2 % (ref 11.5–15.5)
WBC: 7.7 10*3/uL (ref 4.0–10.5)

## 2017-02-07 LAB — COMPREHENSIVE METABOLIC PANEL
ALT: 17 U/L (ref 14–54)
AST: 28 U/L (ref 15–41)
Albumin: 4.3 g/dL (ref 3.5–5.0)
Alkaline Phosphatase: 55 U/L (ref 38–126)
Anion gap: 10 (ref 5–15)
BILIRUBIN TOTAL: 0.9 mg/dL (ref 0.3–1.2)
BUN: 6 mg/dL (ref 6–20)
CHLORIDE: 86 mmol/L — AB (ref 101–111)
CO2: 25 mmol/L (ref 22–32)
CREATININE: 0.65 mg/dL (ref 0.44–1.00)
Calcium: 8.7 mg/dL — ABNORMAL LOW (ref 8.9–10.3)
GFR calc non Af Amer: >60 mL/min (ref >60)
Glucose, Bld: 83 mg/dL (ref 65–99)
POTASSIUM: 3.1 mmol/L — AB (ref 3.5–5.1)
Sodium: 121 mmol/L — ABNORMAL LOW (ref 135–145)
TOTAL PROTEIN: 6.8 g/dL (ref 6.5–8.1)

## 2017-02-07 LAB — RAPID URINE DRUG SCREEN, HOSP PERFORMED
AMPHETAMINES: NOT DETECTED
BARBITURATES: NOT DETECTED
BENZODIAZEPINES: POSITIVE — AB
Cocaine: NOT DETECTED
Opiates: NOT DETECTED
TETRAHYDROCANNABINOL: NOT DETECTED

## 2017-02-07 LAB — ACETAMINOPHEN LEVEL: Acetaminophen (Tylenol), Serum: 15 ug/mL (ref 10–30)

## 2017-02-07 LAB — SALICYLATE LEVEL

## 2017-02-07 MED ORDER — NICOTINE 21 MG/24HR TD PT24
21.0000 mg | MEDICATED_PATCH | Freq: Every day | TRANSDERMAL | Status: DC
  Administered 2017-02-07: 21 mg via TRANSDERMAL
  Filled 2017-02-07: qty 1

## 2017-02-07 MED ORDER — HYDROXYZINE PAMOATE 25 MG PO CAPS
25.0000 mg | ORAL_CAPSULE | Freq: Three times a day (TID) | ORAL | Status: DC | PRN
  Filled 2017-02-07: qty 1

## 2017-02-07 MED ORDER — LEVOTHYROXINE SODIUM 125 MCG PO TABS: 125.0000 ug | ORAL_TABLET | Freq: Every day | ORAL | Status: DC

## 2017-02-07 MED ORDER — TRAZODONE HCL 100 MG PO TABS: 300.0000 mg | ORAL_TABLET | Freq: Every day | ORAL | Status: DC

## 2017-02-07 MED ORDER — ZOLPIDEM TARTRATE 5 MG PO TABS: 5.0000 mg | ORAL_TABLET | Freq: Every evening | ORAL | Status: DC | PRN

## 2017-02-07 MED ORDER — TRAZODONE HCL 100 MG PO TABS: 200.0000 mg | ORAL_TABLET | Freq: Every day | ORAL | Status: DC

## 2017-02-07 MED ORDER — ACETAMINOPHEN 325 MG PO TABS
650.0000 mg | ORAL_TABLET | ORAL | Status: DC | PRN
  Administered 2017-02-07: 650 mg via ORAL
  Filled 2017-02-07: qty 2

## 2017-02-07 MED ORDER — DIPHENHYDRAMINE HCL 25 MG PO CAPS
50.0000 mg | ORAL_CAPSULE | Freq: Once | ORAL | Status: AC
  Administered 2017-02-07: 50 mg via ORAL
  Filled 2017-02-07: qty 2

## 2017-02-07 MED ORDER — ALBUTEROL SULFATE HFA 108 (90 BASE) MCG/ACT IN AERS: 2.0000 | INHALATION_SPRAY | Freq: Four times a day (QID) | RESPIRATORY_TRACT | Status: DC | PRN

## 2017-02-07 MED ORDER — ATORVASTATIN CALCIUM 20 MG PO TABS
20.0000 mg | ORAL_TABLET | Freq: Every day | ORAL | Status: DC
  Filled 2017-02-07: qty 1

## 2017-02-07 MED ORDER — ALUM & MAG HYDROXIDE-SIMETH 200-200-20 MG/5ML PO SUSP: 30.0000 mL | ORAL | Status: DC | PRN

## 2017-02-07 MED ORDER — HYDROXYZINE HCL 25 MG PO TABS
25.0000 mg | ORAL_TABLET | Freq: Three times a day (TID) | ORAL | Status: DC | PRN
  Administered 2017-02-07: 25 mg via ORAL
  Filled 2017-02-07: qty 1

## 2017-02-07 MED ORDER — IBUPROFEN 200 MG PO TABS
600.0000 mg | ORAL_TABLET | Freq: Three times a day (TID) | ORAL | Status: DC | PRN
  Administered 2017-02-07: 600 mg via ORAL
  Filled 2017-02-07: qty 3

## 2017-02-07 MED ORDER — HYDROXYZINE HCL 25 MG PO TABS
25.0000 mg | ORAL_TABLET | Freq: Once | ORAL | Status: AC
  Administered 2017-02-07: 25 mg via ORAL
  Filled 2017-02-07: qty 1

## 2017-02-07 MED ORDER — ONDANSETRON HCL 4 MG PO TABS: 4.0000 mg | ORAL_TABLET | Freq: Four times a day (QID) | ORAL | Status: DC | PRN

## 2017-02-07 MED ORDER — POTASSIUM CHLORIDE CRYS ER 20 MEQ PO TBCR
40.0000 meq | EXTENDED_RELEASE_TABLET | Freq: Once | ORAL | Status: AC
  Administered 2017-02-07: 40 meq via ORAL
  Filled 2017-02-07: qty 2

## 2017-02-07 MED ORDER — LISINOPRIL 10 MG PO TABS
10.0000 mg | ORAL_TABLET | Freq: Every day | ORAL | Status: DC
  Administered 2017-02-07: 10 mg via ORAL
  Filled 2017-02-07: qty 1

## 2017-02-07 MED ORDER — MAGNESIUM OXIDE 400 (241.3 MG) MG PO TABS
800.0000 mg | ORAL_TABLET | Freq: Two times a day (BID) | ORAL | Status: DC
  Administered 2017-02-07: 800 mg via ORAL
  Filled 2017-02-07 (×3): qty 2

## 2017-02-07 MED ORDER — SERTRALINE HCL 50 MG PO TABS: 75.0000 mg | ORAL_TABLET | Freq: Every day | ORAL | Status: DC

## 2017-02-07 MED ORDER — ONDANSETRON HCL 4 MG PO TABS
4.0000 mg | ORAL_TABLET | Freq: Three times a day (TID) | ORAL | Status: DC | PRN
  Administered 2017-02-07: 4 mg via ORAL
  Filled 2017-02-07: qty 1

## 2017-02-07 MED ORDER — SERTRALINE HCL 50 MG PO TABS: 100.0000 mg | ORAL_TABLET | Freq: Every day | ORAL | Status: DC

## 2017-02-07 MED ORDER — GABAPENTIN 300 MG PO CAPS
300.0000 mg | ORAL_CAPSULE | Freq: Two times a day (BID) | ORAL | Status: DC
  Administered 2017-02-07: 300 mg via ORAL
  Filled 2017-02-07: qty 1

## 2017-02-07 NOTE — ED Provider Notes (Signed)
[facility] DEPT Provider Note   CSN: [id number] Arrival date & time: 02/07/17  0145     History   Chief Complaint Chief Complaint  Patient presents with  . Suicidal    HPI Audrey Meyer is a 61 y.o. female with past medical history of schizoaffective disorder and previous history of breast cancer. The patient presents emergency Department and states that she is planning to overdose on her medications at home because "no one cares." Patient states that she has been unable to obtain her diazepam because she was being weaned off of it so that she could take opioid pain medicines. She didn't contact her doctor today to make an appointment has been out of her Valium for several months. Patient states that she is depressed and she text that her children who told her it would be okay and then did not return further texts. Patient states "no one cares about me."  HPI  Past Medical History:  Diagnosis Date  . Acid reflux   . Breast cancer ([place])   . COPD (chronic obstructive pulmonary disease) ([place])   . High cholesterol   . Hypertension   . Hypothyroid   . Schizoaffective disorder     Patient Active Problem List   Diagnosis Date Noted  . Hyponatremia 01/31/2017  . Hypomagnesemia 01/31/2017  . Major depressive disorder, recurrent episode, moderate ([place]) 04/28/2016  . Chronic obstructive pulmonary disease ([place])   . Hypokalemia 03/07/2016  . Hypothyroidism 03/07/2016  . Schizoaffective disorder ([place]) 03/07/2016  . Essential hypertension 03/07/2016  . Tobacco abuse 03/07/2016    Past Surgical History:  Procedure Laterality Date  . APPENDECTOMY    . MASTECTOMY, PARTIAL    . TUBAL LIGATION      OB History    No data available       Home Medications    Prior to Admission medications   Medication Sig Start Date End Date Taking? Authorizing Provider  acetaminophen (TYLENOL) 500 MG tablet Take 1,000 mg by mouth every 6 (six) hours as needed for mild pain, moderate pain,  fever or headache.   Yes Historical Provider, MD  albuterol (PROVENTIL HFA;VENTOLIN HFA) 108 (90 Base) MCG/ACT inhaler Inhale 2 puffs into the lungs every 6 (six) hours as needed for wheezing or shortness of breath.   Yes Historical Provider, MD  atorvastatin (LIPITOR) 20 MG tablet Take 1 tablet (20 mg total) by mouth at bedtime. 05/05/16  Yes Benjamine Mola, FNP  cephALEXin (KEFLEX) 500 MG capsule Take 1 capsule (500 mg total) by mouth 2 (two) times daily. 02/04/17  Yes Jeffrey Hedges, PA-C  diazepam (VALIUM) 5 MG tablet Take 1 tablet (5 mg total) by mouth at bedtime as needed for anxiety. 02/04/17  Yes Jeffrey Hedges, PA-C  gabapentin (NEURONTIN) 300 MG capsule Take 300 mg by mouth 2 (two) times daily.    Yes Historical Provider, MD  hydrOXYzine (VISTARIL) 25 MG capsule Take 1 capsule (25 mg total) by mouth 3 (three) times daily as needed for anxiety. 05/05/16  Yes Benjamine Mola, FNP  levothyroxine (SYNTHROID, LEVOTHROID) 125 MCG tablet Take 125 mcg by mouth daily before breakfast. *Brand Name Only*   Yes Historical Provider, MD  lisinopril (PRINIVIL,ZESTRIL) 10 MG tablet Take 10 mg by mouth daily.   Yes Historical Provider, MD  magnesium oxide (MAG-OX) 400 MG tablet Take 2 tablets (800 mg total) by mouth 2 (two) times daily. 02/03/17  Yes Alfonzo Beers, MD  ondansetron (ZOFRAN ODT) 4 MG disintegrating tablet Take 1 tablet (  4 mg total) by mouth every 8 (eight) hours as needed for nausea or vomiting. 02/05/17  Yes Leo Grosser, MD  sertraline (ZOLOFT) 25 MG tablet Take 75 mg by mouth daily with breakfast.    Yes Historical Provider, MD  trazodone (DESYREL) 300 MG tablet Take 300 mg by mouth at bedtime.   Yes Historical Provider, MD  ondansetron (ZOFRAN) 4 MG tablet Take 1 tablet (4 mg total) by mouth every 6 (six) hours as needed for nausea. Patient not taking: Reported on 02/07/2017 [id number]   Delora Fuel, MD    Family History Family History  Problem Relation Age of Onset  . Heart attack Father   .  Hypertension Father   . Cancer Other     Social History Social History  Substance Use Topics  . Smoking status: Current Every Day Smoker    Packs/day: 0.00    Years: 2.00    Types: Cigars  . Smokeless tobacco: Never Used  . Alcohol use No     Allergies   Budesonide and Promethazine   Review of Systems Review of Systems Ten systems reviewed and are negative for acute change, except as noted in the HPI.    Physical Exam Updated Vital Signs BP 169/80 (BP Location: Left Arm)   Pulse 65   Temp 97.5 F (36.4 C) (Oral)   Resp 16   Ht 5\' 2"  (1.575 m)   Wt 60.3 kg   SpO2 94%   BMI 24.33 kg/m   Physical Exam  Constitutional: She is oriented to person, place, and time. She appears well-developed and well-nourished. No distress.  HENT:  Head: Normocephalic and atraumatic.  Eyes: Conjunctivae are normal. No scleral icterus.  Neck: Normal range of motion.  Cardiovascular: Normal rate, regular rhythm and normal heart sounds.  Exam reveals no gallop and no friction rub.   No murmur heard. Pulmonary/Chest: Effort normal and breath sounds normal. No respiratory distress.  Abdominal: Soft. Bowel sounds are normal. She exhibits no distension and no mass. There is no tenderness. There is no guarding.  Neurological: She is alert and oriented to person, place, and time.  Skin: Skin is warm and dry. She is not diaphoretic.  Psychiatric: Her behavior is normal.  Nursing note and vitals reviewed.    ED Treatments / Results  Labs (all labs ordered are listed, but only abnormal results are displayed) Labs Reviewed  COMPREHENSIVE METABOLIC PANEL  ETHANOL  SALICYLATE LEVEL  ACETAMINOPHEN LEVEL  CBC  RAPID URINE DRUG SCREEN, HOSP PERFORMED    EKG  EKG Interpretation None       Radiology No results found.  Procedures Procedures (including critical care time)  Medications Ordered in ED Medications  alum & mag hydroxide-simeth (MAALOX/MYLANTA) 200-200-20 MG/5ML  suspension 30 mL (not administered)  ondansetron (ZOFRAN) tablet 4 mg (not administered)  nicotine (NICODERM CQ - dosed in mg/24 hours) patch 21 mg (not administered)  zolpidem (AMBIEN) tablet 5 mg (not administered)  ibuprofen (ADVIL,MOTRIN) tablet 600 mg (not administered)  acetaminophen (TYLENOL) tablet 650 mg (not administered)     Initial Impression / Assessment and Plan / ED Course  I have reviewed the triage vital signs and the nursing notes.  Pertinent labs & imaging results that were available during my care of the patient were reviewed by me and considered in my medical decision making (see chart for details).    Patient is medically clear for psych evaluation. Here voluntarily.  Final Clinical Impressions(s) / ED Diagnoses   Final diagnoses:  Suicidal ideation    New Prescriptions New Prescriptions   No medications on file     Margarita Mail, PA-C 02/07/17 [place], MD 02/07/17 [phone]

## 2017-02-07 NOTE — ED Notes (Signed)
Pt up at nurses station yelling screaming,security present. Pt ambulatory back to room with redirection.

## 2017-02-07 NOTE — ED Triage Notes (Signed)
Pt brought in by [employer].  Pt stated "I'm suicidal.  I'm going to take my pills.  I got a valium last night.  I ran out of of my valium at home but I found 1 and took it tonight.  No one care about me.  I have a daughter but she's got a drug problem.  I have a son and I texted him.  He told me it was going to be alright.  I sent him more texts but he didn't answer.  I also talked to my aunt and texted her.  You can put me in a mental institution or a nursing home."

## 2017-02-07 NOTE — ED Notes (Signed)
Pt requesting to speak to Education officer, museum, this nurse left message with Education officer, museum.

## 2017-02-07 NOTE — ED Notes (Signed)
Patient escorted to shower for privacy to perform safety check for contraband and none found. Patient educated about search process and term "contraband " and routine search performed. No contraband found. 

## 2017-02-07 NOTE — ED Notes (Signed)
Patient is currently denying SI,HI and AVH to this Probation officer. Patient oriented to unit. Plan of care discussed. Patient voices no complaints or concerns at this time. Encouragement and support provided and safety maintain. Q 15 min safety checks in place and video monitoring.

## 2017-02-07 NOTE — BH Assessment (Signed)
Tele Assessment Note   Audrey Meyer is an 61 y.o. female who presents to the ED voluntarily. Pt reports she is feeling suicidal with a plan to OD on her medication. Pt vague during the assessment and only responding to some questions. Pt not making eye contact and continued to say "no one cares about me" during the assessment.   Pt reports a hx of inpt treatment, most recent incident in 2017 due to MDD. Pt is poor historian and often says "I don't know" when she is asked questions about her recent stressors, current medications, and depressive symptoms including sleeping habits. Pt stated she has experienced insomnia "for a long time." Pt often paused for several seconds after being asked a direct question and would eventually respond with "I don't know."  Per Patriciaann Clan, PA pt will need an AM psych eval. Margaretha Sheffield, RN notified of the recommendation.   Diagnosis: Major Depressive D/O, single episode; hx of Schizoaffective disorder  Past Medical History:  Past Medical History:  Diagnosis Date   Acid reflux    Breast cancer (H[facility])    COPD (chronic obstructive pulmonary disease) (H[facility])    High cholesterol    Hypertension    Hypothyroid    Schizoaffective disorder     Past Surgical History:  Procedure Laterality Date   APPENDECTOMY     MASTECTOMY, PARTIAL     TUBAL LIGATION      Family History:  Family History  Problem Relation Age of Onset   Heart attack Father    Hypertension Father    Cancer Other     Social History:  reports that she has been smoking Cigars.  She has been smoking about 0.00 packs per day for the past 2.00 years. She has never used smokeless tobacco. She reports that she does not drink alcohol or use drugs.  Additional Social History:  Alcohol / Drug Use Pain Medications: See PTA meds  Prescriptions: See PTA meds  Over the Counter: See PTA meds  History of alcohol / drug use?: No history of alcohol / drug abuse  CIWA: CIWA-Ar BP:  169/80 Pulse Rate: 65 COWS:    PATIENT STRENGTHS: (choose at least two) Capable of independent living [place] of knowledge Motivation for treatment/growth  Allergies:  Allergies  Allergen Reactions   Budesonide Shortness Of Breath   Promethazine Nausea Only    Home Medications:  (Not in a hospital admission)  OB/GYN Status:  No LMP recorded. Patient is postmenopausal.  General Assessment Data Location of Assessment: [facility] TTS Assessment: In system Is this a Tele or Face-to-Face Assessment?: Tele Assessment Is this an Initial Assessment or a Re-assessment for this encounter?: Initial Assessment Marital status: Single Is patient pregnant?: No Pregnancy Status: No Living Arrangements: Alone Can pt return to current living arrangement?: Yes Admission Status: Voluntary Is patient capable of signing voluntary admission?: Yes Referral Source: Self/Family/Friend Insurance type: Medicare     Crisis Care Plan Living Arrangements: Alone Name of Psychiatrist: [facility] Name of Therapist: [facility]  Education Status Is patient currently in school?: No Highest grade of school patient has completed: GED  Risk to self with the past 6 months Suicidal Ideation: Yes-Currently Present Has patient been a risk to self within the past 6 months prior to admission? : Yes Suicidal Intent: Yes-Currently Present Has patient had any suicidal intent within the past 6 months prior to admission? : Yes Is patient at risk for suicide?: Yes Suicidal Plan?: Yes-Currently Present Has patient had any  suicidal plan within the past 6 months prior to admission? : Yes Specify Current Suicidal Plan: pt reports a plan to OD on medication Access to Means: Yes Specify Access to Suicidal Means: pt reports she has access to medication  What has been your use of drugs/alcohol within the last 12 months?: denies Previous Attempts/Gestures: Yes How many times?: 2 Triggers for Past  Attempts: Unpredictable Intentional Self Injurious Behavior: None Family Suicide History: No Recent stressful life event(s): Loss (Comment) (pt states she feels alone, no one cares ) Persecutory voices/beliefs?: No Depression: Yes Depression Symptoms: Despondent, Insomnia, Tearfulness, Feeling worthless/self pity Substance abuse history and/or treatment for substance abuse?: No Suicide prevention information given to non-admitted patients: Not applicable  Risk to Others within the past 6 months Homicidal Ideation: No Does patient have any lifetime risk of violence toward others beyond the six months prior to admission? : No Thoughts of Harm to Others: No Current Homicidal Intent: No Current Homicidal Plan: No Access to Homicidal Means: No History of harm to others?: No Assessment of Violence: None Noted Does patient have access to weapons?: No Criminal Charges Pending?: No Does patient have a court date: No Is patient on probation?: No  Psychosis Hallucinations: None noted Delusions: None noted  Mental Status Report Appearance/Hygiene: Disheveled Eye Contact: Poor Motor Activity: Unsteady Speech: Soft, Slurred Level of Consciousness: Quiet/awake Mood: Depressed, Sullen Affect: Depressed, Flat Anxiety Level: None Thought Processes: Coherent, Relevant Judgement: Partial Orientation: Person, Place, Time, Situation, Appropriate for developmental age Obsessive Compulsive Thoughts/Behaviors: None  Cognitive Functioning Concentration: Fair Memory: Remote Impaired, Recent Impaired IQ: Average Insight: Fair Impulse Control: Fair Appetite: Poor Sleep: Decreased Total Hours of Sleep:  (pt stated "I don't know") Vegetative Symptoms: None  ADLScreening [facility] Assessment Services) Patient's cognitive ability adequate to safely complete daily activities?: Yes Patient able to express need for assistance with ADLs?: Yes Independently performs ADLs?: Yes (appropriate for  developmental age)  Prior Inpatient Therapy Prior Inpatient Therapy: Yes Prior Therapy Dates: 2001, 2017 Prior Therapy Facilty/Provider(s): [facility], [facility] Reason for Treatment: anxiety, Schizoaffective disorder  Prior Outpatient Therapy Prior Outpatient Therapy: Yes Prior Therapy Dates: current  [facility]Provider(s[facility] Reason for Treatment: anxiety per chart  Does patient have an ACCT team?: Unknown Does patient have Intensive In-House Services?  : Unknown Does patient have [facility] services? : Yes Does patient have P4CC services?: Unknown  ADL Screening (condition at time of admission) Patient's cognitive ability adequate to safely complete daily activities?: Yes Is the patient deaf or have difficulty hearing?: No Does the patient have difficulty seeing, even when wearing glasses/contacts?: No Does the patient have difficulty concentrating, remembering, or making decisions?: Yes Patient able to express need for assistance with ADLs?: Yes Does the patient have difficulty dressing or bathing?: No Independently performs ADLs?: Yes (appropriate for developmental age) Does the patient have difficulty walking or climbing stairs?: Yes Weakness of Legs: Both Weakness of Arms/Hands: None  Home Assistive Devices/Equipment Home Assistive Devices/Equipment: Eyeglasses    Abuse/Neglect Assessment (Assessment to be complete while patient is alone) Physical Abuse: Denies Verbal Abuse: Yes, past (Comment) (childhood and adulthood) Sexual Abuse: Denies Exploitation of patient/patient's resources: Denies Self-Neglect: Denies     Regulatory affairs officer (For Healthcare) Does Patient Have a Medical Advance Directive?: No Would patient like information on creating a medical advance directive?: No - Patient declined    Additional Information 1:1 In Past 12 Months?: No CIRT Risk: No Elopement Risk: No Does patient have medical clearance?: Yes     Disposition:  Disposition Initial Assessment Completed for this Encounter: Yes Disposition of Patient: Other dispositions Other disposition(s): Other (Comment) (AM psych eval per Patriciaann Clan, [place] )  Lyanne Co 02/07/2017 3:40 AM

## 2017-02-07 NOTE — ED Provider Notes (Signed)
3:17 PM Nursing called report that patient has been accepted at [facility] for inpatient management of her suicidal ideation. Patient is a voluntary admission.   Patient was assessed by me and had no other complaints. Patient's lungs are clear. Patient resting calmly.    [facility] documentation will be filled out for patient to be transferred. Accepting physician is Dr. Yehuda Savannah.       Gwenyth Allegra Tegeler, MD 02/07/17 [phone]

## 2017-02-07 NOTE — Progress Notes (Signed)
02/07/17 1400:  LRT introduced self to pt and offered activities pt declined.  LRT will check back with pt.  Victorino Sparrow, LRT/CTRS   1440:  LRT and nurse went back to offer pt activities.  Pt was very anxious, tearful and declined any activities.   Victorino Sparrow, LRT/CTRS

## 2017-02-07 NOTE — ED Notes (Signed)
[facility] called for transfer.  

## 2017-02-07 NOTE — ED Notes (Signed)
Patient complains of nausea at this time. Patient medicated per MAR. Encouragement and support provided and safety maintain. Q 15 min safety check in place and video monitoring.

## 2017-02-07 NOTE — ED Notes (Signed)
Pt talking on hallway phone.  

## 2017-02-07 NOTE — ED Notes (Signed)
Jameson,NP notified of pt bp, no new orders at this time, will continue to monitor.

## 2017-02-07 NOTE — ED Notes (Signed)
Pt in room yelling, screaming, demanding attention from all staff and hostile if not instant gratification. This nurse and Nena Jordan, RN in pt room.

## 2017-02-07 NOTE — ED Notes (Signed)
[facility] transport on unit to transfer pt to [facility] per MD order. Personal property given to [facility] transport for transfer. Pt signed e-signature. Ambulatory off unit. [facility] notified that pt is in route.

## 2017-02-07 NOTE — BH Assessment (Addendum)
[facility] Assessment Progress Note  Per Corena Pilgrim, MD, this pt requires psychiatric hospitalization at this time.  At 14:55 Audrey Meyer calls from [facility].  Pt has been accepted to their facility by Flora Lipps, NP to the service of Dr Launa Grill, [place] [address].  EDP Dr Sherry Ruffing, concurs with this disposition, as does the pt who is currently under voluntary status.  Pt's nurse, Caryl Pina has been notified, and agrees to call report to [phone].  Pt is to be transported via Stacey Drain, MA Triage Specialist [phone]  Addendum:  Pt asks this writer to call her ex-husband, [name], and she has signed Consent to Release Information, which in now in pt's chart.  At 15:20 I called him at [phone], speaking to him in person and providing contact information for [facility].  He requests a call when pt is being prepared for transfer.  This has been passed on to pt's nurse.  Jalene Mullet, [place] Triage Specialist [phone]

## 2017-02-07 NOTE — ED Notes (Signed)
[facility] called for [facility] lab drawl.

## 2017-02-23 ENCOUNTER — Encounter (HOSPITAL_COMMUNITY): Payer: Self-pay

## 2017-02-23 ENCOUNTER — Emergency Department (HOSPITAL_COMMUNITY)
Admission: EM | Admit: 2017-02-23 | Discharge: 2017-02-24 | Disposition: A | Discharge status: Home / Self Care | Payer: Medicare Other | Attending: Emergency Medicine | Admitting: Emergency Medicine

## 2017-02-23 DIAGNOSIS — J449 Chronic obstructive pulmonary disease, unspecified: Secondary | ICD-10-CM | POA: Diagnosis not present

## 2017-02-23 DIAGNOSIS — Z79899 Other long term (current) drug therapy: Secondary | ICD-10-CM | POA: Diagnosis not present

## 2017-02-23 DIAGNOSIS — Y999 Unspecified external cause status: Secondary | ICD-10-CM | POA: Diagnosis not present

## 2017-02-23 DIAGNOSIS — F251 Schizoaffective disorder, depressive type: Secondary | ICD-10-CM | POA: Diagnosis not present

## 2017-02-23 DIAGNOSIS — M545 Low back pain, unspecified: Secondary | ICD-10-CM

## 2017-02-23 DIAGNOSIS — X58XXXA Exposure to other specified factors, initial encounter: Secondary | ICD-10-CM | POA: Diagnosis not present

## 2017-02-23 DIAGNOSIS — E039 Hypothyroidism, unspecified: Secondary | ICD-10-CM | POA: Insufficient documentation

## 2017-02-23 DIAGNOSIS — Y929 Unspecified place or not applicable: Secondary | ICD-10-CM | POA: Diagnosis not present

## 2017-02-23 DIAGNOSIS — Z853 Personal history of malignant neoplasm of breast: Secondary | ICD-10-CM | POA: Insufficient documentation

## 2017-02-23 DIAGNOSIS — R45851 Suicidal ideations: Secondary | ICD-10-CM | POA: Diagnosis not present

## 2017-02-23 DIAGNOSIS — I10 Essential (primary) hypertension: Secondary | ICD-10-CM | POA: Diagnosis not present

## 2017-02-23 DIAGNOSIS — S39012A Strain of muscle, fascia and tendon of lower back, initial encounter: Secondary | ICD-10-CM | POA: Insufficient documentation

## 2017-02-23 DIAGNOSIS — F17290 Nicotine dependence, other tobacco product, uncomplicated: Secondary | ICD-10-CM | POA: Diagnosis not present

## 2017-02-23 DIAGNOSIS — Y939 Activity, unspecified: Secondary | ICD-10-CM | POA: Diagnosis not present

## 2017-02-23 DIAGNOSIS — F17210 Nicotine dependence, cigarettes, uncomplicated: Secondary | ICD-10-CM | POA: Diagnosis not present

## 2017-02-23 DIAGNOSIS — F410 Panic disorder [episodic paroxysmal anxiety] without agoraphobia: Secondary | ICD-10-CM | POA: Diagnosis not present

## 2017-02-23 DIAGNOSIS — S3992XA Unspecified injury of lower back, initial encounter: Secondary | ICD-10-CM | POA: Diagnosis present

## 2017-02-23 LAB — CBC
HEMATOCRIT: 36.2 % (ref 36.0–46.0)
Hemoglobin: 12.2 g/dL (ref 12.0–15.0)
MCH: 30.3 pg (ref 26.0–34.0)
MCHC: 33.7 g/dL (ref 30.0–36.0)
MCV: 89.8 fL (ref 78.0–100.0)
PLATELETS: 331 10*3/uL (ref 150–400)
RBC: 4.03 MIL/uL (ref 3.87–5.11)
RDW: 13.6 % (ref 11.5–15.5)
WBC: 8 10*3/uL (ref 4.0–10.5)

## 2017-02-23 LAB — RAPID URINE DRUG SCREEN, HOSP PERFORMED
Amphetamines: NOT DETECTED
Barbiturates: NOT DETECTED
Benzodiazepines: NOT DETECTED
Cocaine: NOT DETECTED
Opiates: NOT DETECTED
Tetrahydrocannabinol: NOT DETECTED

## 2017-02-23 LAB — COMPREHENSIVE METABOLIC PANEL
ALBUMIN: 4.3 g/dL (ref 3.5–5.0)
ALK PHOS: 84 U/L (ref 38–126)
ALT: 57 U/L — ABNORMAL HIGH (ref 14–54)
AST: 45 U/L — AB (ref 15–41)
Anion gap: 9 (ref 5–15)
BILIRUBIN TOTAL: 0.7 mg/dL (ref 0.3–1.2)
BUN: 8 mg/dL (ref 6–20)
CO2: 25 mmol/L (ref 22–32)
Calcium: 9.7 mg/dL (ref 8.9–10.3)
Chloride: 103 mmol/L (ref 101–111)
Creatinine, Ser: 0.72 mg/dL (ref 0.44–1.00)
GFR calc Af Amer: >60 mL/min (ref >60)
GLUCOSE: 107 mg/dL — AB (ref 65–99)
POTASSIUM: 3.9 mmol/L (ref 3.5–5.1)
Sodium: 137 mmol/L (ref 135–145)
TOTAL PROTEIN: 7.1 g/dL (ref 6.5–8.1)

## 2017-02-23 LAB — SALICYLATE LEVEL: Salicylate Lvl: <7 mg/dL (ref 2.8–30.0)

## 2017-02-23 LAB — ETHANOL

## 2017-02-23 LAB — ACETAMINOPHEN LEVEL: Acetaminophen (Tylenol), Serum: 14 ug/mL (ref 10–30)

## 2017-02-23 MED ORDER — LISINOPRIL 10 MG PO TABS
10.0000 mg | ORAL_TABLET | Freq: Every day | ORAL | Status: DC
  Administered 2017-02-23 – 2017-02-24 (×2): 10 mg via ORAL
  Filled 2017-02-23 (×2): qty 1

## 2017-02-23 MED ORDER — SERTRALINE HCL 50 MG PO TABS
75.0000 mg | ORAL_TABLET | Freq: Every day | ORAL | Status: DC
  Administered 2017-02-24: 75 mg via ORAL
  Filled 2017-02-23: qty 2

## 2017-02-23 MED ORDER — ACETAMINOPHEN 325 MG PO TABS
650.0000 mg | ORAL_TABLET | Freq: Once | ORAL | Status: AC
  Administered 2017-02-23: 650 mg via ORAL

## 2017-02-23 MED ORDER — THIOTHIXENE 2 MG PO CAPS
2.0000 mg | ORAL_CAPSULE | Freq: Three times a day (TID) | ORAL | Status: DC
  Administered 2017-02-23 – 2017-02-24 (×2): 2 mg via ORAL
  Filled 2017-02-23 (×3): qty 1

## 2017-02-23 MED ORDER — ACETAMINOPHEN 325 MG PO TABS
ORAL_TABLET | ORAL | Status: AC
  Filled 2017-02-23: qty 2

## 2017-02-23 MED ORDER — ATORVASTATIN CALCIUM 10 MG PO TABS
20.0000 mg | ORAL_TABLET | Freq: Every day | ORAL | Status: DC
  Administered 2017-02-23: 20 mg via ORAL
  Filled 2017-02-23: qty 2

## 2017-02-23 MED ORDER — DIAZEPAM 5 MG PO TABS
5.0000 mg | ORAL_TABLET | Freq: Once | ORAL | Status: AC
  Administered 2017-02-23: 5 mg via ORAL
  Filled 2017-02-23: qty 1

## 2017-02-23 MED ORDER — FUROSEMIDE 20 MG PO TABS
10.0000 mg | ORAL_TABLET | Freq: Every day | ORAL | Status: DC
  Administered 2017-02-24: 10 mg via ORAL
  Filled 2017-02-23: qty 1

## 2017-02-23 MED ORDER — IBUPROFEN 200 MG PO TABS
600.0000 mg | ORAL_TABLET | Freq: Three times a day (TID) | ORAL | Status: DC | PRN
  Administered 2017-02-23 – 2017-02-24 (×3): 600 mg via ORAL
  Filled 2017-02-23 (×3): qty 1

## 2017-02-23 MED ORDER — LEVOTHYROXINE SODIUM 125 MCG PO TABS
125.0000 ug | ORAL_TABLET | Freq: Every day | ORAL | Status: DC
  Administered 2017-02-24: 125 ug via ORAL
  Filled 2017-02-23: qty 1

## 2017-02-23 MED ORDER — DIAZEPAM 5 MG PO TABS: 5.0000 mg | ORAL_TABLET | Freq: Every evening | ORAL | Status: DC | PRN

## 2017-02-23 MED ORDER — ZOLPIDEM TARTRATE 5 MG PO TABS
5.0000 mg | ORAL_TABLET | Freq: Every day | ORAL | Status: DC
  Administered 2017-02-23: 5 mg via ORAL
  Filled 2017-02-23: qty 1

## 2017-02-23 MED ORDER — MAGNESIUM OXIDE 400 (241.3 MG) MG PO TABS
800.0000 mg | ORAL_TABLET | Freq: Two times a day (BID) | ORAL | Status: DC
  Administered 2017-02-23 – 2017-02-24 (×2): 800 mg via ORAL
  Filled 2017-02-23 (×2): qty 2

## 2017-02-23 MED ORDER — ALBUTEROL SULFATE HFA 108 (90 BASE) MCG/ACT IN AERS: 2.0000 | INHALATION_SPRAY | Freq: Four times a day (QID) | RESPIRATORY_TRACT | Status: DC | PRN

## 2017-02-23 MED ORDER — GABAPENTIN 300 MG PO CAPS
300.0000 mg | ORAL_CAPSULE | Freq: Three times a day (TID) | ORAL | Status: DC
  Administered 2017-02-23 – 2017-02-24 (×2): 300 mg via ORAL
  Filled 2017-02-23 (×2): qty 1

## 2017-02-23 MED ORDER — AMLODIPINE BESYLATE 5 MG PO TABS
10.0000 mg | ORAL_TABLET | Freq: Every day | ORAL | Status: DC
  Administered 2017-02-24: 10 mg via ORAL
  Filled 2017-02-23: qty 2

## 2017-02-23 MED ORDER — HYDROXYZINE PAMOATE 25 MG PO CAPS
25.0000 mg | ORAL_CAPSULE | Freq: Three times a day (TID) | ORAL | Status: DC | PRN
  Administered 2017-02-23: 25 mg via ORAL
  Filled 2017-02-23 (×2): qty 1

## 2017-02-23 MED ORDER — ROPINIROLE HCL 1 MG PO TABS
1.0000 mg | ORAL_TABLET | Freq: Every day | ORAL | Status: DC
  Administered 2017-02-23: 1 mg via ORAL
  Filled 2017-02-23: qty 1

## 2017-02-23 NOTE — ED Notes (Signed)
Regular Diet ordered for Dinner. 

## 2017-02-23 NOTE — ED Triage Notes (Signed)
Pt brought in by [employer]. They reported she called them this morning and stated if they did not come in 10 minutes then she was going to kill herself. [employer] officer reported they arrived on scene and pt told them she was afraid she would harm herself. No weapons other than standard kitchen knives in home, per [facility] officer. Pt states she does not remember this. When asked if she is having thoughts of hurting herself she stated "I don't know. I guess I will if I dont get some help." Pt reports she wants help to go to sleep. She does not know when the last time she was able to sleep.

## 2017-02-23 NOTE — ED Notes (Signed)
Snack provided

## 2017-02-23 NOTE — ED Provider Notes (Signed)
[facility] DEPT Provider Note   CSN: [id number] Arrival date & time: 02/23/17  1301     History   Chief Complaint Chief Complaint  Patient presents with  . Suicidal    HPI Audrey Meyer is a 61 y.o. female.  HPI  61 y.o. female with a hx of Breast Cancer, COPD, HTN, Schizoaffective Disorder, presents to the Emergency Department today complaining of suicidal ideation PTA. Pt was brought in by [employer]. Pt states that this morning she was afraid that she was going to kill herself. Did not endorse plan of action. States that she called [employer] and said that if they did not arrive in 10 minutes, she was going to kill herself. When [employer] asked if she had thoughts of harming herself, she states that "I don't know. I guess I will if I dont get some help." Pt has history of suicidal attempt with OD on home medications. No HI. No visual or auditory hallucinations. Does endorse seeing advertisements on her phone and hearing noises, but states that she called [employer] and they said (per patient) that this was normal and that it was from her phone. No CP/SOB/ABD pain. No N/V/D. No dysuria. Noted low back pain on right side. Hx same. No trauma. No loss of bowel or bladder function. No saddle anesthesia. No fevers. No other symptoms noted.   Past Medical History:  Diagnosis Date  . Acid reflux   . Breast cancer ([place])   . COPD (chronic obstructive pulmonary disease) ([place])   . High cholesterol   . Hypertension   . Hypothyroid   . Schizoaffective disorder     Patient Active Problem List   Diagnosis Date Noted  . Hyponatremia 01/31/2017  . Hypomagnesemia 01/31/2017  . Major depressive disorder, recurrent episode, moderate ([place]) 04/28/2016  . Chronic obstructive pulmonary disease ([place])   . Hypokalemia 03/07/2016  . Hypothyroidism 03/07/2016  . Schizoaffective disorder, depressive type ([place]) 03/07/2016  . Essential hypertension 03/07/2016  . Tobacco abuse 03/07/2016    Past Surgical History:    Procedure Laterality Date  . APPENDECTOMY    . MASTECTOMY, PARTIAL    . TUBAL LIGATION      OB History    No data available       Home Medications    Prior to Admission medications   Medication Sig Start Date End Date Taking? Authorizing Provider  acetaminophen (TYLENOL) 500 MG tablet Take 1,000 mg by mouth every 6 (six) hours as needed for mild pain, moderate pain, fever or headache.    Historical Provider, MD  albuterol (PROVENTIL HFA;VENTOLIN HFA) 108 (90 Base) MCG/ACT inhaler Inhale 2 puffs into the lungs every 6 (six) hours as needed for wheezing or shortness of breath.    Historical Provider, MD  atorvastatin (LIPITOR) 20 MG tablet Take 1 tablet (20 mg total) by mouth at bedtime. 05/05/16   Benjamine Mola, FNP  cephALEXin (KEFLEX) 500 MG capsule Take 1 capsule (500 mg total) by mouth 2 (two) times daily. 02/04/17   Okey Regal, PA-C  diazepam (VALIUM) 5 MG tablet Take 1 tablet (5 mg total) by mouth at bedtime as needed for anxiety. 02/04/17   Okey Regal, PA-C  gabapentin (NEURONTIN) 300 MG capsule Take 300 mg by mouth 2 (two) times daily.     Historical Provider, MD  hydrOXYzine (VISTARIL) 25 MG capsule Take 1 capsule (25 mg total) by mouth 3 (three) times daily as needed for anxiety. 05/05/16   Benjamine Mola, FNP  levothyroxine (SYNTHROID, LEVOTHROID)  125 MCG tablet Take 125 mcg by mouth daily before breakfast. *Brand Name Only*    Historical Provider, MD  lisinopril (PRINIVIL,ZESTRIL) 10 MG tablet Take 10 mg by mouth daily.    Historical Provider, MD  magnesium oxide (MAG-OX) 400 MG tablet Take 2 tablets (800 mg total) by mouth 2 (two) times daily. 02/03/17   Alfonzo Beers, MD  ondansetron (ZOFRAN ODT) 4 MG disintegrating tablet Take 1 tablet (4 mg total) by mouth every 8 (eight) hours as needed for nausea or vomiting. 02/05/17   Leo Grosser, MD  ondansetron (ZOFRAN) 4 MG tablet Take 1 tablet (4 mg total) by mouth every 6 (six) hours as needed for nausea. Patient not taking:  Reported on 02/07/2017 3/66/29   Delora Fuel, MD  sertraline (ZOLOFT) 25 MG tablet Take 75 mg by mouth daily with breakfast.     Historical Provider, MD  trazodone (DESYREL) 300 MG tablet Take 300 mg by mouth at bedtime.    Historical Provider, MD    Family History Family History  Problem Relation Age of Onset  . Heart attack Father   . Hypertension Father   . Cancer Other     Social History Social History  Substance Use Topics  . Smoking status: Current Every Day Smoker    Packs/day: 0.00    Years: 2.00    Types: Cigars  . Smokeless tobacco: Never Used  . Alcohol use No     Allergies   Budesonide and Promethazine   Review of Systems Review of Systems ROS reviewed and all are negative for acute change except as noted in the HPI  Physical Exam Updated Vital Signs BP 123/68 (BP Location: Right Arm)   Pulse 78   Temp 99.8 F (37.7 C) (Oral)   Resp 16   Ht 5\' 2"  (1.575 m)   Wt 59 kg   SpO2 97%   BMI 23.78 kg/m   Physical Exam  Constitutional: She is oriented to person, place, and time. Vital signs are normal. She appears well-developed and well-nourished.  HENT:  Head: Normocephalic and atraumatic.  Right Ear: Hearing normal.  Left Ear: Hearing normal.  Eyes: Conjunctivae and EOM are normal. Pupils are equal, round, and reactive to light.  Neck: Neck supple.  Cardiovascular: Normal rate, regular rhythm, normal heart sounds and intact distal pulses.   Pulmonary/Chest: Effort normal.  Abdominal: Soft. There is no tenderness.  Musculoskeletal: Normal range of motion.  No midline spinous process tenderness. TTP lower lumbar musculature. No palpable or visible deformities.   Neurological: She is alert and oriented to person, place, and time.  Skin: Skin is warm and dry.  Psychiatric: She has a normal mood and affect. Her speech is normal and behavior is normal. Thought content normal.  Nursing note and vitals reviewed.  ED Treatments / Results  Labs (all labs  ordered are listed, but only abnormal results are displayed) Labs Reviewed  COMPREHENSIVE METABOLIC PANEL - Abnormal; Notable for the following:       Result Value   Glucose, Bld 107 (*)    AST 45 (*)    ALT 57 (*)    All other components within normal limits  ETHANOL  SALICYLATE LEVEL  ACETAMINOPHEN LEVEL  CBC  RAPID URINE DRUG SCREEN, HOSP PERFORMED    EKG  EKG Interpretation None       Radiology No results found.  Procedures Procedures (including critical care time)  Medications Ordered in ED Medications  acetaminophen (TYLENOL) 325 MG tablet (not administered)  ibuprofen (ADVIL,MOTRIN) tablet 600 mg (not administered)  acetaminophen (TYLENOL) tablet 650 mg (650 mg Oral Given 02/23/17 1400)   Initial Impression / Assessment and Plan / ED Course  I have reviewed the triage vital signs and the nursing notes.  Pertinent labs & imaging results that were available during my care of the patient were reviewed by me and considered in my medical decision making (see chart for details).  Final Clinical Impressions(s) / ED Diagnoses  {I have reviewed and evaluated the relevant laboratory values.   {I have reviewed the relevant previous healthcare records.  {I obtained HPI from historian.   ED Course:  Assessment: Pt is a 61 y.o. female with hx Breast Cancer, COPD, HTN, Schizoaffective Disorder who presents with suicidal ideation PTA. Pt was brought in by [employer]. Pt states that this morning she was afraid that she was going to kill herself. Did not endorse plan of action. States that she called [employer] and said that if they did not arrive in 10 minutes, she was going to kill herself. When [employer] asked if she had thoughts of harming herself, she states that "I don't know. I guess I will if I dont get some help." Pt has history of suicidal attempt with OD on home medications. No HI. No visual or auditory hallucinations.. On exam, pt in NAD. Nontoxic/nonseptic appearing. VSS. Afebrile. Lungs  CTA. Heart RRR. Abdomen nontender soft. Labs unremarkable. Will eval with TTS. Pt medically cleared for evaluation.   Disposition/Plan:  Pending TTS Consult Pt acknowledges and agrees with plan  Supervising Physician Gareth Morgan, MD  Final diagnoses:  Suicidal ideation  Acute right-sided low back pain without sciatica  Strain of lumbar region, initial encounter    New Prescriptions New Prescriptions   No medications on file     Shary Decamp, PA-C 02/23/17 [place], MD 02/24/17 [phone]

## 2017-02-23 NOTE — ED Notes (Signed)
Pt c/o back pain

## 2017-02-23 NOTE — ED Triage Notes (Signed)
TTS  In process  

## 2017-02-23 NOTE — ED Notes (Signed)
Pt still coming to nurses station every 5 min. Instructed pt to return to room.

## 2017-02-23 NOTE — ED Notes (Signed)
Pt continues to come to nurses station, approx 10-15X in past hr. Word search was provided to pt to keep busy.

## 2017-02-23 NOTE — ED Notes (Signed)
ED Provider at bedside. 
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
## 2017-02-23 NOTE — ED Notes (Signed)
Pt changed into wine colored scrubs. Security called for wand

## 2017-02-23 NOTE — ED Notes (Signed)
Lunch tray provided. Patient sitting in visualization of triage nursing staff

## 2017-02-23 NOTE — ED Notes (Signed)
Pt requesting something to "sleep" informed pt that we would provide medication closer to bed time... It was only 7pm.

## 2017-02-24 DIAGNOSIS — F17210 Nicotine dependence, cigarettes, uncomplicated: Secondary | ICD-10-CM | POA: Diagnosis not present

## 2017-02-24 DIAGNOSIS — F410 Panic disorder [episodic paroxysmal anxiety] without agoraphobia: Secondary | ICD-10-CM

## 2017-02-24 DIAGNOSIS — F251 Schizoaffective disorder, depressive type: Secondary | ICD-10-CM | POA: Diagnosis not present

## 2017-02-24 DIAGNOSIS — Z79899 Other long term (current) drug therapy: Secondary | ICD-10-CM | POA: Diagnosis not present

## 2017-02-24 MED ORDER — HYDROXYZINE HCL 25 MG PO TABS
25.0000 mg | ORAL_TABLET | Freq: Three times a day (TID) | ORAL | Status: DC | PRN
  Administered 2017-02-24 (×2): 25 mg via ORAL
  Filled 2017-02-24 (×2): qty 1

## 2017-02-24 MED ORDER — ONDANSETRON 4 MG PO TBDP
4.0000 mg | ORAL_TABLET | Freq: Once | ORAL | Status: AC
  Administered 2017-02-24: 4 mg via ORAL
  Filled 2017-02-24: qty 1

## 2017-02-24 NOTE — Consult Note (Signed)
[place] Psychiatry Consult   Reason for Consult:  Psych evaluation Referring Physician:  Dr. Dwyane Dee Patient Identification: Audrey Meyer MRN:  [id number] Principal Diagnosis: Schizoaffective disorder, depressive type [facility]) Diagnosis:   Patient Active Problem List   Diagnosis Date Noted  . Hyponatremia [E87.1] 01/31/2017  . Hypomagnesemia [E83.42] 01/31/2017  . Major depressive disorder, recurrent episode, moderate ([place]) [F33.1] 04/28/2016  . Chronic obstructive pulmonary disease ([place]) [J44.9]   . Hypokalemia [E87.6] 03/07/2016  . Hypothyroidism [E03.9] 03/07/2016  . Schizoaffective disorder, depressive type ([place]) [F25.1] 03/07/2016  . Essential hypertension [I10] 03/07/2016  . Tobacco abuse [Z72.0] 03/07/2016    Total Time spent with patient: 45 minutes  Subjective:   Audrey Meyer is a 61 y.o. female patient admitted with anxiety. Patient seen face to face for this psych consultation and evaluation. Case discussed with staff RN. Patient appeared lying on her bed, calm and cooperative with my evaluation. Patient denied current suicide or homicide ideations. She reported recent admission to [facility] for ten days and she felt panic when not able to take her hydroxyzine. She endorses calling police and telling them suicide to get into hospital quicker. She feels anxious about being in her bed too long and wants to be treated as out patient. She denied current symptoms of depression, panic episodes, and no evidence of psychosis. She does not meet criteria for in patient treatment. Patient has scheduled psychiatric appointment with provider on Monday and willing to follow up.   HPI:  Audrey Meyer is an 61 y.o. female presenting voluntarily to [facility] after calling the police. Patient states that she called the police and stated that she was going to kill herself "because I knew they would come quicker." Patient states that she was discharged from  [facility] unit yesterday. Patient states that she is not currently suicidal. Patient denies self injurious behaviors. Patient states that she has had two previous attempts with the last attempt being last year by attempting to overdose. Patient states that she attempted to overdose on Trazodone "because I felt so tired and worn out." Patient states that she is stressed due to disclosing information in her inpatient admission at [facility] about her relationship with her family that she had not previously disclosed. Patient states that she has also been having financial problems. Patient endorses symptoms of depression as; insomnia, tearfulness, isolation, and anhedonia. Patient states that she sees Fred at [employer] who has prescribed her medications and she feels that her symptoms have improved. Patient states that she has taken the medication as prescribed although she would like something for sleep. Patient states that she has an appointment with Josph Macho on Monday and she feels that she can wait until then to get an additional medication for sleep. Patient denies homicidal ideations and history of aggression. Patient denies access to weapons. Patient denies pending charges and upcoming court dates. Patient denies active probation. Patient denies auditory and visual hallucinations. Patient does not appear to be responding to internal stimuli. Patient denies use of drugs and alcohol and patients UDS clear and BAL <5 at time of assessment. Patient states "I want to go home, I just want something to help me sleep." Patient contracts for safety at time of assessment. Consulted with Jinny Blossom, NP who recommends overnight observation and evaluation by psychiatry in the morning.      Past Psychiatric History: Schizoaffective Disorder per patient history and recent admission at [facility] behavioral health and  out patient care at [facility].  Risk to Self: Suicidal Ideation:  No Suicidal Intent: No Is patient at risk for suicide?: No Suicidal Plan?: No Specify Current Suicidal Plan: Denies Access to Means: No Specify Access to Suicidal Means: Denies What has been your use of drugs/alcohol within the last 12 months?: Denies How many times?: 2 (last year - overdose on Trazadone) Other Self Harm Risks: Denies Triggers for Past Attempts: Other (Comment) ("I felt so tired and worn out") Intentional Self Injurious Behavior: None Risk to Others: Homicidal Ideation: No Thoughts of Harm to Others: No Current Homicidal Intent: No Current Homicidal Plan: No Access to Homicidal Means: No Identified Victim: Denies History of harm to others?: No Assessment of Violence: None Noted Violent Behavior Description: Denies Does patient have access to weapons?: No Criminal Charges Pending?: No Does patient have a court date: No Prior Inpatient Therapy: Prior Inpatient Therapy: Yes Prior Therapy Dates: 02/2017 Prior Therapy Facilty/Provider(s): [facility]  Reason for Treatment: Depression Prior Outpatient Therapy: Prior Outpatient Therapy: Yes Prior Therapy Dates: Present Prior Therapy Facilty/Provider(s): [facility]  Reason for Treatment: Depression Does patient have an ACCT team?: No Does patient have Intensive In-House Services?  : No Does patient have [facility] services? : Yes Does patient have [facility] services?: No  Past Medical History:  Past Medical History:  Diagnosis Date  . Acid reflux   . Breast cancer ([place])   . COPD (chronic obstructive pulmonary disease) ([place])   . High cholesterol   . Hypertension   . Hypothyroid   . Schizoaffective disorder     Past Surgical History:  Procedure Laterality Date  . APPENDECTOMY    . MASTECTOMY, PARTIAL    . TUBAL LIGATION     Family History:  Family History  Problem Relation Age of Onset  . Heart attack Father   . Hypertension Father   . Cancer Other    Family Psychiatric  History: non-contributory.  Social  History:  History  Alcohol Use No     History  Drug Use No    Social History   Social History  . Marital status: Legally Separated    Spouse name: N/A  . Number of children: N/A  . Years of education: N/A   Social History Main Topics  . Smoking status: Current Every Day Smoker    Packs/day: 0.00    Years: 2.00    Types: Cigars  . Smokeless tobacco: Never Used  . Alcohol use No  . Drug use: No  . Sexual activity: No   Other Topics Concern  . None   Social History Narrative  . None   Additional Social History:    Allergies:   Allergies  Allergen Reactions  . Budesonide Shortness Of Breath  . Promethazine Nausea Only    Labs:  Results for orders placed or performed during the hospital encounter of 02/23/17 (from the past 48 hour(s))  Comprehensive metabolic panel     Status: Abnormal   Collection Time: 02/23/17  1:12 PM  Result Value Ref Range   Sodium 137 135 - 145 mmol/L   Potassium 3.9 3.5 - 5.1 mmol/L   Chloride 103 101 - 111 mmol/L   CO2 25 22 - 32 mmol/L   Glucose, Bld 107 (H) 65 - 99 mg/dL   BUN 8 6 - 20 mg/dL   Creatinine, Ser 0.72 0.44 - 1.00 mg/dL   Calcium 9.7 8.9 - 10.3 mg/dL   Total Protein 7.1 6.5 - 8.1 g/dL   Albumin 4.3 3.5 -  5.0 g/dL   AST 45 (H) 15 - 41 U/L   ALT 57 (H) 14 - 54 U/L   Alkaline Phosphatase 84 38 - 126 U/L   Total Bilirubin 0.7 0.3 - 1.2 mg/dL   GFR calc non Af Amer >60 >60 mL/min   GFR calc Af Amer >60 >60 mL/min    Comment: (NOTE) The eGFR has been calculated using the CKD EPI equation. This calculation has not been validated in all clinical situations. eGFR's persistently <60 mL/min signify possible Chronic Kidney Disease.    Anion gap 9 5 - 15  Ethanol     Status: None   Collection Time: 02/23/17  1:12 PM  Result Value Ref Range   Alcohol, Ethyl (B) <5 <5 mg/dL    Comment:        LOWEST DETECTABLE LIMIT FOR SERUM ALCOHOL IS 5 mg/dL FOR MEDICAL PURPOSES ONLY   Salicylate level     Status: None   Collection  Time: 02/23/17  1:12 PM  Result Value Ref Range   Salicylate Lvl <8.8 2.8 - 30.0 mg/dL  Acetaminophen level     Status: None   Collection Time: 02/23/17  1:12 PM  Result Value Ref Range   Acetaminophen (Tylenol), Serum 14 10 - 30 ug/mL    Comment:        THERAPEUTIC CONCENTRATIONS VARY SIGNIFICANTLY. A RANGE OF 10-30 ug/mL MAY BE AN EFFECTIVE CONCENTRATION FOR MANY PATIENTS. HOWEVER, SOME ARE BEST TREATED AT CONCENTRATIONS OUTSIDE THIS RANGE. ACETAMINOPHEN CONCENTRATIONS >150 ug/mL AT 4 HOURS AFTER INGESTION AND >50 ug/mL AT 12 HOURS AFTER INGESTION ARE OFTEN ASSOCIATED WITH TOXIC REACTIONS.   cbc     Status: None   Collection Time: 02/23/17  1:12 PM  Result Value Ref Range   WBC 8.0 4.0 - 10.5 K/uL   RBC 4.03 3.87 - 5.11 MIL/uL   Hemoglobin 12.2 12.0 - 15.0 g/dL   HCT 36.2 36.0 - 46.0 %   MCV 89.8 78.0 - 100.0 fL   MCH 30.3 26.0 - 34.0 pg   MCHC 33.7 30.0 - 36.0 g/dL   RDW 13.6 11.5 - 15.5 %   Platelets 331 150 - 400 K/uL  Rapid urine drug screen (hospital performed)     Status: None   Collection Time: 02/23/17  1:31 PM  Result Value Ref Range   Opiates NONE DETECTED NONE DETECTED   Cocaine NONE DETECTED NONE DETECTED   Benzodiazepines NONE DETECTED NONE DETECTED   Amphetamines NONE DETECTED NONE DETECTED   Tetrahydrocannabinol NONE DETECTED NONE DETECTED   Barbiturates NONE DETECTED NONE DETECTED    Comment:        DRUG SCREEN FOR MEDICAL PURPOSES ONLY.  IF CONFIRMATION IS NEEDED FOR ANY PURPOSE, NOTIFY LAB WITHIN 5 DAYS.        LOWEST DETECTABLE LIMITS FOR URINE DRUG SCREEN Drug Class       Cutoff (ng/mL) Amphetamine      1000 Barbiturate      200 Benzodiazepine   502 Tricyclics       774 Opiates          300 Cocaine          300 THC              50     Current Facility-Administered Medications  Medication Dose Route Frequency Provider Last Rate Last Dose  . albuterol (PROVENTIL HFA;VENTOLIN HFA) 108 (90 Base) MCG/ACT inhaler 2 puff  2 puff  Inhalation Q6H PRN Shary Decamp, PA-C      .  amLODipine (NORVASC) tablet 10 mg  10 mg Oral Daily Isla Pence, MD   10 mg at 02/24/17 0909  . atorvastatin (LIPITOR) tablet 20 mg  20 mg Oral QHS Isla Pence, MD   20 mg at 02/23/17 2235  . diazepam (VALIUM) tablet 5 mg  5 mg Oral QHS PRN Isla Pence, MD      . furosemide (LASIX) tablet 10 mg  10 mg Oral Daily Isla Pence, MD   10 mg at 02/24/17 0908  . gabapentin (NEURONTIN) capsule 300 mg  300 mg Oral TID Isla Pence, MD   300 mg at 02/24/17 0910  . hydrOXYzine (ATARAX/VISTARIL) tablet 25 mg  25 mg Oral TID PRN Shary Decamp, PA-C   25 mg at 02/24/17 0605  . ibuprofen (ADVIL,MOTRIN) tablet 600 mg  600 mg Oral Q8H PRN Shary Decamp, PA-C   600 mg at 02/24/17 0004  . levothyroxine (SYNTHROID, LEVOTHROID) tablet 125 mcg  125 mcg Oral QAC breakfast Shary Decamp, PA-C   125 mcg at 02/24/17 [phone]  . lisinopril (PRINIVIL,ZESTRIL) tablet 10 mg  10 mg Oral Daily Shary Decamp, PA-C   10 mg at 02/24/17 1950  . magnesium oxide (MAG-OX) tablet 800 mg  800 mg Oral BID Isla Pence, MD   800 mg at 02/24/17 0910  . rOPINIRole (REQUIP) tablet 1 mg  1 mg Oral QHS Isla Pence, MD   1 mg at 02/23/17 2235  . sertraline (ZOLOFT) tablet 75 mg  75 mg Oral Q breakfast Isla Pence, MD   75 mg at 02/24/17 0858  . thiothixene (NAVANE) capsule 2 mg  2 mg Oral TID Isla Pence, MD   2 mg at 02/24/17 0910  . zolpidem (AMBIEN) tablet 5 mg  5 mg Oral QHS Isla Pence, MD   5 mg at 02/23/17 2235   Current Outpatient Prescriptions  Medication Sig Dispense Refill  . acetaminophen (TYLENOL) 500 MG tablet Take 1,000 mg by mouth every 6 (six) hours as needed for mild pain, moderate pain, fever or headache.    . albuterol (PROVENTIL HFA;VENTOLIN HFA) 108 (90 Base) MCG/ACT inhaler Inhale 2 puffs into the lungs every 6 (six) hours as needed for wheezing or shortness of breath.    Marland Kitchen amLODipine (NORVASC) 10 MG tablet Take 10 mg by mouth daily.    Marland Kitchen atorvastatin (LIPITOR) 20 MG  tablet Take 1 tablet (20 mg total) by mouth at bedtime.  0  . diazepam (VALIUM) 5 MG tablet Take 1 tablet (5 mg total) by mouth at bedtime as needed for anxiety. 10 tablet 0  . furosemide (LASIX) 20 MG tablet Take 10 mg by mouth daily.    Marland Kitchen gabapentin (NEURONTIN) 300 MG capsule Take 300 mg by mouth 3 (three) times daily.     . hydrOXYzine (VISTARIL) 25 MG capsule Take 1 capsule (25 mg total) by mouth 3 (three) times daily as needed for anxiety. 30 capsule 0  . levothyroxine (SYNTHROID, LEVOTHROID) 125 MCG tablet Take 125 mcg by mouth daily before breakfast. *Brand Name Only*    . lisinopril (PRINIVIL,ZESTRIL) 10 MG tablet Take 10 mg by mouth daily.    . magnesium oxide (MAG-OX) 400 MG tablet Take 2 tablets (800 mg total) by mouth 2 (two) times daily. 120 tablet 0  . ondansetron (ZOFRAN ODT) 4 MG disintegrating tablet Take 1 tablet (4 mg total) by mouth every 8 (eight) hours as needed for nausea or vomiting. 4 tablet 0  . potassium chloride SA (K-DUR,KLOR-CON) 20 MEQ tablet Take 20 mEq by  mouth 3 (three) times daily.    . promethazine (PHENERGAN) 25 MG tablet Take 25 mg by mouth 3 (three) times daily as needed for nausea or vomiting.    Marland Kitchen rOPINIRole (REQUIP) 1 MG tablet Take 1 mg by mouth at bedtime.    . sertraline (ZOLOFT) 25 MG tablet Take 75 mg by mouth daily with breakfast.     . thiothixene (NAVANE) 2 MG capsule Take 2 mg by mouth 3 (three) times daily.    Marland Kitchen zolpidem (AMBIEN) 5 MG tablet Take 5 mg by mouth at bedtime.    . cephALEXin (KEFLEX) 500 MG capsule Take 1 capsule (500 mg total) by mouth 2 (two) times daily. (Patient not taking: Reported on 02/23/2017) 10 capsule 0  . ondansetron (ZOFRAN) 4 MG tablet Take 1 tablet (4 mg total) by mouth every 6 (six) hours as needed for nausea. (Patient not taking: Reported on 02/07/2017) 12 tablet 0    Musculoskeletal: Strength & Muscle Tone: within normal limits Gait & Station: normal Patient leans: N/A  Psychiatric Specialty Exam: Physical Exam  Full physical performed in Emergency Department. I have reviewed this assessment and concur with its findings.   ROS generalized anxiety and denied SOB, Chest pain and sweating.  No Fever-chills, No Headache, No changes with Vision or hearing, reports vertigo No problems swallowing food or Liquids, No Chest pain, Cough or Shortness of Breath, No Abdominal pain, No Nausea or Vommitting, Bowel movements are regular, No Blood in stool or Urine, No dysuria, No new skin rashes or bruises, No new joints pains-aches,  No new weakness, tingling, numbness in any extremity, No recent weight gain or loss, No polyuria, polydypsia or polyphagia,   A full 10 point Review of Systems was done, except as stated above, all other Review of Systems were negative.  Blood pressure (!) 124/47, pulse 60, temperature 98.1 F (36.7 C), temperature source Oral, resp. rate 19, height 5' 2" (1.575 m), weight 59 kg (130 lb), SpO2 96 %.Body mass index is 23.78 kg/m.  General Appearance: Casual  Eye Contact:  Good  Speech:  Clear and Coherent  Volume:  Normal  Mood:  Anxious  Affect:  Appropriate and Congruent  Thought Process:  Coherent and Goal Directed  Orientation:  Full (Time, Place, and Person)  Thought Content:  WDL  Suicidal Thoughts:  No  Homicidal Thoughts:  No  Memory:  Immediate;   Good Recent;   Fair Remote;   Fair  Judgement:  Fair  Insight:  Good  Psychomotor Activity:  Normal  Concentration:  Concentration: Fair and Attention Span: Fair  Recall:  Good  Fund of Knowledge:  Good  Language:  Good  Akathisia:  Negative  Handed:  Right  AIMS (if indicated):     Assets:  Communication Skills Desire for Improvement Financial Resources/Insurance Housing Leisure Time Physical Health Resilience Social Support Transportation  ADL's:  Intact  Cognition:  WNL  Sleep:        Treatment Plan Summary: 61 years old separated, lives along, has been on disability and recently discharged from  acute psych hospital at [facility] behavioral health after bearing there for 10 days. She is calm and coopeative. She denied current suicide or homicide ideation, intention or plans. She has no evidence of psychosis.  No medication changed made during this evaluation or hospital visit No safety concerns Recommend to comply with her current medication treatment and follow up with out patient treatment at [facility] on Monday as scheduled  Disposition: Patient is  psychiatrically cleared for disposition No evidence of imminent risk to self or others at present.   Supportive therapy provided about ongoing stressors.  Ambrose Finland, MD 02/24/2017 11:24 AM

## 2017-02-24 NOTE — ED Notes (Signed)
Pt woke up and stated her sheets were damp from sweating. Clean scrubs provided and clean sheets placed on bed.

## 2017-02-24 NOTE — ED Provider Notes (Signed)
The patient has been seen by psychiatry, who feel that she is stable for discharge.  At this time the patient is calm and comfortable and cooperative.  She plans on following up with her therapist, in 3 days.  She wondered about changing to a new sleeping pill, because the Ambien is not working and I asked her to see her PCP about that.   [name], MD 02/24/17 [phone]

## 2017-02-24 NOTE — ED Notes (Signed)
Dr. Athena Masse at bedside

## 2017-02-24 NOTE — ED Notes (Addendum)
Pt asking for graham crackers.  Patient informed of snack rules of the unit.  Pt then asked for something for nausea.  Left note for MD.

## 2017-02-24 NOTE — Discharge Instructions (Signed)
See your therapist/counselor on Monday as scheduled.

## 2017-02-27 ENCOUNTER — Emergency Department (HOSPITAL_COMMUNITY)
Admission: EM | Admit: 2017-02-27 | Discharge: 2017-02-27 | Discharge status: Against Medical Advice | Payer: Medicare Other | Attending: Emergency Medicine | Admitting: Emergency Medicine

## 2017-02-27 ENCOUNTER — Encounter (HOSPITAL_COMMUNITY): Payer: Self-pay | Admitting: Emergency Medicine

## 2017-02-27 DIAGNOSIS — Z853 Personal history of malignant neoplasm of breast: Secondary | ICD-10-CM | POA: Insufficient documentation

## 2017-02-27 DIAGNOSIS — J449 Chronic obstructive pulmonary disease, unspecified: Secondary | ICD-10-CM | POA: Diagnosis not present

## 2017-02-27 DIAGNOSIS — F17290 Nicotine dependence, other tobacco product, uncomplicated: Secondary | ICD-10-CM | POA: Diagnosis not present

## 2017-02-27 DIAGNOSIS — F419 Anxiety disorder, unspecified: Secondary | ICD-10-CM | POA: Insufficient documentation

## 2017-02-27 DIAGNOSIS — I10 Essential (primary) hypertension: Secondary | ICD-10-CM | POA: Diagnosis not present

## 2017-02-27 DIAGNOSIS — Z79899 Other long term (current) drug therapy: Secondary | ICD-10-CM | POA: Insufficient documentation

## 2017-02-27 DIAGNOSIS — E039 Hypothyroidism, unspecified: Secondary | ICD-10-CM | POA: Insufficient documentation

## 2017-02-27 DIAGNOSIS — F489 Nonpsychotic mental disorder, unspecified: Secondary | ICD-10-CM

## 2017-02-27 LAB — RAPID URINE DRUG SCREEN, HOSP PERFORMED
AMPHETAMINES: NOT DETECTED
BENZODIAZEPINES: POSITIVE — AB
Barbiturates: NOT DETECTED
Cocaine: NOT DETECTED
OPIATES: NOT DETECTED
Tetrahydrocannabinol: NOT DETECTED

## 2017-02-27 MED ORDER — AMLODIPINE BESYLATE 5 MG PO TABS: 10.0000 mg | ORAL_TABLET | Freq: Every day | ORAL | Status: DC

## 2017-02-27 MED ORDER — GABAPENTIN 300 MG PO CAPS: 300.0000 mg | ORAL_CAPSULE | Freq: Three times a day (TID) | ORAL | Status: DC

## 2017-02-27 MED ORDER — FUROSEMIDE 20 MG PO TABS: 10.0000 mg | ORAL_TABLET | Freq: Every day | ORAL | Status: DC

## 2017-02-27 MED ORDER — LISINOPRIL 10 MG PO TABS: 10.0000 mg | ORAL_TABLET | Freq: Every day | ORAL | Status: DC

## 2017-02-27 MED ORDER — THIOTHIXENE 2 MG PO CAPS: 2.0000 mg | ORAL_CAPSULE | Freq: Three times a day (TID) | ORAL | Status: DC

## 2017-02-27 MED ORDER — SERTRALINE HCL 50 MG PO TABS: 75.0000 mg | ORAL_TABLET | Freq: Every day | ORAL | Status: DC

## 2017-02-27 MED ORDER — ALBUTEROL SULFATE HFA 108 (90 BASE) MCG/ACT IN AERS: 2.0000 | INHALATION_SPRAY | Freq: Four times a day (QID) | RESPIRATORY_TRACT | Status: DC | PRN

## 2017-02-27 MED ORDER — LEVOTHYROXINE SODIUM 125 MCG PO TABS: 125.0000 ug | ORAL_TABLET | Freq: Every day | ORAL | Status: DC

## 2017-02-27 MED ORDER — DIAZEPAM 5 MG PO TABS: 5.0000 mg | ORAL_TABLET | Freq: Every evening | ORAL | Status: DC | PRN

## 2017-02-27 MED ORDER — HYDROXYZINE PAMOATE 25 MG PO CAPS: 25.0000 mg | ORAL_CAPSULE | Freq: Three times a day (TID) | ORAL | Status: DC | PRN

## 2017-02-27 MED ORDER — ATORVASTATIN CALCIUM 20 MG PO TABS: 20.0000 mg | ORAL_TABLET | Freq: Every day | ORAL | Status: DC

## 2017-02-27 NOTE — ED Notes (Addendum)
Pt refusing blood draw at this time.

## 2017-02-27 NOTE — ED Notes (Signed)
Dr. Tomi Bamberger made aware of pt leaving.

## 2017-02-27 NOTE — ED Notes (Signed)
Patient wandering halls. Patient is laying in the hall way refusing to speak and go back to room. Patient is not being aggressive just non compliant. Patients spouse and [employer] have tried to help get patient back to room. EDP and PA with patient. Patient is not currently under IVC.

## 2017-02-27 NOTE — ED Notes (Signed)
Pt collcted urine sample in triage
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
## 2017-02-27 NOTE — ED Notes (Signed)
Pt found on the ground and kneeling.  Pt states she would like to go.  Pt informed that we were not finished with our assessment.  Pt stated she did not want to go.  Pt ambulated out of department with a steady gait.  Pt's belongings were fetched and given to security to give to pt.

## 2017-02-27 NOTE — ED Notes (Signed)
Pt observed lying on the floor in front of ED bed 20.  Pt asked to get up and lay in bed.  Pt is nonverbal and did not move.  Pt asked several times with no response.  Pt had to be physically moved by [employer]. Pt changed into wine colored scrubs.

## 2017-02-27 NOTE — ED Provider Notes (Signed)
[place] DEPT Provider Note   CSN: [id number] Arrival date & time: 02/27/17  0973     History   Chief Complaint Chief Complaint  Patient presents with  . [place]  . Medical Clearance    HPI Audrey Meyer is a 61 y.o. female.  HPI Patient presents to the emergency room for evaluation of anxiety, agitation and insomnia. Patient has a history of schizoaffective disorder. She has been seen numerous times in the emergency room for mental health problems. Patient was most recently seen in the emergency room on March 22. At that time, the patient was having vague thoughts of suicidal ideation. Patient was frequently calling police. She told them if they did not arrive in 10 minutes she was going to kill herself. At that time, when the police arrived the patient told them" I don't know I guess I will avoid don't get some help".   Patient was evaluated by psychiatry. She was ultimately discharged after seeing the psychiatrist on March 23. The patient has recently just been discharged from [facility] psychiatric hospital.  The patient denies SI or HI.  Her sx were felt to be related to her chronic illness and she did not require hospitalization.   Pt is living at home.  Her ex husband continues to care for her.  Patient was brought in by her ex-husband today because she has been pacing around the home. She's not sleeping properly. She is not taking her medications properly. A few days ago she mentioned wanting to harm herself.  It's hard to get the patient to communicate with me but she denies any suicidal or homicidal ideation right now.  She denies hearing any voices.   Past Medical History:  Diagnosis Date  . Acid reflux   . Breast cancer ([place])   . COPD (chronic obstructive pulmonary disease) ([place])   . High cholesterol   . Hypertension   . Hypothyroid   . Schizoaffective disorder     Patient Active Problem List   Diagnosis Date Noted  . Hyponatremia 01/31/2017  . Hypomagnesemia  01/31/2017  . Major depressive disorder, recurrent episode, moderate ([place]) 04/28/2016  . Chronic obstructive pulmonary disease ([place])   . Hypokalemia 03/07/2016  . Hypothyroidism 03/07/2016  . Schizoaffective disorder, depressive type ([place]) 03/07/2016  . Essential hypertension 03/07/2016  . Tobacco abuse 03/07/2016    Past Surgical History:  Procedure Laterality Date  . APPENDECTOMY    . MASTECTOMY, PARTIAL    . TUBAL LIGATION      OB History    No data available       Home Medications    Prior to Admission medications   Medication Sig Start Date End Date Taking? Authorizing Provider  acetaminophen (TYLENOL) 500 MG tablet Take 1,000 mg by mouth every 6 (six) hours as needed for mild pain, moderate pain, fever or headache.    Historical Provider, MD  albuterol (PROVENTIL HFA;VENTOLIN HFA) 108 (90 Base) MCG/ACT inhaler Inhale 2 puffs into the lungs every 6 (six) hours as needed for wheezing or shortness of breath.    Historical Provider, MD  amLODipine (NORVASC) 10 MG tablet Take 10 mg by mouth daily.    Historical Provider, MD  atorvastatin (LIPITOR) 20 MG tablet Take 1 tablet (20 mg total) by mouth at bedtime. 05/05/16   Benjamine Mola, FNP  cephALEXin (KEFLEX) 500 MG capsule Take 1 capsule (500 mg total) by mouth 2 (two) times daily. Patient not taking: Reported on 02/23/2017 02/04/17   Okey Regal, PA-C  diazepam (VALIUM) 5 MG tablet Take 1 tablet (5 mg total) by mouth at bedtime as needed for anxiety. 02/04/17   Okey Regal, PA-C  furosemide (LASIX) 20 MG tablet Take 10 mg by mouth daily.    Historical Provider, MD  gabapentin (NEURONTIN) 300 MG capsule Take 300 mg by mouth 3 (three) times daily.     Historical Provider, MD  hydrOXYzine (VISTARIL) 25 MG capsule Take 1 capsule (25 mg total) by mouth 3 (three) times daily as needed for anxiety. 05/05/16   Benjamine Mola, FNP  levothyroxine (SYNTHROID, LEVOTHROID) 125 MCG tablet Take 125 mcg by mouth daily before breakfast. *Brand  Name Only*    Historical Provider, MD  lisinopril (PRINIVIL,ZESTRIL) 10 MG tablet Take 10 mg by mouth daily.    Historical Provider, MD  magnesium oxide (MAG-OX) 400 MG tablet Take 2 tablets (800 mg total) by mouth 2 (two) times daily. 02/03/17   Alfonzo Beers, MD  ondansetron (ZOFRAN ODT) 4 MG disintegrating tablet Take 1 tablet (4 mg total) by mouth every 8 (eight) hours as needed for nausea or vomiting. 02/05/17   Leo Grosser, MD  ondansetron (ZOFRAN) 4 MG tablet Take 1 tablet (4 mg total) by mouth every 6 (six) hours as needed for nausea. Patient not taking: Reported on 02/07/2017 1/61/09   Delora Fuel, MD  potassium chloride SA (K-DUR,KLOR-CON) 20 MEQ tablet Take 20 mEq by mouth 3 (three) times daily.    Historical Provider, MD  promethazine (PHENERGAN) 25 MG tablet Take 25 mg by mouth 3 (three) times daily as needed for nausea or vomiting.    Historical Provider, MD  rOPINIRole (REQUIP) 1 MG tablet Take 1 mg by mouth at bedtime.    Historical Provider, MD  sertraline (ZOLOFT) 25 MG tablet Take 75 mg by mouth daily with breakfast.     Historical Provider, MD  thiothixene (NAVANE) 2 MG capsule Take 2 mg by mouth 3 (three) times daily.    Historical Provider, MD  zolpidem (AMBIEN) 5 MG tablet Take 5 mg by mouth at bedtime.    Historical Provider, MD    Family History Family History  Problem Relation Age of Onset  . Heart attack Father   . Hypertension Father   . Cancer Other     Social History Social History  Substance Use Topics  . Smoking status: Current Every Day Smoker    Packs/day: 0.00    Years: 2.00    Types: Cigars  . Smokeless tobacco: Never Used  . Alcohol use No     Allergies   Budesonide and Promethazine   Review of Systems Review of Systems  All other systems reviewed and are negative.    Physical Exam Updated Vital Signs BP (!) 145/69 (BP Location: Right Arm)   Pulse 82   Temp 97.9 F (36.6 C) (Oral)   Resp 16   Wt 59 kg   SpO2 94%   BMI 23.78 kg/m    Physical Exam  Constitutional:  Constantly pacing around the room, won't sit still  HENT:  Head: Normocephalic and atraumatic.  Right Ear: External ear normal.  Left Ear: External ear normal.  Eyes: Conjunctivae are normal. Right eye exhibits no discharge. Left eye exhibits no discharge. No scleral icterus.  Neck: Neck supple. No tracheal deviation present.  Cardiovascular: Normal rate, regular rhythm and intact distal pulses.   Pulmonary/Chest: Effort normal and breath sounds normal. No stridor. No respiratory distress. She has no wheezes. She has no rales.  Abdominal: Soft. Bowel  sounds are normal. She exhibits no distension. There is no tenderness. There is no rebound and no guarding.  Musculoskeletal: She exhibits no edema or tenderness.  Neurological: She is alert. She has normal strength. No cranial nerve deficit (no facial droop, extraocular movements intact, no slurred speech) or sensory deficit. She exhibits normal muscle tone. She displays no seizure activity. Coordination normal.  Skin: Skin is warm and dry. No rash noted.  Psychiatric: Her mood appears anxious. Her speech is delayed. She is hyperactive. She is not aggressive, not withdrawn and not combative. She expresses no homicidal and no suicidal ideation. She is noncommunicative.  Pt is constantly pacing around the room, denies SI or HI, denies SI or HI, denies hallucinations  Nursing note and vitals reviewed.    ED Treatments / Results  Labs (all labs ordered are listed, but only abnormal results are displayed) Labs Reviewed  RAPID URINE DRUG SCREEN, HOSP PERFORMED - Abnormal; Notable for the following:       Result Value   Benzodiazepines POSITIVE (*)    All other components within normal limits  COMPREHENSIVE METABOLIC PANEL  ETHANOL  SALICYLATE LEVEL  ACETAMINOPHEN LEVEL  CBC    Procedures Procedures (including critical care time)  Medications Ordered in ED Medications  albuterol (PROVENTIL  HFA;VENTOLIN HFA) 108 (90 Base) MCG/ACT inhaler 2 puff (not administered)  amLODipine (NORVASC) tablet 10 mg (not administered)  atorvastatin (LIPITOR) tablet 20 mg (not administered)  diazepam (VALIUM) tablet 5 mg (not administered)  furosemide (LASIX) tablet 10 mg (not administered)  gabapentin (NEURONTIN) capsule 300 mg (not administered)  hydrOXYzine (VISTARIL) capsule 25 mg (not administered)  levothyroxine (SYNTHROID, LEVOTHROID) tablet 125 mcg (not administered)  lisinopril (PRINIVIL,ZESTRIL) tablet 10 mg (not administered)  sertraline (ZOLOFT) tablet 75 mg (not administered)  thiothixene (NAVANE) capsule 2 mg (not administered)     Initial Impression / Assessment and Plan / ED Course  I have reviewed the triage vital signs and the nursing notes.  Pertinent labs & imaging results that were available during my care of the patient were reviewed by me and considered in my medical decision making (see chart for details).  Pt presents to the ED with recurrent psychiatric problems.  Appears to be her chronic anxiety , agitation.  She denies HI or SI.  Pt does not appear to be hearing any voices.  I explained to the patient we will ask the psychiatry team to talk with her.  I ordered her home psychiatry medications. She will need to stay in the room.  I do not feel that patient is a danger to herself or others.  Did not initiate IVC.  Notified later that ex husband left the room and then the patient eloped before further treatment.   Final Clinical Impressions(s) / ED Diagnoses   Final diagnoses:  Mental health problem      Dorie Rank, MD 02/27/17 [phone]

## 2017-02-27 NOTE — ED Notes (Signed)
Bed: [facility] Expected date:  Expected time:  Means of arrival:  Comments: 

## 2017-02-27 NOTE — ED Notes (Signed)
With chart review pt seen for SI on 02/23/17.

## 2017-04-24 ENCOUNTER — Inpatient Hospital Stay (HOSPITAL_COMMUNITY)
Admission: EM | Admit: 2017-04-24 | Discharge: 2017-04-26 | DRG: 918 | Payer: Medicare Other | Attending: Internal Medicine | Admitting: Internal Medicine

## 2017-04-24 ENCOUNTER — Encounter (HOSPITAL_COMMUNITY): Payer: Self-pay

## 2017-04-24 DIAGNOSIS — I952 Hypotension due to drugs: Secondary | ICD-10-CM | POA: Diagnosis present

## 2017-04-24 DIAGNOSIS — Z888 Allergy status to other drugs, medicaments and biological substances status: Secondary | ICD-10-CM | POA: Diagnosis not present

## 2017-04-24 DIAGNOSIS — T50902A Poisoning by unspecified drugs, medicaments and biological substances, intentional self-harm, initial encounter: Secondary | ICD-10-CM

## 2017-04-24 DIAGNOSIS — I10 Essential (primary) hypertension: Secondary | ICD-10-CM | POA: Diagnosis present

## 2017-04-24 DIAGNOSIS — I4581 Long QT syndrome: Secondary | ICD-10-CM | POA: Diagnosis present

## 2017-04-24 DIAGNOSIS — Z915 Personal history of self-harm: Secondary | ICD-10-CM

## 2017-04-24 DIAGNOSIS — F17290 Nicotine dependence, other tobacco product, uncomplicated: Secondary | ICD-10-CM | POA: Diagnosis present

## 2017-04-24 DIAGNOSIS — T43212A Poisoning by selective serotonin and norepinephrine reuptake inhibitors, intentional self-harm, initial encounter: Secondary | ICD-10-CM | POA: Diagnosis present

## 2017-04-24 DIAGNOSIS — T50901A Poisoning by unspecified drugs, medicaments and biological substances, accidental (unintentional), initial encounter: Secondary | ICD-10-CM

## 2017-04-24 DIAGNOSIS — I959 Hypotension, unspecified: Secondary | ICD-10-CM | POA: Diagnosis not present

## 2017-04-24 DIAGNOSIS — E039 Hypothyroidism, unspecified: Secondary | ICD-10-CM | POA: Diagnosis present

## 2017-04-24 DIAGNOSIS — F329 Major depressive disorder, single episode, unspecified: Secondary | ICD-10-CM | POA: Diagnosis not present

## 2017-04-24 DIAGNOSIS — T1491XA Suicide attempt, initial encounter: Secondary | ICD-10-CM

## 2017-04-24 DIAGNOSIS — Z79899 Other long term (current) drug therapy: Secondary | ICD-10-CM | POA: Diagnosis not present

## 2017-04-24 DIAGNOSIS — E7800 Pure hypercholesterolemia, unspecified: Secondary | ICD-10-CM | POA: Diagnosis present

## 2017-04-24 DIAGNOSIS — E876 Hypokalemia: Secondary | ICD-10-CM | POA: Diagnosis present

## 2017-04-24 DIAGNOSIS — F251 Schizoaffective disorder, depressive type: Secondary | ICD-10-CM | POA: Diagnosis present

## 2017-04-24 DIAGNOSIS — J449 Chronic obstructive pulmonary disease, unspecified: Secondary | ICD-10-CM | POA: Diagnosis present

## 2017-04-24 HISTORY — DX: Poisoning by unspecified drugs, medicaments and biological substances, accidental (unintentional), initial encounter: T50.901A

## 2017-04-24 LAB — CBC
HCT: 38.8 % (ref 36.0–46.0)
Hemoglobin: 13.6 g/dL (ref 12.0–15.0)
MCH: 31.4 pg (ref 26.0–34.0)
MCHC: 35.1 g/dL (ref 30.0–36.0)
MCV: 89.6 fL (ref 78.0–100.0)
Platelets: 270 10*3/uL (ref 150–400)
RBC: 4.33 MIL/uL (ref 3.87–5.11)
RDW: 12.7 % (ref 11.5–15.5)
WBC: 9.8 10*3/uL (ref 4.0–10.5)

## 2017-04-24 LAB — COMPREHENSIVE METABOLIC PANEL
ALT: 18 U/L (ref 14–54)
AST: 22 U/L (ref 15–41)
Albumin: 4.1 g/dL (ref 3.5–5.0)
Alkaline Phosphatase: 78 U/L (ref 38–126)
Anion gap: 12 (ref 5–15)
BUN: 11 mg/dL (ref 6–20)
CHLORIDE: 100 mmol/L — AB (ref 101–111)
CO2: 25 mmol/L (ref 22–32)
CREATININE: 0.92 mg/dL (ref 0.44–1.00)
Calcium: 9.2 mg/dL (ref 8.9–10.3)
Glucose, Bld: 157 mg/dL — ABNORMAL HIGH (ref 65–99)
Potassium: 3 mmol/L — ABNORMAL LOW (ref 3.5–5.1)
Sodium: 137 mmol/L (ref 135–145)
Total Bilirubin: 0.3 mg/dL (ref 0.3–1.2)
Total Protein: 7.3 g/dL (ref 6.5–8.1)

## 2017-04-24 LAB — ACETAMINOPHEN LEVEL: Acetaminophen (Tylenol), Serum: <10 ug/mL — ABNORMAL LOW (ref 10–30)

## 2017-04-24 LAB — CBG MONITORING, ED: Glucose-Capillary: 199 mg/dL — ABNORMAL HIGH (ref 65–99)

## 2017-04-24 LAB — SALICYLATE LEVEL

## 2017-04-24 LAB — ETHANOL

## 2017-04-24 MED ORDER — ONDANSETRON HCL 4 MG/2ML IJ SOLN: 4.0000 mg | Freq: Once | INTRAMUSCULAR | Status: DC

## 2017-04-24 MED ORDER — SODIUM CHLORIDE 0.9 % IV BOLUS (SEPSIS)
1000.0000 mL | Freq: Once | INTRAVENOUS | Status: AC
  Administered 2017-04-25: 1000 mL via INTRAVENOUS

## 2017-04-24 MED ORDER — ONDANSETRON 4 MG PO TBDP
4.0000 mg | ORAL_TABLET | Freq: Once | ORAL | Status: AC
  Administered 2017-04-24: 4 mg via ORAL
  Filled 2017-04-24: qty 1

## 2017-04-24 MED ORDER — SODIUM CHLORIDE 0.9 % IV BOLUS (SEPSIS)
1000.0000 mL | Freq: Once | INTRAVENOUS | Status: AC
  Administered 2017-04-24: 1000 mL via INTRAVENOUS

## 2017-04-24 NOTE — ED Provider Notes (Signed)
[facility] DEPT Provider Note   CSN: [id number] Arrival date & time: 04/24/17  1821     History   Chief Complaint Chief Complaint  Patient presents with  . Suicidal  . Drug Overdose    HPI Audrey Meyer is a 61 y.o. female.  HPI 61 year old female with a history of schizoaffective affective disorder, depression, past suicide attempts by overdosing on pills presents to the ED after a suicide attempt by overdosing on 6000 mg of trazodone 3 hours prior to arrival. Per EMS and [employer] they found an empty trazodone bottle that was filled on May 11 containing 30 pills of 300 mg tablets of trazodone. Patient states that she also took hydroxyzine however they noted that those pill bottles were full and closed. Patient denied any homicidal ideations, auditory/visual hallucinations.   Past Medical History:  Diagnosis Date  . Acid reflux   . Breast cancer ([place])   . COPD (chronic obstructive pulmonary disease) ([place])   . High cholesterol   . Hypertension   . Hypothyroid   . Schizoaffective disorder     Patient Active Problem List   Diagnosis Date Noted  . Hyponatremia 01/31/2017  . Hypomagnesemia 01/31/2017  . Major depressive disorder, recurrent episode, moderate ([place]) 04/28/2016  . Chronic obstructive pulmonary disease ([place])   . Hypokalemia 03/07/2016  . Hypothyroidism 03/07/2016  . Schizoaffective disorder, depressive type ([place]) 03/07/2016  . Essential hypertension 03/07/2016  . Tobacco abuse 03/07/2016    Past Surgical History:  Procedure Laterality Date  . APPENDECTOMY    . MASTECTOMY, PARTIAL    . TUBAL LIGATION      OB History    No data available       Home Medications    Prior to Admission medications   Medication Sig Start Date End Date Taking? Authorizing Provider  acetaminophen (TYLENOL) 500 MG tablet Take 1,000 mg by mouth every 6 (six) hours as needed for mild pain, moderate pain, fever or headache.   Yes [provider]  albuterol  (PROVENTIL HFA;VENTOLIN HFA) 108 (90 Base) MCG/ACT inhaler Inhale 2 puffs into the lungs every 6 (six) hours as needed for wheezing or shortness of breath.   Yes [provider]  amLODipine (NORVASC) 10 MG tablet Take 10 mg by mouth daily.   Yes [provider]  atorvastatin (LIPITOR) 20 MG tablet Take 1 tablet (20 mg total) by mouth at bedtime. 05/05/16  Yes Withrow, Elyse Jarvis, FNP  diazepam (VALIUM) 5 MG tablet Take 1 tablet (5 mg total) by mouth at bedtime as needed for anxiety. 02/04/17  Yes Hedges, Dellis Filbert, PA-C  furosemide (LASIX) 20 MG tablet Take 20 mg by mouth daily.    Yes [provider]  gabapentin (NEURONTIN) 300 MG capsule Take 300 mg by mouth 3 (three) times daily.    Yes [provider]  hydrOXYzine (VISTARIL) 25 MG capsule Take 1 capsule (25 mg total) by mouth 3 (three) times daily as needed for anxiety. 05/05/16  Yes Withrow, Elyse Jarvis, FNP  levothyroxine (SYNTHROID, LEVOTHROID) 125 MCG tablet Take 125 mcg by mouth daily before breakfast.    Yes [provider]  lisinopril (PRINIVIL,ZESTRIL) 10 MG tablet Take 10 mg by mouth daily.   Yes [provider]  lurasidone (LATUDA) 20 MG TABS tablet Take 20 mg by mouth at bedtime.   Yes [provider]  magnesium oxide (MAG-OX) 400 MG tablet Take 2 tablets (800 mg total) by mouth 2 (two) times daily. 02/03/17  Yes Alfonzo Beers, MD  Multiple Vitamin (MULTIVITAMIN WITH MINERALS) TABS tablet Take 1 tablet by mouth daily.   Yes [provider]  ondansetron (ZOFRAN ODT) 4 MG disintegrating tablet Take 1 tablet (4 mg total) by mouth every 8 (eight) hours as needed for nausea or vomiting. 02/05/17  Yes Leo Grosser, MD  ondansetron (ZOFRAN) 4 MG tablet Take 4 mg by mouth every 6 (six) hours as needed for nausea or vomiting.   Yes [provider]  potassium chloride SA (K-DUR,KLOR-CON) 20 MEQ tablet Take 20 mEq by mouth 2 (two) times daily.    Yes [provider]    promethazine (PHENERGAN) 25 MG tablet Take 25 mg by mouth every 6 (six) hours as needed for nausea or vomiting.    Yes [provider]  rOPINIRole (REQUIP) 1 MG tablet Take 1 mg by mouth at bedtime.   Yes [provider]  sertraline (ZOLOFT) 25 MG tablet Take 75 mg by mouth daily with breakfast.    Yes [provider]  thiothixene (NAVANE) 2 MG capsule Take 2 mg by mouth 3 (three) times daily.   Yes [provider]  trazodone (DESYREL) 300 MG tablet Take 300 mg by mouth at bedtime as needed for sleep.   Yes [provider]  zolpidem (AMBIEN) 10 MG tablet Take 10 mg by mouth at bedtime as needed for sleep.   Yes [provider]    Family History Family History  Problem Relation Age of Onset  . Heart attack Father   . Hypertension Father   . Cancer Other     Social History Social History  Substance Use Topics  . Smoking status: Current Every Day Smoker    Packs/day: 0.00    Years: 2.00    Types: Cigars  . Smokeless tobacco: Never Used  . Alcohol use No     Allergies   Budesonide and Promethazine   Review of Systems Review of Systems All other systems are reviewed and are negative for acute change except as noted in the HPI   Physical Exam Updated Vital Signs BP 126/77 (BP Location: Right Arm)   Pulse 80   Temp 97.9 F (36.6 C) (Oral)   Resp 14   Ht 5\' 2"  (1.575 m)   Wt 56.2 kg (124 lb)   SpO2 92%   BMI 22.68 kg/m   Physical Exam  Constitutional: She is oriented to person, place, and time. She appears well-developed and well-nourished. No distress.  HENT:  Head: Normocephalic and atraumatic.  Nose: Nose normal.  Eyes: Conjunctivae and EOM are normal. Pupils are equal, round, and reactive to light. Right eye exhibits no discharge. Left eye exhibits no discharge. No scleral icterus.  Neck: Normal range of motion. Neck supple.  Cardiovascular: Normal rate and regular rhythm.  Exam reveals no gallop and no  friction rub.   No murmur heard. Pulmonary/Chest: Effort normal and breath sounds normal. No stridor. No respiratory distress. She has no rales.  Abdominal: Soft. She exhibits no distension. There is no tenderness.  Musculoskeletal: She exhibits no edema or tenderness.  Neurological: She is alert and oriented to person, place, and time.  Skin: Skin is warm and dry. No rash noted. She is not diaphoretic. No erythema.  Psychiatric: Her speech is delayed. She is not actively hallucinating. She exhibits a depressed mood. She expresses suicidal ideation.  Vitals reviewed.    ED Treatments / Results  Labs (all labs ordered are listed, but only abnormal results are displayed) Labs Reviewed  COMPREHENSIVE  METABOLIC PANEL - Abnormal; Notable for the following:       Result Value   Potassium 3.0 (*)    Chloride 100 (*)    Glucose, Bld 157 (*)    All other components within normal limits  ACETAMINOPHEN LEVEL - Abnormal; Notable for the following:    Acetaminophen (Tylenol), Serum <10 (*)    All other components within normal limits  CBG MONITORING, ED - Abnormal; Notable for the following:    Glucose-Capillary 199 (*)    All other components within normal limits  ETHANOL  SALICYLATE LEVEL  CBC  RAPID URINE DRUG SCREEN, HOSP PERFORMED  CBG MONITORING, ED    EKG  EKG Interpretation  Date/Time:  Monday Apr 24 2017 18:31:18 EDT Ventricular Rate:  80 PR Interval:    QRS Duration: 98 QT Interval:  417 QTC Calculation: 482 R Axis:   93 Text Interpretation:  Sinus rhythm Atrial premature complex Right axis deviation Low voltage, precordial leads Nonspecific T abnormalities, lateral leads Baseline wander in lead(s) V1 No STEMI   Otherwise no significant change Confirmed by [facility] MD, PEDRO ([id number]) on 04/24/2017 6:38:52 PM       Radiology No results found.  Procedures Procedures (including critical care time) CRITICAL CARE Performed by: Grayce Sessions Cardama Total critical care  time: 40 minutes Critical care time was exclusive of separately billable procedures and treating other patients. Critical care was necessary to treat or prevent imminent or life-threatening deterioration. Critical care was time spent personally by me on the following activities: development of treatment plan with patient and/or surrogate as well as nursing, discussions with consultants, evaluation of patient's response to treatment, examination of patient, obtaining history from patient or surrogate, ordering and performing treatments and interventions, ordering and review of laboratory studies, ordering and review of radiographic studies, pulse oximetry and re-evaluation of patient's condition.   Medications Ordered in ED Medications  ondansetron (ZOFRAN-ODT) disintegrating tablet 4 mg (4 mg Oral Given 04/24/17 2104)  sodium chloride 0.9 % bolus 1,000 mL (0 mLs Intravenous Stopped 04/24/17 2236)     Initial Impression / Assessment and Plan / ED Course  I have reviewed the triage vital signs and the nursing notes.  Pertinent labs & imaging results that were available during my care of the patient were reviewed by me and considered in my medical decision making (see chart for details).     Present control contacted who recommended a 6 hour surveillance. Monitoring for bradycardia, QT prolongation, hypotension.  Patient is alert, oriented, appears to be depressed however is not somnolent, sleepy, lethargic. She is hemodynamically stable. EKG without evidence of QT prolongation, bradycardia. Coingestants and labs obtained. We'll monitor for additional 4 hours. If patient remains stable she should be medically cleared for behavioral health evaluation and management.  Patient was IVC'd by myself as she is a risk to herself.  Vitals:   04/24/17 2215 04/24/17 2230 04/24/17 2303 04/24/17 2328  BP:   (!) 88/63 97/60  Pulse:   70 71  Resp: 16 14 19 16   Temp:      TempSrc:      SpO2: 94% 92% 100%  99%  Weight:      Height:       Thursday patient's blood pressures were trending down requiring IV fluid hydration. Pressures did respond to IV fluids however trend back down with fluids were completed.  Repeat EKG with without bradycardia. QT/QTc improving from initial EKG.  Patient will require admission for continued management. She is  already IVCD and will require psychiatric evaluation during her admission.  Final Clinical Impressions(s) / ED Diagnoses   Final diagnoses:  Suicide attempt [facility])  Intentional drug overdose, initial encounter ([place])  Hypotension due to drugs     Cardama, Grayce Sessions, MD 04/24/17 2345

## 2017-04-24 NOTE — ED Notes (Signed)
Emesis x1. Will make MD aware.

## 2017-04-24 NOTE — ED Notes (Signed)
Pt has in belonging bag:  Pink shirt, black pants, black bra, black and pink phone, glasses.

## 2017-04-24 NOTE — ED Notes (Signed)
Gave update to poison control

## 2017-04-24 NOTE — ED Notes (Signed)
Made MD aware of soft pressure

## 2017-04-24 NOTE — ED Triage Notes (Signed)
Per EMS, pt overdosed on 13 -50mg  trazadone at 4:30pm. Hx of same. Emesis x 2 in route.  Pt is alert and oriented. 18g LAC.  cbg 190, 150/68, hr 84, NSR, 98% ra

## 2017-04-24 NOTE — ED Notes (Signed)
Bed: WA14 Expected date:  Expected time:  Means of arrival:  Comments: Res B 

## 2017-04-24 NOTE — ED Triage Notes (Signed)
Per Poison Control [place]):  Watch for GI Upset, Drowsiness, Bradycardia, Hypotension, Seizures.  Fluids for hypotension, benzos for seizures  Recommends EKG (QTC prolongation possible) If prolonged, check magnesium and potassium and replace as needed. Get acetaminophen and salicylate.  6 hour observation if asymptomatic with normal vitals

## 2017-04-24 NOTE — ED Notes (Signed)
Bed: RESB Expected date:  Expected time:  Means of arrival:  Comments: EMS overdose 

## 2017-04-25 ENCOUNTER — Encounter (HOSPITAL_COMMUNITY): Payer: Self-pay | Admitting: Internal Medicine

## 2017-04-25 DIAGNOSIS — T43212A Poisoning by selective serotonin and norepinephrine reuptake inhibitors, intentional self-harm, initial encounter: Principal | ICD-10-CM

## 2017-04-25 DIAGNOSIS — I952 Hypotension due to drugs: Secondary | ICD-10-CM

## 2017-04-25 DIAGNOSIS — T1491XA Suicide attempt, initial encounter: Secondary | ICD-10-CM

## 2017-04-25 DIAGNOSIS — J449 Chronic obstructive pulmonary disease, unspecified: Secondary | ICD-10-CM

## 2017-04-25 DIAGNOSIS — I959 Hypotension, unspecified: Secondary | ICD-10-CM

## 2017-04-25 DIAGNOSIS — F329 Major depressive disorder, single episode, unspecified: Secondary | ICD-10-CM

## 2017-04-25 DIAGNOSIS — F251 Schizoaffective disorder, depressive type: Secondary | ICD-10-CM

## 2017-04-25 DIAGNOSIS — I10 Essential (primary) hypertension: Secondary | ICD-10-CM

## 2017-04-25 HISTORY — DX: Suicide attempt, initial encounter: T14.91XA

## 2017-04-25 LAB — RAPID URINE DRUG SCREEN, HOSP PERFORMED
Amphetamines: NOT DETECTED
BARBITURATES: NOT DETECTED
Benzodiazepines: NOT DETECTED
Cocaine: NOT DETECTED
Opiates: NOT DETECTED
TETRAHYDROCANNABINOL: NOT DETECTED

## 2017-04-25 LAB — GLUCOSE, CAPILLARY
GLUCOSE-CAPILLARY: 106 mg/dL — AB (ref 65–99)
GLUCOSE-CAPILLARY: 89 mg/dL (ref 65–99)
GLUCOSE-CAPILLARY: 96 mg/dL (ref 65–99)

## 2017-04-25 LAB — HEPATIC FUNCTION PANEL
ALT: 15 U/L (ref 14–54)
AST: 19 U/L (ref 15–41)
Albumin: 3.2 g/dL — ABNORMAL LOW (ref 3.5–5.0)
Alkaline Phosphatase: 62 U/L (ref 38–126)
BILIRUBIN TOTAL: 0.4 mg/dL (ref 0.3–1.2)
Total Protein: 5.6 g/dL — ABNORMAL LOW (ref 6.5–8.1)

## 2017-04-25 LAB — BASIC METABOLIC PANEL
Anion gap: 4 — ABNORMAL LOW (ref 5–15)
BUN: 9 mg/dL (ref 6–20)
CHLORIDE: 110 mmol/L (ref 101–111)
CO2: 26 mmol/L (ref 22–32)
CREATININE: 0.79 mg/dL (ref 0.44–1.00)
Calcium: 8 mg/dL — ABNORMAL LOW (ref 8.9–10.3)
GFR calc Af Amer: >60 mL/min (ref >60)
GFR calc non Af Amer: >60 mL/min (ref >60)
GLUCOSE: 102 mg/dL — AB (ref 65–99)
POTASSIUM: 3.9 mmol/L (ref 3.5–5.1)
Sodium: 140 mmol/L (ref 135–145)

## 2017-04-25 LAB — CBC
HCT: 34.4 % — ABNORMAL LOW (ref 36.0–46.0)
Hemoglobin: 11.6 g/dL — ABNORMAL LOW (ref 12.0–15.0)
MCH: 30.4 pg (ref 26.0–34.0)
MCHC: 33.7 g/dL (ref 30.0–36.0)
MCV: 90.1 fL (ref 78.0–100.0)
PLATELETS: 245 10*3/uL (ref 150–400)
RBC: 3.82 MIL/uL — ABNORMAL LOW (ref 3.87–5.11)
RDW: 12.6 % (ref 11.5–15.5)
WBC: 9.9 10*3/uL (ref 4.0–10.5)

## 2017-04-25 LAB — MRSA PCR SCREENING: MRSA BY PCR: NEGATIVE

## 2017-04-25 LAB — MAGNESIUM: Magnesium: 1.7 mg/dL (ref 1.7–2.4)

## 2017-04-25 MED ORDER — NICOTINE 14 MG/24HR TD PT24
14.0000 mg | MEDICATED_PATCH | Freq: Every day | TRANSDERMAL | Status: DC
  Administered 2017-04-25 – 2017-04-26 (×2): 14 mg via TRANSDERMAL
  Filled 2017-04-25 (×2): qty 1

## 2017-04-25 MED ORDER — MAGNESIUM OXIDE 400 (241.3 MG) MG PO TABS
800.0000 mg | ORAL_TABLET | Freq: Two times a day (BID) | ORAL | Status: DC
  Administered 2017-04-25 – 2017-04-26 (×4): 800 mg via ORAL
  Filled 2017-04-25 (×5): qty 2

## 2017-04-25 MED ORDER — ONDANSETRON HCL 4 MG/2ML IJ SOLN
4.0000 mg | Freq: Four times a day (QID) | INTRAMUSCULAR | Status: DC | PRN
  Administered 2017-04-25: 4 mg via INTRAVENOUS
  Filled 2017-04-25: qty 2

## 2017-04-25 MED ORDER — ALBUTEROL SULFATE HFA 108 (90 BASE) MCG/ACT IN AERS: 2.0000 | INHALATION_SPRAY | Freq: Four times a day (QID) | RESPIRATORY_TRACT | Status: DC | PRN

## 2017-04-25 MED ORDER — ATORVASTATIN CALCIUM 10 MG PO TABS
20.0000 mg | ORAL_TABLET | Freq: Every day | ORAL | Status: DC
  Administered 2017-04-25: 20 mg via ORAL
  Filled 2017-04-25 (×2): qty 2

## 2017-04-25 MED ORDER — ORAL CARE MOUTH RINSE
15.0000 mL | Freq: Two times a day (BID) | OROMUCOSAL | Status: DC
  Administered 2017-04-25 – 2017-04-26 (×2): 15 mL via OROMUCOSAL

## 2017-04-25 MED ORDER — SODIUM CHLORIDE 0.9 % IV SOLN
INTRAVENOUS | Status: AC
  Administered 2017-04-25 – 2017-04-26 (×2): via INTRAVENOUS

## 2017-04-25 MED ORDER — ALBUTEROL SULFATE (2.5 MG/3ML) 0.083% IN NEBU: 2.5000 mg | INHALATION_SOLUTION | Freq: Four times a day (QID) | RESPIRATORY_TRACT | Status: DC | PRN

## 2017-04-25 MED ORDER — SODIUM CHLORIDE 0.9 % IV BOLUS (SEPSIS)
1000.0000 mL | Freq: Once | INTRAVENOUS | Status: AC
  Administered 2017-04-25: 1000 mL via INTRAVENOUS

## 2017-04-25 MED ORDER — POTASSIUM CHLORIDE 10 MEQ/100ML IV SOLN
10.0000 meq | INTRAVENOUS | Status: AC
  Administered 2017-04-25 (×2): 10 meq via INTRAVENOUS
  Filled 2017-04-25 (×2): qty 100

## 2017-04-25 MED ORDER — LEVOTHYROXINE SODIUM 125 MCG PO TABS
125.0000 ug | ORAL_TABLET | Freq: Every day | ORAL | Status: DC
  Administered 2017-04-25 – 2017-04-26 (×2): 125 ug via ORAL
  Filled 2017-04-25 (×2): qty 1

## 2017-04-25 MED ORDER — POTASSIUM CHLORIDE CRYS ER 20 MEQ PO TBCR
20.0000 meq | EXTENDED_RELEASE_TABLET | Freq: Two times a day (BID) | ORAL | Status: DC
  Administered 2017-04-25 – 2017-04-26 (×4): 20 meq via ORAL
  Filled 2017-04-25 (×4): qty 1

## 2017-04-25 NOTE — Clinical Social Work Psych Note (Signed)
Clinical Social Worker Psych Service Line Progress Note  Clinical Social Worker: Lia Hopping, LCSW Date/Time: 04/25/2017, 4:11 PM   Review of Patient  Overall Medical Condition:  Currently in Intensive Care Unit  Participation Level:  Minimal Participation Quality: Appropriate Other Participation Quality:  Calm, Cooperative   Affect: Flat Cognitive: Appropriate Reaction to Medications/Concerns:  No concerns with medications.    Modes of Intervention: Support, Exploration   Summary of Progress/Plan at Discharge  Summary of Progress/Plan at Discharge: CSW met with patient at bedside, explain role and reason for csw intervention. Patient spoke minimal during conversation. Patient reports she took an overdose of Trazadone as suicide attempt and later called her ex-husband and then he called EMS. Patient reports she has been very depressed because "they have not forgiven me." Patient did not go into detail about the statement. Patient denies auditory or visual hallucinations. Patient reports she lives in the home with her ex-husband and her two adult children live in [place].  The patient reports she was recently hospitalize for her depression. The patient has a significant history of behavioral health admissions.   Psychiatrist recommends inpatient psychiatirc placement. Patient is agreeable at this time.  Per Psychiatrist if patient is not agreeable she should placed under IVC.  CSW will continue to assist with placement once pt. Is medically stable.   Kathrin Greathouse, Latanya Presser, MSW Clinical Social Worker 5E and Psychiatric Service Line [phone] 04/25/2017  4:21 PM

## 2017-04-25 NOTE — Progress Notes (Signed)
[facility]   Patient admitted after midnight, see H&P for further details I have reviewed the chart, seen and examined this patient.   61 y/o F with PMHx of COPD, HTN and depression brought to the ED via EMS after ingesting 20 tablets of 300 mg trazadone with intention of killing her self. Patient is admitted for drug overdose and suicidal ideas. Patient is being monitored in the SDU for hypotension and QT prolongation.   Patient report feeling ok, but very depressed. 3rd suicidal attempt. A lot of issues at home.   VS  BP (!) 97/47   Pulse (!) 59   Temp 99.4 F (37.4 C)   Resp 18   Ht 5\' 2"  (1.575 m)   Wt 58.1 kg (128 lb 1.4 oz)   SpO2 95%   BMI 23.43 kg/m   Physical exam unremarkable - sleeping in bed, mood very depressed   Impression  Intentional drug overdose with suicidal ideas  Severe Depression  Hypotension 2/2 to trazadone overdose  [place] QTc 468 Hx of HTN  COPD   Plan  Continue to monitor on SDU  Psych consulted  SW consult  Will give another NS bolus and continue at 125 cc/hr - monitor for signs of fluid overload Check EKG every 6 hrs  Hold HTN medications  If hypotension persist will consider vasopressors   Time spent: 25 minutes   Chipper Oman, MD

## 2017-04-25 NOTE — H&P (Signed)
History and Physical    Audrey Meyer OVF:[id number] DOB: October 21, 1956 DOA: 04/24/2017  PCP: Anda Kraft, MD  Patient coming from: Home.  Chief Complaint: Drug overdose. Suicidal ideation.  HPI: Audrey Meyer is a 61 y.o. female with hypertension, depression, COPD was brought to the ER after patient called EMS after consuming 6000 mg of trazodone. Patient states that around 4 PM yesterday patient felt very depressed and took all her supply of trazodone. There also was concern that patient may have taken Phenergan but all the tablets were intact. Patient denies taking any other tablets. Patient is presently living with her ex-husband. Patient notified her ex-husband and her ex-husband called the EMS.   ED Course: In the ER patient is found to be mildly drowsy. EKG was showing normal sinus rhythm with QTC of 482 ms. As per the poison control patient needs to be monitored for any hypotension or prolongation of QTC. Patient did become hypotensive in the ER and was given fluid bolus and admitted for further observation.  Review of Systems: As per HPI, rest all negative.   Past Medical History:  Diagnosis Date  . Acid reflux   . Breast cancer ([place])   . COPD (chronic obstructive pulmonary disease) ([place])   . High cholesterol   . Hypertension   . Hypothyroid   . Schizoaffective disorder     Past Surgical History:  Procedure Laterality Date  . APPENDECTOMY    . MASTECTOMY, PARTIAL    . TUBAL LIGATION       reports that she has been smoking Cigars.  She has been smoking about 0.00 packs per day for the past 2.00 years. She has never used smokeless tobacco. She reports that she does not drink alcohol or use drugs.  Allergies  Allergen Reactions  . Budesonide Shortness Of Breath  . Promethazine Nausea And Vomiting    Family History  Problem Relation Age of Onset  . Heart attack Father   . Hypertension Father   . Cancer Other     Prior to Admission medications   Medication  Sig Start Date End Date Taking? Authorizing Provider  acetaminophen (TYLENOL) 500 MG tablet Take 1,000 mg by mouth every 6 (six) hours as needed for mild pain, moderate pain, fever or headache.   Yes [provider]  albuterol (PROVENTIL HFA;VENTOLIN HFA) 108 (90 Base) MCG/ACT inhaler Inhale 2 puffs into the lungs every 6 (six) hours as needed for wheezing or shortness of breath.   Yes [provider]  amLODipine (NORVASC) 10 MG tablet Take 10 mg by mouth daily.   Yes [provider]  atorvastatin (LIPITOR) 20 MG tablet Take 1 tablet (20 mg total) by mouth at bedtime. 05/05/16  Yes Withrow, Elyse Jarvis, FNP  diazepam (VALIUM) 5 MG tablet Take 1 tablet (5 mg total) by mouth at bedtime as needed for anxiety. 02/04/17  Yes Hedges, Dellis Filbert, PA-C  furosemide (LASIX) 20 MG tablet Take 20 mg by mouth daily.    Yes [provider]  gabapentin (NEURONTIN) 300 MG capsule Take 300 mg by mouth 3 (three) times daily.    Yes [provider]  hydrOXYzine (VISTARIL) 25 MG capsule Take 1 capsule (25 mg total) by mouth 3 (three) times daily as needed for anxiety. 05/05/16  Yes Withrow, Elyse Jarvis, FNP  levothyroxine (SYNTHROID, LEVOTHROID) 125 MCG tablet Take 125 mcg by mouth daily before breakfast.    Yes [provider]  lisinopril (PRINIVIL,ZESTRIL) 10 MG tablet Take 10 mg by  mouth daily.   Yes [provider]  lurasidone (LATUDA) 20 MG TABS tablet Take 20 mg by mouth at bedtime.   Yes [provider]  magnesium oxide (MAG-OX) 400 MG tablet Take 2 tablets (800 mg total) by mouth 2 (two) times daily. 02/03/17  Yes Alfonzo Beers, MD  Multiple Vitamin (MULTIVITAMIN WITH MINERALS) TABS tablet Take 1 tablet by mouth daily.   Yes [provider]  ondansetron (ZOFRAN ODT) 4 MG disintegrating tablet Take 1 tablet (4 mg total) by mouth every 8 (eight) hours as needed for nausea or vomiting. 02/05/17  Yes Leo Grosser, MD  ondansetron (ZOFRAN) 4 MG tablet Take 4  mg by mouth every 6 (six) hours as needed for nausea or vomiting.   Yes [provider]  potassium chloride SA (K-DUR,KLOR-CON) 20 MEQ tablet Take 20 mEq by mouth 2 (two) times daily.    Yes [provider]  promethazine (PHENERGAN) 25 MG tablet Take 25 mg by mouth every 6 (six) hours as needed for nausea or vomiting.    Yes [provider]  rOPINIRole (REQUIP) 1 MG tablet Take 1 mg by mouth at bedtime.   Yes [provider]  sertraline (ZOLOFT) 25 MG tablet Take 75 mg by mouth daily with breakfast.    Yes [provider]  thiothixene (NAVANE) 2 MG capsule Take 2 mg by mouth 3 (three) times daily.   Yes [provider]  trazodone (DESYREL) 300 MG tablet Take 300 mg by mouth at bedtime as needed for sleep.   Yes [provider]  zolpidem (AMBIEN) 10 MG tablet Take 10 mg by mouth at bedtime as needed for sleep.   Yes [provider]    Physical Exam: Vitals:   04/25/17 0030 04/25/17 0045 04/25/17 0111 04/25/17 0114  BP: (!) 91/48  (!) 114/53   Pulse:   89   Resp: 19 20 18  (!) 24  Temp:      TempSrc:      SpO2:  100% (!) 89% 95%  Weight:   58.1 kg (128 lb 1.4 oz)   Height:   5\' 2"  (1.575 m)       Constitutional: Moderately built and nourished. Vitals:   04/25/17 0030 04/25/17 0045 04/25/17 0111 04/25/17 0114  BP: (!) 91/48  (!) 114/53   Pulse:   89   Resp: 19 20 18  (!) 24  Temp:      TempSrc:      SpO2:  100% (!) 89% 95%  Weight:   58.1 kg (128 lb 1.4 oz)   Height:   5\' 2"  (1.575 m)    Eyes: Anicteric mild pallor. ENMT: No discharge from the ears eyes nose and mouth. Neck: No mass felt. No neck rigidity. Respiratory: No rhonchi or crepitations. Cardiovascular: S1-S2 heard no murmurs appreciated. Abdomen: Soft nontender bowel sounds present. Musculoskeletal: No edema. No joint effusion. Skin: No rash. Skin appears warm. Neurologic: Alert awake oriented to time place and person. Moves all  extremities. Psychiatric: Patient is suicidal and depressed.   Labs on Admission: I have personally reviewed following labs and imaging studies  CBC:  Recent Labs Lab 04/24/17 1834  WBC 9.8  HGB 13.6  HCT 38.8  MCV 89.6  PLT 256   Basic Metabolic Panel:  Recent Labs Lab 04/24/17 1834  NA 137  K 3.0*  CL 100*  CO2 25  GLUCOSE 157*  BUN 11  CREATININE 0.92  CALCIUM 9.2   GFR: Estimated Creatinine Clearance: 49.5 mL/min (  by C-G formula based on SCr of 0.92 mg/dL). Liver Function Tests:  Recent Labs Lab 04/24/17 1834  AST 22  ALT 18  ALKPHOS 78  BILITOT 0.3  PROT 7.3  ALBUMIN 4.1   No results for input(s): LIPASE, AMYLASE in the last 168 hours. No results for input(s): AMMONIA in the last 168 hours. Coagulation Profile: No results for input(s): INR, PROTIME in the last 168 hours. Cardiac Enzymes: No results for input(s): CKTOTAL, CKMB, CKMBINDEX, TROPONINI in the last 168 hours. BNP (last 3 results) No results for input(s): PROBNP in the last 8760 hours. HbA1C: No results for input(s): HGBA1C in the last 72 hours. CBG:  Recent Labs Lab 04/24/17 1830  GLUCAP 199*   Lipid Profile: No results for input(s): CHOL, HDL, LDLCALC, TRIG, CHOLHDL, LDLDIRECT in the last 72 hours. Thyroid Function Tests: No results for input(s): TSH, T4TOTAL, FREET4, T3FREE, THYROIDAB in the last 72 hours. Anemia Panel: No results for input(s): VITAMINB12, FOLATE, FERRITIN, TIBC, IRON, RETICCTPCT in the last 72 hours. Urine analysis:    Component Value Date/Time   COLORURINE COLORLESS (A) 02/07/2017 1100   APPEARANCEUR CLEAR 02/07/2017 1100   LABSPEC 1.003 (L) 02/07/2017 1100   PHURINE 7.0 02/07/2017 1100   GLUCOSEU NEGATIVE 02/07/2017 1100   HGBUR SMALL (A) 02/07/2017 1100   BILIRUBINUR NEGATIVE 02/07/2017 1100   KETONESUR 5 (A) 02/07/2017 1100   PROTEINUR NEGATIVE 02/07/2017 1100   UROBILINOGEN 0.2 09/19/2012 2334   NITRITE NEGATIVE 02/07/2017 1100   LEUKOCYTESUR  NEGATIVE 02/07/2017 1100   Sepsis Labs: @LABRCNTIP (procalcitonin:4,lacticidven:4) )No results found for this or any previous visit (from the past 240 hour(s)).   Radiological Exams on Admission: No results found.  EKG: Independently reviewed. Normal sinus rhythm with QTC of 482 ms and QRS of 96 ms.  Assessment/Plan Principal Problem:   Drug overdose Active Problems:   Hypothyroidism   Schizoaffective disorder, depressive type ([place])   Essential hypertension   Chronic obstructive pulmonary disease ([place])   Suicide attempt ([place])   Hypotension due to drugs    1. Intentional drug overdose event trazodone - patient consumed 6000 mg of trazodone. As per the poison control to check EKG every 6 hours for the next 24 hours to make sure there is no prolongation of QTC. If QTC is prolonged more than 400 ms and will need to correct patient's potassium and magnesium. Also to watch out for hypotension which needs to be corrected with fluids. If hypotension is persistent then may have to start vasopressors. Patient did become hypotensive for which fluids and been started and antihypertensives on hold. 2. History of depression with suicidal thoughts - on suicide precautions. Regarding antidepressants due to overdose. 3. Hypertension - holding off antihypertensives due to hypotension. 4. COPD - no active wheezing.   DVT prophylaxis: Lovenox. Code Status: Full code.  Family Communication: Discussed with patient.  Disposition Plan: To be determined.  Consults called: None.  Admission status: Inpatient.    Rise Patience MD [facility] Pager [phone].  If 7PM-7AM, please contact night-coverage [email or web address] Password [email or web address]  04/25/2017, 1:28 AM

## 2017-04-25 NOTE — Care Management Note (Signed)
Case Management Note  Patient Details  Name: Audrey Meyer MRN: [id number] Date of Birth: Dec 08, 1955  Subjective/Objective:                  61 y.o. female with hypertension, depression, COPD was brought to the ER after patient called EMS after consuming 6000 mg of trazodone. Patient states that around 4 PM yesterday patient felt very depressed and took all her supply of trazodone. There also was concern that patient may have taken Phenergan but all the tablets were intact. Patient denies taking any other tablets. Patient is presently living with her ex-husband. Patient notified her ex-husband and her ex-husband called the EMS.   Action/Plan: Date:  Apr 25, 2017 Chart reviewed for concurrent status and case management needs. Will continue to follow patient progress. Discharge Planning: following for needs Expected discharge date: [id number] Velva Harman, BSN, [place], [place]  Expected Discharge Date:                  Expected Discharge Plan:  Home/Self Care  In-House Referral:  Clinical Social Work  Discharge planning Services  CM Consult  Post Acute Care Choice:    Choice offered to:     DME Arranged:    DME Agency:     HH Arranged:    [place] Agency:     Status of Service:  In process, will continue to follow  If discussed at Long Length of Stay Meetings, dates discussed:    Additional Comments:  Leeroy Cha, RN 04/25/2017, 8:46 AM

## 2017-04-25 NOTE — Consult Note (Signed)
Audrey Meyer   Reason for Meyer:  Suicide attempt Referring Physician:  Dr. Quincy Simmonds Patient Identification: Audrey Meyer MRN:  [id number] Principal Diagnosis: Drug overdose Diagnosis:   Patient Active Problem List   Diagnosis Date Noted  . Suicide attempt ([place]) [T14.91XA] 04/25/2017  . Hypotension due to drugs [I95.2] 04/25/2017  . Drug overdose [T50.901A] 04/24/2017  . Hyponatremia [E87.1] 01/31/2017  . Hypomagnesemia [E83.42] 01/31/2017  . Major depressive disorder, recurrent episode, moderate ([place]) [F33.1] 04/28/2016  . Chronic obstructive pulmonary disease ([place]) [J44.9]   . Hypokalemia [E87.6] 03/07/2016  . Hypothyroidism [E03.9] 03/07/2016  . Schizoaffective disorder, depressive type ([place]) [F25.1] 03/07/2016  . Essential hypertension [I10] 03/07/2016  . Tobacco abuse [Z72.0] 03/07/2016    Total Time spent with patient: 30 minutes  Subjective:   Audrey Meyer is a 61 y.o. female patient admitted with Suicide attempt by overdose on 20 tablets of trazodone 300 mg. Currently being monitored in SDU for QT prolongation and hypotension.    HPI:  I spent time discussing with patient at the bedside. She acknowledges and confirms that she took an overdose of trazodone in an attempt to end her life. She reports that she has been in a dark depression for many years, and has not had any relief with the many medication therapies that she has tried. She continues to have passive suicidal thoughts but reports that she feels like she can be safe at Hospital.  She denies any auditory hallucinations or visual hallucinations, or commanding thoughts. She reports that she has some social support from her ex-husband. She is on disability.  I asked the patient if she would be agreeable to psychiatric hospitalization and she is agreeable. I inquired if she would be open to considering ECT for treatment of her mood symptoms, given that she has been tried on multiple medications,  and his continued to suffer with severe symptoms. She would be agreeable to this, either as an inpatient or outpatient.  Past Psychiatric History: 3 prior psychiatric hospitalizations, 2 at behavioral health 1 at [facility]. Prior suicide attempts - 2 prior suicide attempts by overdose.  Risk to Self: Is patient at risk for suicide?: Yes Risk to Others:   Prior Inpatient Therapy:   Prior Outpatient Therapy:    Past Medical History:  Past Medical History:  Diagnosis Date  . Acid reflux   . Breast cancer ([place])   . COPD (chronic obstructive pulmonary disease) ([place])   . High cholesterol   . Hypertension   . Hypothyroid   . Schizoaffective disorder     Past Surgical History:  Procedure Laterality Date  . APPENDECTOMY    . MASTECTOMY, PARTIAL    . TUBAL LIGATION     Family History:  Family History  Problem Relation Age of Onset  . Heart attack Father   . Hypertension Father   . Cancer Other    Family Psychiatric  History: dementia Social History:  History  Alcohol Use No     History  Drug Use No    Social History   Social History  . Marital status: Legally Separated    Spouse name: N/A  . Number of children: N/A  . Years of education: N/A   Social History Main Topics  . Smoking status: Current Every Day Smoker    Packs/day: 0.00    Years: 2.00    Types: Cigars  . Smokeless tobacco: Never Used  . Alcohol use No  . Drug use: No  . Sexual activity:  No   Other Topics Concern  . None   Social History Narrative  . None   Additional Social History:    Allergies:   Allergies  Allergen Reactions  . Budesonide Shortness Of Breath  . Promethazine Nausea And Vomiting    Labs:  Results for orders placed or performed during the hospital encounter of 04/24/17 (from the past 48 hour(s))  CBG monitoring, ED     Status: Abnormal   Collection Time: 04/24/17  6:30 PM  Result Value Ref Range   Glucose-Capillary 199 (H) 65 - 99 mg/dL   Comment 1 Notify RN    Comment  2 Document in Chart   Comprehensive metabolic panel     Status: Abnormal   Collection Time: 04/24/17  6:34 PM  Result Value Ref Range   Sodium 137 135 - 145 mmol/L   Potassium 3.0 (L) 3.5 - 5.1 mmol/L   Chloride 100 (L) 101 - 111 mmol/L   CO2 25 22 - 32 mmol/L   Glucose, Bld 157 (H) 65 - 99 mg/dL   BUN 11 6 - 20 mg/dL   Creatinine, Ser 0.92 0.44 - 1.00 mg/dL   Calcium 9.2 8.9 - 10.3 mg/dL   Total Protein 7.3 6.5 - 8.1 g/dL   Albumin 4.1 3.5 - 5.0 g/dL   AST 22 15 - 41 U/L   ALT 18 14 - 54 U/L   Alkaline Phosphatase 78 38 - 126 U/L   Total Bilirubin 0.3 0.3 - 1.2 mg/dL   GFR calc non Af Amer >60 >60 mL/min   GFR calc Af Amer >60 >60 mL/min    Comment: (NOTE) The eGFR has been calculated using the CKD EPI equation. This calculation has not been validated in all clinical situations. eGFR's persistently <60 mL/min signify possible Chronic Kidney Disease.    Anion gap 12 5 - 15  Ethanol     Status: None   Collection Time: 04/24/17  6:34 PM  Result Value Ref Range   Alcohol, Ethyl (B) <5 <5 mg/dL    Comment:        LOWEST DETECTABLE LIMIT FOR SERUM ALCOHOL IS 5 mg/dL FOR MEDICAL PURPOSES ONLY   Salicylate level     Status: None   Collection Time: 04/24/17  6:34 PM  Result Value Ref Range   Salicylate Lvl <6.2 2.8 - 30.0 mg/dL  Acetaminophen level     Status: Abnormal   Collection Time: 04/24/17  6:34 PM  Result Value Ref Range   Acetaminophen (Tylenol), Serum <10 (L) 10 - 30 ug/mL    Comment:        THERAPEUTIC CONCENTRATIONS VARY SIGNIFICANTLY. A RANGE OF 10-30 ug/mL MAY BE AN EFFECTIVE CONCENTRATION FOR MANY PATIENTS. HOWEVER, SOME ARE BEST TREATED AT CONCENTRATIONS OUTSIDE THIS RANGE. ACETAMINOPHEN CONCENTRATIONS >150 ug/mL AT 4 HOURS AFTER INGESTION AND >50 ug/mL AT 12 HOURS AFTER INGESTION ARE OFTEN ASSOCIATED WITH TOXIC REACTIONS.   cbc     Status: None   Collection Time: 04/24/17  6:34 PM  Result Value Ref Range   WBC 9.8 4.0 - 10.5 K/uL   RBC 4.33 3.87  - 5.11 MIL/uL   Hemoglobin 13.6 12.0 - 15.0 g/dL   HCT 38.8 36.0 - 46.0 %   MCV 89.6 78.0 - 100.0 fL   MCH 31.4 26.0 - 34.0 pg   MCHC 35.1 30.0 - 36.0 g/dL   RDW 12.7 11.5 - 15.5 %   Platelets 270 150 - 400 K/uL  MRSA PCR Screening  Status: None   Collection Time: 04/25/17  1:27 AM  Result Value Ref Range   MRSA by PCR NEGATIVE NEGATIVE    Comment:        The GeneXpert MRSA Assay (FDA approved for NASAL specimens only), is one component of a comprehensive MRSA colonization surveillance program. It is not intended to diagnose MRSA infection nor to guide or monitor treatment for MRSA infections.   Magnesium     Status: None   Collection Time: 04/25/17  2:43 AM  Result Value Ref Range   Magnesium 1.7 1.7 - 2.4 mg/dL  Basic metabolic panel     Status: Abnormal   Collection Time: 04/25/17  2:43 AM  Result Value Ref Range   Sodium 140 135 - 145 mmol/L   Potassium 3.9 3.5 - 5.1 mmol/L    Comment: DELTA CHECK NOTED REPEATED TO VERIFY NO VISIBLE HEMOLYSIS    Chloride 110 101 - 111 mmol/L   CO2 26 22 - 32 mmol/L   Glucose, Bld 102 (H) 65 - 99 mg/dL   BUN 9 6 - 20 mg/dL   Creatinine, Ser 0.79 0.44 - 1.00 mg/dL   Calcium 8.0 (L) 8.9 - 10.3 mg/dL   GFR calc non Af Amer >60 >60 mL/min   GFR calc Af Amer >60 >60 mL/min    Comment: (NOTE) The eGFR has been calculated using the CKD EPI equation. This calculation has not been validated in all clinical situations. eGFR's persistently <60 mL/min signify possible Chronic Kidney Disease.    Anion gap 4 (L) 5 - 15  CBC     Status: Abnormal   Collection Time: 04/25/17  2:43 AM  Result Value Ref Range   WBC 9.9 4.0 - 10.5 K/uL   RBC 3.82 (L) 3.87 - 5.11 MIL/uL   Hemoglobin 11.6 (L) 12.0 - 15.0 g/dL   HCT 34.4 (L) 36.0 - 46.0 %   MCV 90.1 78.0 - 100.0 fL   MCH 30.4 26.0 - 34.0 pg   MCHC 33.7 30.0 - 36.0 g/dL   RDW 12.6 11.5 - 15.5 %   Platelets 245 150 - 400 K/uL  Hepatic function panel     Status: Abnormal   Collection Time:  04/25/17  2:43 AM  Result Value Ref Range   Total Protein 5.6 (L) 6.5 - 8.1 g/dL   Albumin 3.2 (L) 3.5 - 5.0 g/dL   AST 19 15 - 41 U/L   ALT 15 14 - 54 U/L   Alkaline Phosphatase 62 38 - 126 U/L   Total Bilirubin 0.4 0.3 - 1.2 mg/dL   Bilirubin, Direct <0.1 (L) 0.1 - 0.5 mg/dL   Indirect Bilirubin NOT CALCULATED 0.3 - 0.9 mg/dL  Rapid urine drug screen (hospital performed)     Status: None   Collection Time: 04/25/17  2:53 AM  Result Value Ref Range   Opiates NONE DETECTED NONE DETECTED   Cocaine NONE DETECTED NONE DETECTED   Benzodiazepines NONE DETECTED NONE DETECTED   Amphetamines NONE DETECTED NONE DETECTED   Tetrahydrocannabinol NONE DETECTED NONE DETECTED   Barbiturates NONE DETECTED NONE DETECTED    Comment:        DRUG SCREEN FOR MEDICAL PURPOSES ONLY.  IF CONFIRMATION IS NEEDED FOR ANY PURPOSE, NOTIFY LAB WITHIN 5 DAYS.        LOWEST DETECTABLE LIMITS FOR URINE DRUG SCREEN Drug Class       Cutoff (ng/mL) Amphetamine      1000 Barbiturate      200 Benzodiazepine  355 Tricyclics       974 Opiates          300 Cocaine          300 THC              50   Glucose, capillary     Status: None   Collection Time: 04/25/17  8:11 AM  Result Value Ref Range   Glucose-Capillary 96 65 - 99 mg/dL    Current Facility-Administered Medications  Medication Dose Route Frequency Provider Last Rate Last Dose  . 0.9 %  sodium chloride infusion   Intravenous Continuous Rise Patience, MD 125 mL/hr at 04/25/17 0800    . albuterol (PROVENTIL) (2.5 MG/3ML) 0.083% nebulizer solution 2.5 mg  2.5 mg Nebulization Q6H PRN Rise Patience, MD      . atorvastatin (LIPITOR) tablet 20 mg  20 mg Oral QHS Rise Patience, MD      . levothyroxine (SYNTHROID, LEVOTHROID) tablet 125 mcg  125 mcg Oral QAC breakfast Rise Patience, MD   125 mcg at 04/25/17 1638  . magnesium oxide (MAG-OX) tablet 800 mg  800 mg Oral BID Rise Patience, MD   800 mg at 04/25/17 1100  . MEDLINE  mouth rinse  15 mL Mouth Rinse BID Rise Patience, MD   15 mL at 04/25/17 1100  . potassium chloride SA (K-DUR,KLOR-CON) CR tablet 20 mEq  20 mEq Oral BID Rise Patience, MD   20 mEq at 04/25/17 1100    Musculoskeletal: Strength & Muscle Tone: decreased Gait & Station: unable to stand Patient leans: N/A  Psychiatric Specialty Exam: Physical Exam  Review of Systems  Unable to perform ROS: Psychiatric disorder    Blood pressure (!) 101/52, pulse 62, temperature 98.5 F (36.9 C), temperature source Oral, resp. rate 10, height 5' 2"  (1.575 m), weight 58.1 kg (128 lb 1.4 oz), SpO2 91 %.Body mass index is 23.43 kg/m.  General Appearance: Casual and Fairly Groomed  Eye Contact:  Fair  Speech:  Slow  Volume:  Decreased  Mood:  Dysphoric  Affect:  Congruent  Thought Process:  Goal Directed  Orientation:  Full (Time, Place, and Person)  Thought Content:  Logical  Suicidal Thoughts:  Yes.  without intent/plan  Homicidal Thoughts:  No  Memory:  Immediate;   Poor  Judgement:  Fair  Insight:  Shallow  Psychomotor Activity:  Decreased  Concentration:  Concentration: Poor  Recall:  [place] of Knowledge:  Fair  Language:  Fair  Akathisia:  Negative  Handed:  Right  AIMS (if indicated):     Assets:  Communication Skills Desire for Improvement  ADL's:  Intact  Cognition:  WNL  Sleep:       Treatment Plan Summary:   Audrey Meyer is a 61 year old female presently admitted to ICU after an overdose on 6000 mg of trazodone.  She has 2 prior suicide attempts and 3 prior psychiatric hospitalizations. She has a history of schizoaffective disorder, but nothing in her medical record is consistent with thought disorder. 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  She reports that she has been tried on multiple psychiatric medications, but continued to suffer with severe depression. She is open to receiving ECT treatment for  her severe mood symptoms. She is agreeable to psychiatric hospitalization. I would appreciate that social work coordinate with the patient in making a referral for behavioral health hospitalization, with ECT consultation  and services on site.  Notably, the [facility] behavioral health services does have ECT on site.  - Hold psychiatric medications at this time given recent overdose and QT prolonging patient - If she is having sleep difficulties, melatonin 3 mg nightly - Continue sitter at bedside, patient does fulfill IVC criteria if she attempts to elope - Primary team should coordinate with social worker for psychiatric admission referral   Disposition: Recommend psychiatric Inpatient admission when medically cleared.  Aundra Dubin, MD 04/25/2017 11:57 AM

## 2017-04-26 DIAGNOSIS — T50902A Poisoning by unspecified drugs, medicaments and biological substances, intentional self-harm, initial encounter: Secondary | ICD-10-CM

## 2017-04-26 LAB — GLUCOSE, CAPILLARY
Glucose-Capillary: 95 mg/dL (ref 65–99)
Glucose-Capillary: 80 mg/dL (ref 65–99)

## 2017-04-26 LAB — HEMOGLOBIN A1C
Hgb A1c MFr Bld: 5.5 % (ref 4.8–5.6)
Mean Plasma Glucose: 111 mg/dL

## 2017-04-26 MED ORDER — SODIUM CHLORIDE 0.9 % IV SOLN: INTRAVENOUS | Status: DC

## 2017-04-26 NOTE — Progress Notes (Signed)
CSW updated pt she has been accepted and is being transported to [facility].  Pt stated she understood and listed complaints including depression and lack of hope for the future.   CSW actively validated the pt's and provided feedback and active listening. Please reconsult if future social work needs arise. CSW signing off.  Alphonse Guild. Yojan Paskett, Latanya Presser, LCAS Clinical Social Worker Ph: [phone]

## 2017-04-26 NOTE — Discharge Summary (Signed)
Physician Discharge Summary  Audrey Meyer XNT:[id number] DOB: 01-26-1956 DOA: 04/24/2017  PCP: Anda Kraft, MD  Admit date: 04/24/2017 Discharge date: 04/26/2017  Admitted From: Home  Disposition: [place]  Recommendations for Outpatient Follow-up:  1. Follow up with PCP in 1-2 weeks 2. Follow-up with provider at [facility] at earliest [place]: No Equipment/Devices: None  Discharge Condition: Stable CODE STATUS: Full  Diet recommendation: Heart Healthy   Brief/Interim Summary: 61 y.o.femalewith hypertension, depression, COPD was brought to the ER after patient called EMS after consuming 6000 mg of trazodone. Patient was found to have prolonged QTC. She was admitted for further evaluation. She has been evaluated by psychiatry who recommends psychiatric admission once medically stable. Her psychiatric medications are on hold. Her QTC has improved. She'll be discharged to [facility].  Discharge Diagnoses:  Principal Problem:   Drug overdose Active Problems:   Hypothyroidism   Schizoaffective disorder, depressive type ([name])   Essential hypertension   Chronic obstructive pulmonary disease ([place])   Suicide attempt ([name])   Hypotension due to drugs   1. Intentional drug overdose with trazodone with prolonged QTC- patient consumed 6000 mg of trazodone.  - Patient had EKG monitoring every 6 hours for 24 hours. Recent EKG shows QTC of 449. - Currently hemodynamically stable.  - Psychiatric consultation appreciated. Transfer to psychiatric facility once bed is available.  - Stop medications that prolong QTC including Zofran  2.  History of depression with suicidal thoughts - continue bedside sitter. Hold psychiatric medications for now. Discharge to psychiatric facility once bed is available  3. Hypertension - blood pressure improved. Resume antihypertensives upon discharge 4. COPD - no active wheezing. 5. Hypokalemia- improved   Discharge Instructions  Discharge  Instructions    Call MD for:  difficulty breathing, headache or visual disturbances    Complete by:  As directed    Call MD for:  hives    Complete by:  As directed    Call MD for:  persistant dizziness or light-headedness    Complete by:  As directed    Call MD for:  persistant nausea and vomiting    Complete by:  As directed    Call MD for:  severe uncontrolled pain    Complete by:  As directed    Call MD for:  temperature >100.4    Complete by:  As directed    Diet - low sodium heart healthy    Complete by:  As directed    Discharge instructions    Complete by:  As directed    Psychiatric medications will be adjusted by psychiatrist upon transfer to [facility]   Increase activity slowly    Complete by:  As directed      Allergies as of 04/26/2017      Reactions   Budesonide Shortness Of Breath   Promethazine Nausea And Vomiting      Medication List    STOP taking these medications   diazepam 5 MG tablet Commonly known as:  VALIUM   hydrOXYzine 25 MG capsule Commonly known as:  VISTARIL   LATUDA 20 MG Tabs tablet Generic drug:  lurasidone   ondansetron 4 MG disintegrating tablet Commonly known as:  ZOFRAN ODT   ondansetron 4 MG tablet Commonly known as:  ZOFRAN   promethazine 25 MG tablet Commonly known as:  PHENERGAN   sertraline 25 MG tablet Commonly known as:  ZOLOFT   thiothixene 2 MG capsule Commonly known as:  NAVANE   trazodone 300 MG  tablet Commonly known as:  DESYREL   zolpidem 10 MG tablet Commonly known as:  AMBIEN     TAKE these medications   acetaminophen 500 MG tablet Commonly known as:  TYLENOL Take 1,000 mg by mouth every 6 (six) hours as needed for mild pain, moderate pain, fever or headache.   albuterol 108 (90 Base) MCG/ACT inhaler Commonly known as:  PROVENTIL HFA;VENTOLIN HFA Inhale 2 puffs into the lungs every 6 (six) hours as needed for wheezing or shortness of breath.   amLODipine 10 MG tablet Commonly known as:  NORVASC Take  10 mg by mouth daily.   atorvastatin 20 MG tablet Commonly known as:  LIPITOR Take 1 tablet (20 mg total) by mouth at bedtime.   furosemide 20 MG tablet Commonly known as:  LASIX Take 20 mg by mouth daily.   gabapentin 300 MG capsule Commonly known as:  NEURONTIN Take 300 mg by mouth 3 (three) times daily.   levothyroxine 125 MCG tablet Commonly known as:  SYNTHROID, LEVOTHROID Take 125 mcg by mouth daily before breakfast.   lisinopril 10 MG tablet Commonly known as:  PRINIVIL,ZESTRIL Take 10 mg by mouth daily.   magnesium oxide 400 MG tablet Commonly known as:  MAG-OX Take 2 tablets (800 mg total) by mouth 2 (two) times daily.   multivitamin with minerals Tabs tablet Take 1 tablet by mouth daily.   potassium chloride SA 20 MEQ tablet Commonly known as:  K-DUR,KLOR-CON Take 20 mEq by mouth 2 (two) times daily.   rOPINIRole 1 MG tablet Commonly known as:  REQUIP Take 1 mg by mouth at bedtime.      Follow-up Information    Anda Kraft, MD Follow up in 1 week(s).   Specialty:  Endocrinology Contact information: [address] [place] [address] [phone]          Allergies  Allergen Reactions  . Budesonide Shortness Of Breath  . Promethazine Nausea And Vomiting    Consultations:  Psychiatry   Procedures/Studies: None   Subjective: Patient seen and examined at bedside. She denies any overnight chest pain, vomiting, fever. She is a poor historian.  Discharge Exam: Vitals:   04/26/17 1159 04/26/17 1427  BP: (!) 153/79 139/71  Pulse: 76   Resp: 18 15  Temp: 98.2 F (36.8 C) 97.8 F (36.6 C)   Vitals:   04/26/17 1100 04/26/17 1135 04/26/17 1159 04/26/17 1427  BP: (!) 145/65  (!) 153/79 139/71  Pulse: 75  76   Resp: 15  18 15   Temp:  98 F (36.7 C) 98.2 F (36.8 C) 97.8 F (36.6 C)  TempSrc:  Oral Oral Oral  SpO2: 96%  96% 100%  Weight:      Height:        General exam: Appears calm and comfortable   Respiratory system: Bilateral decreased breath sound at bases Cardiovascular system: S1 & S2 heard,Rate controlled  Gastrointestinal system: Abdomen is nondistended, soft and nontender. Normal bowel sounds heard. Central nervous system: Alert and oriented. No focal neurological deficits. Moving extremities Extremities: No cyanosis, clubbing, edema     The results of significant diagnostics from this hospitalization (including imaging, microbiology, ancillary and laboratory) are listed below for reference.     Microbiology: Recent Results (from the past 240 hour(s))  MRSA PCR Screening     Status: None   Collection Time: 04/25/17  1:27 AM  Result Value Ref Range Status   MRSA by PCR NEGATIVE NEGATIVE Final    Comment:  The GeneXpert MRSA Assay (FDA approved for NASAL specimens only), is one component of a comprehensive MRSA colonization surveillance program. It is not intended to diagnose MRSA infection nor to guide or monitor treatment for MRSA infections.      Labs: BNP (last 3 results) No results for input(s): BNP in the last 8760 hours. Basic Metabolic Panel:  Recent Labs Lab 04/24/17 1834 04/25/17 0243  NA 137 140  K 3.0* 3.9  CL 100* 110  CO2 25 26  GLUCOSE 157* 102*  BUN 11 9  CREATININE 0.92 0.79  CALCIUM 9.2 8.0*  MG  --  1.7   Liver Function Tests:  Recent Labs Lab 04/24/17 1834 04/25/17 0243  AST 22 19  ALT 18 15  ALKPHOS 78 62  BILITOT 0.3 0.4  PROT 7.3 5.6*  ALBUMIN 4.1 3.2*   No results for input(s): LIPASE, AMYLASE in the last 168 hours. No results for input(s): AMMONIA in the last 168 hours. CBC:  Recent Labs Lab 04/24/17 1834 04/25/17 0243  WBC 9.8 9.9  HGB 13.6 11.6*  HCT 38.8 34.4*  MCV 89.6 90.1  PLT 270 245   Cardiac Enzymes: No results for input(s): CKTOTAL, CKMB, CKMBINDEX, TROPONINI in the last 168 hours. BNP: Invalid input(s): POCBNP CBG:  Recent Labs Lab 04/24/17 1830 04/25/17 0811 04/25/17 1611  04/25/17 2347 04/26/17 0753  GLUCAP 199* 96 106* 89 95   D-Dimer No results for input(s): DDIMER in the last 72 hours. Hgb A1c  Recent Labs  04/25/17 0243  HGBA1C 5.5   Lipid Profile No results for input(s): CHOL, HDL, LDLCALC, TRIG, CHOLHDL, LDLDIRECT in the last 72 hours. Thyroid function studies No results for input(s): TSH, T4TOTAL, T3FREE, THYROIDAB in the last 72 hours.  Invalid input(s): FREET3 Anemia work up No results for input(s): VITAMINB12, FOLATE, FERRITIN, TIBC, IRON, RETICCTPCT in the last 72 hours. Urinalysis    Component Value Date/Time   COLORURINE COLORLESS (A) 02/07/2017 1100   APPEARANCEUR CLEAR 02/07/2017 1100   LABSPEC 1.003 (L) 02/07/2017 1100   PHURINE 7.0 02/07/2017 1100   GLUCOSEU NEGATIVE 02/07/2017 1100   HGBUR SMALL (A) 02/07/2017 1100   BILIRUBINUR NEGATIVE 02/07/2017 1100   KETONESUR 5 (A) 02/07/2017 1100   PROTEINUR NEGATIVE 02/07/2017 1100   UROBILINOGEN 0.2 09/19/2012 2334   NITRITE NEGATIVE 02/07/2017 1100   LEUKOCYTESUR NEGATIVE 02/07/2017 1100   Sepsis Labs Invalid input(s): PROCALCITONIN,  WBC,  LACTICIDVEN Microbiology Recent Results (from the past 240 hour(s))  MRSA PCR Screening     Status: None   Collection Time: 04/25/17  1:27 AM  Result Value Ref Range Status   MRSA by PCR NEGATIVE NEGATIVE Final    Comment:        The GeneXpert MRSA Assay (FDA approved for NASAL specimens only), is one component of a comprehensive MRSA colonization surveillance program. It is not intended to diagnose MRSA infection nor to guide or monitor treatment for MRSA infections.      Time coordinating discharge: 35 minutes  SIGNED:   Aline August, MD  [facility] 04/26/2017, 4:13 PM Pager: [phone]  If 7PM-7AM, please contact night-coverage [email or web address] Password [email or web address]

## 2017-04-26 NOTE — Progress Notes (Signed)
CSW updated RN and provider and called [facility] ([facility]  CSW will update pt now.   Audrey Meyer. Audrey Meyer, Latanya Presser, LCAS Clinical Social Worker Ph: [phone]

## 2017-04-26 NOTE — Progress Notes (Signed)
Date:  Apr 26, 2017  Chart reviewed for concurrent status and case management needs.  Will continue to follow patient progress.  Discharge Planning: following for needs  Expected discharge date: [id number]  Maloni Musleh, BSN, [place], [place]

## 2017-04-26 NOTE — Progress Notes (Signed)
Patient ID: Audrey Meyer, female   DOB: [birth year], 61 y.o.   MRN: [id number]  PROGRESS NOTE    Audrey Meyer  EXB:[id number] DOB: [birth year] DOA: 04/24/2017 PCP: Anda Kraft, MD   Brief Narrative: 61 y.o. female with hypertension, depression, COPD was brought to the ER after patient called EMS after consuming 6000 mg of trazodone. Patient was found to have prolonged QTC. She was admitted for further evaluation. She has been evaluated by psychiatry who recommends psychiatric admission once medically stable. Her psychiatric medications are on hold.    Assessment & Plan:   Principal Problem:   Drug overdose Active Problems:   Hypothyroidism   Schizoaffective disorder, depressive type ([place])   Essential hypertension   Chronic obstructive pulmonary disease ([place])   Suicide attempt ([place])   Hypotension due to drugs     1. Intentional drug overdose with trazodone with prolonged QTC- patient consumed 6000 mg of trazodone.  - Patient had EKG monitoring every 6 hours for 24 hours. Recent EKG shows QTC of 449. Repeat EKG in a.m. Continue telemetry monitoring. - Currently hemodynamically stable. DC IV fluids if tolerating diet - Psychiatric consultation appreciated. Transfer to psychiatric facility once bed is available. Follow-up with social worker regarding the same.  2.  History of depression with suicidal thoughts - continue bedside sitter. Hold psychiatric medications for now. Discharged to psychiatric facility once bed is available  3. Hypertension - blood pressure improved. May resume antihypertensives upon discharge 4. COPD - no active wheezing. 5. Hypokalemia- improved      DVT prophylaxis: Lovenox Code Status:  Full Family Communication: None present at bedside Disposition Plan: Inpatient psychiatric facility probably tomorrow if bed is available. Transfer patient to telemetry  Consultants: Psychiatry  Procedures: None  Antimicrobials: None Subjective: Patient seen  and examined at bedside. She denies any overnight chest pain, vomiting, fever. She is a poor historian.  Objective: Vitals:   04/26/17 1000 04/26/17 1100 04/26/17 1135 04/26/17 1159  BP: (!) 160/71 (!) 145/65  (!) 153/79  Pulse: 74 75  76  Resp: 14 15  18   Temp:   98 F (36.7 C) 98.2 F (36.8 C)  TempSrc:   Oral Oral  SpO2: 97% 96%  96%  Weight:      Height:        Intake/Output Summary (Last 24 hours) at 04/26/17 1221 Last data filed at 04/26/17 1049  Gross per 24 hour  Intake             2370 ml  Output             4500 ml  Net            -2130 ml   Filed Weights   04/24/17 1839 04/25/17 0111 04/26/17 0500  Weight: 56.2 kg (124 lb) 58.1 kg (128 lb 1.4 oz) 60.9 kg (134 lb 4.2 oz)    Examination:  General exam: Appears calm and comfortable  Respiratory system: Bilateral decreased breath sound at bases Cardiovascular system: S1 & S2 heard,Rate controlled  Gastrointestinal system: Abdomen is nondistended, soft and nontender. Normal bowel sounds heard. Central nervous system: Alert and oriented. No focal neurological deficits. Moving extremities Extremities: No cyanosis, clubbing, edema    Data Reviewed: I have personally reviewed following labs and imaging studies  CBC:  Recent Labs Lab 04/24/17 1834 04/25/17 0243  WBC 9.8 9.9  HGB 13.6 11.6*  HCT 38.8 34.4*  MCV 89.6 90.1  PLT 270 102   Basic Metabolic Panel:  Recent Labs Lab 04/24/17 1834 04/25/17 0243  NA 137 140  K 3.0* 3.9  CL 100* 110  CO2 25 26  GLUCOSE 157* 102*  BUN 11 9  CREATININE 0.92 0.79  CALCIUM 9.2 8.0*  MG  --  1.7   GFR: Estimated Creatinine Clearance: 61.8 mL/min (by C-G formula based on SCr of 0.79 mg/dL). Liver Function Tests:  Recent Labs Lab 04/24/17 1834 04/25/17 0243  AST 22 19  ALT 18 15  ALKPHOS 78 62  BILITOT 0.3 0.4  PROT 7.3 5.6*  ALBUMIN 4.1 3.2*   No results for input(s): LIPASE, AMYLASE in the last 168 hours. No results for input(s): AMMONIA in the  last 168 hours. Coagulation Profile: No results for input(s): INR, PROTIME in the last 168 hours. Cardiac Enzymes: No results for input(s): CKTOTAL, CKMB, CKMBINDEX, TROPONINI in the last 168 hours. BNP (last 3 results) No results for input(s): PROBNP in the last 8760 hours. HbA1C:  Recent Labs  04/25/17 0243  HGBA1C 5.5   CBG:  Recent Labs Lab 04/24/17 1830 04/25/17 0811 04/25/17 1611 04/25/17 2347 04/26/17 0753  GLUCAP 199* 96 106* 89 95   Lipid Profile: No results for input(s): CHOL, HDL, LDLCALC, TRIG, CHOLHDL, LDLDIRECT in the last 72 hours. Thyroid Function Tests: No results for input(s): TSH, T4TOTAL, FREET4, T3FREE, THYROIDAB in the last 72 hours. Anemia Panel: No results for input(s): VITAMINB12, FOLATE, FERRITIN, TIBC, IRON, RETICCTPCT in the last 72 hours. Sepsis Labs: No results for input(s): PROCALCITON, LATICACIDVEN in the last 168 hours.  Recent Results (from the past 240 hour(s))  MRSA PCR Screening     Status: None   Collection Time: 04/25/17  1:27 AM  Result Value Ref Range Status   MRSA by PCR NEGATIVE NEGATIVE Final    Comment:        The GeneXpert MRSA Assay (FDA approved for NASAL specimens only), is one component of a comprehensive MRSA colonization surveillance program. It is not intended to diagnose MRSA infection nor to guide or monitor treatment for MRSA infections.          Radiology Studies: No results found.      Scheduled Meds: . atorvastatin  20 mg Oral QHS  . levothyroxine  125 mcg Oral QAC breakfast  . magnesium oxide  800 mg Oral BID  . mouth rinse  15 mL Mouth Rinse BID  . nicotine  14 mg Transdermal Daily  . potassium chloride SA  20 mEq Oral BID   Continuous Infusions:   LOS: 2 days        Aline August, MD [facility] Pager [phone]  If 7PM-7AM, please contact night-coverage [email or web address] Password [facility] 04/26/2017, 12:21 PM

## 2017-04-26 NOTE — Progress Notes (Signed)
Report given to Anderson Malta, Therapist, sports at [facility]. No questions or concerns at this time. Patient is being transferred via [facility].

## 2017-04-26 NOTE — Progress Notes (Addendum)
CSW spoke with Otila Kluver at [facility] who reports bed availability likely today. Will await return call from [facility] and assist with transfer to inpatient psych when bed available. Referrals also made to [facility], [place], [place], [place], and [place].   Wandra Feinstein, MSW, LCSW

## 2017-04-26 NOTE — Progress Notes (Signed)
CSW received a call from [place] at [facility] Pt has been accepted by: [facility] Number for report is: [phone] Pt's room/bed number will be: 402-A Pt's unit will be: [facility] Accepting physician: Dr. Elnora Morrison  Pt can arrive ASAP on 04/26/17  CSW will update RN and EDP.  Alphonse Guild. Mariama Saintvil, Latanya Presser, LCAS Clinical Social Worker Ph: [phone]

## 2017-05-06 ENCOUNTER — Encounter (HOSPITAL_COMMUNITY): Payer: Self-pay

## 2017-05-06 ENCOUNTER — Encounter (HOSPITAL_COMMUNITY): Payer: Self-pay | Admitting: *Deleted

## 2017-05-06 ENCOUNTER — Emergency Department (HOSPITAL_COMMUNITY)
Admission: EM | Admit: 2017-05-06 | Discharge: 2017-05-07 | Disposition: A | Discharge status: Home / Self Care | Payer: Medicare Other | Attending: Emergency Medicine | Admitting: Emergency Medicine

## 2017-05-06 ENCOUNTER — Emergency Department (HOSPITAL_COMMUNITY)
Admission: EM | Admit: 2017-05-06 | Discharge: 2017-05-06 | Disposition: A | Discharge status: Home / Self Care | Payer: Medicare Other | Attending: Emergency Medicine | Admitting: Emergency Medicine

## 2017-05-06 DIAGNOSIS — F17290 Nicotine dependence, other tobacco product, uncomplicated: Secondary | ICD-10-CM | POA: Diagnosis not present

## 2017-05-06 DIAGNOSIS — Z79899 Other long term (current) drug therapy: Secondary | ICD-10-CM | POA: Insufficient documentation

## 2017-05-06 DIAGNOSIS — F259 Schizoaffective disorder, unspecified: Secondary | ICD-10-CM

## 2017-05-06 DIAGNOSIS — E039 Hypothyroidism, unspecified: Secondary | ICD-10-CM | POA: Insufficient documentation

## 2017-05-06 DIAGNOSIS — J449 Chronic obstructive pulmonary disease, unspecified: Secondary | ICD-10-CM | POA: Insufficient documentation

## 2017-05-06 DIAGNOSIS — I10 Essential (primary) hypertension: Secondary | ICD-10-CM | POA: Diagnosis not present

## 2017-05-06 DIAGNOSIS — R45851 Suicidal ideations: Secondary | ICD-10-CM

## 2017-05-06 DIAGNOSIS — F338 Other recurrent depressive disorders: Secondary | ICD-10-CM | POA: Insufficient documentation

## 2017-05-06 DIAGNOSIS — F331 Major depressive disorder, recurrent, moderate: Secondary | ICD-10-CM | POA: Diagnosis present

## 2017-05-06 LAB — CBC
HEMATOCRIT: 40.1 % (ref 36.0–46.0)
HEMATOCRIT: 39.1 % (ref 36.0–46.0)
HEMOGLOBIN: 14.3 g/dL (ref 12.0–15.0)
Hemoglobin: 14.1 g/dL (ref 12.0–15.0)
MCH: 31.8 pg (ref 26.0–34.0)
MCH: 32.2 pg (ref 26.0–34.0)
MCHC: 35.7 g/dL (ref 30.0–36.0)
MCHC: 36.1 g/dL — ABNORMAL HIGH (ref 30.0–36.0)
MCV: 89.1 fL (ref 78.0–100.0)
MCV: 89.3 fL (ref 78.0–100.0)
PLATELETS: 334 10*3/uL (ref 150–400)
Platelets: 323 10*3/uL (ref 150–400)
RBC: 4.5 MIL/uL (ref 3.87–5.11)
RBC: 4.38 MIL/uL (ref 3.87–5.11)
RDW: 12.8 % (ref 11.5–15.5)
RDW: 12.9 % (ref 11.5–15.5)
WBC: 8.5 10*3/uL (ref 4.0–10.5)
WBC: 12.4 10*3/uL — ABNORMAL HIGH (ref 4.0–10.5)

## 2017-05-06 LAB — COMPREHENSIVE METABOLIC PANEL
ALK PHOS: 81 U/L (ref 38–126)
ALT: 22 U/L (ref 14–54)
AST: 22 U/L (ref 15–41)
Albumin: 4.2 g/dL (ref 3.5–5.0)
Anion gap: 8 (ref 5–15)
BUN: 8 mg/dL (ref 6–20)
CALCIUM: 9.5 mg/dL (ref 8.9–10.3)
CO2: 26 mmol/L (ref 22–32)
CREATININE: 0.77 mg/dL (ref 0.44–1.00)
Chloride: 105 mmol/L (ref 101–111)
GFR calc Af Amer: >60 mL/min (ref >60)
GFR calc non Af Amer: >60 mL/min (ref >60)
GLUCOSE: 118 mg/dL — AB (ref 65–99)
Potassium: 4.2 mmol/L (ref 3.5–5.1)
SODIUM: 139 mmol/L (ref 135–145)
Total Bilirubin: 0.4 mg/dL (ref 0.3–1.2)
Total Protein: 7.3 g/dL (ref 6.5–8.1)

## 2017-05-06 LAB — ETHANOL: Alcohol, Ethyl (B): <5 mg/dL (ref <5)

## 2017-05-06 LAB — RAPID URINE DRUG SCREEN, HOSP PERFORMED
Amphetamines: NOT DETECTED
BARBITURATES: NOT DETECTED
Benzodiazepines: NOT DETECTED
Cocaine: NOT DETECTED
Opiates: NOT DETECTED
Tetrahydrocannabinol: NOT DETECTED

## 2017-05-06 LAB — ACETAMINOPHEN LEVEL: Acetaminophen (Tylenol), Serum: <10 ug/mL — ABNORMAL LOW (ref 10–30)

## 2017-05-06 LAB — SALICYLATE LEVEL

## 2017-05-06 MED ORDER — ACETAMINOPHEN 325 MG PO TABS: 650.0000 mg | ORAL_TABLET | ORAL | Status: DC | PRN

## 2017-05-06 MED ORDER — ALUM & MAG HYDROXIDE-SIMETH 200-200-20 MG/5ML PO SUSP: 30.0000 mL | Freq: Four times a day (QID) | ORAL | Status: DC | PRN

## 2017-05-06 NOTE — ED Notes (Signed)
Pt stated "I didn't want to kill myself.  I just wanted attention from my ex-husband."

## 2017-05-06 NOTE — ED Triage Notes (Signed)
BIB [facility], they state pt called them multiple times and stated that she will attempt suicide. [facility] reports pt was recently d/c'd from [place]. Pt arrives ambulatory, not verbally communicating when asked if she is SI or HI by this RN. Pt appears to be in no acute physical distress.

## 2017-05-06 NOTE — [facility] Suicide Risk Assessment (Cosign Needed Addendum)
Suicide Risk Assessment  Discharge Assessment   [facility] Discharge Suicide Risk Assessment   Principal Problem: Major depressive disorder, recurrent episode, moderate ([place]) Discharge Diagnoses:  Patient Active Problem List   Diagnosis Date Noted  . Major depressive disorder, recurrent episode, moderate ([place]) [F33.1] 04/28/2016    Priority: High  . Suicide attempt ([place]) [T14.91XA] 04/25/2017  . Hypotension due to drugs [I95.2] 04/25/2017  . Drug overdose [T50.901A] 04/24/2017  . Hyponatremia [E87.1] 01/31/2017  . Hypomagnesemia [E83.42] 01/31/2017  . Chronic obstructive pulmonary disease ([place]) [J44.9]   . Hypokalemia [E87.6] 03/07/2016  . Hypothyroidism [E03.9] 03/07/2016  . Schizoaffective disorder, depressive type ([place]) [F25.1] 03/07/2016  . Essential hypertension [I10] 03/07/2016  . Tobacco abuse [Z72.0] 03/07/2016    Total Time spent with patient: 45 minutes  Musculoskeletal: Strength & Muscle Tone: within normal limits Gait & Station: normal Patient leans: N/A  Psychiatric Specialty Exam:   Blood pressure (!) 124/50, pulse 62, temperature 98.4 F (36.9 C), temperature source Oral, resp. rate 16, SpO2 99 %.There is no height or weight on file to calculate BMI.  General Appearance: Casual  Eye Contact::  Good  Speech:  Normal Rate409  Volume:  Normal  Mood:  Depressed, mild  Affect:  Congruent  Thought Process:  Coherent and Descriptions of Associations: Intact  Orientation:  Full (Time, Place, and Person)  Thought Content:  WDL and Logical  Suicidal Thoughts:  No  Homicidal Thoughts:  No  Memory:  Immediate;   Good Recent;   Good Remote;   Good  Judgement:  Fair  Insight:  Fair  Psychomotor Activity:  Normal  Concentration:  Good  Recall:  Good  Fund of Knowledge:Good  Language: Good  Akathisia:  No  Handed:  Right  AIMS (if indicated):     Assets:  Housing Leisure Time Physical Health Resilience  Sleep:     Cognition: WNL  ADL's:  Intact   Mental  Status Per Nursing Assessment::   On Admission:   Patient reports she "called 911, just wanted attention."  Consistent with this history since admission.  Denies suicidal/homicidal ideations, hallucinations, and alcohol/drug abuse.  Discussed assisted living but she was not interested along with senior day programs.  She reports she is moving in with her son this month which is a good thing according to her.  Stable for discharge.  Demographic Factors:  Caucasian and Living alone  Loss Factors: NA  Historical Factors: NA  Risk Reduction Factors:   Sense of responsibility to family, Positive social support and Positive therapeutic relationship  Continued Clinical Symptoms:  Depression, mild  Cognitive Features That Contribute To Risk:  None    Suicide Risk:  Minimal: No identifiable suicidal ideation.  Patients presenting with no risk factors but with morbid ruminations; may be classified as minimal risk based on the severity of the depressive symptoms    Plan Of Care/Follow-up recommendations:  Activity:  as tolerated Diet:  heart healhty diet  Juwann Sherk, NP 05/06/2017, 10:27 AM

## 2017-05-06 NOTE — ED Provider Notes (Signed)
[facility] DEPT Provider Note   CSN: [id number] Arrival date & time: 05/06/17  0800     History   Chief Complaint Chief Complaint  Patient presents with  . Suicidal    HPI Audrey Meyer is a 61 y.o. female.  HPI  61 year old female with history of schizoaffective disorder including recent hospitalization following suicide attempt who presents with suicidal ideation. Patient was reportedly just released from [facility] following admission for suicidal attempt via taking trazodone. She states that since returning home, she has felt generally weak and alone. She felt like she had no support. She began to feel extremely lonely when she got into an argument with her ex-husband. She suddenly called the police. Per police, she called [phone] multiple times stating that she was going to kill herself by taking pills. On arrival, patient was in bed but had purposefully unlocked the dorsum of the police could get him. There are multiple bottles of pills throughout the home but none seem to have been recently taken. In route, the patient did have mild nausea but stated to the police that she just needed attention. Currently, she denies suicidal ideation but does state that she needed attention. She states she has no support at home and feels very lonely there. Denies suicidal plan, though she states she does have plenty of medications in the home. No homicidal ideation. No hallucinations.  Past Medical History:  Diagnosis Date  . Acid reflux   . Breast cancer ([place])   . COPD (chronic obstructive pulmonary disease) ([place])   . High cholesterol   . Hypertension   . Hypothyroid   . Schizoaffective disorder     Patient Active Problem List   Diagnosis Date Noted  . Suicide attempt ([place]) 04/25/2017  . Hypotension due to drugs 04/25/2017  . Drug overdose 04/24/2017  . Hyponatremia 01/31/2017  . Hypomagnesemia 01/31/2017  . Major depressive disorder, recurrent episode, moderate ([place]) 04/28/2016    . Chronic obstructive pulmonary disease ([place])   . Hypokalemia 03/07/2016  . Hypothyroidism 03/07/2016  . Schizoaffective disorder, depressive type ([place]) 03/07/2016  . Essential hypertension 03/07/2016  . Tobacco abuse 03/07/2016    Past Surgical History:  Procedure Laterality Date  . APPENDECTOMY    . MASTECTOMY, PARTIAL    . TUBAL LIGATION      OB History    No data available       Home Medications    Prior to Admission medications   Medication Sig Start Date End Date Taking? Authorizing Provider  acetaminophen (TYLENOL) 500 MG tablet Take 1,000 mg by mouth every 6 (six) hours as needed for mild pain, moderate pain, fever or headache.    [provider]  albuterol (PROVENTIL HFA;VENTOLIN HFA) 108 (90 Base) MCG/ACT inhaler Inhale 2 puffs into the lungs every 6 (six) hours as needed for wheezing or shortness of breath.    [provider]  amLODipine (NORVASC) 10 MG tablet Take 10 mg by mouth daily.    [provider]  atorvastatin (LIPITOR) 20 MG tablet Take 1 tablet (20 mg total) by mouth at bedtime. 05/05/16   Withrow, Elyse Jarvis, FNP  furosemide (LASIX) 20 MG tablet Take 20 mg by mouth daily.     [provider]  gabapentin (NEURONTIN) 300 MG capsule Take 300 mg by mouth 3 (three) times daily.     [provider]  levothyroxine (SYNTHROID, LEVOTHROID) 125 MCG tablet Take 125 mcg by mouth daily before breakfast.     [provider]  lisinopril (PRINIVIL,ZESTRIL) 10 MG tablet Take 10 mg by mouth daily.    [provider]  magnesium oxide (MAG-OX) 400 MG tablet Take 2 tablets (800 mg total) by mouth 2 (two) times daily. 02/03/17   Alfonzo Beers, MD  Multiple Vitamin (MULTIVITAMIN WITH MINERALS) TABS tablet Take 1 tablet by mouth daily.    [provider]  potassium chloride SA (K-DUR,KLOR-CON) 20 MEQ tablet Take 20 mEq by mouth 2 (two) times daily.     [provider]  rOPINIRole (REQUIP) 1 MG tablet Take 1  mg by mouth at bedtime.    [provider]    Family History Family History  Problem Relation Age of Onset  . Heart attack Father   . Hypertension Father   . Cancer Other     Social History Social History  Substance Use Topics  . Smoking status: Current Every Day Smoker    Packs/day: 0.00    Years: 2.00    Types: Cigars  . Smokeless tobacco: Never Used  . Alcohol use No     Allergies   Budesonide and Promethazine   Review of Systems Review of Systems  Constitutional: Positive for fatigue. Negative for fever.  Respiratory: Negative for cough and shortness of breath.   Psychiatric/Behavioral: Positive for dysphoric mood and sleep disturbance. The patient is nervous/anxious.   All other systems reviewed and are negative.    Physical Exam Updated Vital Signs BP (!) 149/58 (BP Location: Left Arm)   Pulse 67   Temp 98.3 F (36.8 C) (Oral)   Resp 16   SpO2 99%   Physical Exam  Constitutional: She is oriented to person, place, and time. She appears well-developed and well-nourished. No distress.  HENT:  Head: Normocephalic and atraumatic.  Eyes: Conjunctivae are normal.  Neck: Neck supple.  Cardiovascular: Normal rate, regular rhythm and normal heart sounds.  Exam reveals no friction rub.   No murmur heard. Pulmonary/Chest: Effort normal and breath sounds normal. No respiratory distress. She has no wheezes. She has no rales.  Abdominal: She exhibits no distension.  Musculoskeletal: She exhibits no edema.  Neurological: She is alert and oriented to person, place, and time. She exhibits normal muscle tone.  Skin: Skin is warm. Capillary refill takes less than 2 seconds.  Psychiatric: She is withdrawn. She exhibits a depressed mood. She expresses suicidal ideation.  Nursing note and vitals reviewed.    ED Treatments / Results  Labs (all labs ordered are listed, but only abnormal results are displayed) Labs Reviewed  COMPREHENSIVE METABOLIC PANEL -  Abnormal; Notable for the following:       Result Value   Glucose, Bld 118 (*)    All other components within normal limits  ACETAMINOPHEN LEVEL - Abnormal; Notable for the following:    Acetaminophen (Tylenol), Serum <10 (*)    All other components within normal limits  ETHANOL  SALICYLATE LEVEL  CBC  RAPID URINE DRUG SCREEN, HOSP PERFORMED    EKG  EKG Interpretation None      Sinus bradycardia. The R 58, PR 136, QRS 100, QTC 408. Since last EKG, QTc has shortened.  Radiology No results found.  Procedures Procedures (including critical care time)  Medications Ordered in ED Medications  acetaminophen (TYLENOL) tablet 650 mg (not administered)  alum & mag hydroxide-simeth (MAALOX/MYLANTA) 200-200-20 MG/5ML suspension 30 mL (not administered)     Initial Impression / Assessment and Plan / ED Course  I have reviewed the triage vital signs and the nursing notes.  Pertinent labs & imaging results that were available during my care of the patient were reviewed by me and considered in my medical decision making (see chart for details).    62 year old female with schizoaffective disorder and history of recent suicide attempt here with reported suicidal ideation. Brought in by police. On arrival, she declines suicidal ideation and states she just wanted attention. However, given her recent suicide attempt and history of similar presentations, do believe she is high risk and will consult TTS. Patient is otherwise medically stable. Screening labs have been sent. She denies taking any meds and appears to be at her mental baseline at this time. Clear for psych evaluation and disposition.  This note was prepared with assistance of Systems analyst. Occasional wrong-word or sound-a-like substitutions may have occurred due to the inherent limitations of voice recognition software.   Final Clinical Impressions(s) / ED Diagnoses   Final diagnoses:  Suicidal ideation     New Prescriptions New Prescriptions   No medications on file     Duffy Bruce, MD 05/06/17 [phone]

## 2017-05-06 NOTE — ED Triage Notes (Signed)
Pt brought in by [employer].  Pt informed [employer] she was suicidal, was going to take pills and showed the officers the pills.

## 2017-05-06 NOTE — ED Notes (Signed)
[employer] arrived with IVC paperwork and also paperwork from [facility].  Pt there from 04/26/17 to 05/05/17.

## 2017-05-06 NOTE — ED Notes (Signed)
Psychiatric staff notified this RN and pt that she will be discharged. Pt called taxi stating that she does not have time to wait for discharge paperwork.

## 2017-05-07 ENCOUNTER — Emergency Department (HOSPITAL_COMMUNITY)
Admission: EM | Admit: 2017-05-07 | Discharge: 2017-05-07 | Disposition: A | Discharge status: Home / Self Care | Payer: Medicare Other | Source: Home / Self Care | Attending: Emergency Medicine | Admitting: Emergency Medicine

## 2017-05-07 ENCOUNTER — Encounter (HOSPITAL_COMMUNITY): Payer: Self-pay | Admitting: Emergency Medicine

## 2017-05-07 DIAGNOSIS — I10 Essential (primary) hypertension: Secondary | ICD-10-CM | POA: Insufficient documentation

## 2017-05-07 DIAGNOSIS — F259 Schizoaffective disorder, unspecified: Secondary | ICD-10-CM | POA: Insufficient documentation

## 2017-05-07 DIAGNOSIS — F339 Major depressive disorder, recurrent, unspecified: Secondary | ICD-10-CM

## 2017-05-07 DIAGNOSIS — E039 Hypothyroidism, unspecified: Secondary | ICD-10-CM

## 2017-05-07 DIAGNOSIS — J449 Chronic obstructive pulmonary disease, unspecified: Secondary | ICD-10-CM | POA: Insufficient documentation

## 2017-05-07 DIAGNOSIS — Z79899 Other long term (current) drug therapy: Secondary | ICD-10-CM | POA: Insufficient documentation

## 2017-05-07 DIAGNOSIS — F3340 Major depressive disorder, recurrent, in remission, unspecified: Secondary | ICD-10-CM

## 2017-05-07 DIAGNOSIS — F17210 Nicotine dependence, cigarettes, uncomplicated: Secondary | ICD-10-CM | POA: Insufficient documentation

## 2017-05-07 LAB — COMPREHENSIVE METABOLIC PANEL
ALBUMIN: 4.4 g/dL (ref 3.5–5.0)
ALT: 22 U/L (ref 14–54)
AST: 26 U/L (ref 15–41)
Alkaline Phosphatase: 79 U/L (ref 38–126)
Anion gap: 13 (ref 5–15)
BUN: 6 mg/dL (ref 6–20)
CHLORIDE: 103 mmol/L (ref 101–111)
CO2: 24 mmol/L (ref 22–32)
CREATININE: 0.7 mg/dL (ref 0.44–1.00)
Calcium: 9.6 mg/dL (ref 8.9–10.3)
GFR calc Af Amer: >60 mL/min (ref >60)
GFR calc non Af Amer: >60 mL/min (ref >60)
Glucose, Bld: 119 mg/dL — ABNORMAL HIGH (ref 65–99)
POTASSIUM: 3.1 mmol/L — AB (ref 3.5–5.1)
SODIUM: 140 mmol/L (ref 135–145)
Total Bilirubin: 0.5 mg/dL (ref 0.3–1.2)
Total Protein: 7.5 g/dL (ref 6.5–8.1)

## 2017-05-07 LAB — ETHANOL: Alcohol, Ethyl (B): <5 mg/dL (ref <5)

## 2017-05-07 LAB — SALICYLATE LEVEL: Salicylate Lvl: <7 mg/dL (ref 2.8–30.0)

## 2017-05-07 LAB — ACETAMINOPHEN LEVEL: Acetaminophen (Tylenol), Serum: <10 ug/mL — ABNORMAL LOW (ref 10–30)

## 2017-05-07 NOTE — ED Triage Notes (Signed)
Patient returned to ED after being discharged this morning.  Patient reports she is feeling suicidal without a plan.  Denies HI or AVH.

## 2017-05-07 NOTE — ED Provider Notes (Signed)
[facility] DEPT Provider Note   CSN: [id number] Arrival date & time: 05/07/17  0701     History   Chief Complaint Chief Complaint  Patient presents with  . Suicidal    HPI Audrey Meyer is a 61 y.o. female.  61 year old female with past medical history including schizoaffective disorder, depression, COPD, hypertension, hyperlipidemia, breast cancer, hypothyroidism who presents with depression. The patient was here yesterday afternoon and again overnight presenting with ongoing depression and suicidal ideation. She returns this morning stating that she is still depressed. When asked about suicidal thoughts she states that she has them "sometimes." She denies any specific plan or recent suicide attempts. She does have previous suicide attempt by overdose. She states that she has been taking her medications. She has an appointment in 3 days with her psychiatrist. She denies any HI, hallucinations, alcohol use or drug use. She is single and lives alone. She states that she has had no changes since she was last discharged.   The history is provided by the patient.    Past Medical History:  Diagnosis Date  . Acid reflux   . Breast cancer ([place])   . COPD (chronic obstructive pulmonary disease) ([place])   . High cholesterol   . Hypertension   . Hypothyroid   . Schizoaffective disorder     Patient Active Problem List   Diagnosis Date Noted  . Suicide attempt ([place]) 04/25/2017  . Hypotension due to drugs 04/25/2017  . Drug overdose 04/24/2017  . Hyponatremia 01/31/2017  . Hypomagnesemia 01/31/2017  . Major depressive disorder, recurrent episode, moderate ([place]) 04/28/2016  . Chronic obstructive pulmonary disease ([place])   . Hypokalemia 03/07/2016  . Hypothyroidism 03/07/2016  . Schizoaffective disorder, depressive type ([place]) 03/07/2016  . Essential hypertension 03/07/2016  . Tobacco abuse 03/07/2016    Past Surgical History:  Procedure Laterality Date  . APPENDECTOMY    .  MASTECTOMY, PARTIAL    . TUBAL LIGATION      OB History    No data available       Home Medications    Prior to Admission medications   Medication Sig Start Date End Date Taking? Authorizing Provider  amLODipine (NORVASC) 10 MG tablet Take 10 mg by mouth daily. 04/17/17  Yes [provider]  atorvastatin (LIPITOR) 20 MG tablet Take 1 tablet (20 mg total) by mouth at bedtime. 05/05/16  Yes Withrow, Elyse Jarvis, FNP  furosemide (LASIX) 20 MG tablet Take 20 mg by mouth daily. 05/01/17  Yes [provider]  gabapentin (NEURONTIN) 300 MG capsule Take 300 mg by mouth 3 (three) times daily.    Yes [provider]  hydrOXYzine (VISTARIL) 25 MG capsule Take 25 mg by mouth daily. 05/05/17  Yes [provider]  levothyroxine (SYNTHROID, LEVOTHROID) 125 MCG tablet Take 125 mcg by mouth daily before breakfast.    Yes [provider]  lisinopril (PRINIVIL,ZESTRIL) 10 MG tablet Take 10 mg by mouth daily.   Yes [provider]  magnesium oxide (MAG-OX) 400 MG tablet Take 2 tablets (800 mg total) by mouth 2 (two) times daily. 02/03/17  Yes Alfonzo Beers, MD  mirtazapine (REMERON) 15 MG tablet Take 15 mg by mouth at bedtime. 05/05/17  Yes [provider]  Multiple Vitamin (MULTIVITAMIN WITH MINERALS) TABS tablet Take 1 tablet by mouth daily.   Yes [provider]  potassium chloride SA (K-DUR,KLOR-CON) 20 MEQ tablet Take 20 mEq by mouth 2 (two) times daily.    Yes [provider]  rOPINIRole (REQUIP) 1 MG tablet Take 1 mg by mouth at bedtime.   Yes [provider]  sertraline (ZOLOFT) 100 MG tablet Take 100 mg by mouth daily. 05/05/17  Yes [provider]  thiothixene (NAVANE) 5 MG capsule Take 5 mg by mouth at bedtime. 05/05/17  Yes [provider]  zolpidem (AMBIEN) 10 MG tablet Take 10 mg by mouth at bedtime. 04/11/17  Yes [provider]    Family History Family History  Problem Relation Age of Onset    . Heart attack Father   . Hypertension Father   . Cancer Other     Social History Social History  Substance Use Topics  . Smoking status: Current Every Day Smoker    Packs/day: 0.00    Years: 2.00    Types: Cigars  . Smokeless tobacco: Never Used  . Alcohol use No     Allergies   Budesonide and Promethazine   Review of Systems Review of Systems All other systems reviewed and are negative except that which was mentioned in HPI  Physical Exam Updated Vital Signs There were no vitals taken for this visit.  Physical Exam  Constitutional: She is oriented to person, place, and time. She appears well-developed and well-nourished. No distress.  HENT:  Head: Normocephalic and atraumatic.  Mouth/Throat: Oropharynx is clear and moist.  Moist mucous membranes  Eyes: Conjunctivae are normal.  Neck: Neck supple.  Cardiovascular: Normal rate, regular rhythm and normal heart sounds.   No murmur heard. Pulmonary/Chest: Effort normal and breath sounds normal.  Musculoskeletal: She exhibits no edema.  Neurological: She is alert and oriented to person, place, and time.  Fluent speech  Skin: Skin is warm and dry.  Psychiatric:  Depressed mood, flat affect, avoids eye contact  Nursing note and vitals reviewed.    ED Treatments / Results  Labs (all labs ordered are listed, but only abnormal results are displayed) Labs Reviewed - No data to display  EKG  EKG Interpretation None       Radiology No results found.  Procedures Procedures (including critical care time)  Medications Ordered in ED Medications - No data to display   Initial Impression / Assessment and Plan / ED Course  I have reviewed the triage vital signs and the nursing notes.       Pt w/ h/o depression presents this morning for ongoing depression, has been evaluated twice in the past 12 hours including psychiatry evaluation by NP Thedora Hinders. On exam, I asked her multiple times if she had any specific  plan, any changes in her thoughts; she denied any specific plan. During conversation I could not identify any change in her story based on my review of 2 previous ED notes and NP Lord's assessment note. Based on this and NP Lord's conclusion that she does not require inpatient treatment, I explained to patient that she would need to f/u closely with psychiatrist and continue home medications. Instructed her to return if any of her SI worsens or she has drastic worsening of condition. She voiced understanding of this plan and denied any further needs or complaints at this time.  Final Clinical Impressions(s) / ED Diagnoses   Final diagnoses:  Recurrent major depressive disorder, remission status unspecified ([place])    New Prescriptions New Prescriptions   No medications on file     Little, Wenda Overland, MD 05/07/17 [phone]

## 2017-05-07 NOTE — ED Notes (Signed)
IVC rescinded by Dr. Eulis Foster @ 0210.

## 2017-05-07 NOTE — ED Notes (Signed)
Off-duty called [employer] again & has been informed an officer has been dispatched.

## 2017-05-07 NOTE — ED Notes (Signed)
Pt stating "The police promised he would take me home.  I don't have a way home.  I loaned my car to my ex-husband."  Off-duty called to speak with pt.

## 2017-05-07 NOTE — ED Notes (Signed)
ED Provider at bedside. 

## 2017-05-07 NOTE — ED Provider Notes (Signed)
[facility] DEPT Provider Note   CSN: [id number] Arrival date & time: 05/06/17  2225  By signing my name below, I, Audrey Meyer, attest that this documentation has been prepared under the direction and in the presence of [name], MD. Electronically Signed: Marcello Meyer, ED Scribe. 05/07/17. 2:17 AM.  History   Chief Complaint Chief Complaint  Patient presents with  . IVC  . Suicidal   The history is provided by the patient. No language interpreter was used.    HPI Comments: Audrey Meyer is a 61 y.o. female, with a PMHx of schizoaffective disorder, who presents to the Emergency Department complaining of  suicidal ideations. She reports that she's going to begin to live with her son, but is currently living alone in an apartment. The pt additionally reports that she called [employer] and told them that she was considering committing suicide. Her psychiatrist is Dr. Josph Macho at [facility]. The pt states that she was recently discharged from [facility].  After calling the police, the officer who responded placed her under involuntary commitment, and transferred her here for evaluation.  The patient states that she called the police, "to get attention."  Patient had an identical presentation, on the morning of 05/06/17.  At that time she was seen by psychiatry who felt she was stable for discharge.  Currently, the patient states that she does not intend to kill herself or harm herself in any way.  There are no other known modifying factors. `  Past Medical History:  Diagnosis Date  . Acid reflux   . Breast cancer ([place])   . COPD (chronic obstructive pulmonary disease) ([place])   . High cholesterol   . Hypertension   . Hypothyroid   . Schizoaffective disorder     Patient Active Problem List   Diagnosis Date Noted  . Suicide attempt ([place]) 04/25/2017  . Hypotension due to drugs 04/25/2017  . Drug overdose 04/24/2017  . Hyponatremia 01/31/2017  . Hypomagnesemia 01/31/2017    . Major depressive disorder, recurrent episode, moderate ([place]) 04/28/2016  . Chronic obstructive pulmonary disease ([place])   . Hypokalemia 03/07/2016  . Hypothyroidism 03/07/2016  . Schizoaffective disorder, depressive type ([place]) 03/07/2016  . Essential hypertension 03/07/2016  . Tobacco abuse 03/07/2016    Past Surgical History:  Procedure Laterality Date  . APPENDECTOMY    . MASTECTOMY, PARTIAL    . TUBAL LIGATION      OB History    No data available       Home Medications    Prior to Admission medications   Medication Sig Start Date End Date Taking? Authorizing Provider  amLODipine (NORVASC) 10 MG tablet Take 10 mg by mouth daily. 04/17/17   [provider]  atorvastatin (LIPITOR) 20 MG tablet Take 1 tablet (20 mg total) by mouth at bedtime. 05/05/16   Withrow, Elyse Jarvis, FNP  furosemide (LASIX) 20 MG tablet Take 20 mg by mouth daily. 05/01/17   [provider]  gabapentin (NEURONTIN) 300 MG capsule Take 300 mg by mouth 3 (three) times daily.     [provider]  hydrOXYzine (VISTARIL) 25 MG capsule Take 25 mg by mouth daily. 05/05/17   [provider]  levothyroxine (SYNTHROID, LEVOTHROID) 125 MCG tablet Take 125 mcg by mouth daily before breakfast.     [provider]  lisinopril (PRINIVIL,ZESTRIL) 10 MG tablet Take 10 mg by mouth daily.    [provider]  magnesium oxide (MAG-OX) 400 MG tablet Take 2 tablets (800 mg  total) by mouth 2 (two) times daily. 02/03/17   Alfonzo Beers, MD  mirtazapine (REMERON) 15 MG tablet Take 15 mg by mouth at bedtime. 05/05/17   [provider]  Multiple Vitamin (MULTIVITAMIN WITH MINERALS) TABS tablet Take 1 tablet by mouth daily.    [provider]  potassium chloride SA (K-DUR,KLOR-CON) 20 MEQ tablet Take 20 mEq by mouth 2 (two) times daily.     [provider]  rOPINIRole (REQUIP) 1 MG tablet Take 1 mg by mouth at bedtime.    [provider]  sertraline (ZOLOFT)  100 MG tablet Take 100 mg by mouth daily. 05/05/17   [provider]  thiothixene (NAVANE) 5 MG capsule Take 5 mg by mouth at bedtime. 05/05/17   [provider]  zolpidem (AMBIEN) 10 MG tablet Take 10 mg by mouth at bedtime. 04/11/17   [provider]    Family History Family History  Problem Relation Age of Onset  . Heart attack Father   . Hypertension Father   . Cancer Other     Social History Social History  Substance Use Topics  . Smoking status: Current Every Day Smoker    Packs/day: 0.00    Years: 2.00    Types: Cigars  . Smokeless tobacco: Never Used  . Alcohol use No     Allergies   Budesonide and Promethazine   Review of Systems Review of Systems  Psychiatric/Behavioral: Negative for suicidal ideas (resolved). The patient is nervous/anxious.   All other systems reviewed and are negative.    Physical Exam Updated Vital Signs BP (!) 160/90 (BP Location: Right Arm)   Pulse 100   Temp 97.5 F (36.4 C) (Oral)   Resp 16   Ht 5\' 2"  (1.575 m)   Wt 56.2 kg (124 lb)   SpO2 97%   BMI 22.68 kg/m   Physical Exam  Constitutional: She is oriented to person, place, and time. She appears well-developed and well-nourished.  HENT:  Head: Normocephalic and atraumatic.  Eyes: Conjunctivae and EOM are normal. Pupils are equal, round, and reactive to light.  Neck: Normal range of motion and phonation normal. Neck supple.  Cardiovascular: Normal rate and regular rhythm.   Pulmonary/Chest: Effort normal and breath sounds normal. She exhibits no tenderness.  Abdominal: Soft. She exhibits no distension. There is no tenderness. There is no guarding.  Musculoskeletal: Normal range of motion.  Neurological: She is alert and oriented to person, place, and time. She exhibits normal muscle tone.  Skin: Skin is warm and dry.  Psychiatric: She has a normal mood and affect. Her behavior is normal. Judgment and thought content normal.  Nursing note and vitals  reviewed.    ED Treatments / Results   DIAGNOSTIC STUDIES:    COORDINATION OF CARE: 2:00 AM-Discussed next steps with pt. Pt verbalized understanding and is agreeable with the plan.   Labs (all labs ordered are listed, but only abnormal results are displayed) Labs Reviewed  COMPREHENSIVE METABOLIC PANEL - Abnormal; Notable for the following:       Result Value   Potassium 3.1 (*)    Glucose, Bld 119 (*)    All other components within normal limits  ACETAMINOPHEN LEVEL - Abnormal; Notable for the following:    Acetaminophen (Tylenol), Serum <10 (*)    All other components within normal limits  CBC - Abnormal; Notable for the following:    WBC 12.4 (*)    MCHC 36.1 (*)    All other components within  normal limits  ETHANOL  SALICYLATE LEVEL  RAPID URINE DRUG SCREEN, HOSP PERFORMED    EKG  EKG Interpretation None       Radiology No results found.  Procedures Procedures (including critical care time)  Medications Ordered in ED Medications - No data to display   Initial Impression / Assessment and Plan / ED Course  I have reviewed the triage vital signs and the nursing notes.  Pertinent labs & imaging results that were available during my care of the patient were reviewed by me and considered in my medical decision making (see chart for details).      Patient Vitals for the past 24 hrs:  BP Temp Temp src Pulse Resp SpO2 Height Weight  05/07/17 0256 (!) 160/90 97.5 F (36.4 C) Oral 100 16 97 % - -  05/06/17 2301 (!) 156/76 98.2 F (36.8 C) Oral 78 16 98 % 5\' 2"  (1.575 m) 56.2 kg (124 lb)    2:25 AM Reevaluation with update and discussion. After initial assessment and treatment, an updated evaluation reveals no change in clinical status.  Patient's IVC paperwork has been rescinded.  Findings discussed with the patient and questions were answered. Cleon Signorelli L    Final Clinical Impressions(s) / ED Diagnoses   Final diagnoses:  Schizoaffective  disorder, unspecified type ([place])   Psychiatric illness, stable without acute unstable psychiatric process such as suicidal or homicidal ideation.  Patient has attention seeking behavior.  There is no indication for hospitalization, medically or psychiatrically at this time.  Nursing Notes Reviewed/ Care Coordinated Applicable Imaging Reviewed Interpretation of Laboratory Data incorporated into ED treatment  The patient appears reasonably screened and/or stabilized for discharge and I doubt any other medical condition or other [facility] requiring further screening, evaluation, or treatment in the ED at this time prior to discharge.  Plan: Home Medications-continue current; Home Treatments-rest, routine psychiatric care; return here if the recommended treatment, does not improve the symptoms; Recommended follow up-PCP and psychiatry as scheduled   New Prescriptions Discharge Medication List as of 05/07/2017  2:33 AM     I personally performed the services described in this documentation, which was scribed in my presence. The recorded information has been reviewed and is accurate.      [name], MD 05/07/17 [phone]

## 2017-05-07 NOTE — ED Notes (Addendum)
Pt continues coming out to the desk stating "you have to have the police take me home.  He told me he would."    Informed pt can provide a bus pass.  Pt stated "But I don't know how to get on the bus."

## 2017-05-07 NOTE — Discharge Instructions (Signed)
Follow-up with your psychiatrist, at [facility] as soon as possible.  Take all of your medications, as prescribed.

## 2017-05-10 ENCOUNTER — Emergency Department (HOSPITAL_COMMUNITY)
Admission: EM | Admit: 2017-05-10 | Discharge: 2017-05-10 | Disposition: A | Discharge status: Home / Self Care | Payer: Medicare Other | Attending: Emergency Medicine | Admitting: Emergency Medicine

## 2017-05-10 ENCOUNTER — Encounter (HOSPITAL_COMMUNITY): Payer: Self-pay

## 2017-05-10 DIAGNOSIS — Z79899 Other long term (current) drug therapy: Secondary | ICD-10-CM | POA: Diagnosis not present

## 2017-05-10 DIAGNOSIS — I10 Essential (primary) hypertension: Secondary | ICD-10-CM | POA: Insufficient documentation

## 2017-05-10 DIAGNOSIS — N179 Acute kidney failure, unspecified: Secondary | ICD-10-CM | POA: Insufficient documentation

## 2017-05-10 DIAGNOSIS — F259 Schizoaffective disorder, unspecified: Secondary | ICD-10-CM | POA: Diagnosis not present

## 2017-05-10 DIAGNOSIS — F17290 Nicotine dependence, other tobacco product, uncomplicated: Secondary | ICD-10-CM | POA: Diagnosis not present

## 2017-05-10 DIAGNOSIS — Z853 Personal history of malignant neoplasm of breast: Secondary | ICD-10-CM | POA: Insufficient documentation

## 2017-05-10 DIAGNOSIS — E039 Hypothyroidism, unspecified: Secondary | ICD-10-CM | POA: Diagnosis not present

## 2017-05-10 DIAGNOSIS — Z915 Personal history of self-harm: Secondary | ICD-10-CM | POA: Diagnosis not present

## 2017-05-10 DIAGNOSIS — E860 Dehydration: Secondary | ICD-10-CM | POA: Diagnosis not present

## 2017-05-10 DIAGNOSIS — J449 Chronic obstructive pulmonary disease, unspecified: Secondary | ICD-10-CM | POA: Insufficient documentation

## 2017-05-10 DIAGNOSIS — F419 Anxiety disorder, unspecified: Secondary | ICD-10-CM | POA: Diagnosis present

## 2017-05-10 DIAGNOSIS — F99 Mental disorder, not otherwise specified: Secondary | ICD-10-CM

## 2017-05-10 DIAGNOSIS — F69 Unspecified disorder of adult personality and behavior: Secondary | ICD-10-CM

## 2017-05-10 LAB — COMPREHENSIVE METABOLIC PANEL
ALK PHOS: 93 U/L (ref 38–126)
ALT: 24 U/L (ref 14–54)
ANION GAP: 12 (ref 5–15)
AST: 26 U/L (ref 15–41)
Albumin: 4.2 g/dL (ref 3.5–5.0)
BUN: 16 mg/dL (ref 6–20)
CALCIUM: 9.5 mg/dL (ref 8.9–10.3)
CO2: 24 mmol/L (ref 22–32)
Chloride: 101 mmol/L (ref 101–111)
Creatinine, Ser: 1.32 mg/dL — ABNORMAL HIGH (ref 0.44–1.00)
GFR calc non Af Amer: 42 mL/min — ABNORMAL LOW (ref >60)
GFR, EST AFRICAN AMERICAN: 49 mL/min — AB (ref >60)
Glucose, Bld: 122 mg/dL — ABNORMAL HIGH (ref 65–99)
POTASSIUM: 3.4 mmol/L — AB (ref 3.5–5.1)
SODIUM: 137 mmol/L (ref 135–145)
Total Bilirubin: 0.5 mg/dL (ref 0.3–1.2)
Total Protein: 7.3 g/dL (ref 6.5–8.1)

## 2017-05-10 LAB — SALICYLATE LEVEL

## 2017-05-10 LAB — CBC
HCT: 38.9 % (ref 36.0–46.0)
HEMOGLOBIN: 13.9 g/dL (ref 12.0–15.0)
MCH: 31.6 pg (ref 26.0–34.0)
MCHC: 35.7 g/dL (ref 30.0–36.0)
MCV: 88.4 fL (ref 78.0–100.0)
Platelets: 395 10*3/uL (ref 150–400)
RBC: 4.4 MIL/uL (ref 3.87–5.11)
RDW: 12.9 % (ref 11.5–15.5)
WBC: 16.9 10*3/uL — ABNORMAL HIGH (ref 4.0–10.5)

## 2017-05-10 LAB — ACETAMINOPHEN LEVEL

## 2017-05-10 LAB — RAPID URINE DRUG SCREEN, HOSP PERFORMED
Amphetamines: NOT DETECTED
Barbiturates: NOT DETECTED
Benzodiazepines: NOT DETECTED
COCAINE: NOT DETECTED
OPIATES: NOT DETECTED
TETRAHYDROCANNABINOL: NOT DETECTED

## 2017-05-10 LAB — ETHANOL: Alcohol, Ethyl (B): <5 mg/dL (ref <5)

## 2017-05-10 NOTE — ED Provider Notes (Signed)
[facility] DEPT Provider Note   CSN: [id number] Arrival date & time: 05/10/17  0351     History   Chief Complaint Chief Complaint  Patient presents with  . Medical Clearance    HPI Audrey Meyer is a 61 y.o. female.  61 year old female with a history of COPD, dyslipidemia, hypertension, hypothyroid, and schizoaffective disorder presents voluntarily with [employer]. Patient states that she is now wanting to go home. She states that she asked the police to bring her here because she is wanted to "make it stop". Patient will not elaborate on what this means. When further asked how we can help her she responds, "I don't know". She specifically denies SI and HI. The patient will not answer any additional questions. She continually paces around the department.   The history is provided by the patient. No language interpreter was used.    Past Medical History:  Diagnosis Date  . Acid reflux   . Breast cancer ([place])   . COPD (chronic obstructive pulmonary disease) ([place])   . High cholesterol   . Hypertension   . Hypothyroid   . Schizoaffective disorder     Patient Active Problem List   Diagnosis Date Noted  . Suicide attempt ([place]) 04/25/2017  . Hypotension due to drugs 04/25/2017  . Drug overdose 04/24/2017  . Hyponatremia 01/31/2017  . Hypomagnesemia 01/31/2017  . Major depressive disorder, recurrent episode, moderate ([place]) 04/28/2016  . Chronic obstructive pulmonary disease ([place])   . Hypokalemia 03/07/2016  . Hypothyroidism 03/07/2016  . Schizoaffective disorder, depressive type ([place]) 03/07/2016  . Essential hypertension 03/07/2016  . Tobacco abuse 03/07/2016    Past Surgical History:  Procedure Laterality Date  . APPENDECTOMY    . MASTECTOMY, PARTIAL    . TUBAL LIGATION      OB History    No data available       Home Medications    Prior to Admission medications   Medication Sig Start Date End Date Taking? Authorizing Provider  amLODipine (NORVASC) 10 MG  tablet Take 10 mg by mouth daily. 04/17/17   [provider]  atorvastatin (LIPITOR) 20 MG tablet Take 1 tablet (20 mg total) by mouth at bedtime. 05/05/16   Withrow, Elyse Jarvis, FNP  furosemide (LASIX) 20 MG tablet Take 20 mg by mouth daily. 05/01/17   [provider]  gabapentin (NEURONTIN) 300 MG capsule Take 300 mg by mouth 3 (three) times daily.     [provider]  hydrOXYzine (VISTARIL) 25 MG capsule Take 25 mg by mouth daily. 05/05/17   [provider]  levothyroxine (SYNTHROID, LEVOTHROID) 125 MCG tablet Take 125 mcg by mouth daily before breakfast.     [provider]  lisinopril (PRINIVIL,ZESTRIL) 10 MG tablet Take 10 mg by mouth daily.    [provider]  magnesium oxide (MAG-OX) 400 MG tablet Take 2 tablets (800 mg total) by mouth 2 (two) times daily. 02/03/17   Alfonzo Beers, MD  mirtazapine (REMERON) 15 MG tablet Take 15 mg by mouth at bedtime. 05/05/17   [provider]  Multiple Vitamin (MULTIVITAMIN WITH MINERALS) TABS tablet Take 1 tablet by mouth daily.    [provider]  potassium chloride SA (K-DUR,KLOR-CON) 20 MEQ tablet Take 20 mEq by mouth 2 (two) times daily.     [provider]  rOPINIRole (REQUIP) 1 MG tablet Take 1 mg by mouth at bedtime.    [provider]  sertraline (ZOLOFT) 100 MG tablet Take 100 mg by mouth daily.  05/05/17   [provider]  thiothixene (NAVANE) 5 MG capsule Take 5 mg by mouth at bedtime. 05/05/17   [provider]  zolpidem (AMBIEN) 10 MG tablet Take 10 mg by mouth at bedtime. 04/11/17   [provider]    Family History Family History  Problem Relation Age of Onset  . Heart attack Father   . Hypertension Father   . Cancer Other     Social History Social History  Substance Use Topics  . Smoking status: Current Every Day Smoker    Packs/day: 0.00    Years: 2.00    Types: Cigars  . Smokeless tobacco: Never Used  . Alcohol use No      Allergies   Budesonide and Promethazine   Review of Systems Review of Systems  Unable to perform ROS: Psychiatric disorder     Physical Exam Updated Vital Signs BP 130/83 (BP Location: Left Arm)   Pulse (!) 115   Temp 98.2 F (36.8 C) (Oral)   Resp 20   SpO2 96%   Physical Exam  Constitutional: She is oriented to person, place, and time. She appears well-developed and well-nourished. No distress.  Nontoxic appearing.   HENT:  Head: Normocephalic and atraumatic.  Eyes: Conjunctivae and EOM are normal. No scleral icterus.  Neck: Normal range of motion.  Pulmonary/Chest: Effort normal. No respiratory distress.  Musculoskeletal: Normal range of motion.  Neurological: She is alert and oriented to person, place, and time. She exhibits normal muscle tone. Coordination normal.  GCS 15. Ambulatory with steady gait.  Skin: Skin is warm and dry. No rash noted. She is not diaphoretic. No erythema. No pallor.  Psychiatric: Her speech is normal. Her mood appears anxious. She is withdrawn. She expresses no homicidal and no suicidal ideation.  Flat affect. Denies SI/HI.  Nursing note and vitals reviewed.    ED Treatments / Results  Labs (all labs ordered are listed, but only abnormal results are displayed) Labs Reviewed  COMPREHENSIVE METABOLIC PANEL - Abnormal; Notable for the following:       Result Value   Potassium 3.4 (*)    Glucose, Bld 122 (*)    Creatinine, Ser 1.32 (*)    GFR calc non Af Amer 42 (*)    GFR calc Af Amer 49 (*)    All other components within normal limits  ACETAMINOPHEN LEVEL - Abnormal; Notable for the following:    Acetaminophen (Tylenol), Serum <10 (*)    All other components within normal limits  CBC - Abnormal; Notable for the following:    WBC 16.9 (*)    All other components within normal limits  ETHANOL  SALICYLATE LEVEL  RAPID URINE DRUG SCREEN, HOSP PERFORMED    EKG  EKG Interpretation None       Radiology No results  found.  Procedures Procedures (including critical care time)  Medications Ordered in ED Medications - No data to display   Initial Impression / Assessment and Plan / ED Course  I have reviewed the triage vital signs and the nursing notes.  Pertinent labs & imaging results that were available during my care of the patient were reviewed by me and considered in my medical decision making (see chart for details).     61 year old female with a history of schizoaffective disorder presents to the emergency department with [employer]. She requested to be brought to the department, but now desires to leave. She denies suicidal and homicidal thoughts. She has been seen multiple times in  the past week. The patient does not appear acutely ill. Her labs to suggest potential dehydration and mild AKI. I do not believe this requires inpatient management or emergent fluids at this time. Anion gap normal. The patient has been encouraged to drink plenty of clear liquids. Primary care follow-up advised in return precautions given. Patient discharged in stable condition with no unaddressed concerns.   Final Clinical Impressions(s) / ED Diagnoses   Final diagnoses:  AKI (acute kidney injury) [facility])  Dehydration  Psychiatric complaint    New Prescriptions New Prescriptions   No medications on file     Antonietta Breach, Hershal Coria 05/10/17 [id number]    Ripley Fraise, MD 05/10/17 [phone]

## 2017-05-10 NOTE — ED Notes (Signed)
Pt asked me if I was a Engineer, structural.

## 2017-05-10 NOTE — Discharge Instructions (Signed)
Drink plenty of water to prevent dehydration. Continue taking your daily medications. Follow-up with your primary care doctor regarding your visit today.

## 2017-05-10 NOTE — ED Notes (Addendum)
Pt arrived here with [employer] voluntarily. Pt pacing restlessly around, when questioned about how we can help her, pt is repeatedly stating "I don't know". Pt denies wanting to hurt herself or others. She states "make it stop" She will not elaborate.

## 2017-10-18 ENCOUNTER — Other Ambulatory Visit: Payer: Self-pay | Admitting: Endocrinology

## 2017-10-18 DIAGNOSIS — Z1231 Encounter for screening mammogram for malignant neoplasm of breast: Secondary | ICD-10-CM

## 2017-10-24 ENCOUNTER — Other Ambulatory Visit: Payer: Self-pay | Admitting: Family Medicine

## 2017-10-24 DIAGNOSIS — Z1231 Encounter for screening mammogram for malignant neoplasm of breast: Secondary | ICD-10-CM

## 2017-11-22 ENCOUNTER — Ambulatory Visit
Admission: RE | Admit: 2017-11-22 | Discharge: 2017-11-22 | Disposition: A | Discharge status: Home / Self Care | Payer: Medicare Other | Source: Ambulatory Visit | Attending: Endocrinology | Admitting: Endocrinology

## 2017-11-22 DIAGNOSIS — Z1231 Encounter for screening mammogram for malignant neoplasm of breast: Secondary | ICD-10-CM

## 2019-06-19 ENCOUNTER — Other Ambulatory Visit: Payer: Self-pay

## 2019-06-19 ENCOUNTER — Other Ambulatory Visit: Payer: Self-pay | Admitting: Family Medicine

## 2019-06-19 DIAGNOSIS — Z1231 Encounter for screening mammogram for malignant neoplasm of breast: Secondary | ICD-10-CM

## 2019-08-07 ENCOUNTER — Ambulatory Visit
Admission: RE | Admit: 2019-08-07 | Discharge: 2019-08-07 | Disposition: A | Discharge status: Home / Self Care | Payer: Medicare Other | Source: Ambulatory Visit | Attending: Family Medicine | Admitting: Family Medicine

## 2019-08-07 ENCOUNTER — Other Ambulatory Visit: Payer: Self-pay

## 2019-08-07 DIAGNOSIS — Z1231 Encounter for screening mammogram for malignant neoplasm of breast: Secondary | ICD-10-CM

## 2020-01-21 IMAGING — MG MM DIGITAL SCREENING BILAT W/ TOMO W/ CAD
8 of 14 series · 9 of 40 positions shown · non-contrast
Comparison: Previous exam(s).

CLINICAL DATA: Screening.

EXAM:
DIGITAL SCREENING BILATERAL MAMMOGRAM WITH TOMO AND CAD

[L MLO synth-2D (1 of 2)]
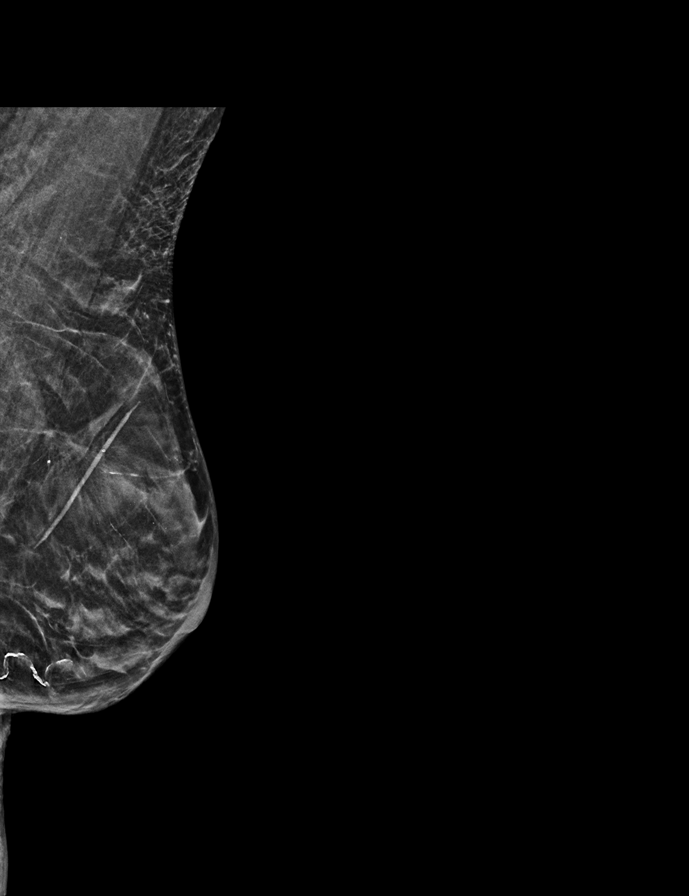

[L CC synth-2D (1 of 2)]
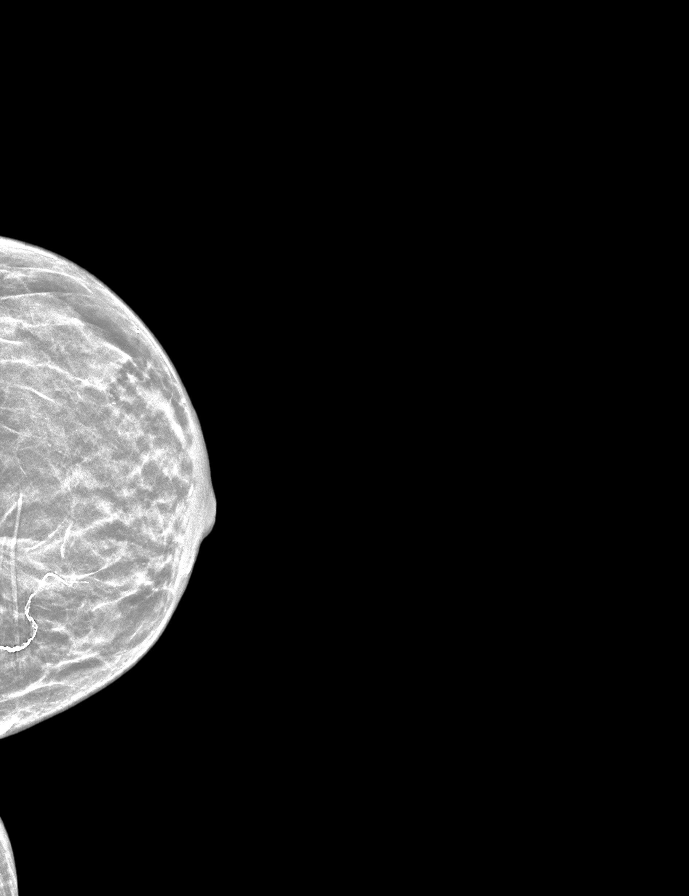

[L CC synth-2D (2 of 2)]
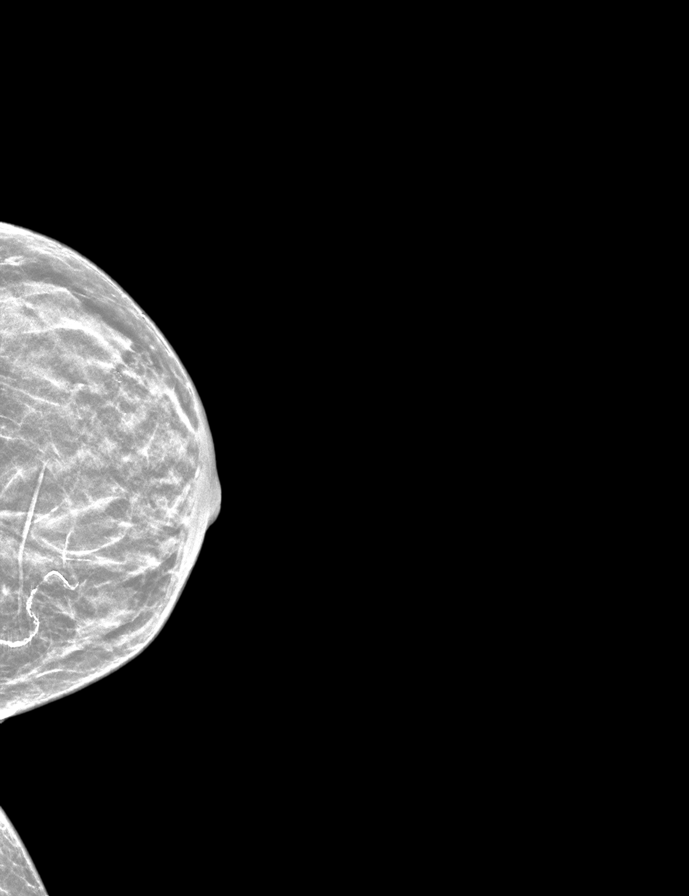

[L MLO synth-2D (2 of 2)]
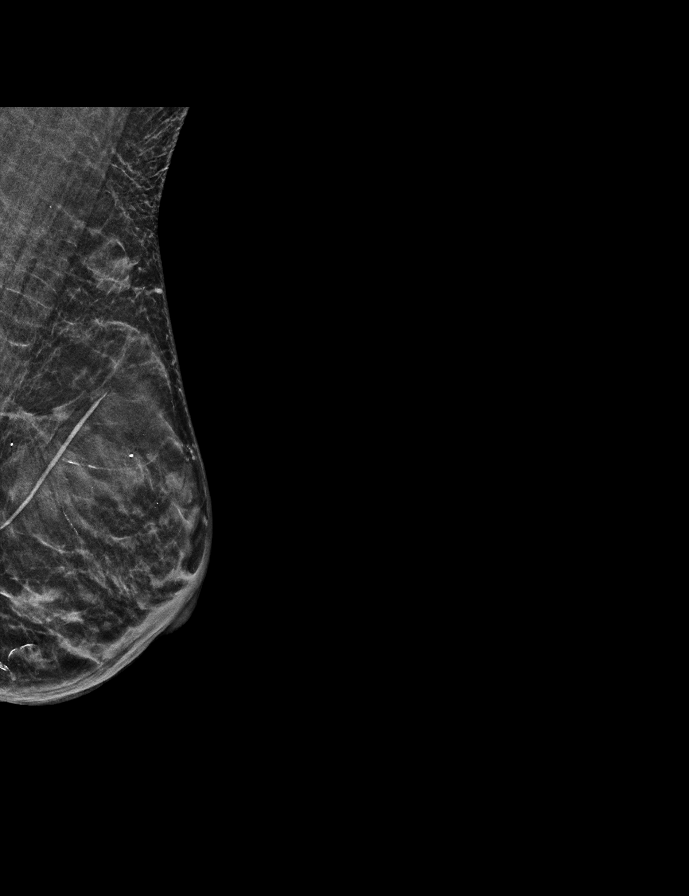

[R MLO synth-2D (1 of 2)]
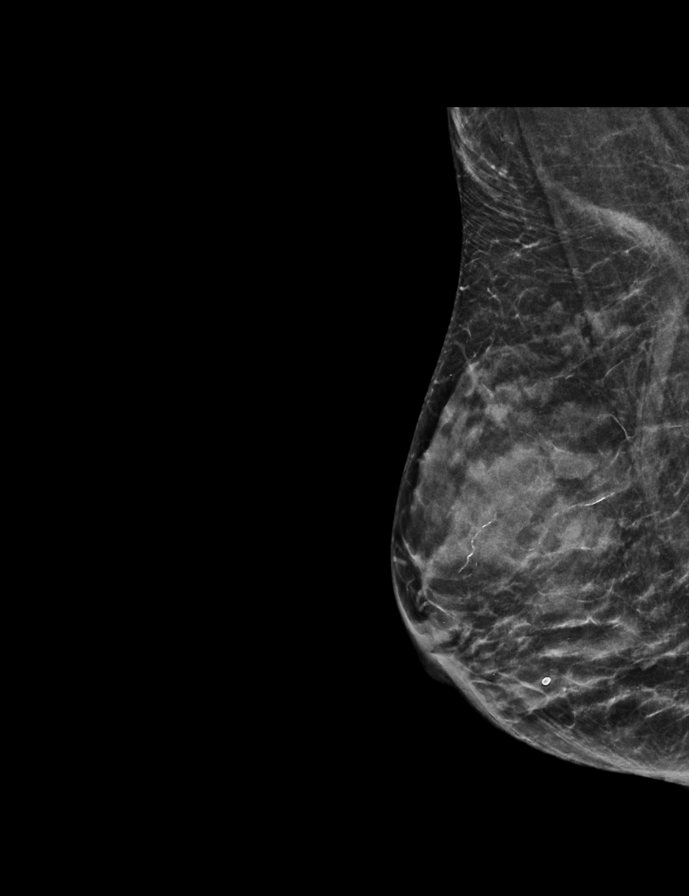

[R CC synth-2D]
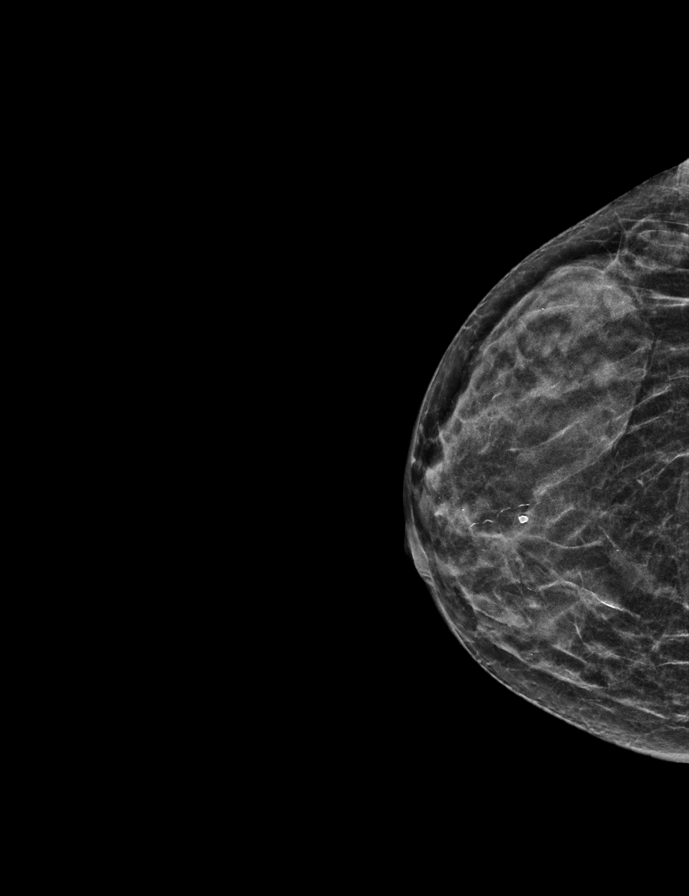

[R MLO synth-2D (2 of 2)]
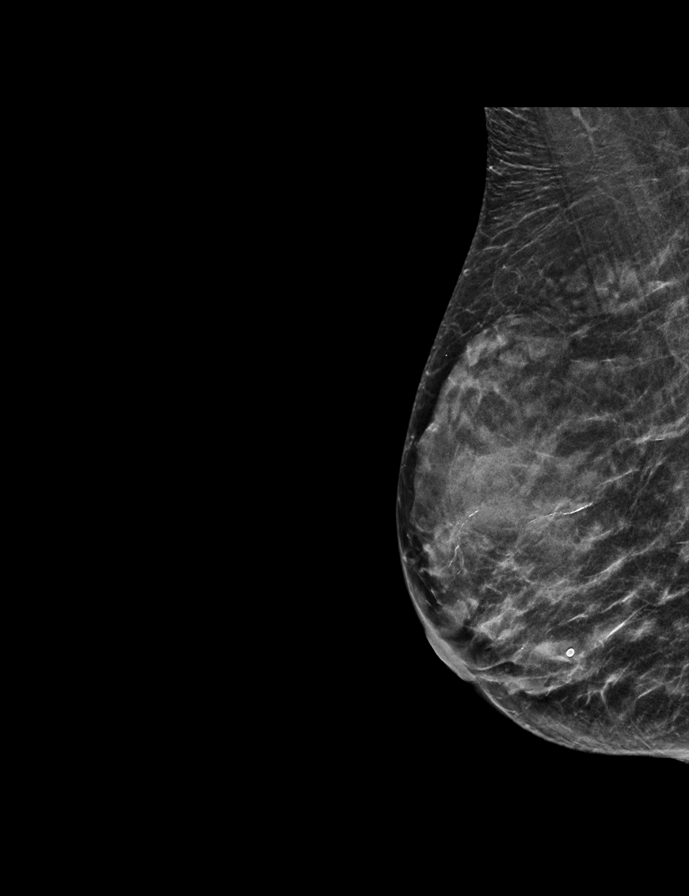

[L CC tomo · 2 of 27 frames shown]
[frame 9/27]
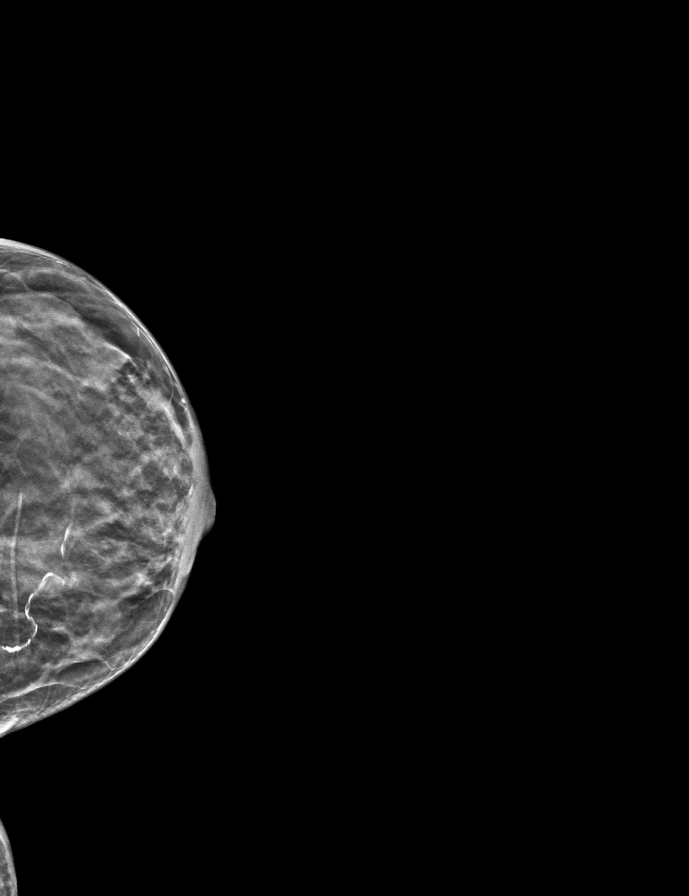
[frame 14/27]
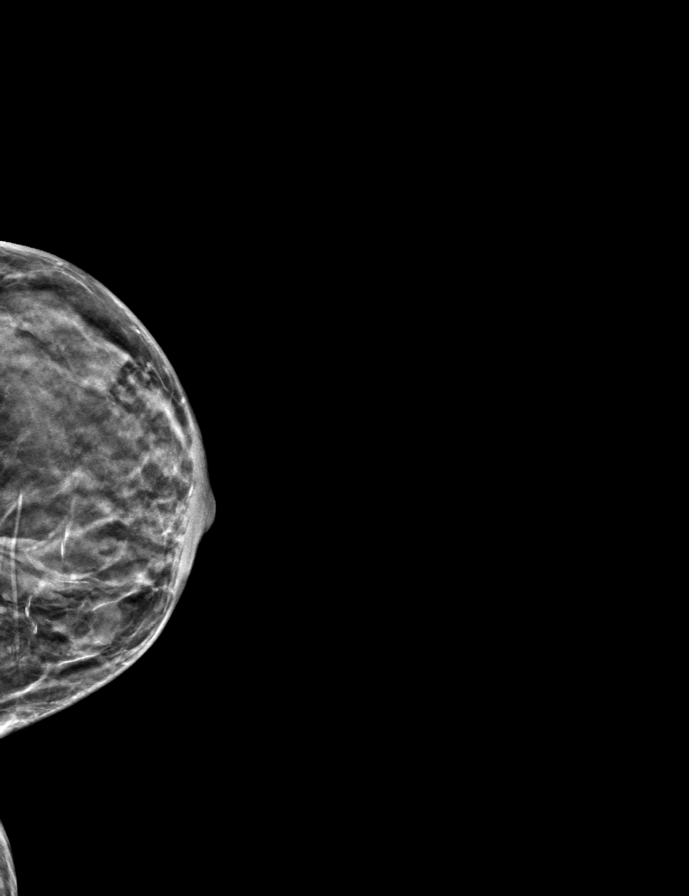

[9 of 40 positions shown; findings below may reference images not displayed]

ACR Breast Density Category c: The breast tissue is heterogeneously
dense, which may obscure small masses.
FINDINGS: There are no findings suspicious for malignancy. Images were
processed with CAD.
IMPRESSION: No mammographic evidence of malignancy. A result letter of this
screening mammogram will be mailed directly to the patient.

RECOMMENDATION:
Screening mammogram in one year. (Code:[id number])

BI-RADS CATEGORY  1: Negative.

## 2021-11-16 ENCOUNTER — Encounter (HOSPITAL_COMMUNITY): Payer: Self-pay

## 2021-11-16 ENCOUNTER — Other Ambulatory Visit: Payer: Self-pay

## 2021-11-16 ENCOUNTER — Emergency Department (HOSPITAL_COMMUNITY)
Admission: EM | Admit: 2021-11-16 | Discharge: 2021-11-16 | Disposition: A | Discharge status: Home / Self Care | Payer: Medicare PPO | Attending: Emergency Medicine | Admitting: Emergency Medicine

## 2021-11-16 ENCOUNTER — Emergency Department (HOSPITAL_COMMUNITY): Payer: Medicare PPO

## 2021-11-16 DIAGNOSIS — J441 Chronic obstructive pulmonary disease with (acute) exacerbation: Secondary | ICD-10-CM | POA: Insufficient documentation

## 2021-11-16 DIAGNOSIS — E039 Hypothyroidism, unspecified: Secondary | ICD-10-CM | POA: Insufficient documentation

## 2021-11-16 DIAGNOSIS — Z20822 Contact with and (suspected) exposure to covid-19: Secondary | ICD-10-CM | POA: Insufficient documentation

## 2021-11-16 DIAGNOSIS — F17210 Nicotine dependence, cigarettes, uncomplicated: Secondary | ICD-10-CM | POA: Insufficient documentation

## 2021-11-16 DIAGNOSIS — I10 Essential (primary) hypertension: Secondary | ICD-10-CM | POA: Diagnosis not present

## 2021-11-16 DIAGNOSIS — Z79899 Other long term (current) drug therapy: Secondary | ICD-10-CM | POA: Insufficient documentation

## 2021-11-16 DIAGNOSIS — Z853 Personal history of malignant neoplasm of breast: Secondary | ICD-10-CM | POA: Diagnosis not present

## 2021-11-16 DIAGNOSIS — R0602 Shortness of breath: Secondary | ICD-10-CM | POA: Diagnosis present

## 2021-11-16 LAB — URINALYSIS, ROUTINE W REFLEX MICROSCOPIC
Bilirubin Urine: NEGATIVE
Glucose, UA: NEGATIVE mg/dL
Hgb urine dipstick: NEGATIVE
Ketones, ur: NEGATIVE mg/dL
Leukocytes,Ua: NEGATIVE
Nitrite: NEGATIVE
Protein, ur: NEGATIVE mg/dL
Specific Gravity, Urine: 1.014 (ref 1.005–1.030)
pH: 6 (ref 5.0–8.0)

## 2021-11-16 LAB — COMPREHENSIVE METABOLIC PANEL
ALT: 21 U/L (ref 0–44)
AST: 25 U/L (ref 15–41)
Albumin: 3.8 g/dL (ref 3.5–5.0)
Alkaline Phosphatase: 65 U/L (ref 38–126)
Anion gap: 4 — ABNORMAL LOW (ref 5–15)
BUN: 19 mg/dL (ref 8–23)
CO2: 34 mmol/L — ABNORMAL HIGH (ref 22–32)
Calcium: 7.8 mg/dL — ABNORMAL LOW (ref 8.9–10.3)
Chloride: 102 mmol/L (ref 98–111)
Creatinine, Ser: 0.85 mg/dL (ref 0.44–1.00)
GFR, Estimated: >60 mL/min (ref >60)
Glucose, Bld: 124 mg/dL — ABNORMAL HIGH (ref 70–99)
Potassium: 3.3 mmol/L — ABNORMAL LOW (ref 3.5–5.1)
Sodium: 140 mmol/L (ref 135–145)
Total Bilirubin: 0.8 mg/dL (ref 0.3–1.2)
Total Protein: 6.9 g/dL (ref 6.5–8.1)

## 2021-11-16 LAB — CBC WITH DIFFERENTIAL/PLATELET
Abs Immature Granulocytes: 0.03 10*3/uL (ref 0.00–0.07)
Basophils Absolute: 0.1 10*3/uL (ref 0.0–0.1)
Basophils Relative: 1 %
Eosinophils Absolute: 0.3 10*3/uL (ref 0.0–0.5)
Eosinophils Relative: 4 %
HCT: 36.3 % (ref 36.0–46.0)
Hemoglobin: 11.7 g/dL — ABNORMAL LOW (ref 12.0–15.0)
Immature Granulocytes: 0 %
Lymphocytes Relative: 20 %
Lymphs Abs: 1.8 10*3/uL (ref 0.7–4.0)
MCH: 30.8 pg (ref 26.0–34.0)
MCHC: 32.2 g/dL (ref 30.0–36.0)
MCV: 95.5 fL (ref 80.0–100.0)
Monocytes Absolute: 0.8 10*3/uL (ref 0.1–1.0)
Monocytes Relative: 9 %
Neutro Abs: 5.9 10*3/uL (ref 1.7–7.7)
Neutrophils Relative %: 66 %
Platelets: 193 10*3/uL (ref 150–400)
RBC: 3.8 MIL/uL — ABNORMAL LOW (ref 3.87–5.11)
RDW: 13.1 % (ref 11.5–15.5)
WBC: 9 10*3/uL (ref 4.0–10.5)
nRBC: 0 % (ref 0.0–0.2)

## 2021-11-16 LAB — RESP PANEL BY RT-PCR (FLU A&B, COVID) ARPGX2
Influenza A by PCR: NEGATIVE
Influenza B by PCR: NEGATIVE
SARS Coronavirus 2 by RT PCR: NEGATIVE

## 2021-11-16 MED ORDER — ALBUTEROL SULFATE HFA 108 (90 BASE) MCG/ACT IN AERS: 2.0000 | INHALATION_SPRAY | RESPIRATORY_TRACT | 2 refills | Status: AC | PRN

## 2021-11-16 MED ORDER — DOXYCYCLINE HYCLATE 100 MG PO CAPS: 100.0000 mg | ORAL_CAPSULE | Freq: Two times a day (BID) | ORAL | 0 refills | Status: DC

## 2021-11-16 MED ORDER — PREDNISONE 20 MG PO TABS: 40.0000 mg | ORAL_TABLET | Freq: Every day | ORAL | 0 refills | Status: DC

## 2021-11-16 MED ORDER — ALBUTEROL SULFATE (2.5 MG/3ML) 0.083% IN NEBU
10.0000 mg | INHALATION_SOLUTION | RESPIRATORY_TRACT | Status: AC
  Administered 2021-11-16: 10 mg via RESPIRATORY_TRACT
  Filled 2021-11-16: qty 12

## 2021-11-16 NOTE — ED Provider Notes (Signed)
[facility] DEPT Provider Note   CSN: [id number] Arrival date & time: 11/16/21  0035     History Chief Complaint  Patient presents with   Shortness of Breath    Audrey Meyer is a 65 y.o. female.  Patient presents to the emergency department for evaluation of shortness of breath.  Patient reports a history of COPD.  She reports that she has been experiencing increased shortness of breath for several days.  Patient reports that she was treated last month for the same.  She reports steroids and antibiotics at home.      Past Medical History:  Diagnosis Date   Acid reflux    Breast cancer ([facility])    COPD (chronic obstructive pulmonary disease) ([facility])    High cholesterol    Hypertension    Hypothyroid    Schizoaffective disorder     Patient Active Problem List   Diagnosis Date Noted   Suicide attempt ([place]) 04/25/2017   Hypotension due to drugs 04/25/2017   Drug overdose 04/24/2017   Hyponatremia 01/31/2017   Hypomagnesemia 01/31/2017   Major depressive disorder, recurrent episode, moderate ([place]) 04/28/2016   Chronic obstructive pulmonary disease ([place])    Hypokalemia 03/07/2016   Hypothyroidism 03/07/2016   Schizoaffective disorder, depressive type ([place]) 03/07/2016   Essential hypertension 03/07/2016   Tobacco abuse 03/07/2016    Past Surgical History:  Procedure Laterality Date   APPENDECTOMY     BREAST LUMPECTOMY Left 2004   MASTECTOMY, PARTIAL     TUBAL LIGATION       OB History   No obstetric history on file.     Family History  Problem Relation Age of Onset   Heart attack Father    Hypertension Father    Cancer Other     Social History   Tobacco Use   Smoking status: Every Day    Packs/day: 0.00    Years: 2.00    Pack years: 0.00    Types: Cigars, Cigarettes   Smokeless tobacco: Never  Substance Use Topics   Alcohol use: No   Drug use: No    Home Medications Prior to Admission medications   Medication  Sig Start Date End Date Taking? Authorizing Provider  albuterol (VENTOLIN HFA) 108 (90 Base) MCG/ACT inhaler Inhale 2 puffs into the lungs every 4 (four) hours as needed for wheezing or shortness of breath. 11/16/21  Yes Ahlaya Ende, Gwenyth Allegra, MD  doxycycline (VIBRAMYCIN) 100 MG capsule Take 1 capsule (100 mg total) by mouth 2 (two) times daily. 11/16/21  Yes Yaeli Hartung, Gwenyth Allegra, MD  predniSONE (DELTASONE) 20 MG tablet Take 2 tablets (40 mg total) by mouth daily with breakfast. 11/16/21  Yes Melondy Blanchard, Gwenyth Allegra, MD  amLODipine (NORVASC) 10 MG tablet Take 10 mg by mouth daily. 04/17/17   [provider]  atorvastatin (LIPITOR) 20 MG tablet Take 1 tablet (20 mg total) by mouth at bedtime. 05/05/16   Withrow, Elyse Jarvis, FNP  furosemide (LASIX) 20 MG tablet Take 20 mg by mouth daily. 05/01/17   [provider]  gabapentin (NEURONTIN) 300 MG capsule Take 300 mg by mouth 3 (three) times daily.     [provider]  hydrOXYzine (VISTARIL) 25 MG capsule Take 25 mg by mouth daily. 05/05/17   [provider]  levothyroxine (SYNTHROID, LEVOTHROID) 125 MCG tablet Take 125 mcg by mouth daily before breakfast.     [provider]  lisinopril (PRINIVIL,ZESTRIL) 10 MG tablet Take 10 mg by mouth daily.  [provider]  magnesium oxide (MAG-OX) 400 MG tablet Take 2 tablets (800 mg total) by mouth 2 (two) times daily. 02/03/17   Mabe, Forbes Cellar, MD  mirtazapine (REMERON) 15 MG tablet Take 15 mg by mouth at bedtime. 05/05/17   [provider]  Multiple Vitamin (MULTIVITAMIN WITH MINERALS) TABS tablet Take 1 tablet by mouth daily.    [provider]  potassium chloride SA (K-DUR,KLOR-CON) 20 MEQ tablet Take 20 mEq by mouth 2 (two) times daily.     [provider]  rOPINIRole (REQUIP) 1 MG tablet Take 1 mg by mouth at bedtime.    [provider]  sertraline (ZOLOFT) 100 MG tablet Take 100 mg by mouth daily. 05/05/17   [provider]  thiothixene (NAVANE) 5 MG capsule Take 5 mg by mouth at bedtime. 05/05/17   [provider]  zolpidem (AMBIEN) 10 MG tablet Take 10 mg by mouth at bedtime. 04/11/17   [provider]    Allergies    Budesonide and Promethazine  Review of Systems   Review of Systems  Respiratory:  Positive for cough and shortness of breath.   All other systems reviewed and are negative.  Physical Exam Updated Vital Signs BP (!) 147/125   Pulse 88   Temp 98.1 F (36.7 C)   Resp (!) 23   SpO2 91%   Physical Exam Vitals and nursing note reviewed.  Constitutional:      General: She is not in acute distress.    Appearance: Normal appearance. She is well-developed.  HENT:     Head: Normocephalic and atraumatic.     Right Ear: Hearing normal.     Left Ear: Hearing normal.     Nose: Nose normal.  Eyes:     Conjunctiva/sclera: Conjunctivae normal.     Pupils: Pupils are equal, round, and reactive to light.  Cardiovascular:     Rate and Rhythm: Regular rhythm.     Heart sounds: S1 normal and S2 normal. No murmur heard.   No friction rub. No gallop.  Pulmonary:     Effort: Pulmonary effort is normal. No respiratory distress.     Breath sounds: Decreased breath sounds and wheezing present.  Chest:     Chest wall: No tenderness.  Abdominal:     General: Bowel sounds are normal.     Palpations: Abdomen is soft.     Tenderness: There is no abdominal tenderness. There is no guarding or rebound. Negative signs include Murphy's sign and McBurney's sign.     Hernia: No hernia is present.  Musculoskeletal:        General: Normal range of motion.     Cervical back: Normal range of motion and neck supple.  Skin:    General: Skin is warm and dry.     Findings: No rash.  Neurological:     Mental Status: She is alert and oriented to person, place, and time.     GCS: GCS eye subscore is 4. GCS verbal subscore is 5. GCS motor subscore is 6.     Cranial Nerves: No cranial nerve deficit.      Sensory: No sensory deficit.     Coordination: Coordination normal.  Psychiatric:        Speech: Speech normal.        Behavior: Behavior normal.        Thought Content: Thought content normal.    ED Results / Procedures / Treatments   Labs (all labs ordered  are listed, but only abnormal results are displayed) Labs Reviewed  CBC WITH DIFFERENTIAL/PLATELET - Abnormal; Notable for the following components:      Result Value   RBC 3.80 (*)    Hemoglobin 11.7 (*)    All other components within normal limits  URINALYSIS, ROUTINE W REFLEX MICROSCOPIC - Abnormal; Notable for the following components:   Color, Urine STRAW (*)    All other components within normal limits  COMPREHENSIVE METABOLIC PANEL - Abnormal; Notable for the following components:   Potassium 3.3 (*)    CO2 34 (*)    Glucose, Bld 124 (*)    Calcium 7.8 (*)    Anion gap 4 (*)    All other components within normal limits  RESP PANEL BY RT-PCR (FLU A&B, COVID) [id number]    EKG None  Radiology DG Chest Portable 1 View  Result Date: 11/16/2021 CLINICAL DATA:  Worsening shortness of breath. EXAM: PORTABLE CHEST 1 VIEW COMPARISON:  AP Lat chest 04/28/2016. FINDINGS: There is interval low inspiration. Interstitial haziness in the lung bases is seen and could be due to low lung volumes or early pneumonitis. The remaining lungs clear with COPD change. There are calcifications in the aortic arch. The mediastinum is normally outlined. The heart is slightly enlarged. No vascular congestion is seen. Mild osteopenia and thoracic spondylosis. IMPRESSION: Low inspiration exam with hazy atelectasis or infiltrate in the lung bases. PA and lateral study in full inspiration is recommended. Electronically Signed   By: Telford Nab M.D.   On: 11/16/2021 01:21    Procedures Procedures   Medications Ordered in ED Medications  albuterol (PROVENTIL) (2.5 MG/3ML) 0.083% nebulizer solution 10 mg (10 mg Nebulization Given 11/16/21 0300)     ED Course  I have reviewed the triage vital signs and the nursing notes.  Pertinent labs & imaging results that were available during my care of the patient were reviewed by me and considered in my medical decision making (see chart for details).    MDM Rules/Calculators/A&P                          Patient with history of oxygen dependent COPD presents to the emergency department with cough and increased shortness of breath.  Patient actively wheezing at arrival.  She has received bronchodilator therapy and Solu-Medrol by EMS during transport.  Patient is normally on 4 L by nasal cannula, is maintaining her oxygen saturations on her normal oxygen level.  Patient placed on a continuous nebulizer treatment here in the department and has improved.  She has very minimal wheezing with good air movement, maintaining her oxygen saturations.  COVID and influenza are negative.  Nonspecific findings on chest x-ray, will cover empirically with antibiotics.  Given return precautions.   Final Clinical Impression(s) / ED Diagnoses Final diagnoses:  COPD exacerbation ([place])    Rx / DC Orders ED Discharge Orders          Ordered    predniSONE (DELTASONE) 20 MG tablet  Daily with breakfast        11/16/21 0611    albuterol (VENTOLIN HFA) 108 (90 Base) MCG/ACT inhaler  Every 4 hours PRN        11/16/21 0611    doxycycline (VIBRAMYCIN) 100 MG capsule  2 times daily        11/16/21 0611             Orpah Greek, MD 11/16/21 [phone]

## 2021-11-16 NOTE — ED Notes (Signed)
Pt assisted to using BSC, pt became SHOB, pt remained 91% on 4L [place]. Pt wishing to speak to Dr. Betsey Holiday before d/c due to becoming [facility] with activity. Dr. Betsey Holiday made aware.

## 2021-11-16 NOTE — ED Notes (Signed)
Dr. Betsey Holiday to bedside to discuss d/c w/ pt.

## 2021-11-16 NOTE — ED Notes (Signed)
Dr. Pollina at bedside   

## 2021-11-16 NOTE — ED Triage Notes (Addendum)
Pt BIB EMS. Pt reports with [facility] over the past week that has gotten worse today. Pt is on 4L O2 per baseline.   Duoneb given  125 mg solumedrol 20 g left ac

## 2021-11-25 ENCOUNTER — Encounter (HOSPITAL_COMMUNITY): Payer: Self-pay | Admitting: Internal Medicine

## 2021-11-25 ENCOUNTER — Other Ambulatory Visit: Payer: Self-pay

## 2021-11-25 ENCOUNTER — Emergency Department (HOSPITAL_COMMUNITY): Payer: Medicare PPO

## 2021-11-25 ENCOUNTER — Observation Stay (HOSPITAL_COMMUNITY)
Admission: EM | Admit: 2021-11-25 | Discharge: 2021-11-26 | Disposition: A | Discharge status: Home / Self Care | Payer: Medicare PPO | Attending: Family Medicine | Admitting: Family Medicine

## 2021-11-25 DIAGNOSIS — F17210 Nicotine dependence, cigarettes, uncomplicated: Secondary | ICD-10-CM | POA: Insufficient documentation

## 2021-11-25 DIAGNOSIS — E039 Hypothyroidism, unspecified: Secondary | ICD-10-CM | POA: Diagnosis present

## 2021-11-25 DIAGNOSIS — Z720 Tobacco use: Secondary | ICD-10-CM | POA: Diagnosis present

## 2021-11-25 DIAGNOSIS — Z9981 Dependence on supplemental oxygen: Secondary | ICD-10-CM

## 2021-11-25 DIAGNOSIS — J9611 Chronic respiratory failure with hypoxia: Secondary | ICD-10-CM

## 2021-11-25 DIAGNOSIS — Z20822 Contact with and (suspected) exposure to covid-19: Secondary | ICD-10-CM | POA: Insufficient documentation

## 2021-11-25 DIAGNOSIS — J9612 Chronic respiratory failure with hypercapnia: Secondary | ICD-10-CM

## 2021-11-25 DIAGNOSIS — K219 Gastro-esophageal reflux disease without esophagitis: Secondary | ICD-10-CM | POA: Insufficient documentation

## 2021-11-25 DIAGNOSIS — R0602 Shortness of breath: Secondary | ICD-10-CM | POA: Diagnosis present

## 2021-11-25 DIAGNOSIS — J441 Chronic obstructive pulmonary disease with (acute) exacerbation: Principal | ICD-10-CM | POA: Insufficient documentation

## 2021-11-25 DIAGNOSIS — I10 Essential (primary) hypertension: Secondary | ICD-10-CM | POA: Diagnosis not present

## 2021-11-25 DIAGNOSIS — Z79899 Other long term (current) drug therapy: Secondary | ICD-10-CM | POA: Diagnosis not present

## 2021-11-25 DIAGNOSIS — Z853 Personal history of malignant neoplasm of breast: Secondary | ICD-10-CM | POA: Insufficient documentation

## 2021-11-25 DIAGNOSIS — F17200 Nicotine dependence, unspecified, uncomplicated: Secondary | ICD-10-CM | POA: Insufficient documentation

## 2021-11-25 DIAGNOSIS — E669 Obesity, unspecified: Secondary | ICD-10-CM

## 2021-11-25 DIAGNOSIS — E876 Hypokalemia: Secondary | ICD-10-CM | POA: Diagnosis not present

## 2021-11-25 DIAGNOSIS — F251 Schizoaffective disorder, depressive type: Secondary | ICD-10-CM | POA: Diagnosis present

## 2021-11-25 LAB — RESP PANEL BY RT-PCR (FLU A&B, COVID) ARPGX2
Influenza A by PCR: NEGATIVE
Influenza B by PCR: NEGATIVE
SARS Coronavirus 2 by RT PCR: NEGATIVE

## 2021-11-25 LAB — BLOOD GAS, VENOUS
Acid-Base Excess: 8.9 mmol/L — ABNORMAL HIGH (ref 0.0–2.0)
Bicarbonate: 37.7 mmol/L — ABNORMAL HIGH (ref 20.0–28.0)
O2 Saturation: 60.8 %
Patient temperature: 98.6
pCO2, Ven: 78.1 mmHg (ref 44.0–60.0)
pH, Ven: 7.305 (ref 7.250–7.430)
pO2, Ven: 33.3 mmHg (ref 32.0–45.0)

## 2021-11-25 LAB — CBC WITH DIFFERENTIAL/PLATELET
Abs Immature Granulocytes: 0.05 10*3/uL (ref 0.00–0.07)
Basophils Absolute: 0.1 10*3/uL (ref 0.0–0.1)
Basophils Relative: 1 %
Eosinophils Absolute: 0.5 10*3/uL (ref 0.0–0.5)
Eosinophils Relative: 4 %
HCT: 37.2 % (ref 36.0–46.0)
Hemoglobin: 12.1 g/dL (ref 12.0–15.0)
Immature Granulocytes: 0 %
Lymphocytes Relative: 22 %
Lymphs Abs: 2.7 10*3/uL (ref 0.7–4.0)
MCH: 31.5 pg (ref 26.0–34.0)
MCHC: 32.5 g/dL (ref 30.0–36.0)
MCV: 96.9 fL (ref 80.0–100.0)
Monocytes Absolute: 1 10*3/uL (ref 0.1–1.0)
Monocytes Relative: 9 %
Neutro Abs: 7.6 10*3/uL (ref 1.7–7.7)
Neutrophils Relative %: 64 %
Platelets: 217 10*3/uL (ref 150–400)
RBC: 3.84 MIL/uL — ABNORMAL LOW (ref 3.87–5.11)
RDW: 13.5 % (ref 11.5–15.5)
WBC: 11.9 10*3/uL — ABNORMAL HIGH (ref 4.0–10.5)
nRBC: 0 % (ref 0.0–0.2)

## 2021-11-25 LAB — BLOOD GAS, ARTERIAL
Acid-Base Excess: 8.6 mmol/L — ABNORMAL HIGH (ref 0.0–2.0)
Bicarbonate: 35.2 mmol/L — ABNORMAL HIGH (ref 20.0–28.0)
Drawn by: 270211
FIO2: 44
O2 Content: 6 L/min
O2 Saturation: 99.3 %
Patient temperature: 98.6
pCO2 arterial: 60.6 mmHg — ABNORMAL HIGH (ref 32.0–48.0)
pH, Arterial: 7.382 (ref 7.350–7.450)
pO2, Arterial: 147 mmHg — ABNORMAL HIGH (ref 83.0–108.0)

## 2021-11-25 LAB — BASIC METABOLIC PANEL
Anion gap: 4 — ABNORMAL LOW (ref 5–15)
BUN: 24 mg/dL — ABNORMAL HIGH (ref 8–23)
CO2: 39 mmol/L — ABNORMAL HIGH (ref 22–32)
Calcium: 9 mg/dL (ref 8.9–10.3)
Chloride: 97 mmol/L — ABNORMAL LOW (ref 98–111)
Creatinine, Ser: 1.05 mg/dL — ABNORMAL HIGH (ref 0.44–1.00)
GFR, Estimated: 58 mL/min — ABNORMAL LOW (ref >60)
Glucose, Bld: 122 mg/dL — ABNORMAL HIGH (ref 70–99)
Potassium: 3.3 mmol/L — ABNORMAL LOW (ref 3.5–5.1)
Sodium: 140 mmol/L (ref 135–145)

## 2021-11-25 LAB — BRAIN NATRIURETIC PEPTIDE: B Natriuretic Peptide: 37.9 pg/mL (ref 0.0–100.0)

## 2021-11-25 MED ORDER — ROPINIROLE HCL 1 MG PO TABS
2.0000 mg | ORAL_TABLET | Freq: Every day | ORAL | Status: DC
  Administered 2021-11-25: 21:00:00 2 mg via ORAL
  Filled 2021-11-25: qty 2

## 2021-11-25 MED ORDER — ALBUTEROL SULFATE (2.5 MG/3ML) 0.083% IN NEBU
INHALATION_SOLUTION | RESPIRATORY_TRACT | Status: AC
  Filled 2021-11-25: qty 12

## 2021-11-25 MED ORDER — PALIPERIDONE ER 3 MG PO TB24
3.0000 mg | ORAL_TABLET | Freq: Every morning | ORAL | Status: DC
  Administered 2021-11-26: 10:00:00 3 mg via ORAL
  Filled 2021-11-25: qty 1

## 2021-11-25 MED ORDER — ENOXAPARIN SODIUM 40 MG/0.4ML IJ SOSY
40.0000 mg | PREFILLED_SYRINGE | INTRAMUSCULAR | Status: DC
  Administered 2021-11-25 – 2021-11-26 (×2): 40 mg via SUBCUTANEOUS
  Filled 2021-11-25 (×2): qty 0.4

## 2021-11-25 MED ORDER — IBUPROFEN 400 MG PO TABS
400.0000 mg | ORAL_TABLET | Freq: Four times a day (QID) | ORAL | Status: DC | PRN
  Administered 2021-11-25: 17:00:00 400 mg via ORAL
  Filled 2021-11-25: qty 1

## 2021-11-25 MED ORDER — DM-GUAIFENESIN ER 30-600 MG PO TB12: 1.0000 | ORAL_TABLET | Freq: Two times a day (BID) | ORAL | Status: DC

## 2021-11-25 MED ORDER — GABAPENTIN 300 MG PO CAPS
600.0000 mg | ORAL_CAPSULE | Freq: Three times a day (TID) | ORAL | Status: DC
  Administered 2021-11-25 – 2021-11-26 (×3): 600 mg via ORAL
  Filled 2021-11-25 (×3): qty 2

## 2021-11-25 MED ORDER — PANTOPRAZOLE SODIUM 40 MG PO TBEC
40.0000 mg | DELAYED_RELEASE_TABLET | Freq: Every day | ORAL | Status: DC
  Administered 2021-11-25 – 2021-11-26 (×2): 40 mg via ORAL
  Filled 2021-11-25 (×2): qty 1

## 2021-11-25 MED ORDER — ONDANSETRON HCL 4 MG PO TABS: 4.0000 mg | ORAL_TABLET | Freq: Four times a day (QID) | ORAL | Status: DC | PRN

## 2021-11-25 MED ORDER — ATORVASTATIN CALCIUM 20 MG PO TABS
20.0000 mg | ORAL_TABLET | Freq: Every day | ORAL | Status: DC
  Administered 2021-11-25: 21:00:00 20 mg via ORAL
  Filled 2021-11-25: qty 1

## 2021-11-25 MED ORDER — ONDANSETRON HCL 4 MG/2ML IJ SOLN: 4.0000 mg | Freq: Four times a day (QID) | INTRAMUSCULAR | Status: DC | PRN

## 2021-11-25 MED ORDER — HYDROXYZINE PAMOATE 25 MG PO CAPS
25.0000 mg | ORAL_CAPSULE | Freq: Three times a day (TID) | ORAL | Status: DC
  Filled 2021-11-25: qty 1

## 2021-11-25 MED ORDER — ACETAMINOPHEN 650 MG RE SUPP: 650.0000 mg | Freq: Four times a day (QID) | RECTAL | Status: DC | PRN

## 2021-11-25 MED ORDER — LEVOTHYROXINE SODIUM 25 MCG PO TABS
137.0000 ug | ORAL_TABLET | Freq: Every day | ORAL | Status: DC
  Administered 2021-11-26: 07:00:00 137 ug via ORAL
  Filled 2021-11-25: qty 1

## 2021-11-25 MED ORDER — IPRATROPIUM-ALBUTEROL 0.5-2.5 (3) MG/3ML IN SOLN
3.0000 mL | Freq: Four times a day (QID) | RESPIRATORY_TRACT | Status: DC
  Administered 2021-11-25 – 2021-11-26 (×5): 3 mL via RESPIRATORY_TRACT
  Filled 2021-11-25 (×5): qty 3

## 2021-11-25 MED ORDER — POTASSIUM CHLORIDE CRYS ER 20 MEQ PO TBCR
40.0000 meq | EXTENDED_RELEASE_TABLET | Freq: Once | ORAL | Status: AC
  Administered 2021-11-25: 06:00:00 40 meq via ORAL
  Filled 2021-11-25: qty 2

## 2021-11-25 MED ORDER — MIRTAZAPINE 15 MG PO TABS
15.0000 mg | ORAL_TABLET | Freq: Every day | ORAL | Status: DC
  Administered 2021-11-25: 21:00:00 15 mg via ORAL
  Filled 2021-11-25: qty 1

## 2021-11-25 MED ORDER — POTASSIUM CHLORIDE CRYS ER 20 MEQ PO TBCR
20.0000 meq | EXTENDED_RELEASE_TABLET | Freq: Two times a day (BID) | ORAL | Status: DC
Start: 2021-11-25 — End: 2021-11-26
  Administered 2021-11-25 – 2021-11-26 (×2): 20 meq via ORAL
  Filled 2021-11-25 (×2): qty 1

## 2021-11-25 MED ORDER — GUAIFENESIN ER 600 MG PO TB12
1200.0000 mg | ORAL_TABLET | Freq: Two times a day (BID) | ORAL | Status: DC
  Administered 2021-11-25 – 2021-11-26 (×2): 1200 mg via ORAL
  Filled 2021-11-25 (×2): qty 2

## 2021-11-25 MED ORDER — HYDROXYZINE HCL 25 MG PO TABS
25.0000 mg | ORAL_TABLET | Freq: Three times a day (TID) | ORAL | Status: DC
  Administered 2021-11-25 – 2021-11-26 (×2): 25 mg via ORAL
  Filled 2021-11-25 (×2): qty 1

## 2021-11-25 MED ORDER — ALBUTEROL (5 MG/ML) CONTINUOUS INHALATION SOLN
10.0000 mg/h | INHALATION_SOLUTION | Freq: Once | RESPIRATORY_TRACT | Status: AC
  Administered 2021-11-25: 02:00:00 10 mg/h via RESPIRATORY_TRACT

## 2021-11-25 MED ORDER — ACETAMINOPHEN 325 MG PO TABS: 650.0000 mg | ORAL_TABLET | Freq: Four times a day (QID) | ORAL | Status: DC | PRN

## 2021-11-25 MED ORDER — FUROSEMIDE 20 MG PO TABS
20.0000 mg | ORAL_TABLET | Freq: Every day | ORAL | Status: DC
  Administered 2021-11-25 – 2021-11-26 (×2): 20 mg via ORAL
  Filled 2021-11-25 (×2): qty 1

## 2021-11-25 MED ORDER — ORAL CARE MOUTH RINSE
15.0000 mL | Freq: Two times a day (BID) | OROMUCOSAL | Status: DC
  Administered 2021-11-25 – 2021-11-26 (×2): 15 mL via OROMUCOSAL

## 2021-11-25 MED ORDER — NICOTINE POLACRILEX 2 MG MT GUM
2.0000 mg | CHEWING_GUM | OROMUCOSAL | Status: DC | PRN
  Filled 2021-11-25: qty 1

## 2021-11-25 MED ORDER — SERTRALINE HCL 100 MG PO TABS
100.0000 mg | ORAL_TABLET | Freq: Every day | ORAL | Status: DC
  Administered 2021-11-25 – 2021-11-26 (×2): 100 mg via ORAL
  Filled 2021-11-25 (×2): qty 1

## 2021-11-25 MED ORDER — CLONAZEPAM 0.5 MG PO TABS
0.5000 mg | ORAL_TABLET | Freq: Two times a day (BID) | ORAL | Status: DC
  Administered 2021-11-25 – 2021-11-26 (×3): 0.5 mg via ORAL
  Filled 2021-11-25 (×3): qty 1

## 2021-11-25 MED ORDER — PREDNISONE 20 MG PO TABS
40.0000 mg | ORAL_TABLET | Freq: Every day | ORAL | Status: DC
  Administered 2021-11-25 – 2021-11-26 (×2): 40 mg via ORAL
  Filled 2021-11-25 (×2): qty 2

## 2021-11-25 NOTE — Progress Notes (Signed)
Pt refused BiPAP qhs.  Pt encouraged to contacvt RT should she change her mind.

## 2021-11-25 NOTE — Progress Notes (Signed)
Critical PC02 70.1 sent to [facility] NP

## 2021-11-25 NOTE — H&P (Addendum)
History and Physical    ED RAYSON TML:[id number] DOB: [birth year] DOA: 11/25/2021  PCP: Ronald Pippins, NP   Patient coming from: Home  I have personally briefly reviewed patient's old medical records in [place]  CC: SOB, wheezing HPI: 65 year old Caucasian female with a long history of COPD, chronic hypoxic respiratory failure on 4 L of oxygen, chronic schizoaffective disorder, hypertension presents to the ER today with shortness of breath last 2 weeks.  She was seen in the ER on December 13,2022.  Received a continuous nebulizer treatment for the hour.  Was given steroids and discharged home.  She completed 10 days of 40 mg of prednisone.  She completed her antibiotic.  She is continue to smoke despite being on oxygen and prednisone.  She came tonight via EMS due to shortness of breath.  She lives normally in [place] but is currently staying with her ex-husband.  Patient denies any fever or chills.  She has been short of breath.  She has been smoking cigarettes and marijuana.  She denies any alcohol use.  Laboratory evaluation White count 11.9. Serum CO2 of 39, BUN of 24, creatinine 1.0.  Chest x-ray is negative.  BNP is normal at 37.  Patient is COVID-negative flu negative.  Due to continued wheezing, [facility] contacted for admission.   ED Course: Continued wheezing.  Patient received Solu-Medrol, magnesium by EMS.  Chest x-ray negative.  Sats 100 percent on home dose 4 L of oxygen.  Review of Systems:  Review of Systems  Constitutional:  Positive for malaise/fatigue. Negative for chills and fever.  HENT: Negative.    Eyes: Negative.   Respiratory:  Positive for cough, shortness of breath and wheezing.   Cardiovascular: Negative.   Gastrointestinal: Negative.   Genitourinary: Negative.   Musculoskeletal: Negative.   Skin: Negative.   Neurological: Negative.   Endo/Heme/Allergies: Negative.   Psychiatric/Behavioral: Negative.    All other systems reviewed  and are negative.  Past Medical History:  Diagnosis Date   Acid reflux    Breast cancer (HCC)    COPD (chronic obstructive pulmonary disease) ([place])    Drug overdose 04/24/2017   High cholesterol    Hypertension    Hyponatremia 01/31/2017   Hypothyroid    Schizoaffective disorder    Suicide attempt ([place]) 04/25/2017    Past Surgical History:  Procedure Laterality Date   APPENDECTOMY     BREAST LUMPECTOMY Left 2004   MASTECTOMY, PARTIAL     TUBAL LIGATION       reports that she has been smoking cigars. She has never used smokeless tobacco. She reports current drug use. Drug: Marijuana. She reports that she does not drink alcohol.  Allergies  Allergen Reactions   Budesonide Shortness Of Breath   Promethazine Nausea And Vomiting   Lisinopril Other (See Comments)    "Dizzy spells"    Family History  Problem Relation Age of Onset   Heart attack Father    Hypertension Father    Cancer Other     Prior to Admission medications   Medication Sig Start Date End Date Taking? Authorizing Provider  albuterol (VENTOLIN HFA) 108 (90 Base) MCG/ACT inhaler Inhale 2 puffs into the lungs every 4 (four) hours as needed for wheezing or shortness of breath. 11/16/21   Orpah Greek, MD  amLODipine (NORVASC) 10 MG tablet Take 10 mg by mouth daily. 04/17/17   [provider]  atorvastatin (LIPITOR) 20 MG tablet Take 1 tablet (20 mg total) by mouth at  bedtime. 05/05/16   Withrow, Elyse Jarvis, FNP  doxycycline (VIBRAMYCIN) 100 MG capsule Take 1 capsule (100 mg total) by mouth 2 (two) times daily. 11/16/21   Orpah Greek, MD  furosemide (LASIX) 20 MG tablet Take 20 mg by mouth daily. 05/01/17   [provider]  gabapentin (NEURONTIN) 300 MG capsule Take 300 mg by mouth 3 (three) times daily.     [provider]  hydrOXYzine (VISTARIL) 25 MG capsule Take 25 mg by mouth daily. 05/05/17   [provider]  levothyroxine (SYNTHROID, LEVOTHROID) 125 MCG tablet  Take 125 mcg by mouth daily before breakfast.     [provider]  lisinopril (PRINIVIL,ZESTRIL) 10 MG tablet Take 10 mg by mouth daily.    [provider]  magnesium oxide (MAG-OX) 400 MG tablet Take 2 tablets (800 mg total) by mouth 2 (two) times daily. 02/03/17   Mabe, Forbes Cellar, MD  mirtazapine (REMERON) 15 MG tablet Take 15 mg by mouth at bedtime. 05/05/17   [provider]  Multiple Vitamin (MULTIVITAMIN WITH MINERALS) TABS tablet Take 1 tablet by mouth daily.    [provider]  potassium chloride SA (K-DUR,KLOR-CON) 20 MEQ tablet Take 20 mEq by mouth 2 (two) times daily.     [provider]  predniSONE (DELTASONE) 20 MG tablet Take 2 tablets (40 mg total) by mouth daily with breakfast. 11/16/21   Pollina, Gwenyth Allegra, MD  rOPINIRole (REQUIP) 1 MG tablet Take 1 mg by mouth at bedtime.    [provider]  sertraline (ZOLOFT) 100 MG tablet Take 100 mg by mouth daily. 05/05/17   [provider]  thiothixene (NAVANE) 5 MG capsule Take 5 mg by mouth at bedtime. 05/05/17   [provider]  zolpidem (AMBIEN) 10 MG tablet Take 10 mg by mouth at bedtime. 04/11/17   [provider]    Physical Exam: Vitals:   11/25/21 0400 11/25/21 0500 11/25/21 0530 11/25/21 0549  BP: 128/64 (!) 140/59 (!) 143/73   Pulse: 86 84 84   Resp: (!) 22 19 15    Temp:   98.2 F (36.8 C)   TempSrc:   Oral   SpO2: 93% 95% 95% 95%  Weight:      Height:        Physical Exam Vitals and nursing note reviewed.  Constitutional:      General: She is not in acute distress.    Appearance: Normal appearance. She is not ill-appearing, toxic-appearing or diaphoretic.     Comments: Chronically ill-appearing  HENT:     Head: Normocephalic and atraumatic.     Nose: Nose normal. No rhinorrhea.  Eyes:     General:        Right eye: No discharge.        Left eye: No discharge.  Cardiovascular:     Rate and Rhythm: Normal rate and regular rhythm.      Pulses: Normal pulses.  Pulmonary:     Effort: No respiratory distress.     Breath sounds: Decreased air movement present. Examination of the right-upper field reveals decreased breath sounds. Examination of the left-upper field reveals decreased breath sounds. Examination of the right-middle field reveals decreased breath sounds. Examination of the left-middle field reveals decreased breath sounds. Examination of the right-lower field reveals wheezing. Examination of the left-lower field reveals wheezing. Decreased breath sounds and wheezing present.  Abdominal:     General: Bowel sounds are normal. There is no distension.     Tenderness:  There is no abdominal tenderness. There is no guarding or rebound.  Musculoskeletal:     Right lower leg: No edema.     Left lower leg: No edema.  Skin:    General: Skin is warm and dry.     Capillary Refill: Capillary refill takes less than 2 seconds.  Neurological:     General: No focal deficit present.     Mental Status: She is alert and oriented to person, place, and time.     Labs on Admission: I have personally reviewed following labs and imaging studies  CBC: Recent Labs  Lab 11/25/21 0136  WBC 11.9*  NEUTROABS 7.6  HGB 12.1  HCT 37.2  MCV 96.9  PLT 329   Basic Metabolic Panel: Recent Labs  Lab 11/25/21 0136  NA 140  K 3.3*  CL 97*  CO2 39*  GLUCOSE 122*  BUN 24*  CREATININE 1.05*  CALCIUM 9.0   GFR: Estimated Creatinine Clearance: 50 mL/min (A) (by C-G formula based on SCr of 1.05 mg/dL (H)). Liver Function Tests: No results for input(s): AST, ALT, ALKPHOS, BILITOT, PROT, ALBUMIN in the last 168 hours. No results for input(s): LIPASE, AMYLASE in the last 168 hours. No results for input(s): AMMONIA in the last 168 hours. Coagulation Profile: No results for input(s): INR, PROTIME in the last 168 hours. Cardiac Enzymes: No results for input(s): CKTOTAL, CKMB, CKMBINDEX, TROPONINI in the last 168 hours. BNP (last 3  results) No results for input(s): PROBNP in the last 8760 hours. HbA1C: No results for input(s): HGBA1C in the last 72 hours. CBG: No results for input(s): GLUCAP in the last 168 hours. Lipid Profile: No results for input(s): CHOL, HDL, LDLCALC, TRIG, CHOLHDL, LDLDIRECT in the last 72 hours. Thyroid Function Tests: No results for input(s): TSH, T4TOTAL, FREET4, T3FREE, THYROIDAB in the last 72 hours. Anemia Panel: No results for input(s): VITAMINB12, FOLATE, FERRITIN, TIBC, IRON, RETICCTPCT in the last 72 hours. Urine analysis:    Component Value Date/Time   COLORURINE STRAW (A) 11/16/2021 0405   APPEARANCEUR CLEAR 11/16/2021 0405   LABSPEC 1.014 11/16/2021 0405   PHURINE 6.0 11/16/2021 0405   GLUCOSEU NEGATIVE 11/16/2021 0405   HGBUR NEGATIVE 11/16/2021 0405   BILIRUBINUR NEGATIVE 11/16/2021 0405   KETONESUR NEGATIVE 11/16/2021 0405   PROTEINUR NEGATIVE 11/16/2021 0405   UROBILINOGEN 0.2 09/19/2012 2334   NITRITE NEGATIVE 11/16/2021 0405   LEUKOCYTESUR NEGATIVE 11/16/2021 0405    Radiological Exams on Admission: I have personally reviewed images DG Chest Port 1 View  Result Date: 11/25/2021 CLINICAL DATA:  Initial evaluation for acute shortness of breath. EXAM: PORTABLE CHEST 1 VIEW COMPARISON:  Prior radiograph from 11/16/2021. FINDINGS: Cardiomegaly, stable. Mediastinal silhouette within normal limits. Aortic atherosclerosis. Lungs mildly hypoinflated. Diffuse vascular and interstitial prominence consistent with pulmonary interstitial edema. Probable small bilateral pleural effusions. Superimposed hazy bibasilar opacities favored to reflect edema and/or atelectasis. No definite focal infiltrates. No pneumothorax. No acute osseous finding. IMPRESSION: 1. Cardiomegaly with diffuse pulmonary interstitial edema and probable small bilateral pleural effusions, suggesting CHF. 2. Superimposed hazy bibasilar opacities, favored to reflect edema and/or atelectasis. 3.  Aortic  Atherosclerosis (ICD10-I70.0). Electronically Signed   By: Jeannine Boga M.D.   On: 11/25/2021 02:38    EKG: I have personally reviewed EKG: NSR    Assessment/Plan Principal Problem:   COPD with acute exacerbation (HCC) Active Problems:   Hypokalemia   Hypothyroidism   Schizoaffective disorder, depressive type ([place])   Essential hypertension   Tobacco abuse   Chronic  respiratory failure with hypoxia, on home O2 therapy (HCC) - 4 L/min    COPD with acute exacerbation ([place]) Admit to medical bed. Continue with prednisone. duonebs q6h. Pt just finished course of po abx this week. No need for continued abx. Advised her to stop smoking  Hypokalemia Treat with po kcl  Hypothyroidism Stable. On synthroid  Schizoaffective disorder, depressive type ([place]) Will need updated med rec as pt's PCP is not with [facility]. PCP is in [place].  Essential hypertension Stable.  Tobacco abuse Pt continues to smoke despite 4 L/min home O2 and recent COPD exacerbation.  Chronic respiratory failure with hypoxia, on home O2 therapy (HCC) - 4 L/min Continue with supplemental O2.   DVT prophylaxis: Lovenox Code Status: Full Code Family Communication: no family at bedside  Disposition Plan: return home  Consults called: none  Admission status: Observation, Med-Surg   Kristopher Oppenheim, DO [facility] 11/25/2021, 7:29 AM

## 2021-11-25 NOTE — Assessment & Plan Note (Signed)
Body mass index is 31.09 kg/m.

## 2021-11-25 NOTE — Assessment & Plan Note (Signed)
Continue with supplemental O2 

## 2021-11-25 NOTE — Assessment & Plan Note (Addendum)
Resume home amlodipine on discharge.

## 2021-11-25 NOTE — ED Triage Notes (Signed)
PER EMS: pt from home with c/o SOB that started yesterday but got worse tonight w/o relief from 3 home neb treatments. Decreased left lung sounds, wheezing to right lung. EMS adm 5mg  of Albuterol, 0.5mg  of Atrovent, 125mg  of Solumedrol, 2g of Magnesium. 89% on arrival on 4L which she wears baseline  BP- 124/80, HR-70, O2 now 99% 4L [place].

## 2021-11-25 NOTE — Assessment & Plan Note (Addendum)
-  Continue Invega, Klonopin, Remeron

## 2021-11-25 NOTE — ED Provider Notes (Signed)
[facility] DEPT Provider Note   CSN: [id number] Arrival date & time: 11/25/21  0103     History Chief Complaint  Patient presents with   Shortness of Breath    Audrey Meyer is a 65 y.o. female.  The history is provided by the patient and medical records. No language interpreter was used.  Shortness of Breath  64 year old female significant history of COPD, breast cancer, schizoaffective disorder brought here via EMS from home with complaints of shortness of breath.  Patient report symptoms started since yesterday.  She endorsed increased shortness of breath, and wheezing that felt similar to her prior COPD exacerbation.  Despite of using her rescue inhaler hourly for multiple hours as well as using her nebulizer 3 consecutive times she noticed no improvement.  When EMS arrived, patient was noted to have decreased lung sounds and she was given 5 mg of albuterol, 0.5 mg of Atrovent, 125 mg of Solu-Medrol, and 2 g of magnesium.  She was noted to have an O2 sats of 89% on 4 L which she wears at baseline.  After treatment her O2 sats did improve. Patient otherwise endorsed some runny nose, mild nausea cough, and mild throat irritation.  She denies fever or chills no vomiting or diarrhea no exertional chest pain.  Past Medical History:  Diagnosis Date   Acid reflux    Breast cancer (HCC)    COPD (chronic obstructive pulmonary disease) (HCC)    High cholesterol    Hypertension    Hypothyroid    Schizoaffective disorder     Patient Active Problem List   Diagnosis Date Noted   Suicide attempt ([place]) 04/25/2017   Hypotension due to drugs 04/25/2017   Drug overdose 04/24/2017   Hyponatremia 01/31/2017   Hypomagnesemia 01/31/2017   Major depressive disorder, recurrent episode, moderate ([place]) 04/28/2016   Chronic obstructive pulmonary disease ([place])    Hypokalemia 03/07/2016   Hypothyroidism 03/07/2016   Schizoaffective disorder, depressive type ([place])  03/07/2016   Essential hypertension 03/07/2016   Tobacco abuse 03/07/2016    Past Surgical History:  Procedure Laterality Date   APPENDECTOMY     BREAST LUMPECTOMY Left 2004   MASTECTOMY, PARTIAL     TUBAL LIGATION       OB History   No obstetric history on file.     Family History  Problem Relation Age of Onset   Heart attack Father    Hypertension Father    Cancer Other     Social History   Tobacco Use   Smoking status: Every Day    Packs/day: 0.00    Years: 2.00    Pack years: 0.00    Types: Cigars, Cigarettes   Smokeless tobacco: Never  Substance Use Topics   Alcohol use: No   Drug use: No    Home Medications Prior to Admission medications   Medication Sig Start Date End Date Taking? Authorizing Provider  albuterol (VENTOLIN HFA) 108 (90 Base) MCG/ACT inhaler Inhale 2 puffs into the lungs every 4 (four) hours as needed for wheezing or shortness of breath. 11/16/21   Orpah Greek, MD  amLODipine (NORVASC) 10 MG tablet Take 10 mg by mouth daily. 04/17/17   [provider]  atorvastatin (LIPITOR) 20 MG tablet Take 1 tablet (20 mg total) by mouth at bedtime. 05/05/16   Withrow, Elyse Jarvis, FNP  doxycycline (VIBRAMYCIN) 100 MG capsule Take 1 capsule (100 mg total) by mouth 2 (two) times daily. 11/16/21   Orpah Greek,  MD  furosemide (LASIX) 20 MG tablet Take 20 mg by mouth daily. 05/01/17   [provider]  gabapentin (NEURONTIN) 300 MG capsule Take 300 mg by mouth 3 (three) times daily.     [provider]  hydrOXYzine (VISTARIL) 25 MG capsule Take 25 mg by mouth daily. 05/05/17   [provider]  levothyroxine (SYNTHROID, LEVOTHROID) 125 MCG tablet Take 125 mcg by mouth daily before breakfast.     [provider]  lisinopril (PRINIVIL,ZESTRIL) 10 MG tablet Take 10 mg by mouth daily.    [provider]  magnesium oxide (MAG-OX) 400 MG tablet Take 2 tablets (800 mg total) by mouth 2 (two) times daily.  02/03/17   Mabe, Forbes Cellar, MD  mirtazapine (REMERON) 15 MG tablet Take 15 mg by mouth at bedtime. 05/05/17   [provider]  Multiple Vitamin (MULTIVITAMIN WITH MINERALS) TABS tablet Take 1 tablet by mouth daily.    [provider]  potassium chloride SA (K-DUR,KLOR-CON) 20 MEQ tablet Take 20 mEq by mouth 2 (two) times daily.     [provider]  predniSONE (DELTASONE) 20 MG tablet Take 2 tablets (40 mg total) by mouth daily with breakfast. 11/16/21   Pollina, Gwenyth Allegra, MD  rOPINIRole (REQUIP) 1 MG tablet Take 1 mg by mouth at bedtime.    [provider]  sertraline (ZOLOFT) 100 MG tablet Take 100 mg by mouth daily. 05/05/17   [provider]  thiothixene (NAVANE) 5 MG capsule Take 5 mg by mouth at bedtime. 05/05/17   [provider]  zolpidem (AMBIEN) 10 MG tablet Take 10 mg by mouth at bedtime. 04/11/17   [provider]    Allergies    Budesonide and Promethazine  Review of Systems   Review of Systems  Respiratory:  Positive for shortness of breath.   All other systems reviewed and are negative.  Physical Exam Updated Vital Signs BP (!) 151/70 (BP Location: Right Arm)    Pulse 83    Temp 98.5 F (36.9 C) (Oral)    Resp (!) 22    Ht 5\' 2"  (1.575 m)    Wt 77.1 kg    SpO2 100%    BMI 31.09 kg/m   Physical Exam Vitals and nursing note reviewed.  Constitutional:      General: She is not in acute distress.    Appearance: She is well-developed.     Comments: Patient resting comfortably, wearing supplemental oxygen  HENT:     Head: Atraumatic.  Eyes:     Conjunctiva/sclera: Conjunctivae normal.  Cardiovascular:     Rate and Rhythm: Normal rate and regular rhythm.  Pulmonary:     Effort: Pulmonary effort is normal.     Breath sounds: Wheezing (Decreased breath sounds with expiratory wheezes) present.  Abdominal:     Palpations: Abdomen is soft.     Tenderness: There is no abdominal tenderness.  Musculoskeletal:      Cervical back: Neck supple.  Skin:    Findings: No rash.  Neurological:     Mental Status: She is alert. Mental status is at baseline.  Psychiatric:        Mood and Affect: Mood normal.    ED Results / Procedures / Treatments   Labs (all labs ordered are listed, but only abnormal results are displayed) Labs Reviewed  BASIC METABOLIC PANEL - Abnormal; Notable for the following components:      Result Value   Potassium 3.3 (*)    Chloride  97 (*)    CO2 39 (*)    Glucose, Bld 122 (*)    BUN 24 (*)    Creatinine, Ser 1.05 (*)    GFR, Estimated 58 (*)    Anion gap 4 (*)    All other components within normal limits  CBC WITH DIFFERENTIAL/PLATELET - Abnormal; Notable for the following components:   WBC 11.9 (*)    RBC 3.84 (*)    All other components within normal limits  RESP PANEL BY RT-PCR (FLU A&B, COVID) [id number]  BRAIN NATRIURETIC PEPTIDE    EKG EKG Interpretation  Date/Time:  Thursday November 25 2021 01:29:15 EST Ventricular Rate:  81 PR Interval:  147 QRS Duration: 99 QT Interval:  393 QTC Calculation: 457 R Axis:   96 Text Interpretation: Sinus rhythm Right axis deviation Confirmed by Malvin Johns [phone]) on 11/25/2021 2:35:57 AM  Radiology DG Chest Port 1 View  Result Date: 11/25/2021 CLINICAL DATA:  Initial evaluation for acute shortness of breath. EXAM: PORTABLE CHEST 1 VIEW COMPARISON:  Prior radiograph from 11/16/2021. FINDINGS: Cardiomegaly, stable. Mediastinal silhouette within normal limits. Aortic atherosclerosis. Lungs mildly hypoinflated. Diffuse vascular and interstitial prominence consistent with pulmonary interstitial edema. Probable small bilateral pleural effusions. Superimposed hazy bibasilar opacities favored to reflect edema and/or atelectasis. No definite focal infiltrates. No pneumothorax. No acute osseous finding. IMPRESSION: 1. Cardiomegaly with diffuse pulmonary interstitial edema and probable small bilateral pleural effusions, suggesting  CHF. 2. Superimposed hazy bibasilar opacities, favored to reflect edema and/or atelectasis. 3.  Aortic Atherosclerosis (ICD10-I70.0). Electronically Signed   By: Jeannine Boga M.D.   On: 11/25/2021 02:38    Procedures Procedures   Medications Ordered in ED Medications  albuterol (PROVENTIL) (2.5 MG/3ML) 0.083% nebulizer solution (  Not Given 11/25/21 0217)  albuterol (PROVENTIL,VENTOLIN) solution continuous neb (10 mg/hr Nebulization Given 11/25/21 0216)    ED Course  I have reviewed the triage vital signs and the nursing notes.  Pertinent labs & imaging results that were available during my care of the patient were reviewed by me and considered in my medical decision making (see chart for details).    MDM Rules/Calculators/A&P                         BP (!) 151/70 (BP Location: Right Arm)    Pulse 83    Temp 98.5 F (36.9 C) (Oral)    Resp (!) 22    Ht 5\' 2"  (1.575 m)    Wt 77.1 kg    SpO2 100%    BMI 31.09 kg/m      Final Clinical Impression(s) / ED Diagnoses Final diagnoses:  COPD exacerbation ([place])    Rx / DC Orders ED Discharge Orders     None      2:01 AM Patient here with increased shortness of breath, known history of COPD.  She has decreased breath sounds with expiratory wheezes and would benefit from further breathing treatment.  Will place patient on hour-long nebs.  Work-up initiated.  4:13 AM Chest x-ray obtained shown cardiomegaly with diffuse pulmonary interstitial edema and probable small bilateral pleural effusions suggestive of CHF.  Superimposed hazy bilateral opacity, favored to reflect edema and/or atelectasis.  Patient however does not presents typical for her CHF.  She has normal BNP, normal viral respiratory panel.  After receiving hour-long nebs treatment as she did report some mild improvement however on reexamination patient still has decreased breath sounds with expiratory wheezes.  Given no significant improvement  of her symptoms, will  consult medicine for admission.  Care discussed with Dr. Tamera Punt.    4:22 AM Appreciate consultation from [facility] Dr. Bridgett Larsson who agrees to see and will admit pt for further management.    Domenic Moras, PA-C 11/25/21 0423    Malvin Johns, MD 11/25/21 [phone]

## 2021-11-25 NOTE — Progress Notes (Signed)
° °  Patient seen and examined at bedside, patient admitted after midnight, please see earlier detailed admission note by Kristopher Oppenheim, DO. Briefly, patient presented secondary to dyspnea and wheezing with concern for a COPD exacerbation. Started on steroids and Duonebs.  Subjective: No issues overnight. Cough without ability to cough up sputum.  BP (!) 143/73    Pulse 90    Temp 98.2 F (36.8 C) (Oral)    Resp (!) 23    Ht 5\' 2"  (1.575 m)    Wt 77.1 kg    SpO2 98%    BMI 31.09 kg/m   General exam: Appears calm and comfortable  Respiratory system: Wheezing. Respiratory effort normal. Cardiovascular system: S1 & S2 heard, RRR. No murmurs, rubs, gallops or clicks. Gastrointestinal system: Abdomen is nondistended, soft and nontender. No organomegaly or masses felt. Normal bowel sounds heard. Central nervous system: Alert and oriented. No focal neurological deficits. Musculoskeletal: No edema. No calf tenderness Skin: No cyanosis. No rashes Psychiatry: Judgement and insight appear normal. Mood & affect appropriate.   Brief assessment/Plan:  * COPD with acute exacerbation ([place]) Appears to be mild. -Continue prednisone, Duoneb  Chronic respiratory failure with hypercapnia (HCC) ABG with pH of 7.382, pCO2 of 60.6 and bicarbonate of 35.2. Consistent with chronic hypercarbic failure likely secondary to underlying COPD. -BiPAP qhs  Chronic respiratory failure with hypoxia, on home O2 therapy (HCC) - 4 L/min Continue with supplemental O2.  Obesity (BMI 30-39.9) Body mass index is 31.09 kg/m.  Essential hypertension- (present on admission) Stable, so hold amlodipine  Schizoaffective disorder, depressive type ([place])- (present on admission) -Continue Invega, Klonopin, Remeron  Hypothyroidism- (present on admission) -Continue Synthroid  Tobacco abuse- (present on admission) Pt continues to smoke despite 4 L/min home O2 and recent COPD exacerbation.  Hypokalemia- (present on  admission) -Continue potassium supplementation    Family communication: None at bedside DVT prophylaxis: Lovenox Disposition: Discharge home likely in 24 hours pending PT/OT, continued stability  Cordelia Poche, MD [facility] 11/25/2021, 10:53 AM

## 2021-11-25 NOTE — Assessment & Plan Note (Addendum)
ABG with pH of 7.382, pCO2 of 60.6 and bicarbonate of 35.2. Consistent with chronic hypercarbic failure likely secondary to underlying COPD. Patient declined BiPAP. Recommend patient to follow-up with her pulmonologist.

## 2021-11-25 NOTE — Assessment & Plan Note (Addendum)
Continue Synthroid °

## 2021-11-25 NOTE — Assessment & Plan Note (Addendum)
Continue home potassium.

## 2021-11-25 NOTE — ED Notes (Addendum)
Pt was able to walk to the bathroom on O2 but when pt got back to the room she was very short of breath even with O2 on.

## 2021-11-25 NOTE — Assessment & Plan Note (Signed)
Pt continues to smoke despite 4 L/min home O2 and recent COPD exacerbation.

## 2021-11-25 NOTE — Subjective & Objective (Signed)
CC: SOB, wheezing HPI: 65 year old Caucasian female with a long history of COPD, chronic hypoxic respiratory failure on 4 L of oxygen, chronic schizoaffective disorder, hypertension presents to the ER today with shortness of breath last 2 weeks.  She was seen in the ER on December 13,2022.  Received a continuous nebulizer treatment for the hour.  Was given steroids and discharged home.  She completed 10 days of 40 mg of prednisone.  She completed her antibiotic.  She is continue to smoke despite being on oxygen and prednisone.  She came tonight via EMS due to shortness of breath.  She lives normally in [place] but is currently staying with her ex-husband.  Patient denies any fever or chills.  She has been short of breath.  She has been smoking cigarettes and marijuana.  She denies any alcohol use.  Laboratory evaluation White count 11.9. Serum CO2 of 39, BUN of 24, creatinine 1.0.  Chest x-ray is negative.  BNP is normal at 37.  Patient is COVID-negative flu negative.  Due to continued wheezing, [facility] hospitalist contacted for admission.

## 2021-11-25 NOTE — Assessment & Plan Note (Addendum)
Appears to be mild. Treated with Duoneb and prednisone. Near baseline and patient comfortable with discharge. Discharge with a prednisone taper.

## 2021-11-26 DIAGNOSIS — J441 Chronic obstructive pulmonary disease with (acute) exacerbation: Secondary | ICD-10-CM | POA: Diagnosis not present

## 2021-11-26 LAB — POTASSIUM: Potassium: 3.6 mmol/L (ref 3.5–5.1)

## 2021-11-26 MED ORDER — GUAIFENESIN ER 600 MG PO TB12
1200.0000 mg | ORAL_TABLET | Freq: Two times a day (BID) | ORAL | 0 refills | Status: AC
Start: 2021-11-26 — End: 2021-12-01

## 2021-11-26 MED ORDER — PREDNISONE 10 MG PO TABS: ORAL_TABLET | ORAL | 0 refills | Status: AC

## 2021-11-26 NOTE — Evaluation (Signed)
Physical Therapy Evaluation Patient Details Name: Audrey Meyer MRN: [id number] DOB: Oct 11, 1956 Today's Date: 11/26/2021  History of Present Illness  65 year old Caucasian female with a long history of COPD, chronic hypoxic respiratory failure on 4 L of oxygen, chronic schizoaffective disorder, hypertension presents to the ER today with shortness of breath last 2 weeks. Admitted with dyspnea and wheezing with concern for a COPD exacerbation.  Pt on 4L O2 at home PRN but wears it often.  Clinical Impression  Pt ambulated 200' without an assistive device, no loss of balance, SpO2 96% on 4L O2 walking, 3/4 dyspnea. Pt is mobilizing independently, appears to  be at baseline, no further acute PT indicated, will sign off.       Recommendations for follow up therapy are one component of a multi-disciplinary discharge planning process, led by the attending physician.  Recommendations may be updated based on patient status, additional functional criteria and insurance authorization.  Follow Up Recommendations No PT follow up    Assistance Recommended at Discharge None  Functional Status Assessment Patient has not had a recent decline in their functional status  Equipment Recommendations  None recommended by PT    Recommendations for Other Services       Precautions / Restrictions Precautions Precautions: Fall Precaution Comments: pt denies falls in past 6 months Restrictions Weight Bearing Restrictions: No      Mobility  Bed Mobility Overal bed mobility: Modified Independent                  Transfers Overall transfer level: Independent Equipment used: None                    Ambulation/Gait Ambulation/Gait assistance: Independent Gait Distance (Feet): 200 Feet Assistive device: None Gait Pattern/deviations: WFL(Within Functional Limits);Decreased stride length Gait velocity: mildly decr     General Gait Details: steady, no loss of balance, SaO2 96% on 4L O2  (her normal baseline level of O2), 3/4 dyspnea towards end of walk but recovered within 2 minutes; she declined use of AD  Stairs            Wheelchair Mobility    Modified Rankin (Stroke Patients Only)       Balance Overall balance assessment: Mild deficits observed, not formally tested                                           Pertinent Vitals/Pain Pain Assessment: No/denies pain Pain Score: 0-No pain    Home Living Family/patient expects to be discharged to:: Private residence Living Arrangements: Alone Available Help at Discharge: Family;Available PRN/intermittently Type of Home: Apartment Home Access: Stairs to enter Entrance Stairs-Rails: Left Entrance Stairs-Number of Steps: "I have to go down a few steps, about 4, then a sidewalk, then a couple more steps, then level to apartment. Rail on LT side, ascending. Lives alone in [place], but has been staying here with ex-husband recently.   Home Layout: One level Home Equipment: None Additional Comments: Just O2 concentrator.    Prior Function Prior Level of Function : Driving;Independent/Modified Independent             Mobility Comments: Pt reports independence except for getting out of breath at times and requiring rests. ADLs Comments: Pt reports independence except for getting out of breath at times and requiring rests.     Hand Dominance   Dominant  Hand: Right    Extremity/Trunk Assessment   Upper Extremity Assessment Upper Extremity Assessment: Defer to OT evaluation    Lower Extremity Assessment Lower Extremity Assessment: Overall WFL for tasks assessed    Cervical / Trunk Assessment Cervical / Trunk Assessment: Normal  Communication   Communication: No difficulties  Cognition Arousal/Alertness: Awake/alert Behavior During Therapy: WFL for tasks assessed/performed Overall Cognitive Status: Within Functional Limits for tasks assessed                                           General Comments General comments (skin integrity, edema, etc.): Pt cautious and slow with ambulation but steady without loss of balance and no use of AD.    Exercises Other Exercises Other Exercises: Pt educated on energy conservation strategies and handouts provided to reinforce information.   Assessment/Plan    PT Assessment Patient does not need any further PT services  PT Problem List         PT Treatment Interventions      PT Goals (Current goals can be found in the Care Plan section)  Acute Rehab PT Goals Patient Stated Goal: likes to cook PT Goal Formulation: All assessment and education complete, DC therapy    Frequency     Barriers to discharge        Co-evaluation               AM-PAC PT "6 Clicks" Mobility  Outcome Measure Help needed turning from your back to your side while in a flat bed without using bedrails?: None Help needed moving from lying on your back to sitting on the side of a flat bed without using bedrails?: None Help needed moving to and from a bed to a chair (including a wheelchair)?: None Help needed standing up from a chair using your arms (e.g., wheelchair or bedside chair)?: None Help needed to walk in hospital room?: None Help needed climbing 3-5 steps with a railing? : None 6 Click Score: 24    End of Session Equipment Utilized During Treatment: Gait belt;Oxygen Activity Tolerance: Patient tolerated treatment well Patient left: in bed;with call bell/phone within reach;with nursing/sitter in room Nurse Communication: Mobility status      Time: 6578-4696 PT Time Calculation (min) (ACUTE ONLY): 11 min   Charges:   PT Evaluation $PT Eval Low Complexity: 1 Low         Philomena Doheny PT 11/26/2021  Acute Rehabilitation Services Pager [phone] Office [phone]

## 2021-11-26 NOTE — Evaluation (Signed)
Occupational Therapy Evaluation Patient Details Name: Audrey Meyer MRN: [id number] DOB: [birth year] Today's Date: 11/26/2021   History of Present Illness 65 year old Caucasian female with a long history of COPD, chronic hypoxic respiratory failure on 4 L of oxygen, chronic schizoaffective disorder, hypertension presents to the ER today with shortness of breath last 2 weeks. Admitted with dyspnea and wheezing with concern for a COPD exacerbation.  Pt on 4L O2 at home PRN but wears it often.   Clinical Impression   Patient evaluated by Occupational Therapy with no further acute OT needs identified. All education has been completed with handouts to reinforce for energy conservation, and the patient has no further questions.  See below for any follow-up Occupational Therapy or equipment needs. Pt may benefit from shower chair. OT is signing off. Thank you for this referral.       Recommendations for follow up therapy are one component of a multi-disciplinary discharge planning process, led by the attending physician.  Recommendations may be updated based on patient status, additional functional criteria and insurance authorization.   Follow Up Recommendations  No OT follow up    Assistance Recommended at Discharge Intermittent Supervision/Assistance  Functional Status Assessment  Patient has had a recent decline in their functional status and demonstrates the ability to make significant improvements in function in a reasonable and predictable amount of time.  Equipment Recommendations  Tub/shower seat    Recommendations for Other Services PT consult     Precautions / Restrictions Precautions Precautions: Fall Restrictions Weight Bearing Restrictions: No      Mobility Bed Mobility Overal bed mobility: Modified Independent                  Transfers Overall transfer level: Modified independent Equipment used: None                      Balance Overall balance  assessment: Mild deficits observed, not formally tested                                         ADL either performed or assessed with clinical judgement   ADL Overall ADL's : At baseline                                       General ADL Comments: Pt able to demonstrate bed mobility, LE dressing, toileting (simulated) and transfer to toilet, in-room ambulation without AD, and intact UE function.  Pt agrees that she is nearing baseline.     Vision Baseline Vision/History: 1 Wears glasses Ability to See in Adequate Light: 0 Adequate Vision Assessment?: No apparent visual deficits     Perception     Praxis      Pertinent Vitals/Pain Pain Score: 0-No pain     Hand Dominance Right   Extremity/Trunk Assessment Upper Extremity Assessment Upper Extremity Assessment: Overall WFL for tasks assessed   Lower Extremity Assessment Lower Extremity Assessment: Defer to PT evaluation   Cervical / Trunk Assessment Cervical / Trunk Assessment: Normal   Communication Communication Communication: No difficulties   Cognition Arousal/Alertness: Awake/alert Behavior During Therapy: WFL for tasks assessed/performed Overall Cognitive Status: Within Functional Limits for tasks assessed  General Comments  Pt cautious and slow with ambulation but steady without loss of balance and no use of AD.    Exercises Other Exercises Other Exercises: Pt educated on energy conservation strategies and handouts provided to reinforce information.   Shoulder Instructions      Home Living Family/patient expects to be discharged to:: Private residence     Type of Home: Apartment Home Access: Stairs to enter [employer] of Steps: "I have to go down a few steps, about 4, then a sidewalk, then a couple more steps, then level to apartment. Rail on LT side, ascending Entrance Stairs-Rails: Left Home Layout: One  level     Bathroom Shower/Tub: Teacher, early years/pre: Standard     Home Equipment: Financial controller: Reacher Additional Comments: Just O2 concentrator.      Prior Functioning/Environment Prior Level of Function : Driving;Independent/Modified Independent             Mobility Comments: Pt reports independence except for getting out of breath at times and requiring rests. ADLs Comments: Pt reports independence except for getting out of breath at times and requiring rests.        OT Problem List: Decreased activity tolerance      OT Treatment/Interventions:      OT Goals(Current goals can be found in the care plan section) Acute Rehab OT Goals Patient Stated Goal: Get back to normal: Independent OT Goal Formulation: All assessment and education complete, DC therapy Potential to Achieve Goals: Good ADL Goals Additional ADL Goal #1: Patient will identify at least 3 energy conservation strategies to employ at home in order to maximize function and quality of life and decrease caregiver burden while preventing exacerbation of symptoms and rehospitalization.  OT Frequency:     Barriers to D/C:            Co-evaluation              AM-PAC OT "6 Clicks" Daily Activity     Outcome Measure Help from another person eating meals?: None Help from another person taking care of personal grooming?: None Help from another person toileting, which includes using toliet, bedpan, or urinal?: None Help from another person bathing (including washing, rinsing, drying)?: A Little Help from another person to put on and taking off regular upper body clothing?: None Help from another person to put on and taking off regular lower body clothing?: None 6 Click Score: 23   End of Session Equipment Utilized During Treatment: Gait belt;Oxygen  Activity Tolerance: Patient tolerated treatment well Patient left: in chair;with call bell/phone within reach  (Attempted to set chair alarm but not activating. Pt agreed not to get up without staff)  OT Visit Diagnosis:  (Z73.6. Deficit with ADLs)                Time: 0828-0901 OT Time Calculation (min): 33 min Charges:  OT General Charges $OT Visit: 1 Visit OT Evaluation $OT Eval Low Complexity: 1 Low OT Treatments $Self Care/Home Management : 8-22 mins  Anderson Malta, OT Acute Rehab Services Office: [phone] 11/26/2021 Julien Girt 11/26/2021, 9:09 AM

## 2021-11-26 NOTE — Progress Notes (Signed)
Transition of Care [facility]) Screening Note  Patient Details  Name: Audrey Meyer Date of Birth: 06-06-1956  Transition of Care [facility]) CM/SW Contact:    Sherie Don, LCSW Phone Number: 11/26/2021, 9:31 AM  Transition of Care Department [facility]) has reviewed patient and no TOC needs have been identified at this time. We will continue to monitor patient advancement through interdisciplinary progression rounds. If new patient transition needs arise, please place a TOC consult.

## 2021-11-26 NOTE — Discharge Summary (Signed)
Physician Discharge Summary  ARMA REINING QRF:[id number] DOB: 08/14/1956 DOA: 11/25/2021  PCP: Ronald Pippins, NP  Admit date: 11/25/2021 Discharge date: 11/26/2021  Admitted From: Home Disposition: Home  Recommendations for Outpatient Follow-up:  Follow up with PCP in 1 week Follow up with pulmonologist Please follow up on the following pending results: None  Home Health: None Equipment/Devices: Continue home oxygen  Discharge Condition: Stable CODE STATUS: Full code Diet recommendation: Heart healthy   Brief/Interim Summary:  Admission HPI written by Kristopher Oppenheim, DO   CC: SOB, wheezing HPI: 65 year old Caucasian female with a long history of COPD, chronic hypoxic respiratory failure on 4 L of oxygen, chronic schizoaffective disorder, hypertension presents to the ER today with shortness of breath last 2 weeks.  She was seen in the ER on December 13,2022.  Received a continuous nebulizer treatment for the hour.  Was given steroids and discharged home.  She completed 10 days of 40 mg of prednisone.  She completed her antibiotic.  She is continue to smoke despite being on oxygen and prednisone.  She came tonight via EMS due to shortness of breath.  She lives normally in [place] but is currently staying with her ex-husband.  Patient denies any fever or chills.  She has been short of breath.  She has been smoking cigarettes and marijuana.  She denies any alcohol use.  Laboratory evaluation White count 11.9. Serum CO2 of 39, BUN of 24, creatinine 1.0.  Chest x-ray is negative.  BNP is normal at 37.  Patient is COVID-negative flu negative.  Due to continued wheezing, [facility] contacted for admission.     Hospital course:  * COPD with acute exacerbation ([place]) Appears to be mild. Treated with Duoneb and prednisone. Near baseline and patient comfortable with discharge. Discharge with a prednisone taper.  Chronic respiratory failure with hypercapnia (HCC) ABG with pH  of 7.382, pCO2 of 60.6 and bicarbonate of 35.2. Consistent with chronic hypercarbic failure likely secondary to underlying COPD. Patient declined BiPAP. Recommend patient to follow-up with her pulmonologist.  Chronic respiratory failure with hypoxia, on home O2 therapy ([facility]) - 4 L/min Continue with supplemental O2.  Obesity (BMI 30-39.9) Body mass index is 31.09 kg/m.  Essential hypertension- (present on admission) Resume home amlodipine on discharge.  Schizoaffective disorder, depressive type ([place])- (present on admission) -Continue Invega, Klonopin, Remeron  Hypothyroidism- (present on admission) Continue Synthroid  Tobacco abuse- (present on admission) Pt continues to smoke despite 4 L/min home O2 and recent COPD exacerbation.  Hypokalemia- (present on admission) Continue home potassium.    Discharge Diagnoses:  Principal Problem:   COPD with acute exacerbation ([place]) Active Problems:   Chronic respiratory failure with hypoxia, on home O2 therapy ([facility]CC) - 4 L/min   Chronic respiratory failure with hypercapnia (H[facility])   Hypothyroidism   Schizoaffective disorder, depressive type ([facility])   Essential hypertension   Obesity (BMI 30-39.9)   Hypokalemia   Tobacco abuse    Discharge Instructions   Allergies as of 11/26/2021       Reactions   Budesonide Shortness Of Breath   Promethazine Nausea And Vomiting   Lisinopril Other (See Comments)   "Dizzy spells"        Medication List     TAKE these medications    Advair HFA 115-21 MCG/ACT inhaler Generic drug: fluticasone-salmeterol Inhale 2 puffs into the lungs in the morning and at bedtime.   albuterol 108 (90 Base) MCG/ACT inhaler Commonly known as: VENTOLIN HFA Inhale 2 puffs into the  lungs every 4 (four) hours as needed for wheezing or shortness of breath.   amLODipine 10 MG tablet Commonly known as: NORVASC Take 10 mg by mouth daily.   atorvastatin 20 MG tablet Commonly known as: LIPITOR Take 1 tablet  (20 mg total) by mouth at bedtime.   clonazePAM 0.5 MG tablet Commonly known as: KLONOPIN Take 0.5 mg by mouth 2 (two) times daily.   cyclobenzaprine 10 MG tablet Commonly known as: FLEXERIL Take 10 mg by mouth 3 (three) times daily as needed for muscle spasms.   dextromethorphan-guaiFENesin 30-600 MG 12hr tablet Commonly known as: MUCINEX DM Take 1 tablet by mouth 2 (two) times daily.   diclofenac 75 MG EC tablet Commonly known as: VOLTAREN Take 75 mg by mouth in the morning and at bedtime.   doxycycline 100 MG capsule Commonly known as: VIBRAMYCIN Take 1 capsule (100 mg total) by mouth 2 (two) times daily.   furosemide 20 MG tablet Commonly known as: LASIX Take 20 mg by mouth daily.   gabapentin 600 MG tablet Commonly known as: NEURONTIN Take 600 mg by mouth 3 (three) times daily.   guaiFENesin 600 MG 12 hr tablet Commonly known as: MUCINEX Take 2 tablets (1,200 mg total) by mouth 2 (two) times daily for 5 days.   hydrOXYzine 25 MG capsule Commonly known as: VISTARIL Take 25 mg by mouth 3 (three) times daily.   levalbuterol 0.63 MG/3ML nebulizer solution Commonly known as: XOPENEX Take 3 mLs by nebulization 4 (four) times daily as needed for wheezing or shortness of breath.   levothyroxine 137 MCG tablet Commonly known as: SYNTHROID Take 137 mcg by mouth daily before breakfast.   mirtazapine 15 MG tablet Commonly known as: REMERON Take 15 mg by mouth at bedtime.   multivitamin with minerals Tabs tablet Take 1 tablet by mouth daily.   paliperidone 3 MG 24 hr tablet Commonly known as: INVEGA Take 3 mg by mouth every morning.   pantoprazole 40 MG tablet Commonly known as: PROTONIX Take 40 mg by mouth daily.   potassium chloride SA 20 MEQ tablet Commonly known as: KLOR-CON M Take 20 mEq by mouth 2 (two) times daily.   predniSONE 10 MG tablet Commonly known as: DELTASONE Take 4 tablets (40 mg total) by mouth daily with breakfast for 3 days, THEN 2  tablets (20 mg total) daily with breakfast for 2 days, THEN 1 tablet (10 mg total) daily with breakfast for 2 days. Start taking on: November 26, 2021 What changed:  medication strength See the new instructions.   rOPINIRole 1 MG tablet Commonly known as: REQUIP Take 2 mg by mouth at bedtime.   sertraline 100 MG tablet Commonly known as: ZOLOFT Take 100 mg by mouth daily.        Allergies  Allergen Reactions   Budesonide Shortness Of Breath   Promethazine Nausea And Vomiting   Lisinopril Other (See Comments)    "Dizzy spells"    Consultations: None   Procedures/Studies: DG Chest Port 1 View  Result Date: 11/25/2021 CLINICAL DATA:  Initial evaluation for acute shortness of breath. EXAM: PORTABLE CHEST 1 VIEW COMPARISON:  Prior radiograph from 11/16/2021. FINDINGS: Cardiomegaly, stable. Mediastinal silhouette within normal limits. Aortic atherosclerosis. Lungs mildly hypoinflated. Diffuse vascular and interstitial prominence consistent with pulmonary interstitial edema. Probable small bilateral pleural effusions. Superimposed hazy bibasilar opacities favored to reflect edema and/or atelectasis. No definite focal infiltrates. No pneumothorax. No acute osseous finding. IMPRESSION: 1. Cardiomegaly with diffuse pulmonary interstitial edema and probable small  bilateral pleural effusions, suggesting CHF. 2. Superimposed hazy bibasilar opacities, favored to reflect edema and/or atelectasis. 3.  Aortic Atherosclerosis (ICD10-I70.0). Electronically Signed   By: Jeannine Boga M.D.   On: 11/25/2021 02:38   DG Chest Portable 1 View  Result Date: 11/16/2021 CLINICAL DATA:  Worsening shortness of breath. EXAM: PORTABLE CHEST 1 VIEW COMPARISON:  AP Lat chest 04/28/2016. FINDINGS: There is interval low inspiration. Interstitial haziness in the lung bases is seen and could be due to low lung volumes or early pneumonitis. The remaining lungs clear with COPD change. There are calcifications  in the aortic arch. The mediastinum is normally outlined. The heart is slightly enlarged. No vascular congestion is seen. Mild osteopenia and thoracic spondylosis. IMPRESSION: Low inspiration exam with hazy atelectasis or infiltrate in the lung bases. PA and lateral study in full inspiration is recommended. Electronically Signed   By: Telford Nab M.D.   On: 11/16/2021 01:21      Subjective: Feeling much better today. Not completely at baseline but close. Worked with with therapy.  Discharge Exam: Vitals:   11/26/21 0904 11/26/21 0958  BP:    Pulse: 72   Resp:    Temp:    SpO2: 96% 96%   Vitals:   11/26/21 0436 11/26/21 0500 11/26/21 0904 11/26/21 0958  BP: (!) 151/70     Pulse: 66  72   Resp: 16     Temp: 97.8 F (36.6 C)     TempSrc: Oral     SpO2: 97%  96% 96%  Weight:  78.1 kg    Height:        General: Pt is alert, awake, not in acute distress Cardiovascular: RRR, S1/S2 +, no rubs, no gallops Respiratory: Diminished with faint wheezing and decreased air movement  Abdominal: Soft, NT, ND, bowel sounds + Extremities: no edema, no cyanosis    The results of significant diagnostics from this hospitalization (including imaging, microbiology, ancillary and laboratory) are listed below for reference.     Microbiology: Recent Results (from the past 240 hour(s))  Resp Panel by RT-PCR (Flu A&B, Covid) Nasopharyngeal Swab     Status: None   Collection Time: 11/25/21  1:36 AM   Specimen: Nasopharyngeal Swab; Nasopharyngeal(NP) swabs in vial transport medium  Result Value Ref Range Status   SARS Coronavirus 2 by RT PCR NEGATIVE NEGATIVE Final    Comment: (NOTE) SARS-CoV-2 target nucleic acids are NOT DETECTED.  The SARS-CoV-2 RNA is generally detectable in upper respiratory specimens during the acute phase of infection. The lowest concentration of SARS-CoV-2 viral copies this assay can detect is 138 copies/mL. A negative result does not preclude SARS-Cov-2 infection  and should not be used as the sole basis for treatment or other patient management decisions. A negative result may occur with  improper specimen collection/handling, submission of specimen other than nasopharyngeal swab, presence of viral mutation(s) within the areas targeted by this assay, and inadequate number of viral copies(<138 copies/mL). A negative result must be combined with clinical observations, patient history, and epidemiological information. The expected result is Negative.  Fact Sheet for Patients:  EntrepreneurPulse.com.au  Fact Sheet for Healthcare Providers:  IncredibleEmployment.be  This test is no t yet approved or cleared by the [place] FDA and  has been authorized for detection and/or diagnosis of SARS-CoV-2 by FDA under an Emergency Use Authorization (EUA). This EUA will remain  in effect (meaning this test can be used) for the duration of the COVID-19 declaration under Section 564(b)(1) of the  Act, 21 U.S.C.section 360bbb-3(b)(1), unless the authorization is terminated  or revoked sooner.       Influenza A by PCR NEGATIVE NEGATIVE Final   Influenza B by PCR NEGATIVE NEGATIVE Final    Comment: (NOTE) The Xpert Xpress SARS-CoV-2/FLU/RSV plus assay is intended as an aid in the diagnosis of influenza from Nasopharyngeal swab specimens and should not be used as a sole basis for treatment. Nasal washings and aspirates are unacceptable for Xpert Xpress SARS-CoV-2/FLU/RSV testing.  Fact Sheet for Patients: EntrepreneurPulse.com.au  Fact Sheet for Healthcare Providers: IncredibleEmployment.be  This test is not yet approved or cleared by the [place] FDA and has been authorized for detection and/or diagnosis of SARS-CoV-2 by FDA under an Emergency Use Authorization (EUA). This EUA will remain in effect (meaning this test can be used) for the duration of the COVID-19 declaration  under Section 564(b)(1) of the Act, 21 U.S.C. section 360bbb-3(b)(1), unless the authorization is terminated or revoked.  Performed at [facility], [place] [address], [place], [place] [address]      Labs: BNP (last 3 results) Recent Labs    11/25/21 0136  BNP 83.1   Basic Metabolic Panel: Recent Labs  Lab 11/25/21 0136 11/26/21 0753  NA 140  --   K 3.3* 3.6  CL 97*  --   CO2 39*  --   GLUCOSE 122*  --   BUN 24*  --   CREATININE 1.05*  --   CALCIUM 9.0  --    Liver Function Tests: No results for input(s): AST, ALT, ALKPHOS, BILITOT, PROT, ALBUMIN in the last 168 hours. No results for input(s): LIPASE, AMYLASE in the last 168 hours. No results for input(s): AMMONIA in the last 168 hours. CBC: Recent Labs  Lab 11/25/21 0136  WBC 11.9*  NEUTROABS 7.6  HGB 12.1  HCT 37.2  MCV 96.9  PLT 217   Cardiac Enzymes: No results for input(s): CKTOTAL, CKMB, CKMBINDEX, TROPONINI in the last 168 hours. BNP: Invalid input(s): POCBNP CBG: No results for input(s): GLUCAP in the last 168 hours. D-Dimer No results for input(s): DDIMER in the last 72 hours. Hgb A1c No results for input(s): HGBA1C in the last 72 hours. Lipid Profile No results for input(s): CHOL, HDL, LDLCALC, TRIG, CHOLHDL, LDLDIRECT in the last 72 hours. Thyroid function studies No results for input(s): TSH, T4TOTAL, T3FREE, THYROIDAB in the last 72 hours.  Invalid input(s): FREET3 Anemia work up No results for input(s): VITAMINB12, FOLATE, FERRITIN, TIBC, IRON, RETICCTPCT in the last 72 hours. Urinalysis    Component Value Date/Time   COLORURINE STRAW (A) 11/16/2021 0405   APPEARANCEUR CLEAR 11/16/2021 0405   LABSPEC 1.014 11/16/2021 0405   PHURINE 6.0 11/16/2021 0405   GLUCOSEU NEGATIVE 11/16/2021 0405   HGBUR NEGATIVE 11/16/2021 0405   BILIRUBINUR NEGATIVE 11/16/2021 0405   KETONESUR NEGATIVE 11/16/2021 0405   PROTEINUR NEGATIVE 11/16/2021 0405   UROBILINOGEN 0.2 09/19/2012  2334   NITRITE NEGATIVE 11/16/2021 0405   LEUKOCYTESUR NEGATIVE 11/16/2021 0405   Sepsis Labs Invalid input(s): PROCALCITONIN,  WBC,  LACTICIDVEN Microbiology Recent Results (from the past 240 hour(s))  Resp Panel by RT-PCR (Flu A&B, Covid) Nasopharyngeal Swab     Status: None   Collection Time: 11/25/21  1:36 AM   Specimen: Nasopharyngeal Swab; Nasopharyngeal(NP) swabs in vial transport medium  Result Value Ref Range Status   SARS Coronavirus 2 by RT PCR NEGATIVE NEGATIVE Final    Comment: (NOTE) SARS-CoV-2 target nucleic acids are NOT DETECTED.  The SARS-CoV-2  RNA is generally detectable in upper respiratory specimens during the acute phase of infection. The lowest concentration of SARS-CoV-2 viral copies this assay can detect is 138 copies/mL. A negative result does not preclude SARS-Cov-2 infection and should not be used as the sole basis for treatment or other patient management decisions. A negative result may occur with  improper specimen collection/handling, submission of specimen other than nasopharyngeal swab, presence of viral mutation(s) within the areas targeted by this assay, and inadequate number of viral copies(<138 copies/mL). A negative result must be combined with clinical observations, patient history, and epidemiological information. The expected result is Negative.  Fact Sheet for Patients:  EntrepreneurPulse.com.au  Fact Sheet for Healthcare Providers:  IncredibleEmployment.be  This test is no t yet approved or cleared by the [place] FDA and  has been authorized for detection and/or diagnosis of SARS-CoV-2 by FDA under an Emergency Use Authorization (EUA). This EUA will remain  in effect (meaning this test can be used) for the duration of the COVID-19 declaration under Section 564(b)(1) of the Act, 21 U.S.C.section 360bbb-3(b)(1), unless the authorization is terminated  or revoked sooner.       Influenza A by  PCR NEGATIVE NEGATIVE Final   Influenza B by PCR NEGATIVE NEGATIVE Final    Comment: (NOTE) The Xpert Xpress SARS-CoV-2/FLU/RSV plus assay is intended as an aid in the diagnosis of influenza from Nasopharyngeal swab specimens and should not be used as a sole basis for treatment. Nasal washings and aspirates are unacceptable for Xpert Xpress SARS-CoV-2/FLU/RSV testing.  Fact Sheet for Patients: EntrepreneurPulse.com.au  Fact Sheet for Healthcare Providers: IncredibleEmployment.be  This test is not yet approved or cleared by the [place] FDA and has been authorized for detection and/or diagnosis of SARS-CoV-2 by FDA under an Emergency Use Authorization (EUA). This EUA will remain in effect (meaning this test can be used) for the duration of the COVID-19 declaration under Section 564(b)(1) of the Act, 21 U.S.C. section 360bbb-3(b)(1), unless the authorization is terminated or revoked.  Performed at [facility], [place] [address], [place], [place] [address]     SIGNED:   Cordelia Poche, MD [facility] 11/26/2021, 8:13 PM

## 2021-12-07 ENCOUNTER — Other Ambulatory Visit: Payer: Self-pay

## 2021-12-07 ENCOUNTER — Encounter (HOSPITAL_COMMUNITY): Payer: Self-pay | Admitting: Emergency Medicine

## 2021-12-07 ENCOUNTER — Inpatient Hospital Stay (HOSPITAL_COMMUNITY)
Admission: EM | Admit: 2021-12-07 | Discharge: 2021-12-10 | DRG: 190 | Disposition: A | Discharge status: Home / Self Care | Payer: Medicare PPO | Attending: Family Medicine | Admitting: Family Medicine

## 2021-12-07 ENCOUNTER — Emergency Department (HOSPITAL_COMMUNITY): Payer: Medicare PPO

## 2021-12-07 DIAGNOSIS — F17290 Nicotine dependence, other tobacco product, uncomplicated: Secondary | ICD-10-CM | POA: Diagnosis present

## 2021-12-07 DIAGNOSIS — Z9981 Dependence on supplemental oxygen: Secondary | ICD-10-CM

## 2021-12-07 DIAGNOSIS — Z20822 Contact with and (suspected) exposure to covid-19: Secondary | ICD-10-CM | POA: Diagnosis present

## 2021-12-07 DIAGNOSIS — Z79899 Other long term (current) drug therapy: Secondary | ICD-10-CM

## 2021-12-07 DIAGNOSIS — F251 Schizoaffective disorder, depressive type: Secondary | ICD-10-CM | POA: Diagnosis present

## 2021-12-07 DIAGNOSIS — Z9851 Tubal ligation status: Secondary | ICD-10-CM

## 2021-12-07 DIAGNOSIS — E7800 Pure hypercholesterolemia, unspecified: Secondary | ICD-10-CM | POA: Diagnosis present

## 2021-12-07 DIAGNOSIS — J439 Emphysema, unspecified: Principal | ICD-10-CM | POA: Diagnosis present

## 2021-12-07 DIAGNOSIS — Z79890 Hormone replacement therapy (postmenopausal): Secondary | ICD-10-CM

## 2021-12-07 DIAGNOSIS — J441 Chronic obstructive pulmonary disease with (acute) exacerbation: Secondary | ICD-10-CM

## 2021-12-07 DIAGNOSIS — Z853 Personal history of malignant neoplasm of breast: Secondary | ICD-10-CM

## 2021-12-07 DIAGNOSIS — Z9010 Acquired absence of unspecified breast and nipple: Secondary | ICD-10-CM

## 2021-12-07 DIAGNOSIS — J9621 Acute and chronic respiratory failure with hypoxia: Secondary | ICD-10-CM | POA: Diagnosis present

## 2021-12-07 DIAGNOSIS — J9622 Acute and chronic respiratory failure with hypercapnia: Secondary | ICD-10-CM | POA: Diagnosis present

## 2021-12-07 DIAGNOSIS — I10 Essential (primary) hypertension: Secondary | ICD-10-CM | POA: Diagnosis present

## 2021-12-07 DIAGNOSIS — D649 Anemia, unspecified: Secondary | ICD-10-CM | POA: Diagnosis present

## 2021-12-07 DIAGNOSIS — Z9151 Personal history of suicidal behavior: Secondary | ICD-10-CM

## 2021-12-07 DIAGNOSIS — Z8249 Family history of ischemic heart disease and other diseases of the circulatory system: Secondary | ICD-10-CM

## 2021-12-07 DIAGNOSIS — Z9049 Acquired absence of other specified parts of digestive tract: Secondary | ICD-10-CM

## 2021-12-07 DIAGNOSIS — J449 Chronic obstructive pulmonary disease, unspecified: Secondary | ICD-10-CM

## 2021-12-07 DIAGNOSIS — Z888 Allergy status to other drugs, medicaments and biological substances status: Secondary | ICD-10-CM

## 2021-12-07 DIAGNOSIS — E039 Hypothyroidism, unspecified: Secondary | ICD-10-CM | POA: Diagnosis present

## 2021-12-07 DIAGNOSIS — Z720 Tobacco use: Secondary | ICD-10-CM | POA: Diagnosis present

## 2021-12-07 DIAGNOSIS — K219 Gastro-esophageal reflux disease without esophagitis: Secondary | ICD-10-CM | POA: Diagnosis present

## 2021-12-07 LAB — CBC WITH DIFFERENTIAL/PLATELET
Abs Immature Granulocytes: 0.04 10*3/uL (ref 0.00–0.07)
Basophils Absolute: 0.1 10*3/uL (ref 0.0–0.1)
Basophils Relative: 1 %
Eosinophils Absolute: 0.5 10*3/uL (ref 0.0–0.5)
Eosinophils Relative: 5 %
HCT: 37.6 % (ref 36.0–46.0)
Hemoglobin: 11.7 g/dL — ABNORMAL LOW (ref 12.0–15.0)
Immature Granulocytes: 0 %
Lymphocytes Relative: 19 %
Lymphs Abs: 1.9 10*3/uL (ref 0.7–4.0)
MCH: 30.8 pg (ref 26.0–34.0)
MCHC: 31.1 g/dL (ref 30.0–36.0)
MCV: 98.9 fL (ref 80.0–100.0)
Monocytes Absolute: 0.8 10*3/uL (ref 0.1–1.0)
Monocytes Relative: 8 %
Neutro Abs: 7.1 10*3/uL (ref 1.7–7.7)
Neutrophils Relative %: 67 %
Platelets: 183 10*3/uL (ref 150–400)
RBC: 3.8 MIL/uL — ABNORMAL LOW (ref 3.87–5.11)
RDW: 13.4 % (ref 11.5–15.5)
WBC: 10.4 10*3/uL (ref 4.0–10.5)
nRBC: 0 % (ref 0.0–0.2)

## 2021-12-07 LAB — RESP PANEL BY RT-PCR (FLU A&B, COVID) ARPGX2
Influenza A by PCR: NEGATIVE
Influenza B by PCR: NEGATIVE
SARS Coronavirus 2 by RT PCR: NEGATIVE

## 2021-12-07 LAB — COMPREHENSIVE METABOLIC PANEL
ALT: 26 U/L (ref 0–44)
AST: 19 U/L (ref 15–41)
Albumin: 4 g/dL (ref 3.5–5.0)
Alkaline Phosphatase: 61 U/L (ref 38–126)
Anion gap: 5 (ref 5–15)
BUN: 14 mg/dL (ref 8–23)
CO2: 40 mmol/L — ABNORMAL HIGH (ref 22–32)
Calcium: 8.5 mg/dL — ABNORMAL LOW (ref 8.9–10.3)
Chloride: 97 mmol/L — ABNORMAL LOW (ref 98–111)
Creatinine, Ser: 0.87 mg/dL (ref 0.44–1.00)
GFR, Estimated: >60 mL/min (ref >60)
Glucose, Bld: 123 mg/dL — ABNORMAL HIGH (ref 70–99)
Potassium: 3.7 mmol/L (ref 3.5–5.1)
Sodium: 142 mmol/L (ref 135–145)
Total Bilirubin: 0.5 mg/dL (ref 0.3–1.2)
Total Protein: 7.1 g/dL (ref 6.5–8.1)

## 2021-12-07 LAB — TROPONIN I (HIGH SENSITIVITY): Troponin I (High Sensitivity): 12 ng/L (ref <18)

## 2021-12-07 LAB — BRAIN NATRIURETIC PEPTIDE: B Natriuretic Peptide: 46.7 pg/mL (ref 0.0–100.0)

## 2021-12-07 MED ORDER — ADULT MULTIVITAMIN W/MINERALS CH
1.0000 | ORAL_TABLET | Freq: Every day | ORAL | Status: DC
  Administered 2021-12-07 – 2021-12-10 (×4): 1 via ORAL
  Filled 2021-12-07 (×4): qty 1

## 2021-12-07 MED ORDER — ENOXAPARIN SODIUM 40 MG/0.4ML IJ SOSY
40.0000 mg | PREFILLED_SYRINGE | INTRAMUSCULAR | Status: DC
  Administered 2021-12-07 – 2021-12-10 (×4): 40 mg via SUBCUTANEOUS
  Filled 2021-12-07 (×4): qty 0.4

## 2021-12-07 MED ORDER — LEVALBUTEROL HCL 0.63 MG/3ML IN NEBU
0.6300 mg | INHALATION_SOLUTION | Freq: Four times a day (QID) | RESPIRATORY_TRACT | Status: DC | PRN
  Administered 2021-12-07 – 2021-12-09 (×5): 0.63 mg via RESPIRATORY_TRACT
  Filled 2021-12-07 (×5): qty 3

## 2021-12-07 MED ORDER — MAGNESIUM SULFATE 2 GM/50ML IV SOLN
2.0000 g | Freq: Once | INTRAVENOUS | Status: AC
  Administered 2021-12-07: 2 g via INTRAVENOUS
  Filled 2021-12-07: qty 50

## 2021-12-07 MED ORDER — ATORVASTATIN CALCIUM 20 MG PO TABS
20.0000 mg | ORAL_TABLET | Freq: Every day | ORAL | Status: DC
  Administered 2021-12-07 – 2021-12-09 (×3): 20 mg via ORAL
  Filled 2021-12-07: qty 2
  Filled 2021-12-07 (×2): qty 1

## 2021-12-07 MED ORDER — HYDROXYZINE HCL 25 MG PO TABS
25.0000 mg | ORAL_TABLET | Freq: Three times a day (TID) | ORAL | Status: DC
  Administered 2021-12-07 – 2021-12-10 (×10): 25 mg via ORAL
  Filled 2021-12-07 (×10): qty 1

## 2021-12-07 MED ORDER — METHYLPREDNISOLONE SODIUM SUCC 125 MG IJ SOLR
125.0000 mg | Freq: Once | INTRAMUSCULAR | Status: AC
Start: 2021-12-07 — End: 2021-12-07
  Administered 2021-12-07: 125 mg via INTRAVENOUS
  Filled 2021-12-07: qty 2

## 2021-12-07 MED ORDER — PREDNISONE 20 MG PO TABS
40.0000 mg | ORAL_TABLET | Freq: Every day | ORAL | Status: DC
  Administered 2021-12-08: 40 mg via ORAL
  Filled 2021-12-07: qty 2

## 2021-12-07 MED ORDER — IPRATROPIUM-ALBUTEROL 0.5-2.5 (3) MG/3ML IN SOLN
3.0000 mL | Freq: Once | RESPIRATORY_TRACT | Status: AC
  Administered 2021-12-07: 3 mL via RESPIRATORY_TRACT
  Filled 2021-12-07: qty 3

## 2021-12-07 MED ORDER — AMLODIPINE BESYLATE 10 MG PO TABS
10.0000 mg | ORAL_TABLET | Freq: Every day | ORAL | Status: DC
  Administered 2021-12-07 – 2021-12-10 (×4): 10 mg via ORAL
  Filled 2021-12-07: qty 1
  Filled 2021-12-07: qty 2
  Filled 2021-12-07: qty 1
  Filled 2021-12-07: qty 2

## 2021-12-07 MED ORDER — LEVALBUTEROL HCL 1.25 MG/0.5ML IN NEBU: 1.2500 mg | INHALATION_SOLUTION | Freq: Four times a day (QID) | RESPIRATORY_TRACT | Status: DC

## 2021-12-07 MED ORDER — CLONAZEPAM 0.5 MG PO TABS
0.5000 mg | ORAL_TABLET | Freq: Two times a day (BID) | ORAL | Status: DC
  Administered 2021-12-07 – 2021-12-08 (×4): 0.5 mg via ORAL
  Filled 2021-12-07 (×7): qty 1

## 2021-12-07 MED ORDER — DICLOFENAC SODIUM 75 MG PO TBEC
75.0000 mg | DELAYED_RELEASE_TABLET | Freq: Two times a day (BID) | ORAL | Status: DC
  Administered 2021-12-07 – 2021-12-10 (×6): 75 mg via ORAL
  Filled 2021-12-07 (×6): qty 1

## 2021-12-07 MED ORDER — POTASSIUM CHLORIDE CRYS ER 20 MEQ PO TBCR
20.0000 meq | EXTENDED_RELEASE_TABLET | Freq: Two times a day (BID) | ORAL | Status: DC
  Administered 2021-12-07 – 2021-12-10 (×7): 20 meq via ORAL
  Filled 2021-12-07 (×7): qty 1

## 2021-12-07 MED ORDER — ONDANSETRON HCL 4 MG/2ML IJ SOLN: 4.0000 mg | Freq: Four times a day (QID) | INTRAMUSCULAR | Status: DC | PRN

## 2021-12-07 MED ORDER — METHYLPREDNISOLONE SODIUM SUCC 40 MG IJ SOLR
40.0000 mg | Freq: Two times a day (BID) | INTRAMUSCULAR | Status: AC
  Administered 2021-12-07 – 2021-12-08 (×2): 40 mg via INTRAVENOUS
  Filled 2021-12-07 (×2): qty 1

## 2021-12-07 MED ORDER — GABAPENTIN 300 MG PO CAPS
600.0000 mg | ORAL_CAPSULE | Freq: Three times a day (TID) | ORAL | Status: DC
  Administered 2021-12-07 – 2021-12-10 (×10): 600 mg via ORAL
  Filled 2021-12-07 (×10): qty 2

## 2021-12-07 MED ORDER — ACETAMINOPHEN 325 MG PO TABS: 650.0000 mg | ORAL_TABLET | Freq: Four times a day (QID) | ORAL | Status: DC | PRN

## 2021-12-07 MED ORDER — CYCLOBENZAPRINE HCL 10 MG PO TABS: 10.0000 mg | ORAL_TABLET | Freq: Three times a day (TID) | ORAL | Status: DC | PRN

## 2021-12-07 MED ORDER — ACETAMINOPHEN 650 MG RE SUPP: 650.0000 mg | Freq: Four times a day (QID) | RECTAL | Status: DC | PRN

## 2021-12-07 MED ORDER — ROPINIROLE HCL 1 MG PO TABS
2.0000 mg | ORAL_TABLET | Freq: Every day | ORAL | Status: DC
  Administered 2021-12-07 – 2021-12-09 (×3): 2 mg via ORAL
  Filled 2021-12-07 (×3): qty 2

## 2021-12-07 MED ORDER — DOXYCYCLINE HYCLATE 100 MG PO CAPS: 100.0000 mg | ORAL_CAPSULE | Freq: Two times a day (BID) | ORAL | Status: DC

## 2021-12-07 MED ORDER — LEVALBUTEROL HCL 1.25 MG/0.5ML IN NEBU
0.6300 mg | INHALATION_SOLUTION | Freq: Three times a day (TID) | RESPIRATORY_TRACT | Status: DC
  Administered 2021-12-07 – 2021-12-09 (×3): 0.63 mg via RESPIRATORY_TRACT
  Filled 2021-12-07 (×6): qty 0.5

## 2021-12-07 MED ORDER — NICOTINE 21 MG/24HR TD PT24
21.0000 mg | MEDICATED_PATCH | Freq: Every day | TRANSDERMAL | Status: DC
  Administered 2021-12-07 – 2021-12-10 (×4): 21 mg via TRANSDERMAL
  Filled 2021-12-07 (×4): qty 1

## 2021-12-07 MED ORDER — PANTOPRAZOLE SODIUM 40 MG PO TBEC
40.0000 mg | DELAYED_RELEASE_TABLET | Freq: Every day | ORAL | Status: DC
  Administered 2021-12-07 – 2021-12-10 (×4): 40 mg via ORAL
  Filled 2021-12-07 (×5): qty 1

## 2021-12-07 MED ORDER — FUROSEMIDE 20 MG PO TABS
20.0000 mg | ORAL_TABLET | Freq: Every day | ORAL | Status: DC
  Administered 2021-12-07 – 2021-12-10 (×4): 20 mg via ORAL
  Filled 2021-12-07 (×4): qty 1

## 2021-12-07 MED ORDER — LEVOTHYROXINE SODIUM 25 MCG PO TABS
137.0000 ug | ORAL_TABLET | Freq: Every day | ORAL | Status: DC
  Administered 2021-12-08 – 2021-12-10 (×3): 137 ug via ORAL
  Filled 2021-12-07 (×4): qty 1

## 2021-12-07 MED ORDER — PALIPERIDONE ER 3 MG PO TB24
3.0000 mg | ORAL_TABLET | Freq: Every morning | ORAL | Status: DC
  Administered 2021-12-08 – 2021-12-10 (×3): 3 mg via ORAL
  Filled 2021-12-07 (×3): qty 1

## 2021-12-07 MED ORDER — ONDANSETRON HCL 4 MG PO TABS: 4.0000 mg | ORAL_TABLET | Freq: Four times a day (QID) | ORAL | Status: DC | PRN

## 2021-12-07 MED ORDER — ALBUTEROL SULFATE (2.5 MG/3ML) 0.083% IN NEBU: 2.5000 mg | INHALATION_SOLUTION | RESPIRATORY_TRACT | Status: DC | PRN

## 2021-12-07 MED ORDER — IPRATROPIUM-ALBUTEROL 0.5-2.5 (3) MG/3ML IN SOLN
3.0000 mL | Freq: Three times a day (TID) | RESPIRATORY_TRACT | Status: DC
  Administered 2021-12-07: 3 mL via RESPIRATORY_TRACT
  Filled 2021-12-07: qty 3

## 2021-12-07 MED ORDER — DM-GUAIFENESIN ER 30-600 MG PO TB12
1.0000 | ORAL_TABLET | Freq: Two times a day (BID) | ORAL | Status: DC
  Administered 2021-12-07 – 2021-12-10 (×7): 1 via ORAL
  Filled 2021-12-07 (×7): qty 1

## 2021-12-07 MED ORDER — SERTRALINE HCL 100 MG PO TABS
100.0000 mg | ORAL_TABLET | Freq: Every day | ORAL | Status: DC
  Administered 2021-12-07 – 2021-12-10 (×4): 100 mg via ORAL
  Filled 2021-12-07: qty 2
  Filled 2021-12-07: qty 1
  Filled 2021-12-07: qty 2
  Filled 2021-12-07: qty 1

## 2021-12-07 MED ORDER — MIRTAZAPINE 15 MG PO TABS
15.0000 mg | ORAL_TABLET | Freq: Every day | ORAL | Status: DC
  Administered 2021-12-07 – 2021-12-09 (×3): 15 mg via ORAL
  Filled 2021-12-07: qty 1
  Filled 2021-12-07: qty 2
  Filled 2021-12-07: qty 1

## 2021-12-07 NOTE — ED Triage Notes (Signed)
Pt BIBA, Per EMS: Pt coming from home c/o [facility], wheezing in all lobes. Hx COPD. Pt given 2 duonebs en route. Pt wears 4L at baseline. 91% on 4L before duonebs.

## 2021-12-07 NOTE — H&P (Signed)
History and Physical    Audrey Meyer:[id number] DOB: [birth year] DOA: 12/07/2021  PCP: Ronald Pippins, NP   Patient coming from: Home.   I have personally briefly reviewed patient's old medical records in [place]  Chief Complaint: Shortness of breath.  HPI: Audrey Meyer is a 66 y.o. female with medical history significant of GERD, breast cancer, COPD, chronic respiratory failure on home oxygen at 4 LPM, active smoker, hyperlipidemia, hypertension, hyponatremia, hypothyroidism, schizoaffective disorder, history of suicide attempt who was admitted on 11/25/2021 for COPD exacerbation, discharged home on 11/26/2021.  She did not want to spend Christmas Eve for placement in a hospital with returning to the hospital due to progressively worsening dyspnea associated with wheezing.  The patient is getting short of breath just walking between rooms at home.  She denied fever, chills, sore throat, hemoptysis or pleuritic chest pain.  No typical CP, palpitations, diaphoresis, PND, orthopnea or pitting edema lower extremities.  Denied abdominal pain, nausea, emesis, diarrhea, constipation, melena or hematochezia.  No dysuria, frequency or hematuria.  No polyuria, polydipsia, polyphagia or blurred vision.  ED Course: Initial vital signs temperature 97.5 F, pulse 80, respirations 26, BP 144/70 mmHg O2 sat 98% on 4 LPM via nasal cannula.  The patient received Solu-Medrol, bronchodilators and magnesium sulfate in the emergency department.  Lab work: CBC for white count of 10.4, hemoglobin 11.7 g/dL platelets 183.  Troponin and BNP were normal.  CMP showed a chloride of 97 and CO2 of 40 mmol/DL.  Anion gap was normal.  Glucose 123 and calcium 8.5 mg/dL.  Sodium, potassium, hepatic and renal function were normal.  Imaging: One-view portable chest radiograph show a hazy opacity in the lung bases may reflect dependent edema or layering pleural effusion.  Study was limited due to the patient's  rotation.  Please see image and full radiology report for further details.  Review of Systems: As per HPI otherwise all other systems reviewed and are negative.  Past Medical History:  Diagnosis Date   Acid reflux    Breast cancer (HCC)    COPD (chronic obstructive pulmonary disease) ([place])    Drug overdose 04/24/2017   High cholesterol    Hypertension    Hyponatremia 01/31/2017   Hypothyroid    Schizoaffective disorder    Suicide attempt ([place]) 04/25/2017    Past Surgical History:  Procedure Laterality Date   APPENDECTOMY     BREAST LUMPECTOMY Left 2004   MASTECTOMY, PARTIAL     TUBAL LIGATION      Social History  reports that she has been smoking cigars. She has never used smokeless tobacco. She reports current drug use. Drug: Marijuana. She reports that she does not drink alcohol.  Allergies  Allergen Reactions   Budesonide Shortness Of Breath   Promethazine Nausea And Vomiting   Lisinopril Other (See Comments)    "Dizzy spells"    Family History  Problem Relation Age of Onset   Heart attack Father    Hypertension Father    Cancer Other    Prior to Admission medications   Medication Sig Start Date End Date Taking? Authorizing Provider  albuterol (VENTOLIN HFA) 108 (90 Base) MCG/ACT inhaler Inhale 2 puffs into the lungs every 4 (four) hours as needed for wheezing or shortness of breath. 11/16/21  Yes Pollina, Gwenyth Allegra, MD  amLODipine (NORVASC) 10 MG tablet Take 10 mg by mouth daily. 04/17/17  Yes [provider]  atorvastatin (LIPITOR) 20 MG tablet Take 1 tablet (20 mg  total) by mouth at bedtime. 05/05/16  Yes Withrow, Elyse Jarvis, FNP  clonazePAM (KLONOPIN) 0.5 MG tablet Take 0.5 mg by mouth 2 (two) times daily. 11/20/21  Yes [provider]  cyclobenzaprine (FLEXERIL) 10 MG tablet Take 10 mg by mouth 3 (three) times daily as needed for muscle spasms. 07/21/21  Yes [provider]  dextromethorphan-guaiFENesin (MUCINEX DM) 30-600 MG 12hr tablet  Take 1 tablet by mouth 2 (two) times daily.   Yes [provider]  diclofenac (VOLTAREN) 75 MG EC tablet Take 75 mg by mouth in the morning and at bedtime. 10/15/21  Yes [provider]  doxycycline (VIBRAMYCIN) 100 MG capsule Take 1 capsule (100 mg total) by mouth 2 (two) times daily. 11/16/21  Yes Pollina, Gwenyth Allegra, MD  fluticasone-salmeterol (ADVAIR HFA) [phone] MCG/ACT inhaler Inhale 2 puffs into the lungs in the morning and at bedtime. 09/28/21  Yes [provider]  furosemide (LASIX) 20 MG tablet Take 20 mg by mouth daily. 05/01/17  Yes [provider]  gabapentin (NEURONTIN) 600 MG tablet Take 600 mg by mouth 3 (three) times daily.   Yes [provider]  hydrOXYzine (VISTARIL) 25 MG capsule Take 25 mg by mouth 3 (three) times daily. 05/05/17  Yes [provider]  levalbuterol (XOPENEX) 0.63 MG/3ML nebulizer solution Take 3 mLs by nebulization 4 (four) times daily as needed for wheezing or shortness of breath. 11/13/21  Yes [provider]  levothyroxine (SYNTHROID) 137 MCG tablet Take 137 mcg by mouth daily before breakfast.   Yes [provider]  mirtazapine (REMERON) 15 MG tablet Take 15 mg by mouth at bedtime. 05/05/17  Yes [provider]  Multiple Vitamin (MULTIVITAMIN WITH MINERALS) TABS tablet Take 1 tablet by mouth daily.   Yes [provider]  paliperidone (INVEGA) 3 MG 24 hr tablet Take 3 mg by mouth every morning. 10/29/21  Yes [provider]  pantoprazole (PROTONIX) 40 MG tablet Take 40 mg by mouth daily. 09/28/21  Yes [provider]  potassium chloride SA (K-DUR,KLOR-CON) 20 MEQ tablet Take 20 mEq by mouth 2 (two) times daily.    Yes [provider]  rOPINIRole (REQUIP) 1 MG tablet Take 2 mg by mouth at bedtime.   Yes [provider]  sertraline (ZOLOFT) 100 MG tablet Take 100 mg by mouth daily. 05/05/17  Yes [provider]    Physical  Exam: Vitals:   12/07/21 0600 12/07/21 0615 12/07/21 0630 12/07/21 1030  BP: (!) 127/59 (!) 111/52 (!) 136/54 (!) 154/67  Pulse: 68 65 71 87  Resp: 18 19 18 18   Temp:      TempSrc:      SpO2: 99% 100% 95% 91%    Constitutional: NAD, calm, comfortable Eyes: PERRL, lids and conjunctivae normal ENMT: Nasal cannula in place.  Mucous membranes are moist. Posterior pharynx clear of any exudate or lesions.  Neck: normal, supple, no masses, no thyromegaly Respiratory: Decreased breath sounds with bilateral rhonchi in bilateral wheezing, no crackles. Normal respiratory effort. No accessory muscle use.  Cardiovascular: Regular rate and rhythm, no murmurs / rubs / gallops. No extremity edema. 2+ pedal pulses. No carotid bruits.  Abdomen: Obese, no distention.  Soft, no tenderness, no masses palpated. No hepatosplenomegaly. Bowel sounds positive.  Musculoskeletal: no clubbing / cyanosis.  Good ROM, no contractures. Normal muscle tone.  Skin: no acute rashes, lesions, ulcers on limited dermatological examination. Neurologic: CN 2-12 grossly intact. Sensation intact, DTR normal. Strength 5/5 in all 4.  Psychiatric:  Normal judgment and insight. Alert and oriented x 3. Normal mood.   Labs on Admission: I have personally reviewed following labs and imaging studies  CBC: Recent Labs  Lab 12/07/21 0446  WBC 10.4  NEUTROABS 7.1  HGB 11.7*  HCT 37.6  MCV 98.9  PLT 431    Basic Metabolic Panel: Recent Labs  Lab 12/07/21 0446  NA 142  K 3.7  CL 97*  CO2 40*  GLUCOSE 123*  BUN 14  CREATININE 0.87  CALCIUM 8.5*    GFR: Estimated Creatinine Clearance: 60.7 mL/min (by C-G formula based on SCr of 0.87 mg/dL).  Liver Function Tests: Recent Labs  Lab 12/07/21 0446  AST 19  ALT 26  ALKPHOS 61  BILITOT 0.5  PROT 7.1  ALBUMIN 4.0   Radiological Exams on Admission: DG Chest Port 1 View  Result Date: 12/07/2021 CLINICAL DATA:  Shortness of breath. EXAM: PORTABLE CHEST 1 VIEW  COMPARISON:  11/25/2021 FINDINGS: 0531 hours. Lungs are hyperexpanded. Patient is rotated to the left. The cardio pericardial silhouette is enlarged. Interstitial markings are diffusely coarsened with chronic features. As before, there is hazy opacity in the lung bases, potentially dependent edema or layering pleural effusion. Left lung base largely obscured by the heart on this rotated film. Bones are diffusely demineralized. Telemetry leads overlie the chest. IMPRESSION: 1. Features compatible with emphysema. 2. Hazy opacity in the lung bases may reflect dependent edema or layering pleural effusion. Study limited by patient rotation. Electronically Signed   By: Misty Stanley M.D.   On: 12/07/2021 06:08    EKG: Independently reviewed.  Vent. rate 84 BPM PR interval 135 ms QRS duration 103 ms QT/QTcB 379/448 ms P-R-T axes 81 94 46 Sinus rhythm Ventricular bigeminy Right axis deviation Borderline low voltage, extremity leads  Assessment/Plan Principal Problem:   Acute on chronic respiratory failure with hypercapnia (HCC) In the setting of   COPD with acute exacerbation (HCC) Observation/telemetry. Continue supplemental oxygen. Scheduled and as needed bronchodilators. Continue methylprednisolone. Prednisone taper in AM. Tobacco cessation advised  Active Problems:   Hypothyroidism Continue levothyroxine 137 mcg p.o. daily.    Essential hypertension Continue amlodipine 10 mg p.o. daily. Continue furosemide 20 mg p.o. daily. Monitor BP, renal function electrolytes.    Tobacco abuse NicoDerm daily.    GERD (gastroesophageal reflux disease) Continue PPI.    Hypocalcemia Recheck calcium level in AM.    Normocytic anemia Monitor H&H.    Schizoaffective disorder, depressive type (HCC) Continue clonazepam, hydroxyzine, mirtazapine, sertraline and paliperidone. Follow-up with PCP and behavioral health as scheduled.   DVT prophylaxis: Lovenox SQ. Code Status:   Full  code. Family Communication:   Disposition Plan:   Patient is from:  Home.  Anticipated DC to:  Home.  Anticipated DC date:  12/08/2021 or  12/09/2021.  Anticipated DC barriers: Clinical status.  Consults called:   Admission status:  Observation/telemetry.  Severity of Illness: High severity due to acute on chronic respiratory failure in the setting of COPD exacerbation.  Reubin Milan MD [facility]  How to contact the [facility] Attending or Consulting provider [place] or covering provider during after hours [place], for this patient?   Check the care team in [facility] and look for a) attending/consulting [facility] provider listed and b) the [facility] team listed Log into [email or web address] and use [place]'s universal password to access. If you do not have the password, please contact the hospital operator. Locate the [facility] provider you are looking for under [facility]  and page to a number that you can be directly reached. If you still have difficulty reaching the provider, please page the [facility] (Director on Call) for the Hospitalists listed on amion for assistance.  12/07/2021, 11:08 AM   This document was prepared using Dragon voice recognition software and may contain some unintended transcription errors.

## 2021-12-07 NOTE — ED Notes (Signed)
Patient given meal tray.

## 2021-12-07 NOTE — ED Provider Notes (Signed)
[facility] DEPT Provider Note   CSN: [id number] Arrival date & time: 12/07/21  0413     History  Chief Complaint  Patient presents with   Shortness of Breath    Audrey Meyer is a 66 y.o. female.  Patient is a 66 year old female with past medical history of COPD on home oxygen 4 L by nasal cannula, schizoaffective disorder.  Patient presenting today for evaluation of shortness of breath.  Patient called 911 due to difficulty breathing.  I am told she was 91% on 4 L nasal cannula when EMS arrived.  She received 2 duo nebs in route.  Patient denies to me she is experiencing any chest pain, fever, or productive cough.  She denies any COVID contacts or exposures.  The history is provided by the patient.  Shortness of Breath Severity:  Moderate Onset quality:  Gradual Duration:  2 days Timing:  Constant Progression:  Worsening Chronicity:  Recurrent Context: not URI   Relieved by:  Nothing Worsened by:  Activity and movement     Home Medications Prior to Admission medications   Medication Sig Start Date End Date Taking? Authorizing Provider  albuterol (VENTOLIN HFA) 108 (90 Base) MCG/ACT inhaler Inhale 2 puffs into the lungs every 4 (four) hours as needed for wheezing or shortness of breath. 11/16/21   Orpah Greek, MD  amLODipine (NORVASC) 10 MG tablet Take 10 mg by mouth daily. 04/17/17   [provider]  atorvastatin (LIPITOR) 20 MG tablet Take 1 tablet (20 mg total) by mouth at bedtime. 05/05/16   Withrow, Elyse Jarvis, FNP  clonazePAM (KLONOPIN) 0.5 MG tablet Take 0.5 mg by mouth 2 (two) times daily. 11/20/21   [provider]  cyclobenzaprine (FLEXERIL) 10 MG tablet Take 10 mg by mouth 3 (three) times daily as needed for muscle spasms. 07/21/21   [provider]  dextromethorphan-guaiFENesin (MUCINEX DM) 30-600 MG 12hr tablet Take 1 tablet by mouth 2 (two) times daily.    [provider]  diclofenac  (VOLTAREN) 75 MG EC tablet Take 75 mg by mouth in the morning and at bedtime. 10/15/21   [provider]  doxycycline (VIBRAMYCIN) 100 MG capsule Take 1 capsule (100 mg total) by mouth 2 (two) times daily. 11/16/21   Orpah Greek, MD  fluticasone-salmeterol (ADVAIR HFA) 132-44 MCG/ACT inhaler Inhale 2 puffs into the lungs in the morning and at bedtime. 09/28/21   [provider]  furosemide (LASIX) 20 MG tablet Take 20 mg by mouth daily. 05/01/17   [provider]  gabapentin (NEURONTIN) 600 MG tablet Take 600 mg by mouth 3 (three) times daily.    [provider]  hydrOXYzine (VISTARIL) 25 MG capsule Take 25 mg by mouth 3 (three) times daily. 05/05/17   [provider]  levalbuterol Penne Lash) 0.63 MG/3ML nebulizer solution Take 3 mLs by nebulization 4 (four) times daily as needed for wheezing or shortness of breath. 11/13/21   [provider]  levothyroxine (SYNTHROID) 137 MCG tablet Take 137 mcg by mouth daily before breakfast.    [provider]  mirtazapine (REMERON) 15 MG tablet Take 15 mg by mouth at bedtime. 05/05/17   [provider]  Multiple Vitamin (MULTIVITAMIN WITH MINERALS) TABS tablet Take 1 tablet by mouth daily.    [provider]  paliperidone (INVEGA) 3 MG 24 hr tablet Take 3 mg by mouth every morning. 10/29/21   [provider]  pantoprazole (PROTONIX) 40 MG tablet Take 40 mg by  mouth daily. 09/28/21   [provider]  potassium chloride SA (K-DUR,KLOR-CON) 20 MEQ tablet Take 20 mEq by mouth 2 (two) times daily.     [provider]  rOPINIRole (REQUIP) 1 MG tablet Take 2 mg by mouth at bedtime.    [provider]  sertraline (ZOLOFT) 100 MG tablet Take 100 mg by mouth daily. 05/05/17   [provider]      Allergies    Budesonide, Promethazine, and Lisinopril    Review of Systems   Review of Systems  Respiratory:  Positive for shortness of breath.    All other systems reviewed and are negative.  Physical Exam Updated Vital Signs BP (!) 144/70    Pulse 80    Temp (!) 97.5 F (36.4 C) (Oral)    Resp (!) 26    SpO2 98%  Physical Exam Vitals and nursing note reviewed.  Constitutional:      General: She is not in acute distress.    Appearance: She is well-developed. She is not diaphoretic.  HENT:     Head: Normocephalic and atraumatic.  Cardiovascular:     Rate and Rhythm: Normal rate and regular rhythm.     Heart sounds: No murmur heard.   No friction rub. No gallop.  Pulmonary:     Effort: Pulmonary effort is normal. No respiratory distress.     Breath sounds: Examination of the right-middle field reveals rhonchi. Examination of the left-middle field reveals rhonchi. Rhonchi present. No wheezing.     Comments: There are bilateral rhonchi present Abdominal:     General: Bowel sounds are normal. There is no distension.     Palpations: Abdomen is soft.     Tenderness: There is no abdominal tenderness.  Musculoskeletal:        General: Normal range of motion.     Cervical back: Normal range of motion and neck supple.     Right lower leg: No tenderness. No edema.     Left lower leg: No tenderness. No edema.  Skin:    General: Skin is warm and dry.  Neurological:     Mental Status: She is alert and oriented to person, place, and time.    ED Results / Procedures / Treatments   Labs (all labs ordered are listed, but only abnormal results are displayed) Labs Reviewed  RESP PANEL BY RT-PCR (FLU A&B, COVID) [id number]  COMPREHENSIVE METABOLIC PANEL  CBC WITH DIFFERENTIAL/PLATELET  BRAIN NATRIURETIC PEPTIDE  TROPONIN I (HIGH SENSITIVITY)    EKG EKG Interpretation  Date/Time:  Tuesday December 07 2021 04:32:02 EST Ventricular Rate:  84 PR Interval:  135 QRS Duration: 103 QT Interval:  379 QTC Calculation: 448 R Axis:   94 Text Interpretation: Sinus rhythm Ventricular bigeminy Right axis deviation Borderline low voltage,  extremity leads Confirmed by Veryl Speak [phone]) on 12/07/2021 4:47:13 AM  Radiology No results found.  Procedures Procedures  Continuous cardiac monitoring  Medications Ordered in ED Medications  methylPREDNISolone sodium succinate (SOLU-MEDROL) 125 mg/2 mL injection 125 mg (has no administration in time range)  ipratropium-albuterol (DUONEB) 0.5-2.5 (3) MG/3ML nebulizer solution 3 mL (has no administration in time range)  magnesium sulfate IVPB 2 g 50 mL (has no administration in time range)    ED Course/ Medical Decision Making/ A&P  This patient presents to the ED for concern of shortness of breath, this involves an extensive number of treatment options, and is a complaint that carries with it a high risk of complications and morbidity.  The differential diagnosis includes COPD exacerbation, CHF exacerbation, pneumonia   Co morbidities that complicate the patient evaluation  COPD, schizoaffective disorder   Additional history obtained:  History taken from the patient and no additional history needed No external records necessary   Lab Tests:  I Ordered, and personally interpreted labs.  The pertinent results include: Slight leukocytosis, but otherwise consistent with baseline.  Respiratory panel is negative.   Imaging Studies ordered:  I ordered imaging studies including chest x-ray I independently visualized and interpreted imaging which showed findings consistent with COPD, but no obvious infiltrate I agree with the radiologist interpretation   Cardiac Monitoring:  The patient was maintained on a cardiac monitor.  I personally viewed and interpreted the cardiac monitored which showed an underlying rhythm of: Sinus rhythm   Medicines ordered and prescription drug management:  I ordered medication including Solu-Medrol and DuoNebs for difficulty breathing/COPD Reevaluation of the patient after these medicines showed that the patient improved I have reviewed  the patients home medicines and have made adjustments as needed   Test Considered:  No additional test considered or indicated   Critical Interventions:  Breathing treatments and steroids for treatment of COPD   Consultations Obtained:  I requested consultation with the hospitalist,  and discussed lab and imaging findings as well as pertinent plan - they recommend: Admission for further treatment   Problem List / ED Course:  Patient with history of COPD presenting with shortness of breath and increased oxygen requirement.  She received DuoNeb's by EMS, but continues to wheeze and feel short of breath.  Patient given steroids and additional breathing treatments here and is feeling somewhat better, but does not seem adequate for discharge.  Patient will be admitted to the hospitalist service for further treatment.   Reevaluation:  After the interventions noted above, I reevaluated the patient and found that they have :improved   Social Determinants of Health:  Schizoaffective disorder   Dispostion:  After consideration of the diagnostic results and the patients response to treatment, I feel that the patent would benefit from admission and additional management of her COPD.  CRITICAL CARE Performed by: Veryl Speak Total critical care time: 35 minutes Critical care time was exclusive of separately billable procedures and treating other patients. Critical care was necessary to treat or prevent imminent or life-threatening deterioration. Critical care was time spent personally by me on the following activities: development of treatment plan with patient and/or surrogate as well as nursing, discussions with consultants, evaluation of patient's response to treatment, examination of patient, obtaining history from patient or surrogate, ordering and performing treatments and interventions, ordering and review of laboratory studies, ordering and review of radiographic studies, pulse  oximetry and re-evaluation of patient's condition.     Final Clinical Impression(s) / ED Diagnoses Final diagnoses:  None    Rx / DC Orders ED Discharge Orders     None         Veryl Speak, MD 12/08/21 [phone]

## 2021-12-07 NOTE — ED Notes (Signed)
Respiratory called for breathing treatment.

## 2021-12-07 NOTE — ED Notes (Signed)
Patient requesting ibuprofen for headache. Provider aware.

## 2021-12-08 DIAGNOSIS — J441 Chronic obstructive pulmonary disease with (acute) exacerbation: Secondary | ICD-10-CM | POA: Diagnosis not present

## 2021-12-08 LAB — CBC
HCT: 34.6 % — ABNORMAL LOW (ref 36.0–46.0)
Hemoglobin: 11.2 g/dL — ABNORMAL LOW (ref 12.0–15.0)
MCH: 30.9 pg (ref 26.0–34.0)
MCHC: 32.4 g/dL (ref 30.0–36.0)
MCV: 95.3 fL (ref 80.0–100.0)
Platelets: 190 10*3/uL (ref 150–400)
RBC: 3.63 MIL/uL — ABNORMAL LOW (ref 3.87–5.11)
RDW: 13.2 % (ref 11.5–15.5)
WBC: 12.6 10*3/uL — ABNORMAL HIGH (ref 4.0–10.5)
nRBC: 0 % (ref 0.0–0.2)

## 2021-12-08 LAB — BASIC METABOLIC PANEL
Anion gap: 8 (ref 5–15)
BUN: 17 mg/dL (ref 8–23)
CO2: 35 mmol/L — ABNORMAL HIGH (ref 22–32)
Calcium: 9.7 mg/dL (ref 8.9–10.3)
Chloride: 95 mmol/L — ABNORMAL LOW (ref 98–111)
Creatinine, Ser: 0.84 mg/dL (ref 0.44–1.00)
GFR, Estimated: >60 mL/min (ref >60)
Glucose, Bld: 173 mg/dL — ABNORMAL HIGH (ref 70–99)
Potassium: 3.9 mmol/L (ref 3.5–5.1)
Sodium: 138 mmol/L (ref 135–145)

## 2021-12-08 LAB — HIV ANTIBODY (ROUTINE TESTING W REFLEX): HIV Screen 4th Generation wRfx: NONREACTIVE

## 2021-12-08 MED ORDER — IBUPROFEN 200 MG PO TABS: 400.0000 mg | ORAL_TABLET | Freq: Four times a day (QID) | ORAL | Status: DC | PRN

## 2021-12-08 MED ORDER — HYDRALAZINE HCL 20 MG/ML IJ SOLN
10.0000 mg | Freq: Four times a day (QID) | INTRAMUSCULAR | Status: DC | PRN
  Administered 2021-12-08 – 2021-12-09 (×2): 10 mg via INTRAVENOUS
  Filled 2021-12-08 (×2): qty 1

## 2021-12-08 MED ORDER — METHYLPREDNISOLONE SODIUM SUCC 40 MG IJ SOLR
40.0000 mg | Freq: Two times a day (BID) | INTRAMUSCULAR | Status: DC
  Administered 2021-12-08: 40 mg via INTRAVENOUS
  Filled 2021-12-08: qty 1

## 2021-12-08 MED ORDER — METHYLPREDNISOLONE SODIUM SUCC 125 MG IJ SOLR
60.0000 mg | Freq: Two times a day (BID) | INTRAMUSCULAR | Status: DC
  Administered 2021-12-08 – 2021-12-10 (×4): 60 mg via INTRAVENOUS
  Filled 2021-12-08 (×4): qty 2

## 2021-12-08 NOTE — Progress Notes (Addendum)
PROGRESS NOTE    Audrey Meyer  MWU:[id number] DOB: [birth year] DOA: 12/07/2021  PCP: Talitha Givens, NP   Brief Narrative:  This 66 years old female with PMH significant for GERD, breast cancer, COPD, chronic respiratory failure on home oxygen at 4 L/min, active smoker, hyperlipidemia, hypertension, hyponatremia, hypothyroidism, schizoaffective disorder, history of suicidal attempt who was admitted on 11/25/2021 for COPD exacerbation,  discharged home on next day 11/26/2021.  She did not want to spend Christmas eve in the hospital so she left for home.  Patient reports having worsening shortness of breath associated with wheezing. Patient was getting short of breath just walking between rooms at home. Patient is admitted for COPD exacerbation started on IV Solu-Medrol and nebulized bronchodilators. Assessment & Plan:   Principal Problem:   COPD with acute exacerbation (HCC) Active Problems:   Hypothyroidism   Schizoaffective disorder, depressive type (HCC)   Essential hypertension   Tobacco abuse   GERD (gastroesophageal reflux disease)   Acute on chronic respiratory failure with hypercapnia (HCC)   Hypocalcemia   Normocytic anemia  Acute on chronic hypoxic and hypercapnic respiratory failure secondary to COPD exacerbation: Patient presented with worsening shortness of breath with wheezing. Chest x-ray shows finding consistent with emphysema. Continue supplemental oxygen to keep saturation above 90% Continue scheduled and as needed bronchodilators. Continue IV Solu-Medrol 60 mg every 12 hr. Start tapering with prednisone from tomorrow Tobacco cessation advised  Hypothyroidism: Continue levothyroxine  Essential hypertension: Continue amlodipine, furosemide.  GERD: Continue pantoprazole  Schizoaffective disorder: Continue hydroxyzine, Remeron, Zoloft, clonazepam, paliperidone Outpatient follow-up with psychiatry as scheduled.  Tobacco abuse: Continue nicotine  patch   DVT prophylaxis: Lovenox Code Status: Full code Family Communication: No family at bedside Disposition Plan:   Status is: Observation  The patient remains OBS appropriate and will d/c before 2 midnights.  Patient admitted for COPD exacerbation requiring IV Solu-Medrol and nebulized bronchodilators.    Consultants:  None  Procedures: None Antimicrobials:   Anti-infectives (From admission, onward)    Start     Dose/Rate Route Frequency Ordered Stop   12/07/21 1045  doxycycline (VIBRAMYCIN) capsule 100 mg  Status:  Discontinued        100 mg Oral 2 times daily 12/07/21 1035 12/07/21 1044       Subjective: Patient was seen and examined at bedside.  Overnight events noted.   Patient continues to feel short of breath, reports feeling slightly better,  still has some chest tightness.  Objective: Vitals:   12/08/21 1330 12/08/21 1337 12/08/21 1400 12/08/21 1441  BP:   (!) 147/81 (!) 143/61  Pulse: 82  92 76  Resp: 20  (!) 23 (!) 22  Temp:   98 F (36.7 C) (!) 97.5 F (36.4 C)  TempSrc:   Oral Oral  SpO2: 99% 98% 95% 94%  Weight:    78.1 kg  Height:    5\' 2"  (1.575 m)   No intake or output data in the 24 hours ending 12/08/21 1546 Filed Weights   12/08/21 1441  Weight: 78.1 kg    Examination:  General exam: Appears comfortable, not in any acute distress. Respiratory system: Wheezing noted bilaterally, no crackles, respiratory effort normal.  RR 17 Cardiovascular system: S1-S2 heard, regular rate and rhythm, no murmur. Gastrointestinal system: Abdomen is soft, nontender, nondistended, BS+ Central nervous system: Alert and oriented x 3. No focal neurological deficits. Extremities: No edema, no cyanosis, no clubbing. Skin: No rashes, lesions or ulcers Psychiatry: Judgement and insight appear normal. Mood &  affect appropriate.     Data Reviewed: I have personally reviewed following labs and imaging studies  CBC: Recent Labs  Lab 12/07/21 0446  12/08/21 0222  WBC 10.4 12.6*  NEUTROABS 7.1  --   HGB 11.7* 11.2*  HCT 37.6 34.6*  MCV 98.9 95.3  PLT 183 190   Basic Metabolic Panel: Recent Labs  Lab 12/07/21 0446 12/08/21 0222  NA 142 138  K 3.7 3.9  CL 97* 95*  CO2 40* 35*  GLUCOSE 123* 173*  BUN 14 17  CREATININE 0.87 0.84  CALCIUM 8.5* 9.7   GFR: Estimated Creatinine Clearance: 62.9 mL/min (by C-G formula based on SCr of 0.84 mg/dL). Liver Function Tests: Recent Labs  Lab 12/07/21 0446  AST 19  ALT 26  ALKPHOS 61  BILITOT 0.5  PROT 7.1  ALBUMIN 4.0   No results for input(s): LIPASE, AMYLASE in the last 168 hours. No results for input(s): AMMONIA in the last 168 hours. Coagulation Profile: No results for input(s): INR, PROTIME in the last 168 hours. Cardiac Enzymes: No results for input(s): CKTOTAL, CKMB, CKMBINDEX, TROPONINI in the last 168 hours. BNP (last 3 results) No results for input(s): PROBNP in the last 8760 hours. HbA1C: No results for input(s): HGBA1C in the last 72 hours. CBG: No results for input(s): GLUCAP in the last 168 hours. Lipid Profile: No results for input(s): CHOL, HDL, LDLCALC, TRIG, CHOLHDL, LDLDIRECT in the last 72 hours. Thyroid Function Tests: No results for input(s): TSH, T4TOTAL, FREET4, T3FREE, THYROIDAB in the last 72 hours. Anemia Panel: No results for input(s): VITAMINB12, FOLATE, FERRITIN, TIBC, IRON, RETICCTPCT in the last 72 hours. Sepsis Labs: No results for input(s): PROCALCITON, LATICACIDVEN in the last 168 hours.  Recent Results (from the past 240 hour(s))  Resp Panel by RT-PCR (Flu A&B, Covid) Nasopharyngeal Swab     Status: None   Collection Time: 12/07/21  5:00 AM   Specimen: Nasopharyngeal Swab; Nasopharyngeal(NP) swabs in vial transport medium  Result Value Ref Range Status   SARS Coronavirus 2 by RT PCR NEGATIVE NEGATIVE Final    Comment: (NOTE) SARS-CoV-2 target nucleic acids are NOT DETECTED.  The SARS-CoV-2 RNA is generally detectable in upper  respiratory specimens during the acute phase of infection. The lowest concentration of SARS-CoV-2 viral copies this assay can detect is 138 copies/mL. A negative result does not preclude SARS-Cov-2 infection and should not be used as the sole basis for treatment or other patient management decisions. A negative result may occur with  improper specimen collection/handling, submission of specimen other than nasopharyngeal swab, presence of viral mutation(s) within the areas targeted by this assay, and inadequate number of viral copies(<138 copies/mL). A negative result must be combined with clinical observations, patient history, and epidemiological information. The expected result is Negative.  Fact Sheet for Patients:  BloggerCourse.com  Fact Sheet for Healthcare Providers:  SeriousBroker.it  This test is no t yet approved or cleared by the [place] FDA and  has been authorized for detection and/or diagnosis of SARS-CoV-2 by FDA under an Emergency Use Authorization (EUA). This EUA will remain  in effect (meaning this test can be used) for the duration of the COVID-19 declaration under Section 564(b)(1) of the Act, 21 U.S.C.section 360bbb-3(b)(1), unless the authorization is terminated  or revoked sooner.       Influenza A by PCR NEGATIVE NEGATIVE Final   Influenza B by PCR NEGATIVE NEGATIVE Final    Comment: (NOTE) The Xpert Xpress SARS-CoV-2/FLU/RSV plus assay is intended as an  aid in the diagnosis of influenza from Nasopharyngeal swab specimens and should not be used as a sole basis for treatment. Nasal washings and aspirates are unacceptable for Xpert Xpress SARS-CoV-2/FLU/RSV testing.  Fact Sheet for Patients: BloggerCourse.com  Fact Sheet for Healthcare Providers: SeriousBroker.it  This test is not yet approved or cleared by the [place] FDA and has been  authorized for detection and/or diagnosis of SARS-CoV-2 by FDA under an Emergency Use Authorization (EUA). This EUA will remain in effect (meaning this test can be used) for the duration of the COVID-19 declaration under Section 564(b)(1) of the Act, 21 U.S.C. section 360bbb-3(b)(1), unless the authorization is terminated or revoked.  Performed at [facility], [address] [place], [place] [address]     Radiology Studies: [facility] Chest Port 1 View  Result Date: 12/07/2021 CLINICAL DATA:  Shortness of breath. EXAM: PORTABLE CHEST 1 VIEW COMPARISON:  11/25/2021 FINDINGS: 0531 hours. Lungs are hyperexpanded. Patient is rotated to the left. The cardio pericardial silhouette is enlarged. Interstitial markings are diffusely coarsened with chronic features. As before, there is hazy opacity in the lung bases, potentially dependent edema or layering pleural effusion. Left lung base largely obscured by the heart on this rotated film. Bones are diffusely demineralized. Telemetry leads overlie the chest. IMPRESSION: 1. Features compatible with emphysema. 2. Hazy opacity in the lung bases may reflect dependent edema or layering pleural effusion. Study limited by patient rotation. Electronically Signed   By: Kennith Center M.D.   On: 12/07/2021 06:08    Scheduled Meds:  amLODipine  10 mg Oral Daily   atorvastatin  20 mg Oral QHS   clonazePAM  0.5 mg Oral BID   dextromethorphan-guaiFENesin  1 tablet Oral BID   diclofenac  75 mg Oral BID   enoxaparin (LOVENOX) injection  40 mg Subcutaneous Q24H   furosemide  20 mg Oral Daily   gabapentin  600 mg Oral TID   hydrOXYzine  25 mg Oral TID   levalbuterol  0.63 mg Nebulization TID   levothyroxine  137 mcg Oral QAC breakfast   methylPREDNISolone (SOLU-MEDROL) injection  60 mg Intravenous BID   mirtazapine  15 mg Oral QHS   multivitamin with minerals  1 tablet Oral Daily   nicotine  21 mg Transdermal Daily   paliperidone  3 mg Oral q morning    pantoprazole  40 mg Oral Daily   potassium chloride SA  20 mEq Oral BID   rOPINIRole  2 mg Oral QHS   sertraline  100 mg Oral Daily   Continuous Infusions:   LOS: 0 days    Time spent: 45 mins    Tifanie Gardiner, MD [facility]   If 7PM-7AM, please contact night-coverage

## 2021-12-09 DIAGNOSIS — I10 Essential (primary) hypertension: Secondary | ICD-10-CM | POA: Diagnosis present

## 2021-12-09 DIAGNOSIS — Z9981 Dependence on supplemental oxygen: Secondary | ICD-10-CM | POA: Diagnosis not present

## 2021-12-09 DIAGNOSIS — Z9151 Personal history of suicidal behavior: Secondary | ICD-10-CM | POA: Diagnosis not present

## 2021-12-09 DIAGNOSIS — E7800 Pure hypercholesterolemia, unspecified: Secondary | ICD-10-CM | POA: Diagnosis present

## 2021-12-09 DIAGNOSIS — Z853 Personal history of malignant neoplasm of breast: Secondary | ICD-10-CM | POA: Diagnosis not present

## 2021-12-09 DIAGNOSIS — D649 Anemia, unspecified: Secondary | ICD-10-CM | POA: Diagnosis present

## 2021-12-09 DIAGNOSIS — Z9010 Acquired absence of unspecified breast and nipple: Secondary | ICD-10-CM | POA: Diagnosis not present

## 2021-12-09 DIAGNOSIS — Z9851 Tubal ligation status: Secondary | ICD-10-CM | POA: Diagnosis not present

## 2021-12-09 DIAGNOSIS — J439 Emphysema, unspecified: Secondary | ICD-10-CM | POA: Diagnosis present

## 2021-12-09 DIAGNOSIS — Z20822 Contact with and (suspected) exposure to covid-19: Secondary | ICD-10-CM | POA: Diagnosis present

## 2021-12-09 DIAGNOSIS — Z79890 Hormone replacement therapy (postmenopausal): Secondary | ICD-10-CM | POA: Diagnosis not present

## 2021-12-09 DIAGNOSIS — Z79899 Other long term (current) drug therapy: Secondary | ICD-10-CM | POA: Diagnosis not present

## 2021-12-09 DIAGNOSIS — J9621 Acute and chronic respiratory failure with hypoxia: Secondary | ICD-10-CM | POA: Diagnosis present

## 2021-12-09 DIAGNOSIS — J9622 Acute and chronic respiratory failure with hypercapnia: Secondary | ICD-10-CM | POA: Diagnosis present

## 2021-12-09 DIAGNOSIS — F251 Schizoaffective disorder, depressive type: Secondary | ICD-10-CM | POA: Diagnosis present

## 2021-12-09 DIAGNOSIS — Z888 Allergy status to other drugs, medicaments and biological substances status: Secondary | ICD-10-CM | POA: Diagnosis not present

## 2021-12-09 DIAGNOSIS — J441 Chronic obstructive pulmonary disease with (acute) exacerbation: Secondary | ICD-10-CM | POA: Diagnosis present

## 2021-12-09 DIAGNOSIS — Z8249 Family history of ischemic heart disease and other diseases of the circulatory system: Secondary | ICD-10-CM | POA: Diagnosis not present

## 2021-12-09 DIAGNOSIS — E039 Hypothyroidism, unspecified: Secondary | ICD-10-CM | POA: Diagnosis present

## 2021-12-09 DIAGNOSIS — K219 Gastro-esophageal reflux disease without esophagitis: Secondary | ICD-10-CM | POA: Diagnosis present

## 2021-12-09 DIAGNOSIS — Z9049 Acquired absence of other specified parts of digestive tract: Secondary | ICD-10-CM | POA: Diagnosis not present

## 2021-12-09 DIAGNOSIS — F17290 Nicotine dependence, other tobacco product, uncomplicated: Secondary | ICD-10-CM | POA: Diagnosis present

## 2021-12-09 LAB — CBC
HCT: 37 % (ref 36.0–46.0)
Hemoglobin: 12.5 g/dL (ref 12.0–15.0)
MCH: 31 pg (ref 26.0–34.0)
MCHC: 33.8 g/dL (ref 30.0–36.0)
MCV: 91.8 fL (ref 80.0–100.0)
Platelets: 211 10*3/uL (ref 150–400)
RBC: 4.03 MIL/uL (ref 3.87–5.11)
RDW: 13.5 % (ref 11.5–15.5)
WBC: 11.2 10*3/uL — ABNORMAL HIGH (ref 4.0–10.5)
nRBC: 0 % (ref 0.0–0.2)

## 2021-12-09 MED ORDER — LEVALBUTEROL HCL 1.25 MG/0.5ML IN NEBU
0.6300 mg | INHALATION_SOLUTION | Freq: Two times a day (BID) | RESPIRATORY_TRACT | Status: DC
  Administered 2021-12-09 – 2021-12-10 (×2): 0.63 mg via RESPIRATORY_TRACT
  Filled 2021-12-09 (×2): qty 0.5

## 2021-12-09 MED ORDER — CLONAZEPAM 0.5 MG PO TABS
0.5000 mg | ORAL_TABLET | Freq: Two times a day (BID) | ORAL | Status: DC
  Administered 2021-12-09 (×2): 0.5 mg via ORAL
  Filled 2021-12-09: qty 1

## 2021-12-09 NOTE — Progress Notes (Signed)
PROGRESS NOTE    Audrey Meyer  VWU:[id number] DOB: March 01, 1956 DOA: 12/07/2021  PCP: Talitha Givens, NP   Brief Narrative:  This 66 years old female with PMH significant for GERD, breast cancer, COPD, chronic respiratory failure on home oxygen at 4 L/min, active smoker, hyperlipidemia, hypertension, hyponatremia, hypothyroidism, schizoaffective disorder, history of suicidal attempt who was admitted on 11/25/2021 for COPD exacerbation,  discharged home on next day 11/26/2021.  She did not want to spend Christmas eve in the hospital so she left for home.  Patient reports having worsening shortness of breath associated with wheezing. Patient was getting short of breath just walking between rooms at home. Patient is admitted for COPD exacerbation,  started on IV Solu-Medrol and nebulized bronchodilators.  Assessment & Plan:   Principal Problem:   COPD with acute exacerbation (HCC) Active Problems:   Hypothyroidism   Schizoaffective disorder, depressive type (HCC)   Essential hypertension   Tobacco abuse   GERD (gastroesophageal reflux disease)   Acute on chronic respiratory failure with hypercapnia (HCC)   Hypocalcemia   Normocytic anemia  Acute on chronic hypoxic and hypercapnic respiratory failure secondary to COPD exacerbation: Patient presented with worsening shortness of breath with wheezing. Chest x-ray showed findings consistent with emphysema. Continue supplemental oxygen to keep saturation above 90%. Continue scheduled and as needed bronchodilators. Continue IV Solu-Medrol 60 mg every 12 hr. Start tapering with prednisone from tomorrow. Tobacco cessation advised  Hypothyroidism: Continue levothyroxine.  Essential hypertension: Continue amlodipine, furosemide.  GERD: Continue pantoprazole  Schizoaffective disorder: Continue hydroxyzine, Remeron, Zoloft, clonazepam, paliperidone Outpatient follow-up with psychiatry as scheduled.  Tobacco abuse: Continue nicotine  patch   DVT prophylaxis: Lovenox Code Status: Full code Family Communication: No family at bedside Disposition Plan:   Status is: Inpatient  Remains inpatient appropriate because:   Patient admitted for COPD exacerbation requiring IV Solu-Medrol and nebulized bronchodilators.    Consultants:  None  Procedures: None Antimicrobials:   Anti-infectives (From admission, onward)    Start     Dose/Rate Route Frequency Ordered Stop   12/07/21 1045  doxycycline (VIBRAMYCIN) capsule 100 mg  Status:  Discontinued        100 mg Oral 2 times daily 12/07/21 1035 12/07/21 1044       Subjective: Patient was seen and examined at bedside.  Overnight events noted.   She was very short of breath overnight requiring nebulization.  She still has significant wheezing on exam. She denies any chest pain but reports feeling short of breath.  Objective: Vitals:   12/09/21 0248 12/09/21 0348 12/09/21 0732 12/09/21 0852  BP: (!) 183/70 140/78  (!) 158/76  Pulse: 78 88  81  Resp: 20 18  (!) 22  Temp: 98.1 F (36.7 C) 97.9 F (36.6 C)  97.8 F (36.6 C)  TempSrc: Oral Oral    SpO2: 94% 98% 99% 94%  Weight:      Height:        Intake/Output Summary (Last 24 hours) at 12/09/2021 1204 Last data filed at 12/09/2021 1000 Gross per 24 hour  Intake 960 ml  Output 1450 ml  Net -490 ml   Filed Weights   12/08/21 1441  Weight: 78.1 kg    Examination:  General exam: Appears comfortable, not in any acute distress, able to complete sentences. Respiratory system: Wheezing noted bilaterally, no crackles, respiratory effort normal.  RR 17 Cardiovascular system: S1-S2 heard, regular rate and rhythm, no murmur. Gastrointestinal system: Abdomen is soft, nontender, nondistended, BS+ Central nervous system: Alert  and oriented x 3. No focal neurological deficits. Extremities: No edema, no cyanosis, no clubbing. Skin: No rashes, lesions or ulcers Psychiatry: Judgement and insight appear normal. Mood &  affect appropriate.     Data Reviewed: I have personally reviewed following labs and imaging studies  CBC: Recent Labs  Lab 12/07/21 0446 12/08/21 0222 12/09/21 0534  WBC 10.4 12.6* 11.2*  NEUTROABS 7.1  --   --   HGB 11.7* 11.2* 12.5  HCT 37.6 34.6* 37.0  MCV 98.9 95.3 91.8  PLT 183 190 211   Basic Metabolic Panel: Recent Labs  Lab 12/07/21 0446 12/08/21 0222  NA 142 138  K 3.7 3.9  CL 97* 95*  CO2 40* 35*  GLUCOSE 123* 173*  BUN 14 17  CREATININE 0.87 0.84  CALCIUM 8.5* 9.7   GFR: Estimated Creatinine Clearance: 62.9 mL/min (by C-G formula based on SCr of 0.84 mg/dL). Liver Function Tests: Recent Labs  Lab 12/07/21 0446  AST 19  ALT 26  ALKPHOS 61  BILITOT 0.5  PROT 7.1  ALBUMIN 4.0   No results for input(s): LIPASE, AMYLASE in the last 168 hours. No results for input(s): AMMONIA in the last 168 hours. Coagulation Profile: No results for input(s): INR, PROTIME in the last 168 hours. Cardiac Enzymes: No results for input(s): CKTOTAL, CKMB, CKMBINDEX, TROPONINI in the last 168 hours. BNP (last 3 results) No results for input(s): PROBNP in the last 8760 hours. HbA1C: No results for input(s): HGBA1C in the last 72 hours. CBG: No results for input(s): GLUCAP in the last 168 hours. Lipid Profile: No results for input(s): CHOL, HDL, LDLCALC, TRIG, CHOLHDL, LDLDIRECT in the last 72 hours. Thyroid Function Tests: No results for input(s): TSH, T4TOTAL, FREET4, T3FREE, THYROIDAB in the last 72 hours. Anemia Panel: No results for input(s): VITAMINB12, FOLATE, FERRITIN, TIBC, IRON, RETICCTPCT in the last 72 hours. Sepsis Labs: No results for input(s): PROCALCITON, LATICACIDVEN in the last 168 hours.  Recent Results (from the past 240 hour(s))  Resp Panel by RT-PCR (Flu A&B, Covid) Nasopharyngeal Swab     Status: None   Collection Time: 12/07/21  5:00 AM   Specimen: Nasopharyngeal Swab; Nasopharyngeal(NP) swabs in vial transport medium  Result Value Ref  Range Status   SARS Coronavirus 2 by RT PCR NEGATIVE NEGATIVE Final    Comment: (NOTE) SARS-CoV-2 target nucleic acids are NOT DETECTED.  The SARS-CoV-2 RNA is generally detectable in upper respiratory specimens during the acute phase of infection. The lowest concentration of SARS-CoV-2 viral copies this assay can detect is 138 copies/mL. A negative result does not preclude SARS-Cov-2 infection and should not be used as the sole basis for treatment or other patient management decisions. A negative result may occur with  improper specimen collection/handling, submission of specimen other than nasopharyngeal swab, presence of viral mutation(s) within the areas targeted by this assay, and inadequate number of viral copies(<138 copies/mL). A negative result must be combined with clinical observations, patient history, and epidemiological information. The expected result is Negative.  Fact Sheet for Patients:  BloggerCourse.com  Fact Sheet for Healthcare Providers:  SeriousBroker.it  This test is no t yet approved or cleared by the [place] FDA and  has been authorized for detection and/or diagnosis of SARS-CoV-2 by FDA under an Emergency Use Authorization (EUA). This EUA will remain  in effect (meaning this test can be used) for the duration of the COVID-19 declaration under Section 564(b)(1) of the Act, 21 U.S.C.section 360bbb-3(b)(1), unless the authorization is terminated  or  revoked sooner.       Influenza A by PCR NEGATIVE NEGATIVE Final   Influenza B by PCR NEGATIVE NEGATIVE Final    Comment: (NOTE) The Xpert Xpress SARS-CoV-2/FLU/RSV plus assay is intended as an aid in the diagnosis of influenza from Nasopharyngeal swab specimens and should not be used as a sole basis for treatment. Nasal washings and aspirates are unacceptable for Xpert Xpress SARS-CoV-2/FLU/RSV testing.  Fact Sheet for  Patients: BloggerCourse.com  Fact Sheet for Healthcare Providers: SeriousBroker.it  This test is not yet approved or cleared by the [place] FDA and has been authorized for detection and/or diagnosis of SARS-CoV-2 by FDA under an Emergency Use Authorization (EUA). This EUA will remain in effect (meaning this test can be used) for the duration of the COVID-19 declaration under Section 564(b)(1) of the Act, 21 U.S.C. section 360bbb-3(b)(1), unless the authorization is terminated or revoked.  Performed at [facility], [address], [place], [place] [address]     Radiology Studies: No results found.  Scheduled Meds:  amLODipine  10 mg Oral Daily   atorvastatin  20 mg Oral QHS   clonazePAM  0.5 mg Oral BID   dextromethorphan-guaiFENesin  1 tablet Oral BID   diclofenac  75 mg Oral BID   enoxaparin (LOVENOX) injection  40 mg Subcutaneous Q24H   furosemide  20 mg Oral Daily   gabapentin  600 mg Oral TID   hydrOXYzine  25 mg Oral TID   levalbuterol  0.63 mg Nebulization BID   levothyroxine  137 mcg Oral QAC breakfast   methylPREDNISolone (SOLU-MEDROL) injection  60 mg Intravenous BID   mirtazapine  15 mg Oral QHS   multivitamin with minerals  1 tablet Oral Daily   nicotine  21 mg Transdermal Daily   paliperidone  3 mg Oral q morning   pantoprazole  40 mg Oral Daily   potassium chloride SA  20 mEq Oral BID   rOPINIRole  2 mg Oral QHS   sertraline  100 mg Oral Daily   Continuous Infusions:   LOS: 0 days    Time spent: 35 mins    Waller Marcussen, MD [facility]   If 7PM-7AM, please contact night-coverage

## 2021-12-09 NOTE — TOC Initial Note (Signed)
Transition of Care [facility]) - Initial/Assessment Note    Patient Details  Name: Audrey Meyer MRN: [id number] Date of Birth: [birth year]  [facility] CM/SW Contact:    Dessa Phi, RN Phone Number: 12/09/2021, 2:51 PM  Clinical Narrative:  Patient d/c plan-home w/family  in [place],then return back to [place] at a later date. Has [employer] for home 02-has travel tank. Family to transport home on own.                Expected Discharge Plan: Home/Self Care Barriers to Discharge: Continued Medical Work up   Patient Goals and CMS Choice Patient states their goals for this hospitalization and ongoing recovery are:: go home CMS Medicare.gov Compare Post Acute Care list provided to:: Patient Choice offered to / list presented to : Patient  Expected Discharge Plan and Services Expected Discharge Plan: Home/Self Care   Discharge Planning Services: CM Consult   Living arrangements for the past 2 months: Single Family Home                                      Prior Living Arrangements/Services Living arrangements for the past 2 months: Single Family Home Lives with:: Relatives Patient language and need for interpreter reviewed:: Yes Do you feel safe going back to the place where you live?: Yes      Need for Family Participation in Patient Care: No (Comment) Care giver support system in place?: Yes (comment) Current home services: DME ([facility] 02) Criminal Activity/Legal Involvement Pertinent to Current Situation/Hospitalization: No - Comment as needed  Activities of Daily Living Home Assistive Devices/Equipment: Oxygen, Eyeglasses, Nebulizer ADL Screening (condition at time of admission) Patient's cognitive ability adequate to safely complete daily activities?: Yes Is the patient deaf or have difficulty hearing?: No Does the patient have difficulty seeing, even when wearing glasses/contacts?: No Does the patient have difficulty concentrating, remembering, or  making decisions?: No Patient able to express need for assistance with ADLs?: Yes Does the patient have difficulty dressing or bathing?: No Independently performs ADLs?: Yes (appropriate for developmental age) Does the patient have difficulty walking or climbing stairs?: Yes Weakness of Legs: None Weakness of Arms/Hands: None  Permission Sought/Granted Permission sought to share information with : Case Manager Permission granted to share information with : Yes, Verbal Permission Granted  Share Information with NAME: Case Manager           Emotional Assessment Appearance:: Appears stated age Attitude/Demeanor/Rapport: Gracious Affect (typically observed): Accepting Orientation: : Oriented to Self, Oriented to Place, Oriented to  Time, Oriented to Situation Alcohol / Substance Use: Not Applicable Psych Involvement: No (comment)  Admission diagnosis:  COPD with acute exacerbation (HCC) [J44.1] Chronic obstructive pulmonary disease, unspecified COPD type ([place]) [J44.9] Patient Active Problem List   Diagnosis Date Noted   Acute on chronic respiratory failure with hypercapnia ([place]) 12/07/2021   Hypocalcemia 12/07/2021   Normocytic anemia 12/07/2021   GERD (gastroesophageal reflux disease) 11/25/2021   On supplemental oxygen therapy 11/25/2021   Nicotine dependence 11/25/2021   COPD with acute exacerbation ([place]) 11/25/2021   Chronic respiratory failure with hypoxia, on home O2 therapy ([place]) - 4 L/min 11/25/2021   COPD exacerbation ([place]) 11/25/2021   Chronic respiratory failure with hypercapnia ([place]) 11/25/2021   Obesity (BMI 30-39.9) 11/25/2021   Major depressive disorder, recurrent episode, moderate ([place]) 04/28/2016   Schizophrenia, 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

## 2021-12-10 MED ORDER — PREDNISONE 20 MG PO TABS
40.0000 mg | ORAL_TABLET | Freq: Every day | ORAL | 0 refills | Status: AC
Start: 2021-12-10 — End: 2021-12-13

## 2021-12-10 NOTE — Discharge Summary (Signed)
Physician Discharge Summary  Audrey Meyer HYW:[id number] DOB: 1955/12/12 DOA: 12/07/2021  PCP: Ronald Pippins, NP  Admit date: 12/07/2021  Discharge date: 12/10/2021  Admitted From: Home.  Disposition:  Home.  Recommendations for Outpatient Follow-up:  Follow up with PCP in 1-2 weeks. Please obtain BMP/CBC in one week Advised to take prednisone 40 mg for 3 more days to complete 5-day treatment for COPD exacerbation. Patient is being discharged on baseline oxygen requirement.  Home Health: None Equipment/Devices: Home oxygen  Discharge Condition: Stable CODE STATUS:Full code Diet recommendation: Heart Healthy   Brief [facility] Course: This 66 years old female with PMH significant for GERD, breast cancer, COPD, chronic respiratory failure on home oxygen at 4 L/min, active smoker, hyperlipidemia, hypertension, hyponatremia, hypothyroidism, schizoaffective disorder, history of suicidal attempt who was admitted on 11/25/2021 for COPD exacerbation,  She was discharged home on next day 11/26/2021.  She did not want to spend Christmas eve in the hospital so she left for home.  Patient reports having worsening shortness of breath associated with wheezing after she went home. Patient was getting short of breath just walking between rooms at home. Patient was admitted for COPD exacerbation,  started on IV Solu-Medrol and nebulized bronchodilators.  She was managed closely in the hospital.  With continued steroids and bronchodilators she felt much improved , wheezing has resolved.  Patient has ambulated in the hallway without further shortness of breath.  She is back to her baseline oxygen requirement.  Patient wants to be discharged,  Patient is being discharged on prednisone for 3 more days.  She was managed for below problems.  Discharge Diagnoses:  Principal Problem:   COPD with acute exacerbation ([place]) Active Problems:   Hypothyroidism   Schizoaffective disorder, depressive type  ([place])   Essential hypertension   Tobacco abuse   GERD (gastroesophageal reflux disease)   Acute on chronic respiratory failure with hypercapnia ([facility])   Hypocalcemia   Normocytic anemia  Acute on chronic hypoxic and hypercapnic respiratory failure secondary to COPD exacerbation: Patient presented with worsening shortness of breath with wheezing. Chest x-ray showed findings consistent with emphysema. Continue supplemental oxygen to keep saturation above 90%. Continue scheduled and as needed bronchodilators. Continue IV Solu-Medrol 60 mg every 12 hr. Tobacco cessation advised.  She felt better. Wheezing has resolved, she is back to her baseline oxygen requirement.   Hypothyroidism: Continue levothyroxine.   Essential hypertension: Continue amlodipine, furosemide.   GERD: Continue pantoprazole   Schizoaffective disorder: Continue hydroxyzine, Remeron, Zoloft, clonazepam, paliperidone Outpatient follow-up with psychiatry as scheduled.   Tobacco abuse: Continue nicotine patch   Discharge Instructions.  Discharge Instructions     Call MD for:  difficulty breathing, headache or visual disturbances   Complete by: As directed    Call MD for:  persistant dizziness or light-headedness   Complete by: As directed    Call MD for:  persistant nausea and vomiting   Complete by: As directed    Diet - low sodium heart healthy   Complete by: As directed    Diet - low sodium heart healthy   Complete by: As directed    Diet Carb Modified   Complete by: As directed    Discharge instructions   Complete by: As directed    Advised to follow-up with primary care physician in 1 week. Advised to take prednisone 40 mg for 3 more days to complete 5-day treatment for COPD exacerbation Patient is being discharged on baseline oxygen requirement.   Increase activity slowly  Complete by: As directed    Increase activity slowly   Complete by: As directed       Allergies as of 12/10/2021        Reactions   Budesonide Shortness Of Breath   Promethazine Nausea And Vomiting   Lisinopril Other (See Comments)   "Dizzy spells"        Medication List     STOP taking these medications    doxycycline 100 MG capsule Commonly known as: VIBRAMYCIN       TAKE these medications    Advair HFA 115-21 MCG/ACT inhaler Generic drug: fluticasone-salmeterol Inhale 2 puffs into the lungs in the morning and at bedtime.   albuterol 108 (90 Base) MCG/ACT inhaler Commonly known as: VENTOLIN HFA Inhale 2 puffs into the lungs every 4 (four) hours as needed for wheezing or shortness of breath.   amLODipine 10 MG tablet Commonly known as: NORVASC Take 10 mg by mouth daily.   atorvastatin 20 MG tablet Commonly known as: LIPITOR Take 1 tablet (20 mg total) by mouth at bedtime.   clonazePAM 0.5 MG tablet Commonly known as: KLONOPIN Take 0.5 mg by mouth 2 (two) times daily.   cyclobenzaprine 10 MG tablet Commonly known as: FLEXERIL Take 10 mg by mouth 3 (three) times daily as needed for muscle spasms.   dextromethorphan-guaiFENesin 30-600 MG 12hr tablet Commonly known as: MUCINEX DM Take 1 tablet by mouth 2 (two) times daily.   diclofenac 75 MG EC tablet Commonly known as: VOLTAREN Take 75 mg by mouth in the morning and at bedtime.   furosemide 20 MG tablet Commonly known as: LASIX Take 20 mg by mouth daily.   gabapentin 600 MG tablet Commonly known as: NEURONTIN Take 600 mg by mouth 3 (three) times daily.   hydrOXYzine 25 MG capsule Commonly known as: VISTARIL Take 25 mg by mouth 3 (three) times daily.   levalbuterol 0.63 MG/3ML nebulizer solution Commonly known as: XOPENEX Take 3 mLs by nebulization 4 (four) times daily as needed for wheezing or shortness of breath.   levothyroxine 137 MCG tablet Commonly known as: SYNTHROID Take 137 mcg by mouth daily before breakfast.   mirtazapine 15 MG tablet Commonly known as: REMERON Take 15 mg by mouth at bedtime. What  changed: Another medication with the same name was removed. Continue taking this medication, and follow the directions you see here.   multivitamin with minerals Tabs tablet Take 1 tablet by mouth daily.   paliperidone 3 MG 24 hr tablet Commonly known as: INVEGA Take 3 mg by mouth every morning.   pantoprazole 40 MG tablet Commonly known as: PROTONIX Take 40 mg by mouth daily.   potassium chloride SA 20 MEQ tablet Commonly known as: KLOR-CON M Take 20 mEq by mouth 2 (two) times daily.   predniSONE 20 MG tablet Commonly known as: DELTASONE Take 2 tablets (40 mg total) by mouth daily for 3 days.   rOPINIRole 1 MG tablet Commonly known as: REQUIP Take 2 mg by mouth at bedtime.   sertraline 100 MG tablet Commonly known as: ZOLOFT Take 100 mg by mouth daily.        Follow-up Information     Ronald Pippins, NP Follow up in 1 week(s).   Specialty: Nurse Practitioner Contact information: [address] [place] [address] [phone]                Allergies  Allergen Reactions   Budesonide Shortness Of Breath   Promethazine Nausea And Vomiting  Lisinopril Other (See Comments)    "Dizzy spells"    Consultations: None   Procedures/Studies: DG Chest Port 1 View  Result Date: 12/07/2021 CLINICAL DATA:  Shortness of breath. EXAM: PORTABLE CHEST 1 VIEW COMPARISON:  11/25/2021 FINDINGS: 0531 hours. Lungs are hyperexpanded. Patient is rotated to the left. The cardio pericardial silhouette is enlarged. Interstitial markings are diffusely coarsened with chronic features. As before, there is hazy opacity in the lung bases, potentially dependent edema or layering pleural effusion. Left lung base largely obscured by the heart on this rotated film. Bones are diffusely demineralized. Telemetry leads overlie the chest. IMPRESSION: 1. Features compatible with emphysema. 2. Hazy opacity in the lung bases may reflect dependent edema or layering pleural effusion. Study limited  by patient rotation. Electronically Signed   By: Misty Stanley M.D.   On: 12/07/2021 06:08   DG Chest Port 1 View  Result Date: 11/25/2021 CLINICAL DATA:  Initial evaluation for acute shortness of breath. EXAM: PORTABLE CHEST 1 VIEW COMPARISON:  Prior radiograph from 11/16/2021. FINDINGS: Cardiomegaly, stable. Mediastinal silhouette within normal limits. Aortic atherosclerosis. Lungs mildly hypoinflated. Diffuse vascular and interstitial prominence consistent with pulmonary interstitial edema. Probable small bilateral pleural effusions. Superimposed hazy bibasilar opacities favored to reflect edema and/or atelectasis. No definite focal infiltrates. No pneumothorax. No acute osseous finding. IMPRESSION: 1. Cardiomegaly with diffuse pulmonary interstitial edema and probable small bilateral pleural effusions, suggesting CHF. 2. Superimposed hazy bibasilar opacities, favored to reflect edema and/or atelectasis. 3.  Aortic Atherosclerosis (ICD10-I70.0). Electronically Signed   By: Jeannine Boga M.D.   On: 11/25/2021 02:38   DG Chest Portable 1 View  Result Date: 11/16/2021 CLINICAL DATA:  Worsening shortness of breath. EXAM: PORTABLE CHEST 1 VIEW COMPARISON:  AP Lat chest 04/28/2016. FINDINGS: There is interval low inspiration. Interstitial haziness in the lung bases is seen and could be due to low lung volumes or early pneumonitis. The remaining lungs clear with COPD change. There are calcifications in the aortic arch. The mediastinum is normally outlined. The heart is slightly enlarged. No vascular congestion is seen. Mild osteopenia and thoracic spondylosis. IMPRESSION: Low inspiration exam with hazy atelectasis or infiltrate in the lung bases. PA and lateral study in full inspiration is recommended. Electronically Signed   By: Telford Nab M.D.   On: 11/16/2021 01:21     Subjective: Patient was seen and examined at bedside.  Overnight events noted.   Patient reports feeling much improved.   Wheezing has resolved.  She wants to be discharged.  Discharge Exam: Vitals:   12/09/21 2022 12/10/21 0534  BP: (!) 170/70 (!) 161/83  Pulse:  73  Resp:  18  Temp:  97.7 F (36.5 C)  SpO2:  100%   Vitals:   12/09/21 1928 12/09/21 2021 12/09/21 2022 12/10/21 0534  BP:   (!) 170/70 (!) 161/83  Pulse:  80  73  Resp:    18  Temp:  98.2 F (36.8 C)  97.7 F (36.5 C)  TempSrc:  Oral    SpO2: 98% 94%  100%  Weight:      Height:        General: Pt is alert, awake, not in acute distress Cardiovascular: RRR, S1/S2 +, no rubs, no gallops Respiratory: CTA bilaterally, no wheezing, no rhonchi Abdominal: Soft, NT, ND, bowel sounds + Extremities: no edema, no cyanosis    The results of significant diagnostics from this hospitalization (including imaging, microbiology, ancillary and laboratory) are listed below for reference.     Microbiology: Recent  Results (from the past 240 hour(s))  Resp Panel by RT-PCR (Flu A&B, Covid) Nasopharyngeal Swab     Status: None   Collection Time: 12/07/21  5:00 AM   Specimen: Nasopharyngeal Swab; Nasopharyngeal(NP) swabs in vial transport medium  Result Value Ref Range Status   SARS Coronavirus 2 by RT PCR NEGATIVE NEGATIVE Final    Comment: (NOTE) SARS-CoV-2 target nucleic acids are NOT DETECTED.  The SARS-CoV-2 RNA is generally detectable in upper respiratory specimens during the acute phase of infection. The lowest concentration of SARS-CoV-2 viral copies this assay can detect is 138 copies/mL. A negative result does not preclude SARS-Cov-2 infection and should not be used as the sole basis for treatment or other patient management decisions. A negative result may occur with  improper specimen collection/handling, submission of specimen other than nasopharyngeal swab, presence of viral mutation(s) within the areas targeted by this assay, and inadequate number of viral copies(<138 copies/mL). A negative result must be combined with clinical  observations, patient history, and epidemiological information. The expected result is Negative.  Fact Sheet for Patients:  EntrepreneurPulse.com.au  Fact Sheet for Healthcare Providers:  IncredibleEmployment.be  This test is no t yet approved or cleared by the [place] FDA and  has been authorized for detection and/or diagnosis of SARS-CoV-2 by FDA under an Emergency Use Authorization (EUA). This EUA will remain  in effect (meaning this test can be used) for the duration of the COVID-19 declaration under Section 564(b)(1) of the Act, 21 U.S.C.section 360bbb-3(b)(1), unless the authorization is terminated  or revoked sooner.       Influenza A by PCR NEGATIVE NEGATIVE Final   Influenza B by PCR NEGATIVE NEGATIVE Final    Comment: (NOTE) The Xpert Xpress SARS-CoV-2/FLU/RSV plus assay is intended as an aid in the diagnosis of influenza from Nasopharyngeal swab specimens and should not be used as a sole basis for treatment. Nasal washings and aspirates are unacceptable for Xpert Xpress SARS-CoV-2/FLU/RSV testing.  Fact Sheet for Patients: EntrepreneurPulse.com.au  Fact Sheet for Healthcare Providers: IncredibleEmployment.be  This test is not yet approved or cleared by the [place] FDA and has been authorized for detection and/or diagnosis of SARS-CoV-2 by FDA under an Emergency Use Authorization (EUA). This EUA will remain in effect (meaning this test can be used) for the duration of the COVID-19 declaration under Section 564(b)(1) of the Act, 21 U.S.C. section 360bbb-3(b)(1), unless the authorization is terminated or revoked.  Performed at [facility], [place] [address], [place], [place] [address]      Labs: BNP (last 3 results) Recent Labs    11/25/21 0136 12/07/21 0446  BNP 37.9 60.4   Basic Metabolic Panel: Recent Labs  Lab 12/07/21 0446 12/08/21 0222  NA 142  138  K 3.7 3.9  CL 97* 95*  CO2 40* 35*  GLUCOSE 123* 173*  BUN 14 17  CREATININE 0.87 0.84  CALCIUM 8.5* 9.7   Liver Function Tests: Recent Labs  Lab 12/07/21 0446  AST 19  ALT 26  ALKPHOS 61  BILITOT 0.5  PROT 7.1  ALBUMIN 4.0   No results for input(s): LIPASE, AMYLASE in the last 168 hours. No results for input(s): AMMONIA in the last 168 hours. CBC: Recent Labs  Lab 12/07/21 0446 12/08/21 0222 12/09/21 0534  WBC 10.4 12.6* 11.2*  NEUTROABS 7.1  --   --   HGB 11.7* 11.2* 12.5  HCT 37.6 34.6* 37.0  MCV 98.9 95.3 91.8  PLT 183 190 211   Cardiac  Enzymes: No results for input(s): CKTOTAL, CKMB, CKMBINDEX, TROPONINI in the last 168 hours. BNP: Invalid input(s): POCBNP CBG: No results for input(s): GLUCAP in the last 168 hours. D-Dimer No results for input(s): DDIMER in the last 72 hours. Hgb A1c No results for input(s): HGBA1C in the last 72 hours. Lipid Profile No results for input(s): CHOL, HDL, LDLCALC, TRIG, CHOLHDL, LDLDIRECT in the last 72 hours. Thyroid function studies No results for input(s): TSH, T4TOTAL, T3FREE, THYROIDAB in the last 72 hours.  Invalid input(s): FREET3 Anemia work up No results for input(s): VITAMINB12, FOLATE, FERRITIN, TIBC, IRON, RETICCTPCT in the last 72 hours. Urinalysis    Component Value Date/Time   COLORURINE STRAW (A) 11/16/2021 0405   APPEARANCEUR CLEAR 11/16/2021 0405   LABSPEC 1.014 11/16/2021 0405   PHURINE 6.0 11/16/2021 0405   GLUCOSEU NEGATIVE 11/16/2021 0405   HGBUR NEGATIVE 11/16/2021 0405   BILIRUBINUR NEGATIVE 11/16/2021 0405   KETONESUR NEGATIVE 11/16/2021 0405   PROTEINUR NEGATIVE 11/16/2021 0405   UROBILINOGEN 0.2 09/19/2012 2334   NITRITE NEGATIVE 11/16/2021 0405   LEUKOCYTESUR NEGATIVE 11/16/2021 0405   Sepsis Labs Invalid input(s): PROCALCITONIN,  WBC,  LACTICIDVEN Microbiology Recent Results (from the past 240 hour(s))  Resp Panel by RT-PCR (Flu A&B, Covid) Nasopharyngeal Swab     Status:  None   Collection Time: 12/07/21  5:00 AM   Specimen: Nasopharyngeal Swab; Nasopharyngeal(NP) swabs in vial transport medium  Result Value Ref Range Status   SARS Coronavirus 2 by RT PCR NEGATIVE NEGATIVE Final    Comment: (NOTE) SARS-CoV-2 target nucleic acids are NOT DETECTED.  The SARS-CoV-2 RNA is generally detectable in upper respiratory specimens during the acute phase of infection. The lowest concentration of SARS-CoV-2 viral copies this assay can detect is 138 copies/mL. A negative result does not preclude SARS-Cov-2 infection and should not be used as the sole basis for treatment or other patient management decisions. A negative result may occur with  improper specimen collection/handling, submission of specimen other than nasopharyngeal swab, presence of viral mutation(s) within the areas targeted by this assay, and inadequate number of viral copies(<138 copies/mL). A negative result must be combined with clinical observations, patient history, and epidemiological information. The expected result is Negative.  Fact Sheet for Patients:  EntrepreneurPulse.com.au  Fact Sheet for Healthcare Providers:  IncredibleEmployment.be  This test is no t yet approved or cleared by the [place] FDA and  has been authorized for detection and/or diagnosis of SARS-CoV-2 by FDA under an Emergency Use Authorization (EUA). This EUA will remain  in effect (meaning this test can be used) for the duration of the COVID-19 declaration under Section 564(b)(1) of the Act, 21 U.S.C.section 360bbb-3(b)(1), unless the authorization is terminated  or revoked sooner.       Influenza A by PCR NEGATIVE NEGATIVE Final   Influenza B by PCR NEGATIVE NEGATIVE Final    Comment: (NOTE) The Xpert Xpress SARS-CoV-2/FLU/RSV plus assay is intended as an aid in the diagnosis of influenza from Nasopharyngeal swab specimens and should not be used as a sole basis for  treatment. Nasal washings and aspirates are unacceptable for Xpert Xpress SARS-CoV-2/FLU/RSV testing.  Fact Sheet for Patients: EntrepreneurPulse.com.au  Fact Sheet for Healthcare Providers: IncredibleEmployment.be  This test is not yet approved or cleared by the [place] FDA and has been authorized for detection and/or diagnosis of SARS-CoV-2 by FDA under an Emergency Use Authorization (EUA). This EUA will remain in effect (meaning this test can be used) for the duration of the COVID-19  declaration under Section 564(b)(1) of the Act, 21 U.S.C. section 360bbb-3(b)(1), unless the authorization is terminated or revoked.  Performed at [facility], [place] [address] [place], [place] [address]      Time coordinating discharge: Over 30 minutes  SIGNED:   Shawna Clamp, MD  [facility] 12/10/2021, 3:17 PM Pager   If 7PM-7AM, please contact night-coverage

## 2021-12-10 NOTE — Plan of Care (Signed)
  Problem: Education: Goal: Knowledge of disease or condition will improve Outcome: Adequate for Discharge Goal: Knowledge of the prescribed therapeutic regimen will improve Outcome: Adequate for Discharge Goal: Individualized Educational Video(s) Outcome: Adequate for Discharge   Problem: Activity: Goal: Ability to tolerate increased activity will improve Outcome: Adequate for Discharge Goal: Will verbalize the importance of balancing activity with adequate rest periods Outcome: Adequate for Discharge   Problem: Respiratory: Goal: Ability to maintain a clear airway will improve Outcome: Adequate for Discharge Goal: Levels of oxygenation will improve Outcome: Adequate for Discharge Goal: Ability to maintain adequate ventilation will improve Outcome: Adequate for Discharge   

## 2021-12-10 NOTE — Discharge Instructions (Signed)
Advised to follow-up with primary care physician in 1 week. Advised to take prednisone 40 mg for 3 more days to complete 5-day treatment for COPD exacerbation Patient is being discharged on baseline oxygen requirement.

## 2021-12-10 NOTE — Progress Notes (Signed)
Patient vital signs stable. RN removed IV and reviewed discharge paperwork with patient who stated understanding.   Patient in possession of all personal belongings. NT transported patient to exit via wheelchair.

## 2022-05-03 ENCOUNTER — Other Ambulatory Visit: Payer: Self-pay | Admitting: Family

## 2022-05-03 DIAGNOSIS — Z1231 Encounter for screening mammogram for malignant neoplasm of breast: Secondary | ICD-10-CM

## 2022-05-06 ENCOUNTER — Other Ambulatory Visit: Payer: Self-pay | Admitting: Family

## 2022-05-06 DIAGNOSIS — N644 Mastodynia: Secondary | ICD-10-CM

## 2022-05-23 IMAGING — DX DG CHEST 1V PORT
1 series · 1 of 1 positions shown · non-contrast
Comparison: 11/25/2021

CLINICAL DATA: Shortness of breath.

EXAM:
PORTABLE CHEST 1 VIEW

[chest ap]
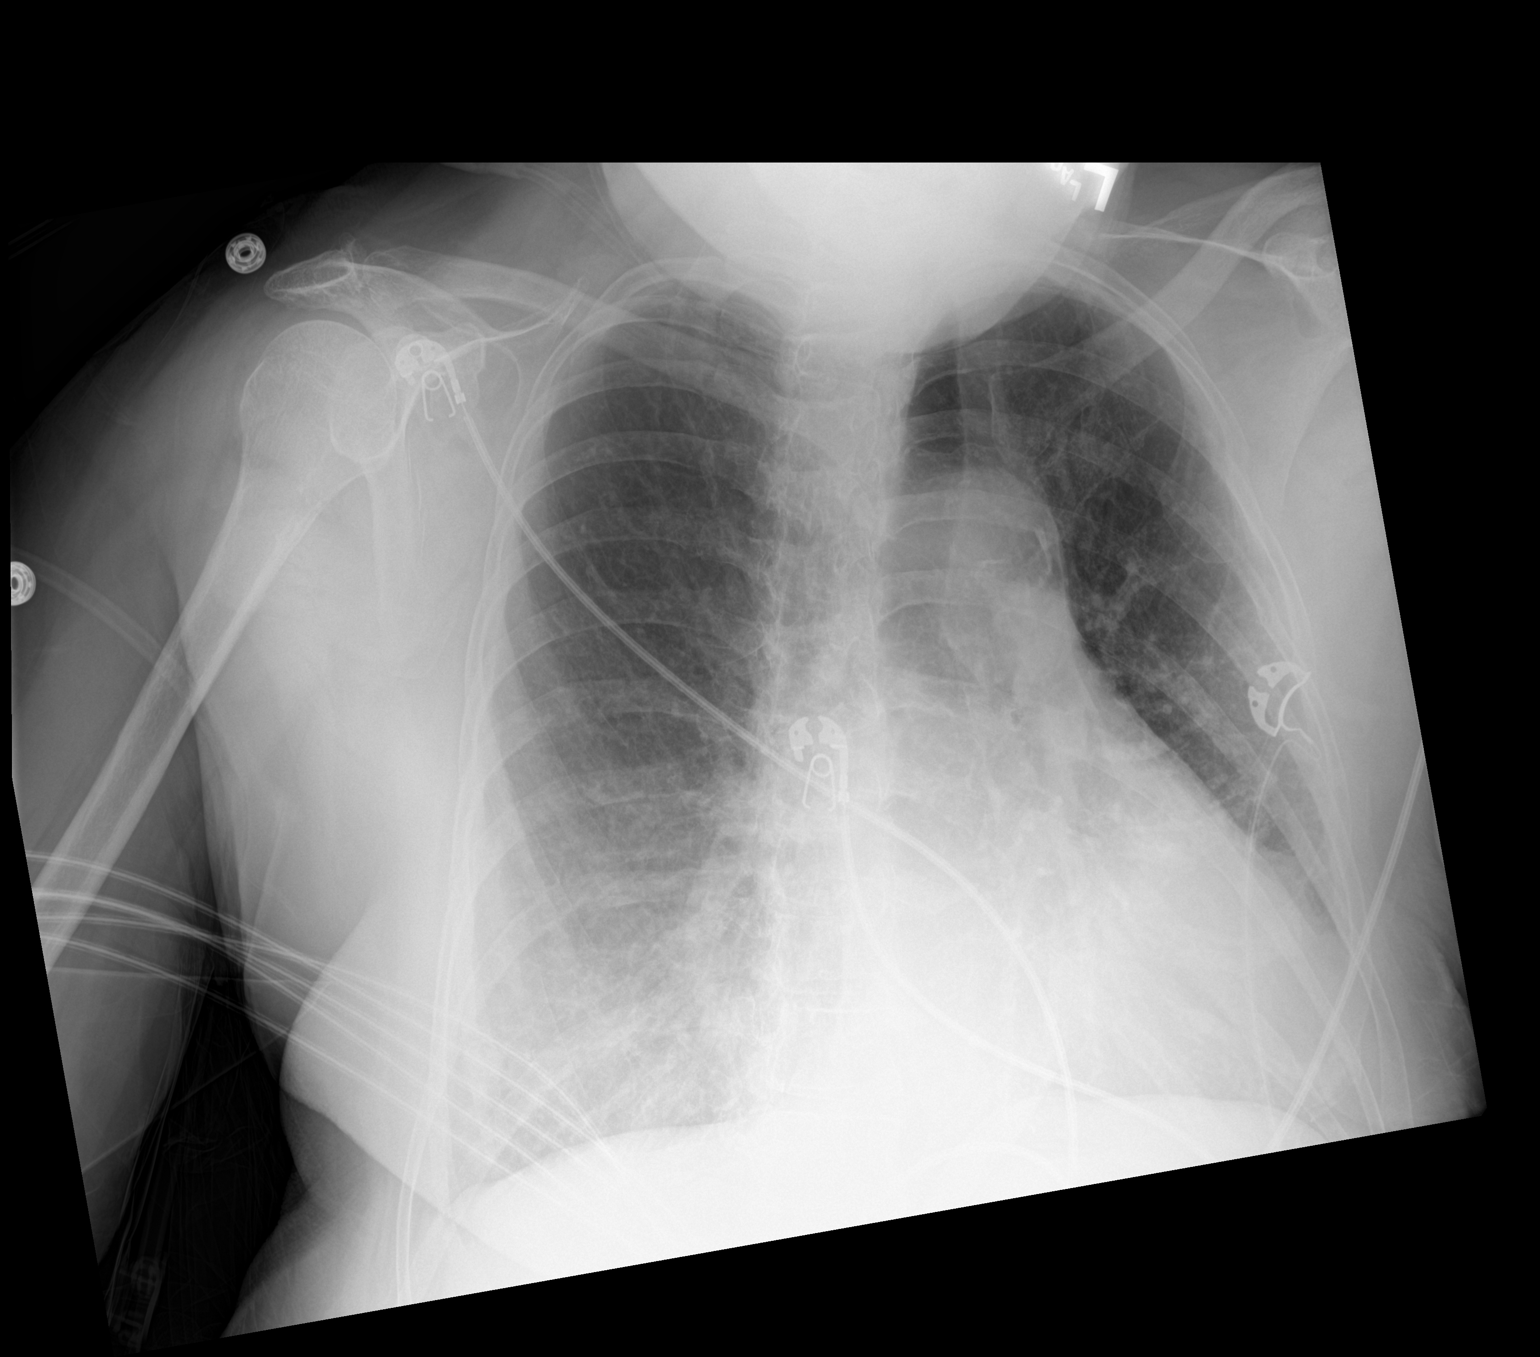

[1 of 1 positions shown; findings below may reference images not displayed]

FINDINGS: 6596 hours. Lungs are hyperexpanded. Patient is rotated to the left.
The cardio pericardial silhouette is enlarged. Interstitial markings
are diffusely coarsened with chronic features. As before, there is
hazy opacity in the lung bases, potentially dependent edema or
layering pleural effusion. Left lung base largely obscured by the
heart on this rotated film. Bones are diffusely demineralized.
Telemetry leads overlie the chest.
IMPRESSION: 1. Features compatible with emphysema.
2. Hazy opacity in the lung bases may reflect dependent edema or
layering pleural effusion. Study limited by patient rotation.

## 2022-06-03 ENCOUNTER — Other Ambulatory Visit: Payer: Self-pay | Admitting: Family

## 2022-06-03 DIAGNOSIS — N644 Mastodynia: Secondary | ICD-10-CM

## 2022-06-13 ENCOUNTER — Ambulatory Visit: Payer: Medicare PPO

## 2022-06-15 ENCOUNTER — Ambulatory Visit: Payer: Medicare PPO

## 2022-06-22 ENCOUNTER — Ambulatory Visit
Admission: RE | Admit: 2022-06-22 | Discharge: 2022-06-22 | Disposition: A | Discharge status: Home / Self Care | Payer: Medicare PPO | Source: Ambulatory Visit | Attending: Family | Admitting: Family

## 2022-06-22 ENCOUNTER — Ambulatory Visit: Payer: Medicare PPO

## 2022-06-22 DIAGNOSIS — Z1231 Encounter for screening mammogram for malignant neoplasm of breast: Secondary | ICD-10-CM

## 2022-06-22 HISTORY — DX: Personal history of irradiation: Z92.3

## 2022-07-15 ENCOUNTER — Encounter (HOSPITAL_COMMUNITY): Payer: Self-pay

## 2022-07-15 ENCOUNTER — Emergency Department (HOSPITAL_COMMUNITY)
Admission: EM | Admit: 2022-07-15 | Discharge: 2022-07-15 | Disposition: A | Discharge status: Home / Self Care | Payer: Medicare PPO | Attending: Emergency Medicine | Admitting: Emergency Medicine

## 2022-07-15 ENCOUNTER — Other Ambulatory Visit: Payer: Self-pay

## 2022-07-15 ENCOUNTER — Emergency Department (HOSPITAL_COMMUNITY): Payer: Medicare PPO

## 2022-07-15 DIAGNOSIS — Z7951 Long term (current) use of inhaled steroids: Secondary | ICD-10-CM | POA: Diagnosis not present

## 2022-07-15 DIAGNOSIS — H9201 Otalgia, right ear: Secondary | ICD-10-CM | POA: Diagnosis not present

## 2022-07-15 DIAGNOSIS — I10 Essential (primary) hypertension: Secondary | ICD-10-CM | POA: Insufficient documentation

## 2022-07-15 DIAGNOSIS — J441 Chronic obstructive pulmonary disease with (acute) exacerbation: Secondary | ICD-10-CM | POA: Diagnosis not present

## 2022-07-15 DIAGNOSIS — Z79899 Other long term (current) drug therapy: Secondary | ICD-10-CM | POA: Diagnosis not present

## 2022-07-15 DIAGNOSIS — R0902 Hypoxemia: Secondary | ICD-10-CM | POA: Diagnosis not present

## 2022-07-15 DIAGNOSIS — R519 Headache, unspecified: Secondary | ICD-10-CM | POA: Diagnosis present

## 2022-07-15 LAB — COMPREHENSIVE METABOLIC PANEL
ALT: 30 U/L (ref 0–44)
AST: 22 U/L (ref 15–41)
Albumin: 3.9 g/dL (ref 3.5–5.0)
Alkaline Phosphatase: 56 U/L (ref 38–126)
Anion gap: 10 (ref 5–15)
BUN: 19 mg/dL (ref 8–23)
CO2: 34 mmol/L — ABNORMAL HIGH (ref 22–32)
Calcium: 9.3 mg/dL (ref 8.9–10.3)
Chloride: 95 mmol/L — ABNORMAL LOW (ref 98–111)
Creatinine, Ser: 0.76 mg/dL (ref 0.44–1.00)
GFR, Estimated: >60 mL/min (ref >60)
Glucose, Bld: 129 mg/dL — ABNORMAL HIGH (ref 70–99)
Potassium: 3.6 mmol/L (ref 3.5–5.1)
Sodium: 139 mmol/L (ref 135–145)
Total Bilirubin: 0.5 mg/dL (ref 0.3–1.2)
Total Protein: 7.1 g/dL (ref 6.5–8.1)

## 2022-07-15 LAB — CBC
HCT: 36.1 % (ref 36.0–46.0)
Hemoglobin: 11.8 g/dL — ABNORMAL LOW (ref 12.0–15.0)
MCH: 30.9 pg (ref 26.0–34.0)
MCHC: 32.7 g/dL (ref 30.0–36.0)
MCV: 94.5 fL (ref 80.0–100.0)
Platelets: 278 10*3/uL (ref 150–400)
RBC: 3.82 MIL/uL — ABNORMAL LOW (ref 3.87–5.11)
RDW: 12.8 % (ref 11.5–15.5)
WBC: 12.7 10*3/uL — ABNORMAL HIGH (ref 4.0–10.5)
nRBC: 0 % (ref 0.0–0.2)

## 2022-07-15 LAB — TROPONIN I (HIGH SENSITIVITY)
Troponin I (High Sensitivity): 8 ng/L (ref <18)
Troponin I (High Sensitivity): 9 ng/L (ref <18)

## 2022-07-15 MED ORDER — DOXYCYCLINE HYCLATE 100 MG PO CAPS: 100.0000 mg | ORAL_CAPSULE | Freq: Two times a day (BID) | ORAL | 0 refills | Status: DC

## 2022-07-15 MED ORDER — METHYLPREDNISOLONE SODIUM SUCC 125 MG IJ SOLR
125.0000 mg | Freq: Once | INTRAMUSCULAR | Status: AC
  Administered 2022-07-15: 125 mg via INTRAVENOUS
  Filled 2022-07-15: qty 2

## 2022-07-15 MED ORDER — IPRATROPIUM-ALBUTEROL 0.5-2.5 (3) MG/3ML IN SOLN
3.0000 mL | Freq: Once | RESPIRATORY_TRACT | Status: AC
  Administered 2022-07-15: 3 mL via RESPIRATORY_TRACT
  Filled 2022-07-15: qty 3

## 2022-07-15 MED ORDER — PREDNISONE 20 MG PO TABS: ORAL_TABLET | ORAL | 0 refills | Status: DC

## 2022-07-15 NOTE — ED Triage Notes (Addendum)
Patient c/o bilateral ear pain, headache x 4-5 days. Patient went to an UC today and was told to come to the ED for O2 Sats being 88% on 4L/min. Patient has home O2 4L. Sats in triage 96% on 4L/min via [place]

## 2022-07-15 NOTE — ED Provider Triage Note (Signed)
Emergency Medicine Provider Triage Evaluation Note  Audrey Meyer , a 66 y.o. female  was evaluated in triage.  Pt complains of 1 week history of headache, bilateral ear pain as well as difficulty breathing.  Patient states that she "has a sinus infection."  She reports bilateral ear fullness without discharge.  Denies headache as frontal in between the eyes which is gradual in onset and similar to headache she had in the past associated with sinus infections.  Patient has a history of COPD is on 4 L of oxygen chronically.  She went to an urgent care earlier today for her headache and bilateral ear pain and they sent her to the emergency department with a reported oxygen saturation of 88%.  Patient reported today and was on 4 L with a 99% oxygen saturation.  She was bumped up to 3 L and remained 96-97 oxygen.  She reports increasing cough over the past 2 to 3 weeks.  She continues to smoke.  Denies fever, chills, night sweats, chest pain..  Review of Systems  Positive: See above Negative:   Physical Exam  BP (!) 149/72 (BP Location: Left Arm)   Pulse 77   Temp 98 F (36.7 C) (Oral)   Resp 18   Ht '5\' 2"'$  (1.575 m)   Wt 78 kg   SpO2 97%   BMI 31.46 kg/m  Gen:   Awake, no distress   Resp:  Normal effort  MSK:   Moves extremities without difficulty  Other:  Mild rales heard bilateral lungs with decreased lung sounds bilaterally..  No obvious murmurs gallops or rubs.  No lower extremity edema noted.  Medical Decision Making  Medically screening exam initiated at 6:30 PM.  Appropriate orders placed.  BRENISHA TSUI was informed that the remainder of the evaluation will be completed by another provider, this initial triage assessment does not replace that evaluation, and the importance of remaining in the ED until their evaluation is complete.    Wilnette Kales, [place] 07/15/22 [phone]

## 2022-07-15 NOTE — ED Provider Notes (Signed)
[facility] DEPT Provider Note   CSN: [id number] Arrival date & time: 07/15/22  1754     History  Chief Complaint  Patient presents with   low sats   Otalgia   Headache    Audrey Meyer is a 66 y.o. female.  Patient is a 66 year old female who presents with shortness of breath.  She has a history of COPD and schizoaffective disorder as well as hypertension and hyperlipidemia.  She is on chronic home oxygen at 4 L/min.  She presents with a 2-week history of some nasal congestion and stuffiness with ear pain.  During that time she is also felt like her COPD is starting to flareup.  She has had some increased wheezing and shortness of breath.  She has some pain in the center of her chest related to coughing.  No leg swelling.  No calf pain.  No known fevers.  She went to urgent care and was found to be a bit hypoxic with an oxygen saturation of 88% and was sent here for further evaluation.  She does have a cough but she has not been bringing anything up with a cough.       Home Medications Prior to Admission medications   Medication Sig Start Date End Date Taking? Authorizing Provider  doxycycline (VIBRAMYCIN) 100 MG capsule Take 1 capsule (100 mg total) by mouth 2 (two) times daily. One po bid x 7 days 07/15/22  Yes Malvin Johns, MD  predniSONE (DELTASONE) 20 MG tablet 2 tabs po daily x 4 days 07/15/22  Yes Malvin Johns, MD  albuterol (VENTOLIN HFA) 108 (90 Base) MCG/ACT inhaler Inhale 2 puffs into the lungs every 4 (four) hours as needed for wheezing or shortness of breath. 11/16/21   Orpah Greek, MD  amLODipine (NORVASC) 10 MG tablet Take 10 mg by mouth daily. 04/17/17   [provider]  atorvastatin (LIPITOR) 20 MG tablet Take 1 tablet (20 mg total) by mouth at bedtime. 05/05/16   Withrow, Elyse Jarvis, FNP  clonazePAM (KLONOPIN) 0.5 MG tablet Take 0.5 mg by mouth 2 (two) times daily. 11/20/21   [provider]  cyclobenzaprine  (FLEXERIL) 10 MG tablet Take 10 mg by mouth 3 (three) times daily as needed for muscle spasms. 07/21/21   [provider]  dextromethorphan-guaiFENesin (MUCINEX DM) 30-600 MG 12hr tablet Take 1 tablet by mouth 2 (two) times daily.    [provider]  diclofenac (VOLTAREN) 75 MG EC tablet Take 75 mg by mouth in the morning and at bedtime. 10/15/21   [provider]  fluticasone-salmeterol (ADVAIR HFA) 115-21 MCG/ACT inhaler Inhale 2 puffs into the lungs in the morning and at bedtime. 09/28/21   [provider]  furosemide (LASIX) 20 MG tablet Take 20 mg by mouth daily. 05/01/17   [provider]  gabapentin (NEURONTIN) 600 MG tablet Take 600 mg by mouth 3 (three) times daily.    [provider]  hydrOXYzine (VISTARIL) 25 MG capsule Take 25 mg by mouth 3 (three) times daily. 05/05/17   [provider]  levalbuterol Penne Lash) 0.63 MG/3ML nebulizer solution Take 3 mLs by nebulization 4 (four) times daily as needed for wheezing or shortness of breath. 11/13/21   [provider]  levothyroxine (SYNTHROID) 137 MCG tablet Take 137 mcg by mouth daily before breakfast.    [provider]  mirtazapine (REMERON) 15 MG tablet Take 15 mg by mouth at bedtime. 05/05/17   [provider]  Multiple Vitamin (  MULTIVITAMIN WITH MINERALS) TABS tablet Take 1 tablet by mouth daily.    [provider]  paliperidone (INVEGA) 3 MG 24 hr tablet Take 3 mg by mouth every morning. 10/29/21   [provider]  pantoprazole (PROTONIX) 40 MG tablet Take 40 mg by mouth daily. 09/28/21   [provider]  potassium chloride SA (K-DUR,KLOR-CON) 20 MEQ tablet Take 20 mEq by mouth 2 (two) times daily.     [provider]  rOPINIRole (REQUIP) 1 MG tablet Take 2 mg by mouth at bedtime.    [provider]  sertraline (ZOLOFT) 100 MG tablet Take 100 mg by mouth daily. 05/05/17   [provider]       Allergies    Budesonide, Promethazine, and Lisinopril    Review of Systems   Review of Systems  Constitutional:  Positive for fatigue. Negative for chills, diaphoresis and fever.  HENT:  Positive for congestion and ear pain. Negative for rhinorrhea and sneezing.   Eyes: Negative.   Respiratory:  Positive for cough, shortness of breath and wheezing. Negative for chest tightness.   Cardiovascular:  Negative for chest pain and leg swelling.  Gastrointestinal:  Negative for abdominal pain, blood in stool, diarrhea, nausea and vomiting.  Genitourinary:  Negative for difficulty urinating, flank pain, frequency and hematuria.  Musculoskeletal:  Negative for arthralgias and back pain.  Skin:  Negative for rash.  Neurological:  Negative for dizziness, speech difficulty, weakness, numbness and headaches.    Physical Exam Updated Vital Signs BP (!) 173/84   Pulse 64   Temp 97.9 F (36.6 C) (Oral)   Resp 16   Ht '5\' 2"'$  (1.575 m)   Wt 78 kg   SpO2 93%   BMI 31.46 kg/m  Physical Exam Constitutional:      Appearance: She is well-developed.  HENT:     Head: Normocephalic and atraumatic.     Right Ear: Tympanic membrane normal.     Left Ear: Tympanic membrane normal.  Eyes:     Pupils: Pupils are equal, round, and reactive to light.  Cardiovascular:     Rate and Rhythm: Normal rate and regular rhythm.     Heart sounds: Normal heart sounds.  Pulmonary:     Effort: Pulmonary effort is normal. No respiratory distress.     Breath sounds: Wheezing present. No rales.  Chest:     Chest wall: No tenderness.  Abdominal:     General: Bowel sounds are normal.     Palpations: Abdomen is soft.     Tenderness: There is no abdominal tenderness. There is no guarding or rebound.  Musculoskeletal:        General: Normal range of motion.     Cervical back: Normal range of motion and neck supple.     Comments: No edema or calf tenderness  Lymphadenopathy:     Cervical: No cervical adenopathy.   Skin:    General: Skin is warm and dry.     Findings: No rash.  Neurological:     Mental Status: She is alert and oriented to person, place, and time.     ED Results / Procedures / Treatments   Labs (all labs ordered are listed, but only abnormal results are displayed) Labs Reviewed  CBC - Abnormal; Notable for the following components:      Result Value   WBC 12.7 (*)    RBC 3.82 (*)    Hemoglobin 11.8 (*)    All other components within normal limits  COMPREHENSIVE METABOLIC PANEL - Abnormal; Notable for the following components:   Chloride 95 (*)    CO2 34 (*)    Glucose, Bld 129 (*)    All other components within normal limits  TROPONIN I (HIGH SENSITIVITY)  TROPONIN I (HIGH SENSITIVITY)    EKG EKG Interpretation  Date/Time:  Friday July 15 2022 18:12:41 EDT Ventricular Rate:  75 PR Interval:  139 QRS Duration: 101 QT Interval:  387 QTC Calculation: 433 R Axis:   97 Text Interpretation: Sinus rhythm Right axis deviation since last tracing no significant change Confirmed by Malvin Johns [phone]) on 07/15/2022 9:26:11 PM  Radiology DG Chest 2 View  Result Date: 07/15/2022 CLINICAL DATA:  Cough. EXAM: CHEST - 2 VIEW COMPARISON:  Chest x-ray 12/07/2021 FINDINGS: The heart size and mediastinal contours are within normal limits. Both lungs are clear. The visualized skeletal structures are unremarkable. IMPRESSION: No active cardiopulmonary disease. Electronically Signed   By: Ronney Asters M.D.   On: 07/15/2022 19:11    Procedures Procedures    Medications Ordered in ED Medications  methylPREDNISolone sodium succinate (SOLU-MEDROL) 125 mg/2 mL injection 125 mg (125 mg Intravenous Given 07/15/22 2217)  ipratropium-albuterol (DUONEB) 0.5-2.5 (3) MG/3ML nebulizer solution 3 mL (3 mLs Nebulization Given 07/15/22 2217)    ED Course/ Medical Decision Making/ A&P                           Medical Decision Making Risk Prescription drug management.   Patient is a  66 year old female who presents with cough and wheezing consistent with prior COPD exacerbations.  Chest x-ray shows no evidence of pneumonia or other acute abnormality.  This was interpreted by me and confirmed by the radiologist.  Labs are nonconcerning.  EKG did not show any ischemic changes.  She has had 2 negative troponins.  No other symptoms that sound more concerning for ACS or PE.  She was given nebulizer treatment in the ED as well as Solu-Medrol.  She is feeling better after this.  She is maintaining oxygen saturations in the high 90s on her baseline oxygen.  On my last evaluation it was 98%.  She is talking in full sentences without any increased work of breathing.  Her wheezing has improved after treatment in the ED.  She was discharged home in good condition.  She says that she normally has some increase of her baseline prednisone when she has flareups.  Will increase her prednisone to 40 mg for 4 more days.  She was given a dose of Solu-Medrol for tonight.  Encouraged her to follow-up with her PCP.  Return precautions were given.  Final Clinical Impression(s) / ED Diagnoses Final diagnoses:  COPD exacerbation ([place])    Rx / DC Orders ED Discharge Orders          Ordered    doxycycline (VIBRAMYCIN) 100 MG capsule  2 times daily        07/15/22 2301    predniSONE (DELTASONE) 20 MG tablet        07/15/22 2301              Malvin Johns, MD 07/15/22 2305

## 2022-07-15 NOTE — ED Notes (Signed)
Breathing treatment completed and pts SPO2 is 93 on 4lm

## 2022-11-06 ENCOUNTER — Other Ambulatory Visit: Payer: Self-pay

## 2022-11-06 ENCOUNTER — Emergency Department (HOSPITAL_COMMUNITY): Payer: Medicare PPO

## 2022-11-06 ENCOUNTER — Emergency Department (HOSPITAL_COMMUNITY)
Admission: EM | Admit: 2022-11-06 | Discharge: 2022-11-06 | Disposition: A | Discharge status: Home / Self Care | Payer: Medicare PPO | Attending: Emergency Medicine | Admitting: Emergency Medicine

## 2022-11-06 ENCOUNTER — Encounter (HOSPITAL_COMMUNITY): Payer: Self-pay

## 2022-11-06 DIAGNOSIS — Z7951 Long term (current) use of inhaled steroids: Secondary | ICD-10-CM | POA: Insufficient documentation

## 2022-11-06 DIAGNOSIS — J441 Chronic obstructive pulmonary disease with (acute) exacerbation: Secondary | ICD-10-CM | POA: Diagnosis not present

## 2022-11-06 DIAGNOSIS — R0602 Shortness of breath: Secondary | ICD-10-CM | POA: Diagnosis present

## 2022-11-06 DIAGNOSIS — Z7952 Long term (current) use of systemic steroids: Secondary | ICD-10-CM | POA: Diagnosis not present

## 2022-11-06 LAB — CBC WITH DIFFERENTIAL/PLATELET
Abs Immature Granulocytes: 0.16 10*3/uL — ABNORMAL HIGH (ref 0.00–0.07)
Basophils Absolute: 0 10*3/uL (ref 0.0–0.1)
Basophils Relative: 0 %
Eosinophils Absolute: 0 10*3/uL (ref 0.0–0.5)
Eosinophils Relative: 0 %
HCT: 34.1 % — ABNORMAL LOW (ref 36.0–46.0)
Hemoglobin: 11 g/dL — ABNORMAL LOW (ref 12.0–15.0)
Immature Granulocytes: 2 %
Lymphocytes Relative: 3 %
Lymphs Abs: 0.4 10*3/uL — ABNORMAL LOW (ref 0.7–4.0)
MCH: 30.1 pg (ref 26.0–34.0)
MCHC: 32.3 g/dL (ref 30.0–36.0)
MCV: 93.4 fL (ref 80.0–100.0)
Monocytes Absolute: 0.3 10*3/uL (ref 0.1–1.0)
Monocytes Relative: 3 %
Neutro Abs: 9.9 10*3/uL — ABNORMAL HIGH (ref 1.7–7.7)
Neutrophils Relative %: 92 %
Platelets: 255 10*3/uL (ref 150–400)
RBC: 3.65 MIL/uL — ABNORMAL LOW (ref 3.87–5.11)
RDW: 12.5 % (ref 11.5–15.5)
WBC: 10.7 10*3/uL — ABNORMAL HIGH (ref 4.0–10.5)
nRBC: 0 % (ref 0.0–0.2)

## 2022-11-06 LAB — COMPREHENSIVE METABOLIC PANEL
ALT: 31 U/L (ref 0–44)
AST: 19 U/L (ref 15–41)
Albumin: 3.5 g/dL (ref 3.5–5.0)
Alkaline Phosphatase: 51 U/L (ref 38–126)
Anion gap: 7 (ref 5–15)
BUN: 24 mg/dL — ABNORMAL HIGH (ref 8–23)
CO2: 39 mmol/L — ABNORMAL HIGH (ref 22–32)
Calcium: 9.3 mg/dL (ref 8.9–10.3)
Chloride: 96 mmol/L — ABNORMAL LOW (ref 98–111)
Creatinine, Ser: 0.93 mg/dL (ref 0.44–1.00)
GFR, Estimated: >60 mL/min (ref >60)
Glucose, Bld: 208 mg/dL — ABNORMAL HIGH (ref 70–99)
Potassium: 4.1 mmol/L (ref 3.5–5.1)
Sodium: 142 mmol/L (ref 135–145)
Total Bilirubin: 0.5 mg/dL (ref 0.3–1.2)
Total Protein: 6.4 g/dL — ABNORMAL LOW (ref 6.5–8.1)

## 2022-11-06 MED ORDER — PREDNISONE 20 MG PO TABS: ORAL_TABLET | ORAL | 0 refills | Status: DC

## 2022-11-06 MED ORDER — MAGNESIUM SULFATE 2 GM/50ML IV SOLN
2.0000 g | Freq: Once | INTRAVENOUS | Status: AC
  Administered 2022-11-06: 2 g via INTRAVENOUS
  Filled 2022-11-06: qty 50

## 2022-11-06 MED ORDER — MUPIROCIN CALCIUM 2 % EX CREA: 1.0000 | TOPICAL_CREAM | Freq: Two times a day (BID) | CUTANEOUS | 0 refills | Status: DC

## 2022-11-06 MED ORDER — IPRATROPIUM BROMIDE 0.02 % IN SOLN: 0.5000 mg | Freq: Four times a day (QID) | RESPIRATORY_TRACT | 0 refills | Status: DC | PRN

## 2022-11-06 MED ORDER — IPRATROPIUM-ALBUTEROL 0.5-2.5 (3) MG/3ML IN SOLN
3.0000 mL | Freq: Once | RESPIRATORY_TRACT | Status: AC
  Administered 2022-11-06: 3 mL via RESPIRATORY_TRACT
  Filled 2022-11-06: qty 3

## 2022-11-06 MED ORDER — ALBUTEROL SULFATE (2.5 MG/3ML) 0.083% IN NEBU
2.5000 mg | INHALATION_SOLUTION | Freq: Once | RESPIRATORY_TRACT | Status: AC
  Administered 2022-11-06: 2.5 mg via RESPIRATORY_TRACT
  Filled 2022-11-06: qty 3
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
## 2022-11-06 NOTE — ED Provider Notes (Signed)
[facility] DEPT Provider Note   CSN: [id number] Arrival date & time: 11/06/22  1853     History {Add pertinent medical, surgical, social history, OB history to HPI:1} Chief Complaint  Patient presents with   Shortness of Breath    Audrey Meyer is a 66 y.o. female.  Patient complains of shortness of breath.  She has a history of COPD.   Shortness of Breath      Home Medications Prior to Admission medications   Medication Sig Start Date End Date Taking? Authorizing Provider  ipratropium (ATROVENT) 0.02 % nebulizer solution Take 2.5 mLs (0.5 mg total) by nebulization every 6 (six) hours as needed for wheezing or shortness of breath. 11/06/22  Yes Milton Ferguson, MD  predniSONE (DELTASONE) 20 MG tablet Take 2 to tablets once a day for 5 days 11/06/22  Yes Milton Ferguson, MD  albuterol (VENTOLIN HFA) 108 (90 Base) MCG/ACT inhaler Inhale 2 puffs into the lungs every 4 (four) hours as needed for wheezing or shortness of breath. 11/16/21   Orpah Greek, MD  amLODipine (NORVASC) 10 MG tablet Take 10 mg by mouth daily. 04/17/17   [provider]  atorvastatin (LIPITOR) 20 MG tablet Take 1 tablet (20 mg total) by mouth at bedtime. 05/05/16   Withrow, Elyse Jarvis, FNP  clonazePAM (KLONOPIN) 0.5 MG tablet Take 0.5 mg by mouth 2 (two) times daily. 11/20/21   [provider]  cyclobenzaprine (FLEXERIL) 10 MG tablet Take 10 mg by mouth 3 (three) times daily as needed for muscle spasms. 07/21/21   [provider]  dextromethorphan-guaiFENesin (MUCINEX DM) 30-600 MG 12hr tablet Take 1 tablet by mouth 2 (two) times daily.    [provider]  diclofenac (VOLTAREN) 75 MG EC tablet Take 75 mg by mouth in the morning and at bedtime. 10/15/21   [provider]  doxycycline (VIBRAMYCIN) 100 MG capsule Take 1 capsule (100 mg total) by mouth 2 (two) times daily. One po bid x 7 days 07/15/22   Malvin Johns, MD   fluticasone-salmeterol (ADVAIR HFA) 115-21 MCG/ACT inhaler Inhale 2 puffs into the lungs in the morning and at bedtime. 09/28/21   [provider]  furosemide (LASIX) 20 MG tablet Take 20 mg by mouth daily. 05/01/17   [provider]  gabapentin (NEURONTIN) 600 MG tablet Take 600 mg by mouth 3 (three) times daily.    [provider]  hydrOXYzine (VISTARIL) 25 MG capsule Take 25 mg by mouth 3 (three) times daily. 05/05/17   [provider]  levalbuterol Penne Lash) 0.63 MG/3ML nebulizer solution Take 3 mLs by nebulization 4 (four) times daily as needed for wheezing or shortness of breath. 11/13/21   [provider]  levothyroxine (SYNTHROID) 137 MCG tablet Take 137 mcg by mouth daily before breakfast.    [provider]  mirtazapine (REMERON) 15 MG tablet Take 15 mg by mouth at bedtime. 05/05/17   [provider]  Multiple Vitamin (MULTIVITAMIN WITH MINERALS) TABS tablet Take 1 tablet by mouth daily.    [provider]  paliperidone (INVEGA) 3 MG 24 hr tablet Take 3 mg by mouth every morning. 10/29/21   [provider]  pantoprazole (PROTONIX) 40 MG tablet Take 40 mg by mouth daily. 09/28/21   [provider]  potassium chloride SA (K-DUR,KLOR-CON) 20 MEQ tablet Take 20 mEq by mouth 2 (two) times daily.     [provider]  rOPINIRole (REQUIP) 1 MG tablet Take 2 mg by mouth at  bedtime.    [provider]  sertraline (ZOLOFT) 100 MG tablet Take 100 mg by mouth daily. 05/05/17   [provider]      Allergies    Budesonide, Promethazine, Lisinopril, and Lithium    Review of Systems   Review of Systems  Respiratory:  Positive for shortness of breath.     Physical Exam Updated Vital Signs BP 120/67   Pulse 95   Temp 98 F (36.7 C)   Resp 18   Ht '5\' 2"'$  (1.575 m)   Wt 77.1 kg   SpO2 99%   BMI 31.09 kg/m  Physical Exam  ED Results / Procedures / Treatments   Labs (all labs  ordered are listed, but only abnormal results are displayed) Labs Reviewed  CBC WITH DIFFERENTIAL/PLATELET - Abnormal; Notable for the following components:      Result Value   WBC 10.7 (*)    RBC 3.65 (*)    Hemoglobin 11.0 (*)    HCT 34.1 (*)    Neutro Abs 9.9 (*)    Lymphs Abs 0.4 (*)    Abs Immature Granulocytes 0.16 (*)    All other components within normal limits  COMPREHENSIVE METABOLIC PANEL - Abnormal; Notable for the following components:   Chloride 96 (*)    CO2 39 (*)    Glucose, Bld 208 (*)    BUN 24 (*)    Total Protein 6.4 (*)    All other components within normal limits    EKG None  Radiology DG Chest Port 1 View  Result Date: 11/06/2022 CLINICAL DATA:  Shortness of breath EXAM: PORTABLE CHEST 1 VIEW COMPARISON:  07/15/2022 FINDINGS: Heart is normal size. Aortic calcifications. Hyperinflation compatible with COPD. No confluent airspace opacities or effusions. No acute bony abnormality. IMPRESSION: Mild hyperinflation/COPD. No active disease. Electronically Signed   By: Rolm Baptise M.D.   On: 11/06/2022 19:42    Procedures Procedures  {Document cardiac monitor, telemetry assessment procedure when appropriate:1}  Medications Ordered in ED Medications  magnesium sulfate IVPB 2 g 50 mL (0 g Intravenous Stopped 11/06/22 2146)  ipratropium-albuterol (DUONEB) 0.5-2.5 (3) MG/3ML nebulizer solution 3 mL (3 mLs Nebulization Given 11/06/22 2050)  albuterol (PROVENTIL) (2.5 MG/3ML) 0.083% nebulizer solution 2.5 mg (2.5 mg Nebulization Given 11/06/22 2050)    ED Course/ Medical Decision Making/ A&P  Patient improved with neb treatment and steroids and magnesium.  We will discharge her with prednisone 40 mg a day for 5 days and she is given a prescription of Atrovent neb solutions to be used at home                         Medical Decision Making Amount and/or Complexity of Data Reviewed Labs: ordered. Radiology: ordered.  Risk Prescription drug management.   COPD  exacerbation  {Document critical care time when appropriate:1} {Document review of labs and clinical decision tools ie heart score, Chads2Vasc2 etc:1}  {Document your independent review of radiology images, and any outside records:1} {Document your discussion with family members, caretakers, and with consultants:1} {Document social determinants of health affecting pt's care:1} {Document your decision making why or why not admission, treatments were needed:1} Final Clinical Impression(s) / ED Diagnoses Final diagnoses:  COPD exacerbation ([place])    Rx / DC Orders ED Discharge Orders          Ordered    predniSONE (DELTASONE) 20 MG tablet        11/06/22 2248  ipratropium (ATROVENT) 0.02 % nebulizer solution  Every 6 hours PRN        11/06/22 2248

## 2022-11-06 NOTE — Discharge Instructions (Signed)
Follow-up with your family doctor in the next week for recheck °

## 2022-11-12 ENCOUNTER — Emergency Department (HOSPITAL_COMMUNITY): Payer: Medicare PPO

## 2022-11-12 ENCOUNTER — Encounter (HOSPITAL_COMMUNITY): Payer: Self-pay

## 2022-11-12 ENCOUNTER — Emergency Department (HOSPITAL_COMMUNITY)
Admission: EM | Admit: 2022-11-12 | Discharge: 2022-11-12 | Disposition: A | Discharge status: Home / Self Care | Payer: Medicare PPO | Attending: Emergency Medicine | Admitting: Emergency Medicine

## 2022-11-12 ENCOUNTER — Other Ambulatory Visit: Payer: Self-pay

## 2022-11-12 DIAGNOSIS — J449 Chronic obstructive pulmonary disease, unspecified: Secondary | ICD-10-CM | POA: Insufficient documentation

## 2022-11-12 DIAGNOSIS — Z7951 Long term (current) use of inhaled steroids: Secondary | ICD-10-CM | POA: Insufficient documentation

## 2022-11-12 DIAGNOSIS — Z79899 Other long term (current) drug therapy: Secondary | ICD-10-CM | POA: Insufficient documentation

## 2022-11-12 DIAGNOSIS — E039 Hypothyroidism, unspecified: Secondary | ICD-10-CM | POA: Insufficient documentation

## 2022-11-12 DIAGNOSIS — J441 Chronic obstructive pulmonary disease with (acute) exacerbation: Secondary | ICD-10-CM | POA: Insufficient documentation

## 2022-11-12 DIAGNOSIS — Z20822 Contact with and (suspected) exposure to covid-19: Secondary | ICD-10-CM | POA: Diagnosis not present

## 2022-11-12 DIAGNOSIS — R0602 Shortness of breath: Secondary | ICD-10-CM | POA: Diagnosis present

## 2022-11-12 DIAGNOSIS — F17290 Nicotine dependence, other tobacco product, uncomplicated: Secondary | ICD-10-CM | POA: Diagnosis not present

## 2022-11-12 DIAGNOSIS — I10 Essential (primary) hypertension: Secondary | ICD-10-CM | POA: Diagnosis not present

## 2022-11-12 LAB — BLOOD GAS, VENOUS
Acid-Base Excess: 21.5 mmol/L — ABNORMAL HIGH (ref 0.0–2.0)
Bicarbonate: 50.3 mmol/L — ABNORMAL HIGH (ref 20.0–28.0)
O2 Saturation: 80.1 %
Patient temperature: 36
pCO2, Ven: 71 mmHg (ref 44–60)
pH, Ven: 7.45 — ABNORMAL HIGH (ref 7.25–7.43)
pO2, Ven: 42 mmHg (ref 32–45)

## 2022-11-12 LAB — COMPREHENSIVE METABOLIC PANEL
ALT: 42 U/L (ref 0–44)
AST: 21 U/L (ref 15–41)
Albumin: 3.6 g/dL (ref 3.5–5.0)
Alkaline Phosphatase: 54 U/L (ref 38–126)
Anion gap: 10 (ref 5–15)
BUN: 27 mg/dL — ABNORMAL HIGH (ref 8–23)
CO2: 36 mmol/L — ABNORMAL HIGH (ref 22–32)
Calcium: 9 mg/dL (ref 8.9–10.3)
Chloride: 94 mmol/L — ABNORMAL LOW (ref 98–111)
Creatinine, Ser: 0.95 mg/dL (ref 0.44–1.00)
GFR, Estimated: >60 mL/min (ref >60)
Glucose, Bld: 247 mg/dL — ABNORMAL HIGH (ref 70–99)
Potassium: 4 mmol/L (ref 3.5–5.1)
Sodium: 140 mmol/L (ref 135–145)
Total Bilirubin: 0.6 mg/dL (ref 0.3–1.2)
Total Protein: 6.5 g/dL (ref 6.5–8.1)

## 2022-11-12 LAB — CBC WITH DIFFERENTIAL/PLATELET
Abs Immature Granulocytes: 0.26 10*3/uL — ABNORMAL HIGH (ref 0.00–0.07)
Basophils Absolute: 0 10*3/uL (ref 0.0–0.1)
Basophils Relative: 0 %
Eosinophils Absolute: 0 10*3/uL (ref 0.0–0.5)
Eosinophils Relative: 0 %
HCT: 34.2 % — ABNORMAL LOW (ref 36.0–46.0)
Hemoglobin: 11.1 g/dL — ABNORMAL LOW (ref 12.0–15.0)
Immature Granulocytes: 2 %
Lymphocytes Relative: 4 %
Lymphs Abs: 0.5 10*3/uL — ABNORMAL LOW (ref 0.7–4.0)
MCH: 30.3 pg (ref 26.0–34.0)
MCHC: 32.5 g/dL (ref 30.0–36.0)
MCV: 93.4 fL (ref 80.0–100.0)
Monocytes Absolute: 0.3 10*3/uL (ref 0.1–1.0)
Monocytes Relative: 3 %
Neutro Abs: 11.2 10*3/uL — ABNORMAL HIGH (ref 1.7–7.7)
Neutrophils Relative %: 91 %
Platelets: 271 10*3/uL (ref 150–400)
RBC: 3.66 MIL/uL — ABNORMAL LOW (ref 3.87–5.11)
RDW: 12.8 % (ref 11.5–15.5)
WBC: 12.3 10*3/uL — ABNORMAL HIGH (ref 4.0–10.5)
nRBC: 0.2 % (ref 0.0–0.2)

## 2022-11-12 LAB — RESP PANEL BY RT-PCR (RSV, FLU A&B, COVID)  RVPGX2
Influenza A by PCR: NEGATIVE
Influenza B by PCR: NEGATIVE
Resp Syncytial Virus by PCR: NEGATIVE
SARS Coronavirus 2 by RT PCR: NEGATIVE

## 2022-11-12 MED ORDER — PREDNISONE 50 MG PO TABS: 50.0000 mg | ORAL_TABLET | Freq: Every day | ORAL | 0 refills | Status: DC

## 2022-11-12 MED ORDER — IPRATROPIUM BROMIDE 0.02 % IN SOLN
0.5000 mg | Freq: Once | RESPIRATORY_TRACT | Status: AC
  Administered 2022-11-12: 0.5 mg via RESPIRATORY_TRACT
  Filled 2022-11-12: qty 2.5

## 2022-11-12 MED ORDER — AMOXICILLIN-POT CLAVULANATE 875-125 MG PO TABS: 1.0000 | ORAL_TABLET | Freq: Two times a day (BID) | ORAL | 0 refills | Status: DC

## 2022-11-12 MED ORDER — ALBUTEROL SULFATE (2.5 MG/3ML) 0.083% IN NEBU
10.0000 mg/h | INHALATION_SOLUTION | Freq: Once | RESPIRATORY_TRACT | Status: AC
  Administered 2022-11-12: 10 mg/h via RESPIRATORY_TRACT
  Filled 2022-11-12: qty 12

## 2022-11-12 MED ORDER — METHYLPREDNISOLONE SODIUM SUCC 125 MG IJ SOLR
60.0000 mg | Freq: Once | INTRAMUSCULAR | Status: AC
  Administered 2022-11-12: 60 mg via INTRAVENOUS
  Filled 2022-11-12: qty 2

## 2022-11-12 MED ORDER — SODIUM CHLORIDE 0.9 % IV BOLUS
1000.0000 mL | Freq: Once | INTRAVENOUS | Status: AC
  Administered 2022-11-12: 1000 mL via INTRAVENOUS

## 2022-11-12 NOTE — ED Notes (Signed)
Respiratory at bedside for pt's hour long neb treatment

## 2022-11-12 NOTE — Discharge Instructions (Addendum)
We evaluated you for your difficulty breathing.  Your symptoms are likely due to an exacerbation of your COPD.  Your symptoms improved with breathing treatments and steroids.  I have prescribed you a course of steroids and a course of antibiotics.  Please follow-up with your pulmonologist.

## 2022-11-12 NOTE — ED Triage Notes (Signed)
Patient brought in by EMS from home for SOB. Patient has had SOB x5 days HX of COPD. PCP increased prednisone from '10mg'$  to '40mg'$  today is her last day on '40mg'$  with no improvement. She takes her home neb treatments PRN but is still having wheezing. EMS gave solumedrol 125. 160/80 100 28 96% 4-5L O2

## 2022-11-12 NOTE — ED Provider Notes (Signed)
[facility] DEPT Provider Note  CSN: [id number] Arrival date & time: 11/12/22 1545  Chief Complaint(s) Shortness of Breath  HPI Audrey Meyer is a 66 y.o. female with history of COPD, hypertension, schizoaffective disorder presenting to the emergency department shortness of breath.  Patient reports shortness of breath for the past week.  She has been on prednisone 40 mg, today was the last day. Seen in the ER 12/3. She is usually on 4 L of oxygen, reports that has been using 5 L recently.  She denies chest pain, abdominal pain.  No fevers or chills.  She reports cough with clear sputum.  No back pain.  Symptoms similar to prior episodes of COPD.  Inhalers and nebulizers at home which helps but symptoms returned.   Past Medical History Past Medical History:  Diagnosis Date   Acid reflux    Breast cancer ([place])    COPD (chronic obstructive pulmonary disease) ([place])    Drug overdose 04/24/2017   High cholesterol    Hypertension    Hyponatremia 01/31/2017   Hypothyroid    Personal history of radiation therapy    Schizoaffective disorder    Suicide attempt ([place]) 04/25/2017   Patient Active Problem List   Diagnosis Date Noted   Acute on chronic respiratory failure with hypercapnia ([place]) 12/07/2021   Hypocalcemia 12/07/2021   Normocytic anemia 12/07/2021   GERD (gastroesophageal reflux disease) 11/25/2021   On supplemental oxygen therapy 11/25/2021   Nicotine dependence 11/25/2021   COPD with acute exacerbation ([place]) 11/25/2021   Chronic respiratory failure with hypoxia, on home O2 therapy ([place]) - 4 L/min 11/25/2021   COPD exacerbation ([place]) 11/25/2021   Chronic respiratory failure with hypercapnia ([place]) 11/25/2021   Obesity (BMI 30-39.9) 11/25/2021   Major depressive disorder, recurrent episode, moderate ([place]) 04/28/2016   Schizophrenia, 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 Authorizing Provider  amoxicillin-clavulanate (AUGMENTIN) 875-125 MG tablet Take 1 tablet by mouth every 12 (twelve) hours. 11/12/22  Yes Cristie Hem, MD  predniSONE (DELTASONE) 50 MG tablet Take 1 tablet (50 mg total) by mouth daily for 5 days. 11/12/22 11/17/22 Yes Cristie Hem, MD  albuterol (VENTOLIN HFA) 108 (90 Base) MCG/ACT inhaler Inhale 2 puffs into the lungs every 4 (four) hours as needed for wheezing or shortness of breath. 11/16/21   Orpah Greek, MD  amLODipine (NORVASC) 10 MG tablet Take 10 mg by mouth daily. 04/17/17   [provider]  atorvastatin (LIPITOR) 20 MG tablet Take 1 tablet (20 mg total) by mouth at bedtime. 05/05/16   [place], Elyse Jarvis, FNP  clonazePAM (KLONOPIN) 0.5 MG tablet Take 0.5 mg by mouth 2 (two) times daily. 11/20/21   [provider]  cyclobenzaprine (FLEXERIL) 10 MG tablet Take 10 mg by mouth 3 (three) times daily as needed for muscle spasms. 07/21/21   [provider]  dextromethorphan-guaiFENesin (MUCINEX DM) 30-600 MG 12hr tablet Take 1 tablet by mouth 2 (two) times daily.    [provider]  diclofenac (VOLTAREN) 75 MG EC tablet Take 75 mg by mouth in the morning and at bedtime. 10/15/21   [provider]  fluticasone-salmeterol (ADVAIR HFA) 115-21 MCG/ACT inhaler Inhale 2 puffs into the lungs in  the morning and at bedtime. 09/28/21   [provider]  furosemide (LASIX) 20 MG tablet Take 20 mg by mouth daily. 05/01/17   [provider]  gabapentin (NEURONTIN) 600 MG tablet Take 600 mg by mouth 3 (three) times daily.    [provider]  hydrOXYzine (VISTARIL) 25 MG capsule Take 25 mg by mouth 3 (three) times daily. 05/05/17    [provider]  ipratropium (ATROVENT) 0.02 % nebulizer solution Take 2.5 mLs (0.5 mg total) by nebulization every 6 (six) hours as needed for wheezing or shortness of breath. 11/06/22   Milton Ferguson, MD  levalbuterol Penne Lash) 0.63 MG/3ML nebulizer solution Take 3 mLs by nebulization 4 (four) times daily as needed for wheezing or shortness of breath. 11/13/21   [provider]  levothyroxine (SYNTHROID) 137 MCG tablet Take 137 mcg by mouth daily before breakfast.    [provider]  mirtazapine (REMERON) 15 MG tablet Take 15 mg by mouth at bedtime. 05/05/17   [provider]  Multiple Vitamin (MULTIVITAMIN WITH MINERALS) TABS tablet Take 1 tablet by mouth daily.    [provider]  mupirocin cream (BACTROBAN) 2 % Apply 1 Application topically 2 (two) times daily. 11/06/22   Wyvonnia Dusky, MD  paliperidone (INVEGA) 3 MG 24 hr tablet Take 3 mg by mouth every morning. 10/29/21   [provider]  pantoprazole (PROTONIX) 40 MG tablet Take 40 mg by mouth daily. 09/28/21   [provider]  potassium chloride SA (K-DUR,KLOR-CON) 20 MEQ tablet Take 20 mEq by mouth 2 (two) times daily.     [provider]  rOPINIRole (REQUIP) 1 MG tablet Take 2 mg by mouth at bedtime.    [provider]  sertraline (ZOLOFT) 100 MG tablet Take 100 mg by mouth daily. 05/05/17   [provider]                                                                                                                                    Past Surgical History Past Surgical History:  Procedure Laterality Date   APPENDECTOMY     BREAST LUMPECTOMY Left 2004   TUBAL LIGATION     Family History Family History  Problem Relation Age of Onset   Heart attack Father    Hypertension Father    Cancer Other     Social History Social History   Tobacco Use   Smoking status: Some Days    Packs/day: 0.00    Years: 2.00    Total pack years: 0.00     Types: Cigars, Cigarettes   Smokeless tobacco: Never  Vaping Use   Vaping Use: Never used  Substance Use Topics   Alcohol use: No   Drug use: Yes    Types: Marijuana   Allergies Budesonide, Promethazine, Lisinopril, and Lithium  Review of Systems Review of Systems  All other systems reviewed and are negative.  Physical Exam Vital Signs  I have reviewed the triage vital signs BP (!) 164/98   Pulse (!) 110   Temp 98.2 F (36.8 C) (Oral)   Resp (!) 22   Ht '5\' 2"'$  (1.575 m)   Wt 80.3 kg   SpO2 97%   BMI 32.37 kg/m  Physical Exam Vitals and nursing note reviewed.  Constitutional:      General: She is not in acute distress.    Appearance: She is well-developed. She is obese.  HENT:     Head: Normocephalic and atraumatic.     Mouth/Throat:     Mouth: Mucous membranes are moist.  Eyes:     Pupils: Pupils are equal, round, and reactive to light.  Cardiovascular:     Rate and Rhythm: Normal rate and regular rhythm.     Heart sounds: No murmur heard. Pulmonary:     Breath sounds: Examination of the right-upper field reveals wheezing. Examination of the left-upper field reveals wheezing. Examination of the right-middle field reveals wheezing. Examination of the left-middle field reveals wheezing. Examination of the right-lower field reveals wheezing. Examination of the left-lower field reveals wheezing. Wheezing present.     Comments: Mild increased work of breathing and mild tachypnea Abdominal:     General: Abdomen is flat.     Palpations: Abdomen is soft.     Tenderness: There is no abdominal tenderness.  Musculoskeletal:        General: No tenderness.     Right lower leg: No edema.     Left lower leg: No edema.  Skin:    General: Skin is warm and dry.  Neurological:     General: No focal deficit present.     Mental Status: She is alert. Mental status is at baseline.  Psychiatric:        Mood and Affect: Mood normal.        Behavior: Behavior normal.     ED  Results and Treatments Labs (all labs ordered are listed, but only abnormal results are displayed) Labs Reviewed  COMPREHENSIVE METABOLIC PANEL - Abnormal; Notable for the following components:      Result Value   Chloride 94 (*)    CO2 36 (*)    Glucose, Bld 247 (*)    BUN 27 (*)    All other components within normal limits  CBC WITH DIFFERENTIAL/PLATELET - Abnormal; Notable for the following components:   WBC 12.3 (*)    RBC 3.66 (*)    Hemoglobin 11.1 (*)    HCT 34.2 (*)    Neutro Abs 11.2 (*)    Lymphs Abs 0.5 (*)    Abs Immature Granulocytes 0.26 (*)    All other components within normal limits  BLOOD GAS, VENOUS - Abnormal; Notable for the following components:   pH, Ven 7.45 (*)    pCO2, Ven 71 (*)    Bicarbonate 50.3 (*)    Acid-Base Excess 21.5 (*)    All other components within normal limits  RESP PANEL BY RT-PCR (RSV, FLU A&B, COVID)  RVPGX2  Radiology DG Chest Port 1 View  Result Date: 11/12/2022 CLINICAL DATA:  Five day history of shortness of breath EXAM: PORTABLE CHEST 1 VIEW COMPARISON:  Chest radiograph dated 11/06/2022 FINDINGS: The patient is rotated slightly to the left. Normal lung volumes. No focal consolidations. No pleural effusion or pneumothorax. Similar mildly enlarged cardiomediastinal silhouette. The visualized skeletal structures are unremarkable. IMPRESSION: No active disease. Electronically Signed   By: Darrin Nipper M.D.   On: 11/12/2022 16:43    Pertinent labs & imaging results that were available during my care of the patient were reviewed by me and considered in my medical decision making (see MDM for details).  Medications Ordered in ED Medications  methylPREDNISolone sodium succinate (SOLU-MEDROL) 125 mg/2 mL injection 60 mg (60 mg Intravenous Given 11/12/22 1652)  ipratropium (ATROVENT) nebulizer solution 0.5 mg (0.5 mg  Nebulization Given 11/12/22 1809)  sodium chloride 0.9 % bolus 1,000 mL (0 mLs Intravenous Stopped 11/12/22 1922)  albuterol (PROVENTIL) (2.5 MG/3ML) 0.083% nebulizer solution (10 mg/hr Nebulization Given 11/12/22 1808)                                                                                                                                     Procedures Procedures  (including critical care time)  Medical Decision Making / ED Course   MDM:  66 year old female presenting to the emergency department for shortness of breath.  Suspect COPD exacerbation.  Patient reports this feels like her similar COPD exacerbation.  She has diffuse wheezing on exam.  Received steroids here.  Will give continuous neb.  Will reassess.  VBG notable for likely chronic hypercapnia which is compensated.  Not hypoxic on typical oxygen.  Chest x-ray without evidence of pneumonia.  No pneumothorax on chest x-ray.  Doubt PE with no chest pain, hypoxia.  Will reassess.  Clinical Course as of 11/12/22 2101  Sat Nov 12, 2022  1758 Patient does feel better. Still pending breathing treatment.  [WS]  2058 Patient feels better after breathing treatment.  Patient able to ambulate without desaturation on her typical oxygen.  Patient developed mild tachycardia after being on albuterol neb for 1 hour.  Will restart prednisone taper.  Will treat with Augmentin for COPD exacerbation. Will discharge patient to home. All questions answered. Patient comfortable with plan of discharge. Return precautions discussed with patient and specified on the after visit summary.  [WS]    Clinical Course User Index [WS] Cristie Hem, MD     Additional history obtained: -Additional history obtained from ems -External records from outside source obtained and reviewed including: Chart review including previous notes, labs, imaging, consultation notes including ED visit 11/06/22   Lab Tests: -I ordered, reviewed, and interpreted  labs.   The pertinent results include:   Labs Reviewed  COMPREHENSIVE METABOLIC PANEL - Abnormal; Notable for the following components:      Result Value   Chloride 94 (*)  CO2 36 (*)    Glucose, Bld 247 (*)    BUN 27 (*)    All other components within normal limits  CBC WITH DIFFERENTIAL/PLATELET - Abnormal; Notable for the following components:   WBC 12.3 (*)    RBC 3.66 (*)    Hemoglobin 11.1 (*)    HCT 34.2 (*)    Neutro Abs 11.2 (*)    Lymphs Abs 0.5 (*)    Abs Immature Granulocytes 0.26 (*)    All other components within normal limits  BLOOD GAS, VENOUS - Abnormal; Notable for the following components:   pH, Ven 7.45 (*)    pCO2, Ven 71 (*)    Bicarbonate 50.3 (*)    Acid-Base Excess 21.5 (*)    All other components within normal limits  RESP PANEL BY RT-PCR (RSV, FLU A&B, COVID)  RVPGX2    Notable for chronic hypercapnea and chronic metabolic acidosis, mild leukocytosis  EKG   EKG Interpretation  Date/Time:  Saturday November 12 2022 16:10:51 EST Ventricular Rate:  100 PR Interval:  127 QRS Duration: 102 QT Interval:  353 QTC Calculation: 456 R Axis:   95 Text Interpretation: Sinus tachycardia Probable left atrial enlargement Right axis deviation Confirmed by Garnette Gunner [phone]) on 11/12/2022 4:54:26 PM         Imaging Studies ordered: I ordered imaging studies including CXR On my interpretation imaging demonstrates no pneumonia I independently visualized and interpreted imaging. I agree with the radiologist interpretation   Medicines ordered and prescription drug management: Meds ordered this encounter  Medications   methylPREDNISolone sodium succinate (SOLU-MEDROL) 125 mg/2 mL injection 60 mg    IV methylprednisolone will be converted to either a q12h or q24h frequency with the same total daily dose (TDD).  Ordered Dose: 1 to 125 mg TDD; convert to: TDD q24h.  Ordered Dose: 126 to 250 mg TDD; convert to: TDD div q12h.  Ordered Dose: >250 mg  TDD; DAW.   ipratropium (ATROVENT) nebulizer solution 0.5 mg   sodium chloride 0.9 % bolus 1,000 mL   albuterol (PROVENTIL) (2.5 MG/3ML) 0.083% nebulizer solution   predniSONE (DELTASONE) 50 MG tablet    Sig: Take 1 tablet (50 mg total) by mouth daily for 5 days.    Dispense:  5 tablet    Refill:  0   amoxicillin-clavulanate (AUGMENTIN) 875-125 MG tablet    Sig: Take 1 tablet by mouth every 12 (twelve) hours.    Dispense:  14 tablet    Refill:  0    -I have reviewed the patients home medicines and have made adjustments as needed  Cardiac Monitoring: The patient was maintained on a cardiac monitor.  I personally viewed and interpreted the cardiac monitored which showed an underlying rhythm of: NSR  Social Determinants of Health:  Diagnosis or treatment significantly limited by social determinants of health: current smoker   Reevaluation: After the interventions noted above, I reevaluated the patient and found that they have improved  Co morbidities that complicate the patient evaluation  Past Medical History:  Diagnosis Date   Acid reflux    Breast cancer ([place])    COPD (chronic obstructive pulmonary disease) ([place])    Drug overdose 04/24/2017   High cholesterol    Hypertension    Hyponatremia 01/31/2017   Hypothyroid    Personal history of radiation therapy    Schizoaffective disorder    Suicide attempt ([place]) 04/25/2017      Dispostion: Disposition decision including need for hospitalization was considered, and  patient discharged from emergency department.    Final Clinical Impression(s) / ED Diagnoses Final diagnoses:  COPD exacerbation ([place])     This chart was dictated using voice recognition software.  Despite best efforts to proofread,  errors can occur which can change the documentation meaning.    Cristie Hem, MD 11/12/22 2101

## 2022-11-16 ENCOUNTER — Other Ambulatory Visit: Payer: Self-pay

## 2022-11-16 ENCOUNTER — Encounter (HOSPITAL_COMMUNITY): Payer: Self-pay | Admitting: Internal Medicine

## 2022-11-16 ENCOUNTER — Observation Stay (HOSPITAL_COMMUNITY)
Admission: EM | Admit: 2022-11-16 | Discharge: 2022-11-18 | Disposition: A | Discharge status: Home / Self Care | Payer: Medicare PPO | Attending: Internal Medicine | Admitting: Internal Medicine

## 2022-11-16 ENCOUNTER — Emergency Department (HOSPITAL_COMMUNITY): Payer: Medicare PPO

## 2022-11-16 DIAGNOSIS — R7989 Other specified abnormal findings of blood chemistry: Secondary | ICD-10-CM | POA: Insufficient documentation

## 2022-11-16 DIAGNOSIS — K219 Gastro-esophageal reflux disease without esophagitis: Secondary | ICD-10-CM | POA: Diagnosis present

## 2022-11-16 DIAGNOSIS — F17290 Nicotine dependence, other tobacco product, uncomplicated: Secondary | ICD-10-CM | POA: Insufficient documentation

## 2022-11-16 DIAGNOSIS — Z79899 Other long term (current) drug therapy: Secondary | ICD-10-CM | POA: Diagnosis not present

## 2022-11-16 DIAGNOSIS — F17210 Nicotine dependence, cigarettes, uncomplicated: Secondary | ICD-10-CM | POA: Diagnosis not present

## 2022-11-16 DIAGNOSIS — E039 Hypothyroidism, unspecified: Secondary | ICD-10-CM | POA: Insufficient documentation

## 2022-11-16 DIAGNOSIS — Z720 Tobacco use: Secondary | ICD-10-CM | POA: Diagnosis present

## 2022-11-16 DIAGNOSIS — I10 Essential (primary) hypertension: Secondary | ICD-10-CM | POA: Diagnosis not present

## 2022-11-16 DIAGNOSIS — R0602 Shortness of breath: Secondary | ICD-10-CM | POA: Diagnosis present

## 2022-11-16 DIAGNOSIS — J9622 Acute and chronic respiratory failure with hypercapnia: Secondary | ICD-10-CM | POA: Diagnosis not present

## 2022-11-16 DIAGNOSIS — E669 Obesity, unspecified: Secondary | ICD-10-CM | POA: Diagnosis not present

## 2022-11-16 DIAGNOSIS — J441 Chronic obstructive pulmonary disease with (acute) exacerbation: Secondary | ICD-10-CM

## 2022-11-16 DIAGNOSIS — Z853 Personal history of malignant neoplasm of breast: Secondary | ICD-10-CM | POA: Insufficient documentation

## 2022-11-16 DIAGNOSIS — D649 Anemia, unspecified: Secondary | ICD-10-CM | POA: Diagnosis present

## 2022-11-16 DIAGNOSIS — Z6832 Body mass index (BMI) 32.0-32.9, adult: Secondary | ICD-10-CM | POA: Diagnosis not present

## 2022-11-16 DIAGNOSIS — Z9981 Dependence on supplemental oxygen: Secondary | ICD-10-CM | POA: Diagnosis not present

## 2022-11-16 DIAGNOSIS — G2581 Restless legs syndrome: Secondary | ICD-10-CM | POA: Insufficient documentation

## 2022-11-16 DIAGNOSIS — F251 Schizoaffective disorder, depressive type: Secondary | ICD-10-CM | POA: Diagnosis present

## 2022-11-16 LAB — CBC WITH DIFFERENTIAL/PLATELET
Abs Immature Granulocytes: 0.46 10*3/uL — ABNORMAL HIGH (ref 0.00–0.07)
Basophils Absolute: 0 10*3/uL (ref 0.0–0.1)
Basophils Relative: 0 %
Eosinophils Absolute: 0.1 10*3/uL (ref 0.0–0.5)
Eosinophils Relative: 1 %
HCT: 33.3 % — ABNORMAL LOW (ref 36.0–46.0)
Hemoglobin: 10.5 g/dL — ABNORMAL LOW (ref 12.0–15.0)
Immature Granulocytes: 4 %
Lymphocytes Relative: 14 %
Lymphs Abs: 1.8 10*3/uL (ref 0.7–4.0)
MCH: 30.3 pg (ref 26.0–34.0)
MCHC: 31.5 g/dL (ref 30.0–36.0)
MCV: 96.2 fL (ref 80.0–100.0)
Monocytes Absolute: 1.2 10*3/uL — ABNORMAL HIGH (ref 0.1–1.0)
Monocytes Relative: 9 %
Neutro Abs: 9.7 10*3/uL — ABNORMAL HIGH (ref 1.7–7.7)
Neutrophils Relative %: 72 %
Platelets: 238 10*3/uL (ref 150–400)
RBC: 3.46 MIL/uL — ABNORMAL LOW (ref 3.87–5.11)
RDW: 13.2 % (ref 11.5–15.5)
WBC: 13.3 10*3/uL — ABNORMAL HIGH (ref 4.0–10.5)
nRBC: 0.3 % — ABNORMAL HIGH (ref 0.0–0.2)

## 2022-11-16 LAB — COMPREHENSIVE METABOLIC PANEL
ALT: 48 U/L — ABNORMAL HIGH (ref 0–44)
AST: 33 U/L (ref 15–41)
Albumin: 3.5 g/dL (ref 3.5–5.0)
Alkaline Phosphatase: 53 U/L (ref 38–126)
Anion gap: 10 (ref 5–15)
BUN: 29 mg/dL — ABNORMAL HIGH (ref 8–23)
CO2: 34 mmol/L — ABNORMAL HIGH (ref 22–32)
Calcium: 9.4 mg/dL (ref 8.9–10.3)
Chloride: 98 mmol/L (ref 98–111)
Creatinine, Ser: 0.9 mg/dL (ref 0.44–1.00)
GFR, Estimated: >60 mL/min (ref >60)
Glucose, Bld: 174 mg/dL — ABNORMAL HIGH (ref 70–99)
Potassium: 3.7 mmol/L (ref 3.5–5.1)
Sodium: 142 mmol/L (ref 135–145)
Total Bilirubin: 0.5 mg/dL (ref 0.3–1.2)
Total Protein: 6 g/dL — ABNORMAL LOW (ref 6.5–8.1)

## 2022-11-16 LAB — BLOOD GAS, VENOUS
Acid-Base Excess: 14.5 mmol/L — ABNORMAL HIGH (ref 0.0–2.0)
Bicarbonate: 43.2 mmol/L — ABNORMAL HIGH (ref 20.0–28.0)
O2 Saturation: 76.5 %
Patient temperature: 37
pCO2, Ven: 73 mmHg (ref 44–60)
pH, Ven: 7.38 (ref 7.25–7.43)
pO2, Ven: 44 mmHg (ref 32–45)

## 2022-11-16 LAB — TROPONIN I (HIGH SENSITIVITY)
Troponin I (High Sensitivity): 27 ng/L — ABNORMAL HIGH (ref <18)
Troponin I (High Sensitivity): 25 ng/L — ABNORMAL HIGH (ref <18)

## 2022-11-16 LAB — CBG MONITORING, ED: Glucose-Capillary: 184 mg/dL — ABNORMAL HIGH (ref 70–99)

## 2022-11-16 MED ORDER — GABAPENTIN 300 MG PO CAPS
600.0000 mg | ORAL_CAPSULE | Freq: Three times a day (TID) | ORAL | Status: DC
  Administered 2022-11-16 – 2022-11-18 (×6): 600 mg via ORAL
  Filled 2022-11-16 (×6): qty 2

## 2022-11-16 MED ORDER — ALBUTEROL (5 MG/ML) CONTINUOUS INHALATION SOLN: 10.0000 mg/h | INHALATION_SOLUTION | Freq: Once | RESPIRATORY_TRACT | Status: DC

## 2022-11-16 MED ORDER — HYDROXYZINE HCL 25 MG PO TABS
25.0000 mg | ORAL_TABLET | Freq: Three times a day (TID) | ORAL | Status: DC
  Administered 2022-11-16 – 2022-11-18 (×6): 25 mg via ORAL
  Filled 2022-11-16 (×6): qty 1

## 2022-11-16 MED ORDER — SERTRALINE HCL 50 MG PO TABS: 100.0000 mg | ORAL_TABLET | Freq: Every day | ORAL | Status: DC

## 2022-11-16 MED ORDER — ONDANSETRON HCL 4 MG PO TABS: 4.0000 mg | ORAL_TABLET | Freq: Four times a day (QID) | ORAL | Status: DC | PRN

## 2022-11-16 MED ORDER — IPRATROPIUM-ALBUTEROL 0.5-2.5 (3) MG/3ML IN SOLN
3.0000 mL | Freq: Four times a day (QID) | RESPIRATORY_TRACT | Status: DC
  Administered 2022-11-16 (×3): 3 mL via RESPIRATORY_TRACT
  Filled 2022-11-16 (×3): qty 3

## 2022-11-16 MED ORDER — CLONAZEPAM 0.5 MG PO TABS
0.5000 mg | ORAL_TABLET | Freq: Two times a day (BID) | ORAL | Status: DC
  Administered 2022-11-16 – 2022-11-18 (×5): 0.5 mg via ORAL
  Filled 2022-11-16 (×5): qty 1

## 2022-11-16 MED ORDER — ACETAMINOPHEN 650 MG RE SUPP: 650.0000 mg | Freq: Four times a day (QID) | RECTAL | Status: DC | PRN

## 2022-11-16 MED ORDER — ROPINIROLE HCL 1 MG PO TABS
2.0000 mg | ORAL_TABLET | Freq: Every day | ORAL | Status: DC
  Administered 2022-11-16 – 2022-11-17 (×2): 2 mg via ORAL
  Filled 2022-11-16 (×2): qty 2

## 2022-11-16 MED ORDER — NICOTINE 21 MG/24HR TD PT24
21.0000 mg | MEDICATED_PATCH | Freq: Once | TRANSDERMAL | Status: AC
  Administered 2022-11-16: 21 mg via TRANSDERMAL
  Filled 2022-11-16: qty 1

## 2022-11-16 MED ORDER — POTASSIUM CHLORIDE CRYS ER 20 MEQ PO TBCR
20.0000 meq | EXTENDED_RELEASE_TABLET | Freq: Two times a day (BID) | ORAL | Status: DC
  Administered 2022-11-16 – 2022-11-18 (×4): 20 meq via ORAL
  Filled 2022-11-16 (×4): qty 1

## 2022-11-16 MED ORDER — ONDANSETRON HCL 4 MG/2ML IJ SOLN: 4.0000 mg | Freq: Four times a day (QID) | INTRAMUSCULAR | Status: DC | PRN

## 2022-11-16 MED ORDER — ACETAMINOPHEN 325 MG PO TABS
650.0000 mg | ORAL_TABLET | Freq: Four times a day (QID) | ORAL | Status: DC | PRN
  Administered 2022-11-18: 650 mg via ORAL
  Filled 2022-11-16: qty 2

## 2022-11-16 MED ORDER — IPRATROPIUM BROMIDE 0.02 % IN SOLN
0.5000 mg | Freq: Once | RESPIRATORY_TRACT | Status: AC
  Administered 2022-11-16: 0.5 mg via RESPIRATORY_TRACT
  Filled 2022-11-16: qty 2.5

## 2022-11-16 MED ORDER — METHYLPREDNISOLONE SODIUM SUCC 40 MG IJ SOLR
40.0000 mg | INTRAMUSCULAR | Status: AC
  Administered 2022-11-16: 40 mg via INTRAVENOUS
  Filled 2022-11-16: qty 1

## 2022-11-16 MED ORDER — FUROSEMIDE 20 MG PO TABS
20.0000 mg | ORAL_TABLET | Freq: Every day | ORAL | Status: DC
  Administered 2022-11-16 – 2022-11-17 (×2): 20 mg via ORAL
  Filled 2022-11-16 (×2): qty 1

## 2022-11-16 MED ORDER — ALBUTEROL SULFATE (2.5 MG/3ML) 0.083% IN NEBU
10.0000 mg | INHALATION_SOLUTION | Freq: Once | RESPIRATORY_TRACT | Status: AC
  Administered 2022-11-16: 10 mg via RESPIRATORY_TRACT
  Filled 2022-11-16: qty 12

## 2022-11-16 MED ORDER — LISINOPRIL 10 MG PO TABS
5.0000 mg | ORAL_TABLET | Freq: Every day | ORAL | Status: DC
  Administered 2022-11-16 – 2022-11-18 (×3): 5 mg via ORAL
  Filled 2022-11-16 (×3): qty 1

## 2022-11-16 MED ORDER — HYDROXYZINE PAMOATE 25 MG PO CAPS
25.0000 mg | ORAL_CAPSULE | Freq: Three times a day (TID) | ORAL | Status: DC
  Filled 2022-11-16 (×2): qty 1

## 2022-11-16 MED ORDER — PANTOPRAZOLE SODIUM 40 MG PO TBEC
40.0000 mg | DELAYED_RELEASE_TABLET | Freq: Every day | ORAL | Status: DC
  Administered 2022-11-17 – 2022-11-18 (×2): 40 mg via ORAL
  Filled 2022-11-16 (×2): qty 1

## 2022-11-16 MED ORDER — POLYETHYLENE GLYCOL 3350 17 G PO PACK
17.0000 g | PACK | Freq: Every day | ORAL | Status: DC
  Administered 2022-11-16 – 2022-11-18 (×3): 17 g via ORAL
  Filled 2022-11-16 (×3): qty 1

## 2022-11-16 MED ORDER — ALBUTEROL SULFATE (2.5 MG/3ML) 0.083% IN NEBU
2.5000 mg | INHALATION_SOLUTION | RESPIRATORY_TRACT | Status: DC | PRN
  Administered 2022-11-17 (×3): 2.5 mg via RESPIRATORY_TRACT
  Filled 2022-11-16 (×3): qty 3

## 2022-11-16 MED ORDER — PREDNISONE 20 MG PO TABS
40.0000 mg | ORAL_TABLET | Freq: Every day | ORAL | Status: DC
  Administered 2022-11-17 – 2022-11-18 (×2): 40 mg via ORAL
  Filled 2022-11-16 (×2): qty 2

## 2022-11-16 MED ORDER — LEVOTHYROXINE SODIUM 25 MCG PO TABS
137.0000 ug | ORAL_TABLET | Freq: Every day | ORAL | Status: DC
  Administered 2022-11-17 – 2022-11-18 (×2): 137 ug via ORAL
  Filled 2022-11-16 (×3): qty 1

## 2022-11-16 MED ORDER — MIRTAZAPINE 15 MG PO TABS
15.0000 mg | ORAL_TABLET | Freq: Every day | ORAL | Status: DC
  Administered 2022-11-16 – 2022-11-17 (×2): 15 mg via ORAL
  Filled 2022-11-16 (×2): qty 1

## 2022-11-16 NOTE — H&P (Signed)
History and Physical    Patient: Audrey Meyer PJK:[id number] DOB: 26-Jan-1956 DOA: 11/16/2022 DOS: the patient was seen and examined on 11/16/2022 PCP: Ronald Pippins, NP  Patient coming from: Home  Chief Complaint:  Chief Complaint  Patient presents with   Shortness of Breath   HPI: Audrey Meyer is a 66 y.o. female with medical history significant of GERD, breast cancer, history of radiation therapy, drug overdose, suicide attempt, schizoaffective disorder, hyperlipidemia, hypertension, hyponatremia, hypothyroidism, COPD with chronic respiratory failure on home oxygen who has presented for the third time in the last 10 days to the emergency department due to dyspnea.  10 days ago on her first visit she had a nostril burn while smoking.  She was given albuterol and a prednisone taper.  She returned again 6 days later her, given bronchodilators and discharged home on prednisone taper.  She stated that she was getting better, but started smoking again developing severe dyspnea and wheezing.  EMS was called.  They gave air a continuous neb, Solu-Medrol 125 mg IVP and magnesium sulfate 2 g IVPB.  No sick contacts or travel history.  No fever, chills or night sweats. No sore throat, rhinorrhea or hemoptysis.  No chest pain, palpitations, diaphoresis, PND, orthopnea or recent pitting edema of the lower extremities.  No appetite changes, abdominal pain, diarrhea, melena or hematochezia.  She gets occasionally constipated.  No flank pain, dysuria, frequency or hematuria.  No polyuria, polydipsia, polyphagia or blurred vision.  ED course: Initial vital signs were temperature 97.3 F, pulse 95, respiration 25, BP 129/75 mmHg O2 sat 97% on CPAP.  The patient was switched to BiPAP and given albuterol 10 mg plus ipratropium 0.5 mg continuous neb x 1.  Lab work: CBC showed a white count of 13.3 with 72% neutrophils, hemoglobin 10.5 g/dL platelets 238.  Venous blood gas with a pH of 7.38, pCO2 of 73 and pO2  of 44 mmHg.  Bicarbonate was 43.2 and acid-base excess was 14.5 mmol/L.  Troponin was 27 then 25 ng/L.  CMP with a CO2 of 34 mmol/L, the rest of the electrolytes were normal.  Glucose 174, BUN 29 and creatinine 0.90 mg/dL.  Total protein 6.0 g/dL and ALT 48 units/L.  The rest of the LFTs were normal.  Imaging: Portable 1 view chest radiograph with COPD without acute abnormality.   Review of Systems: As mentioned in the history of present illness. All other systems reviewed and are negative.  Past Medical History:  Diagnosis Date   Acid reflux    Breast cancer ([place])    COPD (chronic obstructive pulmonary disease) ([place])    Drug overdose 04/24/2017   High cholesterol    Hypertension    Hyponatremia 01/31/2017   Hypothyroid    Personal history of radiation therapy    Schizoaffective disorder    Suicide attempt ([place]) 04/25/2017   Past Surgical History:  Procedure Laterality Date   APPENDECTOMY     BREAST LUMPECTOMY Left 2004   TUBAL LIGATION     Social History:  reports that she has been smoking cigars and cigarettes. She has never used smokeless tobacco. She reports current drug use. Drug: Marijuana. She reports that she does not drink alcohol.  Allergies  Allergen Reactions   Budesonide Shortness Of Breath   Promethazine Nausea And Vomiting   Lisinopril Other (See Comments)    "Dizzy spells"  patient reports that this medication causes dizziness and she can "take it in small doses, like '5mg'$ ".   Lithium  Family History  Problem Relation Age of Onset   Heart attack Father    Hypertension Father    Cancer Other     Prior to Admission medications   Medication Sig Start Date End Date Taking? Authorizing Provider  albuterol (VENTOLIN HFA) 108 (90 Base) MCG/ACT inhaler Inhale 2 puffs into the lungs every 4 (four) hours as needed for wheezing or shortness of breath. 11/16/21   Orpah Greek, MD  amLODipine (NORVASC) 10 MG tablet Take 10 mg by mouth daily. 04/17/17    [provider]  amoxicillin-clavulanate (AUGMENTIN) 875-125 MG tablet Take 1 tablet by mouth every 12 (twelve) hours. 11/12/22   Cristie Hem, MD  atorvastatin (LIPITOR) 20 MG tablet Take 1 tablet (20 mg total) by mouth at bedtime. 05/05/16   Withrow, Elyse Jarvis, FNP  clonazePAM (KLONOPIN) 0.5 MG tablet Take 0.5 mg by mouth 2 (two) times daily. 11/20/21   [provider]  cyclobenzaprine (FLEXERIL) 10 MG tablet Take 10 mg by mouth 3 (three) times daily as needed for muscle spasms. 07/21/21   [provider]  dextromethorphan-guaiFENesin (MUCINEX DM) 30-600 MG 12hr tablet Take 1 tablet by mouth 2 (two) times daily.    [provider]  diclofenac (VOLTAREN) 75 MG EC tablet Take 75 mg by mouth in the morning and at bedtime. 10/15/21   [provider]  fluticasone-salmeterol (ADVAIR HFA) 115-21 MCG/ACT inhaler Inhale 2 puffs into the lungs in the morning and at bedtime. 09/28/21   [provider]  furosemide (LASIX) 20 MG tablet Take 20 mg by mouth daily. 05/01/17   [provider]  gabapentin (NEURONTIN) 600 MG tablet Take 600 mg by mouth 3 (three) times daily.    [provider]  hydrOXYzine (VISTARIL) 25 MG capsule Take 25 mg by mouth 3 (three) times daily. 05/05/17   [provider]  ipratropium (ATROVENT) 0.02 % nebulizer solution Take 2.5 mLs (0.5 mg total) by nebulization every 6 (six) hours as needed for wheezing or shortness of breath. 11/06/22   Milton Ferguson, MD  levalbuterol Penne Lash) 0.63 MG/3ML nebulizer solution Take 3 mLs by nebulization 4 (four) times daily as needed for wheezing or shortness of breath. 11/13/21   [provider]  levothyroxine (SYNTHROID) 137 MCG tablet Take 137 mcg by mouth daily before breakfast.    [provider]  mirtazapine (REMERON) 15 MG tablet Take 15 mg by mouth at bedtime. 05/05/17   [provider]  Multiple Vitamin (MULTIVITAMIN WITH MINERALS) TABS tablet  Take 1 tablet by mouth daily.    [provider]  mupirocin cream (BACTROBAN) 2 % Apply 1 Application topically 2 (two) times daily. 11/06/22   Wyvonnia Dusky, MD  paliperidone (INVEGA) 3 MG 24 hr tablet Take 3 mg by mouth every morning. 10/29/21   [provider]  pantoprazole (PROTONIX) 40 MG tablet Take 40 mg by mouth daily. 09/28/21   [provider]  potassium chloride SA (K-DUR,KLOR-CON) 20 MEQ tablet Take 20 mEq by mouth 2 (two) times daily.     [provider]  predniSONE (DELTASONE) 50 MG tablet Take 1 tablet (50 mg total) by mouth daily for 5 days. 11/12/22 11/17/22  Cristie Hem, MD  rOPINIRole (REQUIP) 1 MG tablet Take 2 mg by mouth at bedtime.    [provider]  sertraline (ZOLOFT) 100 MG tablet Take 100 mg by mouth daily. 05/05/17   [provider]    Physical Exam: Vitals:   11/16/22 5009 11/16/22 0945  11/16/22 1015 11/16/22 1030  BP: (!) 151/77 136/85 (!) 148/96 (!) 149/88  Pulse: 93 97 95 94  Resp: 20 18 (!) 23 18  Temp:      TempSrc:      SpO2: 100% 100% 100% 100%  Weight:      Height:       Physical Exam Vitals and nursing note reviewed.  Constitutional:      Appearance: She is obese.  HENT:     Head: Normocephalic.     Nose: No rhinorrhea.     Mouth/Throat:     Mouth: Mucous membranes are moist.  Eyes:     General: No scleral icterus.    Pupils: Pupils are equal, round, and reactive to light.  Neck:     Vascular: No JVD.  Cardiovascular:     Rate and Rhythm: Regular rhythm. Tachycardia present.  Pulmonary:     Effort: Tachypnea present.     Breath sounds: Decreased breath sounds and wheezing present. No rhonchi or rales.  Abdominal:     General: Bowel sounds are normal.     Palpations: Abdomen is soft.     Tenderness: There is no abdominal tenderness.  Musculoskeletal:     Cervical back: Neck supple.     Right lower leg: No edema.     Left lower leg: No edema.  Skin:    General: Skin is  warm and dry.  Neurological:     General: No focal deficit present.     Mental Status: She is alert and oriented to person, place, and time.  Psychiatric:        Mood and Affect: Mood is anxious.   Data Reviewed:  There are no new results to review at this time.  Vent. rate 96 BPM PR interval 136 ms QRS duration 104 ms QT/QTcB 375/474 ms P-R-T axes 74 79 33 Sinus rhythm Ventricular premature complex  Assessment and Plan: Principal Problem:   Acute on chronic respiratory failure with hypercapnia (HCC) Secondary to:   COPD exacerbation (HCC) Observation/telemetry Continue supplemental oxygen. BiPAP as needed and at bedtime. Methylprednisolone 125 mg IVP x1 given by EMS. Will followed with prednisone 40 mg p.o. daily in a.m. Scheduled and as needed bronchodilators. Follow-up CBC and chemistry in the morning.   Active Problems:   Restless legs syndrome (RLS) Continue ropinirole 2 mg p.o. bedtime.    Hypothyroidism Continue levothyroxine 137 mcg p.o. daily.    Schizoaffective disorder, depressive type (HCC) Continue clonazepam 0.5 mg p.o. twice daily. Continue hydroxyzine 25 mg p.o. 3 times daily. Continue Remeron 15 mg p.o. bedtime. Continue sertraline 100 mg p.o. daily.    Essential hypertension Continue furosemide 20 mg p.o. daily. KCl 20 mill equivalents p.o. twice daily. Continue lisinopril 5 mg p.o. daily. Monitor BP, renal function and electrolytes.    Tobacco abuse Tobacco cessation advised. Continue nicotine replacement therapy.    GERD (gastroesophageal reflux disease) Continue pantoprazole 40 mg p.o. daily.    Normocytic anemia Monitor hematocrit and hemoglobin.    Abnormal LFTs Recheck LFTs in AM.   Advance Care Planning:   Code Status: Full Code   Consults:   Family Communication:   Severity of Illness: The appropriate patient status for this patient is OBSERVATION. Observation status is judged to be reasonable and necessary in order to  provide the required intensity of service to ensure the patient's safety. The patient's presenting symptoms, physical exam findings, and initial radiographic and laboratory data in the context of their medical condition is  felt to place them at decreased risk for further clinical deterioration. Furthermore, it is anticipated that the patient will be medically stable for discharge from the hospital within 2 midnights of admission.   Author: Reubin Milan, MD 11/16/2022 10:51 AM  For on call review [email or web address].   This document was prepared using Dragon voice recognition software and may contain some unintended transcription errors.

## 2022-11-16 NOTE — Progress Notes (Signed)
Patient refuses nocturnal BiPAP. Equipment remains at bedside should she require its use during the night. She is also aware that should she change her mind and wish to use it nocturnally, as ordered, she can ask for assistance at any time. RT will continue to follow and encourage routine nocturnal use.

## 2022-11-16 NOTE — ED Provider Notes (Signed)
[facility] DEPT Provider Note  CSN: [id number] Arrival date & time: 11/16/22 0840  Chief Complaint(s) Shortness of Breath  HPI Audrey Meyer is a 66 y.o. female with PMH COPD on home 3 to 4 L nasal cannula, HTN, HLD, schizoaffective disorder, hypothyroidism who presents emergency department for evaluation of shortness of breath.  Patient recently seen on 11/12/2022 for COPD exacerbation and ultimately improved with ER interventions and was sent home.  She states that she is still taking her steroids but did not take it this morning as when she stepped outside to smoke a cigarette, she felt acute worsening of her shortness of breath and called EMS.  Patient does voice understanding of the dangers of smoking cigarettes with oxygen cannula on face.  EMS administered 2 g of magnesium, 1 mg of Atrovent, 2 albuterol treatments and 125 of methylprednisolone prior to arrival.  Patient arrives on CPAP alert and oriented answering all questions appropriately.   Past Medical History Past Medical History:  Diagnosis Date   Acid reflux    Breast cancer ([place])    COPD (chronic obstructive pulmonary disease) ([place])    Drug overdose 04/24/2017   High cholesterol    Hypertension    Hyponatremia 01/31/2017   Hypothyroid    Personal history of radiation therapy    Schizoaffective disorder    Suicide attempt ([place]) 04/25/2017   Patient Active Problem List   Diagnosis Date Noted   Acute on chronic respiratory failure with hypercapnia ([place]) 12/07/2021   Hypocalcemia 12/07/2021   Normocytic anemia 12/07/2021   GERD (gastroesophageal reflux disease) 11/25/2021   On supplemental oxygen therapy 11/25/2021   Nicotine dependence 11/25/2021   COPD with acute exacerbation ([place]) 11/25/2021   Chronic respiratory failure with hypoxia, on home O2 therapy ([place]) - 4 L/min 11/25/2021   COPD exacerbation ([place]) 11/25/2021   Chronic respiratory failure with hypercapnia ([place]) 11/25/2021    Obesity (BMI 30-39.9) 11/25/2021   Major depressive disorder, recurrent episode, moderate ([place]) 04/28/2016   Schizophrenia, 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 Authorizing Provider  albuterol (VENTOLIN HFA) 108 (90 Base) MCG/ACT inhaler Inhale 2 puffs into the lungs every 4 (four) hours as needed for wheezing or shortness of breath. 11/16/21   Orpah Greek, MD  amLODipine (NORVASC) 10 MG tablet Take 10 mg by mouth daily. 04/17/17   [provider]  amoxicillin-clavulanate (AUGMENTIN) 875-125 MG tablet Take 1 tablet by mouth every 12 (twelve) hours. 11/12/22   Cristie Hem, MD  atorvastatin (LIPITOR) 20 MG tablet Take 1 tablet (20 mg total) by mouth at bedtime. 05/05/16   [place]row, Elyse Jarvis, FNP  clonazePAM (KLONOPIN) 0.5 MG tablet Take 0.5 mg by mouth 2 (two) times daily. 11/20/21   [provider]  cyclobenzaprine (FLEXERIL) 10 MG tablet Take 10 mg by mouth 3 (three) times daily as needed for muscle spasms. 07/21/21   [provider]  dextromethorphan-guaiFENesin (MUCINEX DM) 30-600 MG 12hr tablet Take 1 tablet by mouth 2 (two) times daily.    [provider]  diclofenac (VOLTAREN) 75 MG EC tablet Take 75 mg by mouth in the morning and at bedtime. 10/15/21   [provider]  fluticasone-salmeterol (ADVAIR HFA) 474-25 MCG/ACT inhaler Inhale  2 puffs into the lungs in the morning and at bedtime. 09/28/21   [provider]  furosemide (LASIX) 20 MG tablet Take 20 mg by mouth daily. 05/01/17   [provider]  gabapentin (NEURONTIN) 600 MG tablet Take 600 mg by mouth 3 (three) times daily.    [provider]   hydrOXYzine (VISTARIL) 25 MG capsule Take 25 mg by mouth 3 (three) times daily. 05/05/17   [provider]  ipratropium (ATROVENT) 0.02 % nebulizer solution Take 2.5 mLs (0.5 mg total) by nebulization every 6 (six) hours as needed for wheezing or shortness of breath. 11/06/22   Milton Ferguson, MD  levalbuterol Penne Lash) 0.63 MG/3ML nebulizer solution Take 3 mLs by nebulization 4 (four) times daily as needed for wheezing or shortness of breath. 11/13/21   [provider]  levothyroxine (SYNTHROID) 137 MCG tablet Take 137 mcg by mouth daily before breakfast.    [provider]  mirtazapine (REMERON) 15 MG tablet Take 15 mg by mouth at bedtime. 05/05/17   [provider]  Multiple Vitamin (MULTIVITAMIN WITH MINERALS) TABS tablet Take 1 tablet by mouth daily.    [provider]  mupirocin cream (BACTROBAN) 2 % Apply 1 Application topically 2 (two) times daily. 11/06/22   Wyvonnia Dusky, MD  paliperidone (INVEGA) 3 MG 24 hr tablet Take 3 mg by mouth every morning. 10/29/21   [provider]  pantoprazole (PROTONIX) 40 MG tablet Take 40 mg by mouth daily. 09/28/21   [provider]  potassium chloride SA (K-DUR,KLOR-CON) 20 MEQ tablet Take 20 mEq by mouth 2 (two) times daily.     [provider]  predniSONE (DELTASONE) 50 MG tablet Take 1 tablet (50 mg total) by mouth daily for 5 days. 11/12/22 11/17/22  Cristie Hem, MD  rOPINIRole (REQUIP) 1 MG tablet Take 2 mg by mouth at bedtime.    [provider]  sertraline (ZOLOFT) 100 MG tablet Take 100 mg by mouth daily. 05/05/17   [provider]                                                                                                                                    Past Surgical History Past Surgical History:  Procedure Laterality Date   APPENDECTOMY     BREAST LUMPECTOMY Left 2004   TUBAL LIGATION     Family History Family History  Problem Relation Age  of Onset   Heart attack Father    Hypertension Father    Cancer Other     Social History Social History   Tobacco Use   Smoking status: Some Days    Packs/day: 0.00    Years: 2.00    Total pack years: 0.00    Types: Cigars, Cigarettes   Smokeless tobacco: Never  Vaping Use   Vaping Use: Never used  Substance Use Topics   Alcohol use: No   Drug use:  Yes    Types: Marijuana   Allergies Budesonide, Promethazine, Lisinopril, and Lithium  Review of Systems Review of Systems  Respiratory:  Positive for cough, chest tightness, shortness of breath and wheezing.     Physical Exam Vital Signs  I have reviewed the triage vital signs BP 129/75 (BP Location: Right Arm)   Pulse 95   Temp (!) 97.3 F (36.3 C) (Axillary)   Resp 17   Ht '5\' 2"'$  (1.575 m)   Wt 80.3 kg   SpO2 100%   BMI 32.37 kg/m   Physical Exam Vitals and nursing note reviewed.  Constitutional:      General: She is not in acute distress.    Appearance: She is well-developed.  HENT:     Head: Normocephalic and atraumatic.  Eyes:     Conjunctiva/sclera: Conjunctivae normal.  Cardiovascular:     Rate and Rhythm: Normal rate and regular rhythm.     Heart sounds: No murmur heard. Pulmonary:     Effort: Tachypnea, accessory muscle usage and respiratory distress present.     Breath sounds: Wheezing present.  Abdominal:     Palpations: Abdomen is soft.     Tenderness: There is no abdominal tenderness.  Musculoskeletal:        General: No swelling.     Cervical back: Neck supple.  Skin:    General: Skin is warm and dry.     Capillary Refill: Capillary refill takes less than 2 seconds.  Neurological:     Mental Status: She is alert.  Psychiatric:        Mood and Affect: Mood normal.     ED Results and Treatments Labs (all labs ordered are listed, but only abnormal results are displayed) Labs Reviewed  CBG MONITORING, ED - Abnormal; Notable for the following components:      Result Value    Glucose-Capillary 184 (*)    All other components within normal limits  COMPREHENSIVE METABOLIC PANEL  CBC WITH DIFFERENTIAL/PLATELET  BLOOD GAS, VENOUS  TROPONIN I (HIGH SENSITIVITY)                                                                                                                          Radiology No results found.  Pertinent labs & imaging results that were available during my care of the patient were reviewed by me and considered in my medical decision making (see MDM for details).  Medications Ordered in ED Medications  ipratropium (ATROVENT) nebulizer solution 0.5 mg (0.5 mg Nebulization Given 11/16/22 0853)  albuterol (PROVENTIL) (2.5 MG/3ML) 0.083% nebulizer solution 10 mg (10 mg Nebulization Given 11/16/22 0853)  Procedures .Critical Care  Performed by: Teressa Lower, MD Authorized by: Teressa Lower, MD   Critical care provider statement:    Critical care time (minutes):  30   Critical care was necessary to treat or prevent imminent or life-threatening deterioration of the following conditions:  Respiratory failure   Critical care was time spent personally by me on the following activities:  Development of treatment plan with patient or surrogate, discussions with consultants, evaluation of patient's response to treatment, examination of patient, ordering and review of laboratory studies, ordering and review of radiographic studies, ordering and performing treatments and interventions, pulse oximetry, re-evaluation of patient's condition and review of old charts   (including critical care time)  Medical Decision Making / ED Course   This patient presents to the ED for concern of shortness of breath, wheezing, this involves an extensive number of treatment options, and is a complaint that carries with it a high risk of  complications and morbidity.  The differential diagnosis includes COPD exacerbation, pneumonia, pneumothorax, ACS, PE  MDM: Patient seen the emergency room for evaluation of shortness of breath and wheezing.  Physical exam reveals an ill-appearing patient with respiratory distress, accessory muscle use and expiratory wheezing bilaterally.  Diminished lung sounds on the left.  Laboratory evaluation with leukocytosis to 13.3 likely secondary to her persistent steroid use, hemoglobin 10.5, CO2 34, pH 7.38 with a pCO2 of 73 and a bicarb of 43.2 representing likely chronic hypercarbia.  Chest x-ray with emphysema but no acute findings.  Patient is requiring BiPAP here in the emergency department for accessory muscle use and received an hour-long albuterol treatment with an additional Atrovent treatment.  On reevaluation, patient continuing to wheeze and will require hospital admission for q4 albuterol therapy and resolution of shortness of breath.  Patient admitted.   Additional history obtained:  -External records from outside source obtained and reviewed including: Chart review including previous notes, labs, imaging, consultation notes   Lab Tests: -I ordered, reviewed, and interpreted labs.   The pertinent results include:   Labs Reviewed  CBG MONITORING, ED - Abnormal; Notable for the following components:      Result Value   Glucose-Capillary 184 (*)    All other components within normal limits  COMPREHENSIVE METABOLIC PANEL  CBC WITH DIFFERENTIAL/PLATELET  BLOOD GAS, VENOUS  TROPONIN I (HIGH SENSITIVITY)      EKG   EKG Interpretation  Date/Time:    Ventricular Rate:    PR Interval:    QRS Duration:   QT Interval:    QTC Calculation:   R Axis:     Text Interpretation:           Imaging Studies ordered: I ordered imaging studies including CXR I independently visualized and interpreted imaging. I agree with the radiologist interpretation   Medicines ordered and  prescription drug management: Meds ordered this encounter  Medications   DISCONTD: albuterol (PROVENTIL,VENTOLIN) solution continuous neb   ipratropium (ATROVENT) nebulizer solution 0.5 mg   albuterol (PROVENTIL) (2.5 MG/3ML) 0.083% nebulizer solution 10 mg    -I have reviewed the patients home medicines and have made adjustments as needed  Critical interventions BiPAP, albuterol, Atrovent, oxygen supplementation    Cardiac Monitoring: The patient was maintained on a cardiac monitor.  I personally viewed and interpreted the cardiac monitored which showed an underlying rhythm of: NSR  Social Determinants of Health:  Factors impacting patients care include: We had a long discussion about patient's cigarette use and cessation encouraged especially when wearing  nasal cannula   Reevaluation: After the interventions noted above, I reevaluated the patient and found that they have :improved  Co morbidities that complicate the patient evaluation  Past Medical History:  Diagnosis Date   Acid reflux    Breast cancer ([place])    COPD (chronic obstructive pulmonary disease) ([place])    Drug overdose 04/24/2017   High cholesterol    Hypertension    Hyponatremia 01/31/2017   Hypothyroid    Personal history of radiation therapy    Schizoaffective disorder    Suicide attempt ([place]) 04/25/2017      Dispostion: I considered admission for this patient, and due to persistent wheezing and BiPAP requirement, patient require hospital admission     Final Clinical Impression(s) / ED Diagnoses Final diagnoses:  None     '@PCDICTATION'$ @    Teressa Lower, MD 11/16/22 1026

## 2022-11-16 NOTE — Progress Notes (Signed)
RT NOTE:  Pt requested to come off BiPAP at this time. No distress noted at this time RR 17-20. Pt placed on home regimen O2 of 4L with saturations of 96-97%. Pt states her breathing feels much better now. RT will continue to monitor pt status.

## 2022-11-16 NOTE — Progress Notes (Signed)
RT NOTE:  Pt placed on BiPAP with cont nebulizer running in-line per EDP order.

## 2022-11-16 NOTE — ED Triage Notes (Signed)
Pt to ED via  EMS from home. Pt went outside to smoke and had a COPD exacerbation. States she wears 3-4L of O2 at home. EMS gave pt 10 albuterol, 1 atrovent, 2 mag, '125mg'$  solumedrol and started her on CPAP.

## 2022-11-17 ENCOUNTER — Observation Stay (HOSPITAL_BASED_OUTPATIENT_CLINIC_OR_DEPARTMENT_OTHER): Payer: Medicare PPO

## 2022-11-17 DIAGNOSIS — J9622 Acute and chronic respiratory failure with hypercapnia: Secondary | ICD-10-CM | POA: Diagnosis not present

## 2022-11-17 DIAGNOSIS — R9431 Abnormal electrocardiogram [ECG] [EKG]: Secondary | ICD-10-CM

## 2022-11-17 LAB — COMPREHENSIVE METABOLIC PANEL
ALT: 40 U/L (ref 0–44)
AST: 15 U/L (ref 15–41)
Albumin: 3.3 g/dL — ABNORMAL LOW (ref 3.5–5.0)
Alkaline Phosphatase: 48 U/L (ref 38–126)
Anion gap: 6 (ref 5–15)
BUN: 26 mg/dL — ABNORMAL HIGH (ref 8–23)
CO2: 35 mmol/L — ABNORMAL HIGH (ref 22–32)
Calcium: 8.9 mg/dL (ref 8.9–10.3)
Chloride: 98 mmol/L (ref 98–111)
Creatinine, Ser: 0.7 mg/dL (ref 0.44–1.00)
GFR, Estimated: >60 mL/min (ref >60)
Glucose, Bld: 148 mg/dL — ABNORMAL HIGH (ref 70–99)
Potassium: 3.8 mmol/L (ref 3.5–5.1)
Sodium: 139 mmol/L (ref 135–145)
Total Bilirubin: 0.5 mg/dL (ref 0.3–1.2)
Total Protein: 5.7 g/dL — ABNORMAL LOW (ref 6.5–8.1)

## 2022-11-17 LAB — ECHOCARDIOGRAM COMPLETE
AR max vel: 3.32 cm2
AV Area VTI: 3.36 cm2
AV Area mean vel: 3.14 cm2
AV Mean grad: 3 mmHg
AV Peak grad: 5.6 mmHg
Ao pk vel: 1.18 m/s
Area-P 1/2: 3.85 cm2
Calc EF: 56 %
Height: 62 in
MV M vel: 2.75 m/s
MV Peak grad: 30.1 mmHg
P 1/2 time: 550 msec
S' Lateral: 2.5 cm
Single Plane A2C EF: 54 %
Single Plane A4C EF: 58.9 %
Weight: 2832 oz

## 2022-11-17 LAB — CBC
HCT: 33 % — ABNORMAL LOW (ref 36.0–46.0)
Hemoglobin: 10.7 g/dL — ABNORMAL LOW (ref 12.0–15.0)
MCH: 30.3 pg (ref 26.0–34.0)
MCHC: 32.4 g/dL (ref 30.0–36.0)
MCV: 93.5 fL (ref 80.0–100.0)
Platelets: 235 10*3/uL (ref 150–400)
RBC: 3.53 MIL/uL — ABNORMAL LOW (ref 3.87–5.11)
RDW: 13.2 % (ref 11.5–15.5)
WBC: 13.5 10*3/uL — ABNORMAL HIGH (ref 4.0–10.5)
nRBC: 0.2 % (ref 0.0–0.2)

## 2022-11-17 LAB — RESPIRATORY PANEL BY PCR

## 2022-11-17 MED ORDER — ADULT MULTIVITAMIN W/MINERALS CH
1.0000 | ORAL_TABLET | Freq: Every day | ORAL | Status: DC
  Administered 2022-11-17 – 2022-11-18 (×2): 1 via ORAL
  Filled 2022-11-17 (×2): qty 1

## 2022-11-17 MED ORDER — FUROSEMIDE 20 MG PO TABS
20.0000 mg | ORAL_TABLET | Freq: Once | ORAL | Status: AC
  Administered 2022-11-17: 20 mg via ORAL
  Filled 2022-11-17: qty 1

## 2022-11-17 MED ORDER — SALINE SPRAY 0.65 % NA SOLN
1.0000 | NASAL | Status: DC | PRN
  Administered 2022-11-18: 1 via NASAL
  Filled 2022-11-17: qty 44

## 2022-11-17 MED ORDER — IPRATROPIUM-ALBUTEROL 0.5-2.5 (3) MG/3ML IN SOLN
3.0000 mL | RESPIRATORY_TRACT | Status: DC
  Administered 2022-11-17 – 2022-11-18 (×6): 3 mL via RESPIRATORY_TRACT
  Filled 2022-11-17 (×7): qty 3

## 2022-11-17 MED ORDER — FUROSEMIDE 40 MG PO TABS
40.0000 mg | ORAL_TABLET | Freq: Every day | ORAL | Status: DC
  Administered 2022-11-18: 40 mg via ORAL
  Filled 2022-11-17: qty 1

## 2022-11-17 MED ORDER — ENSURE MAX PROTEIN PO LIQD
11.0000 [oz_av] | Freq: Every day | ORAL | Status: DC
  Administered 2022-11-17: 11 [oz_av] via ORAL
  Filled 2022-11-17 (×2): qty 330

## 2022-11-17 MED ORDER — NICOTINE 21 MG/24HR TD PT24
21.0000 mg | MEDICATED_PATCH | Freq: Every day | TRANSDERMAL | Status: DC
  Administered 2022-11-17 – 2022-11-18 (×2): 21 mg via TRANSDERMAL
  Filled 2022-11-17 (×2): qty 1

## 2022-11-17 NOTE — Progress Notes (Signed)
Patient continues to decline nocturnal BiPAP. However, she is not opposed to use on a prn basis.

## 2022-11-17 NOTE — Progress Notes (Signed)
  Echocardiogram 2D Echocardiogram has been performed.  Audrey Meyer 11/17/2022, 8:45 AM

## 2022-11-17 NOTE — Hospital Course (Signed)
PMH of breast cancer SP radiation, schizoaffective disorder, HLD, HTN, hypothyroidism, COPD and chronic respiratory failure on 4 LPM at home, mood disorder with suicide attempt history, GERD presented to hospital complains of shortness of breath.

## 2022-11-17 NOTE — Evaluation (Signed)
Occupational Therapy Evaluation Patient Details Name: Audrey Meyer MRN: [id number] DOB: 13-Mar-1956 Today's Date: 11/17/2022   History of Present Illness 66 y.o. female with medical history significant of GERD, breast cancer, history of radiation therapy, drug overdose, suicide attempt, schizoaffective disorder, hyperlipidemia, hypertension, hyponatremia, hypothyroidism, COPD with chronic respiratory failure on home oxygen who has presented for the third time in the last 10 days to the emergency department due to dyspnea.  Pt admitted 11/16/22 for Acute on chronic respiratory failure with hypercapnia due to COPD exacerbation   Clinical Impression   Audrey Meyer uses 4L O2 at all times at her baseline. She performed all assessed tasks today without the need for assistance, including supine to sit, lower body dressing, sit to stand, and toileting at bathroom level. She appears to be at or very near to her baseline level of functioning for self-care management, to which she is in agreement. She does not require further OT services, therefore OT will sign off.       Recommendations for follow up therapy are one component of a multi-disciplinary discharge planning process, led by the attending physician.  Recommendations may be updated based on patient status, additional functional criteria and insurance authorization.   Follow Up Recommendations  No OT follow up     Assistance Recommended at Discharge PRN     Functional Status Assessment  Patient has not had a recent decline in their functional status  Equipment Recommendations  None recommended by OT       Precautions / Restrictions Precautions Precautions: Other (comment) Precaution Comments: baseline 4L O2  Restrictions Weight Bearing Restrictions: No      Mobility Bed Mobility Overal bed mobility: Modified Independent                  Transfers  Equipment used: None Transfers: Sit to/from Stand Sit to Stand:  Independent                  Balance Overall balance assessment: No apparent balance deficits (not formally assessed)       ADL either performed or assessed with clinical judgement   ADL Overall ADL's : At baseline           Vision Patient Visual Report: No change from baseline              Pertinent Vitals/Pain Pain Assessment Pain Assessment: No/denies pain     Hand Dominance Right   Extremity/Trunk Assessment Upper Extremity Assessment Upper Extremity Assessment: Overall WFL for tasks assessed (BUE AROM and strength WFL)   Lower Extremity Assessment Lower Extremity Assessment: Overall WFL for tasks assessed (BLE AROM and strength WFL)      Communication Communication Communication: No difficulties   Cognition Arousal/Alertness: Awake/alert Behavior During Therapy: WFL for tasks assessed/performed Overall Cognitive Status: Within Functional Limits for tasks assessed        General Comments: Oriented x4, able to follow commands without difficulty                Home Living Family/patient expects to be discharged to:: Private residence Living Arrangements: Alone  Type of Home: Apartment Home Access:  (ground floor apartment)    Home Layout: One level     Bathroom Shower/Tub: Tub/shower unit         Home Equipment: Cane - single point          Prior Functioning/Environment Prior Level of Function : Independent/Modified Independent  Mobility Comments: independent, on 4L O2 [place] baseline ADLs Comments:  (She was modified independent to independent with ADLs, cooking, and cleaning. She takes spongbaths for bathing.)              OT Treatment/Interventions:   No further OT treatment needs identified    OT Goals(Current goals can be found in the care plan section)    OT Frequency:  N/A       AM-PAC OT "6 Clicks" Daily Activity     Outcome Measure Help from another person eating meals?: None Help from another  person taking care of personal grooming?: None Help from another person toileting, which includes using toliet, bedpan, or urinal?: None Help from another person bathing (including washing, rinsing, drying)?: None Help from another person to put on and taking off regular upper body clothing?: None Help from another person to put on and taking off regular lower body clothing?: None 6 Click Score: 24   End of Session Nurse Communication: Mobility status  Activity Tolerance: Patient tolerated treatment well Patient left: in bed;with call bell/phone within reach  OT Visit Diagnosis: Muscle weakness (generalized) (M62.81)                Time: 1610-9604 OT Time Calculation (min): 19 min Charges:  OT General Charges $OT Visit: 1 Visit OT Evaluation $OT Eval Low Complexity: 1 Low    Delanna Blacketer L Navreet Bolda, OTR/L 11/17/2022, 1:52 PM

## 2022-11-17 NOTE — Progress Notes (Signed)
Initial Nutrition Assessment  INTERVENTION:   -Ensure MAX Protein po daily, each supplement provides 150 kcal and 30 grams of protein   -Multivitamin with minerals daily  NUTRITION DIAGNOSIS:   Increased nutrient needs related to chronic illness (COPD) as evidenced by estimated needs.  GOAL:   Patient will meet greater than or equal to 90% of their needs  MONITOR:   PO intake, Supplement acceptance, Labs, Weight trends, I & O's  REASON FOR ASSESSMENT:   Consult COPD Protocol, Assessment of nutrition requirement/status  ASSESSMENT:   66 y.o. female with medical history significant of GERD, breast cancer, history of radiation therapy, drug overdose, suicide attempt, schizoaffective disorder, hyperlipidemia, hypertension, hyponatremia, hypothyroidism, COPD with chronic respiratory failure on home oxygen who has presented for the third time in the last 10 days to the emergency department due to dyspnea.  Patient with no reports of decreased appetite or poor PO PTA. Per weight records, pt 's weight has been increasing. Pt was having SOB for 5 days PTA. Increased needs d/t COPD. Will order Ensure Max daily with daily MVI.  Medications: Lasix, Prednisone, Remeron, Miralax, KLOR-CON  Labs reviewed: CBGs: 184   NUTRITION - FOCUSED PHYSICAL EXAM:  Unable to complete, working remotely.   Diet Order:   Diet Order             Diet Heart Room service appropriate? Yes; Fluid consistency: Thin  Diet effective now                   EDUCATION NEEDS:   No education needs have been identified at this time  Skin:  Skin Assessment: Reviewed RN Assessment  Last BM:  PTA  Height:   Ht Readings from Last 1 Encounters:  11/16/22 '5\' 2"'$  (1.575 m)    Weight:   Wt Readings from Last 1 Encounters:  11/16/22 80.3 kg    BMI:  Body mass index is 32.37 kg/m.  Estimated Nutritional Needs:   Kcal:  1500-1700  Protein:  75-90g  Fluid:  1.7L/day  Clayton Bibles, MS, RD,  LDN Inpatient Clinical Dietitian Contact information available via Amion

## 2022-11-17 NOTE — Progress Notes (Signed)
[facility] Progress Note Patient: WINSLOW VERRILL ZOX:[id number] DOB: [birth year] DOA: 11/16/2022  DOS: the patient was seen and examined on 11/17/2022  Brief hospital course: PMH of breast cancer SP radiation, schizoaffective disorder, HLD, HTN, hypothyroidism, COPD and chronic respiratory failure on 4 LPM at home, mood disorder with suicide attempt history, GERD presented to hospital complains of shortness of breath. Assessment and Plan: Acute on chronic respiratory failure with hypercapnia (HCC) COPD exacerbation (HCC) Continue supplemental oxygen. Does not require BiPAP last night. Continue Solu-Medrol and prednisone combination. Continue nebulizer therapy. COVID, influenza, respiratory virus pathogen panel negative.   Restless legs syndrome (RLS) Continue ropinirole 2 mg p.o. bedtime.   Hypothyroidism Continue levothyroxine 137 mcg p.o. daily.   Schizoaffective disorder, depressive type (HCC) Continue clonazepam 0.5 mg p.o. twice daily. Continue hydroxyzine 25 mg p.o. 3 times daily. Continue Remeron 15 mg p.o. bedtime. Continue sertraline 100 mg p.o. daily.   Essential hypertension Continue furosemide 20 mg p.o. daily. KCl 20 mill equivalents p.o. twice daily. Continue lisinopril 5 mg p.o. daily. Monitor BP, renal function and electrolytes.   Tobacco abuse Tobacco cessation advised. Continue nicotine replacement therapy.   GERD (gastroesophageal reflux disease) Continue pantoprazole 40 mg p.o. daily.   Normocytic anemia Monitor hematocrit and hemoglobin.   Abnormal LFTs Monitor for now.  Obesity Body mass index is 32.37 kg/m.  Placing the pt at higher risk of poor outcomes.   Subjective: No nausea no vomiting no fever no chills.  Continues to have shortness of breath continues to have fatigue.  Breathing improving compared to yesterday.  Not back to baseline.  Physical Exam: General: in mild distress;  Cardiovascular: S1 and S2 Present, no  Murmur Respiratory: Increased respiratory effort, Bilateral Air entry present, faint crackles, bilateral expiratory wheezes Abdomen: Bowel Sound present, Non tender  Extremities: no edema Neurology: alert and oriented to time, place, and person   Data Reviewed: I have Reviewed nursing notes, Vitals, and Lab results. Since last encounter, pertinent lab results CBC and BMP   . I have ordered test including CBC and BMP  .   Disposition: Status is: Observation  SCDs Start: 11/16/22 1049   Family Communication: No one at bedside Level of care: Progressive Continue progressive for now due to respiratory distress.  Potentially home tomorrow Vitals:   11/17/22 0856 11/17/22 1204 11/17/22 1303 11/17/22 1547  BP: (!) 153/76  (!) 155/70   Pulse: 90  (!) 103   Resp: '20 17 20   '$ Temp: 97.7 F (36.5 C)  98.4 F (36.9 C)   TempSrc: Oral  Oral   SpO2: 100% 100% 94% 96%  Weight:      Height:         Author: Berle Mull, MD 11/17/2022 7:21 PM  Please look on [email or web address] to find out who is on call.

## 2022-11-17 NOTE — Evaluation (Signed)
Physical Therapy One Time Evaluation Patient Details Name: Audrey Meyer MRN: [id number] DOB: [birth year] Today's Date: 11/17/2022  History of Present Illness  66 y.o. female with medical history significant of GERD, breast cancer, history of radiation therapy, drug overdose, suicide attempt, schizoaffective disorder, hyperlipidemia, hypertension, hyponatremia, hypothyroidism, COPD with chronic respiratory failure on home oxygen who has presented for the third time in the last 10 days to the emergency department due to dyspnea.  Pt admitted 11/16/22 for Acute on chronic respiratory failure with hypercapnia due to COPD exacerbation  Clinical Impression  Patient evaluated by Physical Therapy with no further acute PT needs identified. All education has been completed and the patient has no further questions.  See below for any follow-up Physical Therapy or equipment needs. PT is signing off. Thank you for this referral.        Recommendations for follow up therapy are one component of a multi-disciplinary discharge planning process, led by the attending physician.  Recommendations may be updated based on patient status, additional functional criteria and insurance authorization.  Follow Up Recommendations No PT follow up      Assistance Recommended at Discharge PRN  Patient can return home with the following  Assistance with cooking/housework    Equipment Recommendations None recommended by PT  Recommendations for Other Services       Functional Status Assessment Patient has not had a recent decline in their functional status     Precautions / Restrictions Precautions Precautions: Other (comment) Precaution Comments: baseline 4L O2 [place] Restrictions Weight Bearing Restrictions: No      Mobility  Bed Mobility Overal bed mobility: Modified Independent                  Transfers Overall transfer level: Needs assistance Equipment used: None Transfers: Sit to/from  Stand Sit to Stand: Supervision                Ambulation/Gait Ambulation/Gait assistance: Supervision Gait Distance (Feet): 200 Feet Assistive device: None Gait Pattern/deviations: Step-through pattern, Decreased stride length Gait velocity: decr     General Gait Details: decreased trunk and arm movement, pt feels at baseline, ambulated on 4L O2 [place] and SPO2 97% at halfway point and then upon returning to room  Stairs            Wheelchair Mobility    Modified [place] (Stroke Patients Only)       Balance Overall balance assessment: No apparent balance deficits (not formally assessed) (denies any recent falls)                                           Pertinent Vitals/Pain Pain Assessment Pain Assessment: No/denies pain    Home Living Family/patient expects to be discharged to:: Private residence Living Arrangements: Alone Available Help at Discharge: Family;Available PRN/intermittently Type of Home: Apartment Home Access: Stairs to enter   Entrance Stairs-Number of Steps: 1   Home Layout: One level Home Equipment: Cane - single point      Prior Function Prior Level of Function : Independent/Modified Independent             Mobility Comments: independent, on 4L O2 [place] baseline ADLs Comments: family helps with errands     Hand Dominance        Extremity/Trunk Assessment        Lower Extremity Assessment Lower Extremity Assessment: Overall [facility]  for tasks assessed    Cervical / Trunk Assessment Cervical / Trunk Assessment: Normal  Communication   Communication: No difficulties  Cognition Arousal/Alertness: Awake/alert Behavior During Therapy: WFL for tasks assessed/performed Overall Cognitive Status: Within Functional Limits for tasks assessed                                          General Comments      Exercises     Assessment/Plan    PT Assessment Patient does not need any further PT  services  PT Problem List         PT Treatment Interventions      PT Goals (Current goals can be found in the Care Plan section)  Acute Rehab PT Goals PT Goal Formulation: All assessment and education complete, DC therapy    Frequency       Co-evaluation               AM-PAC PT "6 Clicks" Mobility  Outcome Measure Help needed turning from your back to your side while in a flat bed without using bedrails?: None Help needed moving from lying on your back to sitting on the side of a flat bed without using bedrails?: None Help needed moving to and from a bed to a chair (including a wheelchair)?: None Help needed standing up from a chair using your arms (e.g., wheelchair or bedside chair)?: None Help needed to walk in hospital room?: A Little Help needed climbing 3-5 steps with a railing? : A Little 6 Click Score: 22    End of Session Equipment Utilized During Treatment: Oxygen Activity Tolerance: Patient tolerated treatment well Patient left: in bed;with call bell/phone within reach Nurse Communication: Mobility status PT Visit Diagnosis: Difficulty in walking, not elsewhere classified (R26.2)    Time: 1004-1016 PT Time Calculation (min) (ACUTE ONLY): 12 min   Charges:   PT Evaluation $PT Eval Low Complexity: 1 Low        Kati PT, DPT Physical Therapist Acute Rehabilitation Services Preferred contact method: Secure Chat Weekend Pager Only: [phone] Office: [place] 11/17/2022, 11:07 AM

## 2022-11-17 NOTE — TOC Initial Note (Signed)
Transition of Care [facility]) - Initial/Assessment Note    Patient Details  Name: Audrey Meyer MRN: [id number] Date of Birth: 02-25-1956  Transition of Care [facility]) CM/SW Contact:    Leeroy Cha, RN Phone Number: 11/17/2022, 7:28 AM  Clinical Narrative:                 Information concerning smoking cessation and marijuana use added to the dc instructions.  Expected Discharge Plan: Home/Self Care Barriers to Discharge: Continued Medical Work up   Patient Goals and CMS Choice Patient states their goals for this hospitalization and ongoing recovery are:: to go back home CMS Medicare.gov Compare Post Acute Care list provided to:: Patient    Expected Discharge Plan and Services Expected Discharge Plan: Home/Self Care   Discharge Planning Services: CM Consult   Living arrangements for the past 2 months: Single Family Home                                      Prior Living Arrangements/Services Living arrangements for the past 2 months: Single Family Home Lives with:: Self Patient language and need for interpreter reviewed:: Yes Do you feel safe going back to the place where you live?: Yes            Criminal Activity/Legal Involvement Pertinent to Current Situation/Hospitalization: No - Comment as needed  Activities of Daily Living Home Assistive Devices/Equipment: Oxygen ADL Screening (condition at time of admission) Patient's cognitive ability adequate to safely complete daily activities?: Yes Is the patient deaf or have difficulty hearing?: No Does the patient have difficulty seeing, even when wearing glasses/contacts?: No Does the patient have difficulty concentrating, remembering, or making decisions?: No Patient able to express need for assistance with ADLs?: No Does the patient have difficulty dressing or bathing?: No Independently performs ADLs?: Yes (appropriate for developmental age) Does the patient have difficulty walking or climbing stairs?:  Yes Weakness of Legs: None Weakness of Arms/Hands: None  Permission Sought/Granted                  Emotional Assessment Appearance:: Appears stated age Attitude/Demeanor/Rapport: Engaged Affect (typically observed): Calm Orientation: : Oriented to Self, Oriented to Place, Oriented to  Time, Oriented to Situation Alcohol / Substance Use: Tobacco Use, Illicit Drugs, Not Applicable (current smoker and marijuana use) Psych Involvement: No (comment)  Admission diagnosis:  COPD exacerbation ([place]) [J44.1] Acute on chronic respiratory failure with hypercapnia ([place]) [J96.22] Patient Active Problem List   Diagnosis Date Noted   Abnormal LFTs 11/16/2022   Restless legs syndrome (RLS) 11/16/2022   Acute on chronic respiratory failure with hypercapnia ([place]) 12/07/2021   Hypocalcemia 12/07/2021   Normocytic anemia 12/07/2021   GERD (gastroesophageal reflux disease) 11/25/2021   On supplemental oxygen therapy 11/25/2021   Nicotine dependence 11/25/2021   COPD with acute exacerbation ([place]) 11/25/2021   Chronic respiratory failure with hypoxia, on home O2 therapy ([place]) - 4 L/min 11/25/2021   COPD exacerbation ([place]) 11/25/2021   Chronic respiratory failure with hypercapnia ([place]) 11/25/2021   Obesity (BMI 30-39.9) 11/25/2021   Major depressive disorder, recurrent episode, moderate ([place]) 04/28/2016   Schizophrenia, 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

## 2022-11-18 DIAGNOSIS — I10 Essential (primary) hypertension: Secondary | ICD-10-CM | POA: Diagnosis not present

## 2022-11-18 DIAGNOSIS — Z853 Personal history of malignant neoplasm of breast: Secondary | ICD-10-CM | POA: Diagnosis not present

## 2022-11-18 DIAGNOSIS — J9622 Acute and chronic respiratory failure with hypercapnia: Secondary | ICD-10-CM | POA: Diagnosis not present

## 2022-11-18 DIAGNOSIS — E039 Hypothyroidism, unspecified: Secondary | ICD-10-CM | POA: Diagnosis not present

## 2022-11-18 MED ORDER — LEVALBUTEROL HCL 0.63 MG/3ML IN NEBU
0.6300 mg | INHALATION_SOLUTION | Freq: Four times a day (QID) | RESPIRATORY_TRACT | Status: DC
  Administered 2022-11-18 (×2): 0.63 mg via RESPIRATORY_TRACT
  Filled 2022-11-18 (×2): qty 3

## 2022-11-18 MED ORDER — METOPROLOL TARTRATE 25 MG PO TABS: 12.5000 mg | ORAL_TABLET | Freq: Two times a day (BID) | ORAL | 0 refills | Status: AC

## 2022-11-18 MED ORDER — LEVALBUTEROL HCL 0.63 MG/3ML IN NEBU: 0.6300 mg | INHALATION_SOLUTION | Freq: Four times a day (QID) | RESPIRATORY_TRACT | 0 refills | Status: AC | PRN

## 2022-11-18 MED ORDER — METOPROLOL TARTRATE 25 MG PO TABS
12.5000 mg | ORAL_TABLET | Freq: Two times a day (BID) | ORAL | Status: DC
  Administered 2022-11-18: 12.5 mg via ORAL
  Filled 2022-11-18: qty 1

## 2022-11-18 MED ORDER — IPRATROPIUM BROMIDE 0.02 % IN SOLN: 0.5000 mg | Freq: Four times a day (QID) | RESPIRATORY_TRACT | 0 refills | Status: AC | PRN

## 2022-11-18 MED ORDER — IPRATROPIUM BROMIDE 0.02 % IN SOLN
0.5000 mg | Freq: Four times a day (QID) | RESPIRATORY_TRACT | Status: DC
  Administered 2022-11-18 (×2): 0.5 mg via RESPIRATORY_TRACT
  Filled 2022-11-18 (×2): qty 2.5

## 2022-11-18 MED ORDER — PREDNISONE 10 MG PO TABS: ORAL_TABLET | ORAL | 0 refills | Status: AC

## 2022-11-18 NOTE — Progress Notes (Addendum)
Pt seen and given scheduled nebulizer treatment which she tolerated well.  Pt refused bipap again when offered stating she only wants her breathing treatment.  No increased wob / respiratory distress noted at this time.  Bipap remains in room on standby as needed.

## 2022-11-18 NOTE — Progress Notes (Signed)
SATURATION QUALIFICATIONS: (This note is used to comply with regulatory documentation for home oxygen)  Patient Saturations on Room Air at Rest = 85%  Patient Saturations on Room Air while Ambulating = N/A  Patient Saturations on 4 Liters of oxygen while Ambulating = 97%  Please briefly explain why patient needs home oxygen: Pt requires O2 at all times, resting and ambulating. Pt saturation drops significantly on room air

## 2022-11-18 NOTE — TOC Initial Note (Addendum)
Transition of Care [facility]) - Initial/Assessment Note    Patient Details  Name: Audrey Meyer MRN: [id number] Date of Birth: 14-Apr-1956  Transition of Care [facility]) CM/SW Contact:    Henrietta Dine, RN Phone Number: [phone] 11/18/2022, 10:58 AM  Clinical Narrative:                 [facility] consult for home oxygen; spoke w/ pt in room; the pt says [employer] provides her home oxygen; the pt says she will go to her ex-husband's home today ([place], [address] [address]); she says she will go back to her home on Saturday or Sunday ([place] [address]); the pt says she has transportation and her travel tank is full; the pt says she wears glasses and has dentures (upper and lower); she says she does not have [place] services; the pt says she feels safe going home; notified Caryl Pina at [facility] of pt's d/c plans; he says the agency will contact the pt for information related to what is needed at [facility] location; no TOC needs. Expected Discharge Plan: Home/Self Care Barriers to Discharge: No Barriers Identified   Patient Goals and CMS Choice Patient states their goals for this hospitalization and ongoing recovery are:: home CMS Medicare.gov Compare Post Acute Care list provided to:: Patient    Expected Discharge Plan and Services Expected Discharge Plan: Home/Self Care   Discharge Planning Services: CM Consult   Living arrangements for the past 2 months: Single Family Home Expected Discharge Date: 11/18/22               DME Arranged: Oxygen DME Agency: [employer] Date DME Agency Contacted: 11/18/22 Time DME Agency Contacted: 35 Representative spoke with at DME Agency: Caryl Pina            Prior Living Arrangements/Services Living arrangements for the past 2 months: [place] with:: Self Patient language and need for interpreter reviewed:: Yes Do you feel safe going back to the place where you live?: Yes      Need for Family Participation in  Patient Care: Yes (Comment) Care giver support system in place?: Yes (comment) Current home services: DME (home oxygen) Criminal Activity/Legal Involvement Pertinent to Current Situation/Hospitalization: No - Comment as needed  Activities of Daily Living Home Assistive Devices/Equipment: Oxygen ADL Screening (condition at time of admission) Patient's cognitive ability adequate to safely complete daily activities?: Yes Is the patient deaf or have difficulty hearing?: No Does the patient have difficulty seeing, even when wearing glasses/contacts?: No Does the patient have difficulty concentrating, remembering, or making decisions?: No Patient able to express need for assistance with ADLs?: No Does the patient have difficulty dressing or bathing?: No Independently performs ADLs?: Yes (appropriate for developmental age) Does the patient have difficulty walking or climbing stairs?: Yes Weakness of Legs: None Weakness of Arms/Hands: None  Permission Sought/Granted Permission sought to share information with : Case Manager Permission granted to share information with : Yes, Verbal Permission Granted  Share Information with NAME: Lenor Coffin, RN. CM           Emotional Assessment Appearance:: Appears stated age Attitude/Demeanor/Rapport: Gracious Affect (typically observed): Accepting Orientation: : Oriented to Self, Oriented to Place, Oriented to  Time, Oriented to Situation Alcohol / Substance Use: Not Applicable Psych Involvement: No (comment)  Admission diagnosis:  COPD exacerbation ([place]) [J44.1] Acute on chronic respiratory failure with hypercapnia (HCC) [J96.22] Patient Active Problem List   Diagnosis Date Noted   Abnormal LFTs  11/16/2022   Restless legs syndrome (RLS) 11/16/2022   Acute on chronic respiratory failure with hypercapnia (HCC) 12/07/2021   Hypocalcemia 12/07/2021   Normocytic anemia 12/07/2021   GERD (gastroesophageal reflux disease) 11/25/2021   On  supplemental oxygen therapy 11/25/2021   Nicotine dependence 11/25/2021   COPD with acute exacerbation ([place]) 11/25/2021   Chronic respiratory failure with hypoxia, on home O2 therapy ([place]) - 4 L/min 11/25/2021   COPD exacerbation ([place]) 11/25/2021   Chronic respiratory failure with hypercapnia ([place]) 11/25/2021   Obesity (BMI 30-39.9) 11/25/2021   Major depressive disorder, recurrent episode, moderate ([place]) 04/28/2016   Schizophrenia, 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

## 2022-11-18 NOTE — Progress Notes (Signed)
CCMD called the pt's HR up to 150 and may have atrial tachy. RN checked the pt, pt still denied chest pain. NP notified, EKG performed by NT.

## 2022-11-18 NOTE — Progress Notes (Signed)
Mobility Specialist - Progress Note  Pre-mobility: 105 bpm HR, 94% SpO2 During mobility: 126 bpm HR, 88% SpO2 Post-mobility: 112 bpm HR, 95% SPO2   11/18/22 0929  Oxygen Therapy  O2 Device Nasal Cannula  O2 Flow Rate (L/min) 4 L/min  Mobility  Activity Ambulated independently in hallway  Level of Assistance Standby assist, set-up cues, supervision of patient - no hands on  Assistive Device None  Distance Ambulated (ft) 300 ft  Range of Motion/Exercises Active  Activity Response Tolerated well  Mobility Referral Yes  $Mobility charge 1 Mobility   Pt was found sitting EOB and agreeable to ambulate. Stated feeling SOB during ambulation and upon returning to room encouraged pt to practice pursed lip breathing. Pt stated feeling better after resting for ~1 min and at EOS was left sitting EOB. RN notified of session.  Ferd Hibbs Mobility Specialist

## 2022-11-19 ENCOUNTER — Other Ambulatory Visit: Payer: Self-pay

## 2022-11-19 ENCOUNTER — Emergency Department (HOSPITAL_COMMUNITY)
Admission: EM | Admit: 2022-11-19 | Discharge: 2022-11-19 | Disposition: A | Discharge status: Home / Self Care | Payer: Medicare PPO | Attending: Emergency Medicine | Admitting: Emergency Medicine

## 2022-11-19 ENCOUNTER — Emergency Department (HOSPITAL_COMMUNITY): Payer: Medicare PPO

## 2022-11-19 DIAGNOSIS — Z20822 Contact with and (suspected) exposure to covid-19: Secondary | ICD-10-CM | POA: Diagnosis not present

## 2022-11-19 DIAGNOSIS — Z79899 Other long term (current) drug therapy: Secondary | ICD-10-CM | POA: Diagnosis not present

## 2022-11-19 DIAGNOSIS — J441 Chronic obstructive pulmonary disease with (acute) exacerbation: Secondary | ICD-10-CM | POA: Diagnosis not present

## 2022-11-19 DIAGNOSIS — I10 Essential (primary) hypertension: Secondary | ICD-10-CM | POA: Insufficient documentation

## 2022-11-19 DIAGNOSIS — R0602 Shortness of breath: Secondary | ICD-10-CM | POA: Diagnosis present

## 2022-11-19 LAB — CBC WITH DIFFERENTIAL/PLATELET
Abs Immature Granulocytes: 0.24 10*3/uL — ABNORMAL HIGH (ref 0.00–0.07)
Basophils Absolute: 0 10*3/uL (ref 0.0–0.1)
Basophils Relative: 0 %
Eosinophils Absolute: 0 10*3/uL (ref 0.0–0.5)
Eosinophils Relative: 0 %
HCT: 36.1 % (ref 36.0–46.0)
Hemoglobin: 11.5 g/dL — ABNORMAL LOW (ref 12.0–15.0)
Immature Granulocytes: 2 %
Lymphocytes Relative: 5 %
Lymphs Abs: 0.6 10*3/uL — ABNORMAL LOW (ref 0.7–4.0)
MCH: 30.3 pg (ref 26.0–34.0)
MCHC: 31.9 g/dL (ref 30.0–36.0)
MCV: 95 fL (ref 80.0–100.0)
Monocytes Absolute: 0.3 10*3/uL (ref 0.1–1.0)
Monocytes Relative: 2 %
Neutro Abs: 12.3 10*3/uL — ABNORMAL HIGH (ref 1.7–7.7)
Neutrophils Relative %: 91 %
Platelets: 224 10*3/uL (ref 150–400)
RBC: 3.8 MIL/uL — ABNORMAL LOW (ref 3.87–5.11)
RDW: 13.5 % (ref 11.5–15.5)
WBC: 13.4 10*3/uL — ABNORMAL HIGH (ref 4.0–10.5)
nRBC: 0 % (ref 0.0–0.2)

## 2022-11-19 LAB — TROPONIN I (HIGH SENSITIVITY)
Troponin I (High Sensitivity): 24 ng/L — ABNORMAL HIGH (ref <18)
Troponin I (High Sensitivity): 20 ng/L — ABNORMAL HIGH (ref <18)

## 2022-11-19 LAB — BASIC METABOLIC PANEL
Anion gap: 8 (ref 5–15)
BUN: 32 mg/dL — ABNORMAL HIGH (ref 8–23)
CO2: 36 mmol/L — ABNORMAL HIGH (ref 22–32)
Calcium: 9.2 mg/dL (ref 8.9–10.3)
Chloride: 98 mmol/L (ref 98–111)
Creatinine, Ser: 0.88 mg/dL (ref 0.44–1.00)
GFR, Estimated: >60 mL/min (ref >60)
Glucose, Bld: 264 mg/dL — ABNORMAL HIGH (ref 70–99)
Potassium: 3.7 mmol/L (ref 3.5–5.1)
Sodium: 142 mmol/L (ref 135–145)

## 2022-11-19 LAB — RESP PANEL BY RT-PCR (RSV, FLU A&B, COVID)  RVPGX2
Influenza A by PCR: NEGATIVE
Influenza B by PCR: NEGATIVE
Resp Syncytial Virus by PCR: NEGATIVE
SARS Coronavirus 2 by RT PCR: NEGATIVE

## 2022-11-19 LAB — BLOOD GAS, VENOUS
Acid-Base Excess: 15 mmol/L — ABNORMAL HIGH (ref 0.0–2.0)
Bicarbonate: 43.1 mmol/L — ABNORMAL HIGH (ref 20.0–28.0)
O2 Saturation: 80.8 %
Patient temperature: 36.1
pCO2, Ven: 65 mmHg — ABNORMAL HIGH (ref 44–60)
pH, Ven: 7.42 (ref 7.25–7.43)
pO2, Ven: 42 mmHg (ref 32–45)

## 2022-11-19 LAB — BRAIN NATRIURETIC PEPTIDE: B Natriuretic Peptide: 81 pg/mL (ref 0.0–100.0)

## 2022-11-19 MED ORDER — IPRATROPIUM-ALBUTEROL 0.5-2.5 (3) MG/3ML IN SOLN
3.0000 mL | Freq: Once | RESPIRATORY_TRACT | Status: AC
  Administered 2022-11-19: 3 mL via RESPIRATORY_TRACT
  Filled 2022-11-19: qty 3

## 2022-11-19 MED ORDER — HYDROXYZINE HCL 25 MG PO TABS
25.0000 mg | ORAL_TABLET | Freq: Once | ORAL | Status: AC
  Administered 2022-11-19: 25 mg via ORAL
  Filled 2022-11-19: qty 1

## 2022-11-19 MED ORDER — MAGNESIUM SULFATE 2 GM/50ML IV SOLN: 2.0000 g | Freq: Once | INTRAVENOUS | Status: DC

## 2022-11-19 NOTE — ED Triage Notes (Addendum)
Pt BIBA from home. COPD exacerbation. Was released for same 2x days ago. No relief w/home treatments. Pt on 4L at baseline, has been increasing to 5L since yesterday  Given:  125 solu-medrol .5 atrovent 10 mg albuterol  Aox4  Lung sounds improved by arrival, some expiratory wheezing  Aox4  BP: 176/99 HR: 90 ETCO2: 50

## 2022-11-19 NOTE — ED Provider Notes (Signed)
[facility] DEPT Provider Note   CSN: [id number] Arrival date & time: 11/19/22  1526     History  Chief Complaint  Patient presents with   Shortness of Breath    Audrey Meyer is a 66 y.o. female. With past medical history of COPD on 4LNC at home, hypertension, schizoaffective disorder, suicide attempt who presents to the emergency department with shortness of breath.  Patient states that she had worsening shortness of breath today. States that she felt like she was unable to catch her breath. She has had ongoing symptoms since her previous hospitalization (was discharged yesterday). She endorses increased dry cough and wheezing. Denies chest pain, palpitations, fevers, chills, myalgias, sore throat, dizziness, syncope, nausea, vomiting or diarrhea. She states she has increased her home O2 from 4 to 5L [place].    Shortness of Breath Associated symptoms: cough and wheezing   Associated symptoms: no chest pain, no fever and no vomiting        Home Medications Prior to Admission medications   Medication Sig Start Date End Date Taking? Authorizing Provider  ADVAIR HFA 230-21 MCG/ACT inhaler Inhale 2 puffs into the lungs 2 (two) times daily.    [provider]  albuterol (VENTOLIN HFA) 108 (90 Base) MCG/ACT inhaler Inhale 2 puffs into the lungs every 4 (four) hours as needed for wheezing or shortness of breath. 11/16/21   Orpah Greek, MD  atorvastatin (LIPITOR) 20 MG tablet Take 1 tablet (20 mg total) by mouth at bedtime. 05/05/16   [place], Elyse Jarvis, FNP  clonazePAM (KLONOPIN) 0.5 MG tablet Take 0.5 mg by mouth See admin instructions. Take 0.5 mg by mouth around 2 PM daily and at bedtime 11/20/21   [provider]  cyclobenzaprine (FLEXERIL) 10 MG tablet Take 10 mg by mouth 3 (three) times daily as needed for muscle spasms. 07/21/21   [provider]  dextromethorphan-guaiFENesin (MUCINEX DM) 30-600 MG 12hr tablet Take 1  tablet by mouth 2 (two) times daily.    [provider]  diclofenac (VOLTAREN) 75 MG EC tablet Take 75 mg by mouth in the morning and at bedtime. 10/15/21   [provider]  fluticasone (FLONASE) 50 MCG/ACT nasal spray Place 1 spray into both nostrils daily.    [provider]  furosemide (LASIX) 20 MG tablet Take 20 mg by mouth in the morning. 05/01/17   [provider]  gabapentin (NEURONTIN) 600 MG tablet Take 600 mg by mouth 3 (three) times daily.    [provider]  hydrOXYzine (ATARAX) 25 MG tablet Take 25 mg by mouth 4 (four) times daily.    [provider]  ipratropium (ATROVENT) 0.02 % nebulizer solution Take 2.5 mLs (0.5 mg total) by nebulization every 6 (six) hours as needed for wheezing or shortness of breath. 11/18/22   Lavina Hamman, MD  levalbuterol Penne Lash) 0.63 MG/3ML nebulizer solution Take 3 mLs (0.63 mg total) by nebulization every 6 (six) hours as needed for wheezing or shortness of breath. 11/18/22   Lavina Hamman, MD  metoprolol tartrate (LOPRESSOR) 25 MG tablet Take 0.5 tablets (12.5 mg total) by mouth 2 (two) times daily. 11/18/22   Lavina Hamman, MD  [facility] 17 GM/SCOOP powder Take 17 g by mouth every other day. 10/20/22   [provider]  mirtazapine (REMERON) 30 MG tablet Take 30 mg by mouth at bedtime.    [provider]  MUCINEX 600 MG 12 hr tablet Take 600 mg by mouth 2 (  two) times daily as needed for cough or to loosen phlegm.    [provider]  Multiple Vitamin (MULTIVITAMIN WITH MINERALS) TABS tablet Take 1 tablet by mouth daily with breakfast.    [provider]  mupirocin ointment (BACTROBAN) 2 % Place 1 Application into the nose 3 (three) times daily.    [provider]  nicotine (NICODERM CQ - DOSED IN MG/24 HOURS) 21 mg/24hr patch Place 21 mg onto the skin daily.    [provider]  OXYGEN Inhale 4 L/min into the lungs continuous.    [provider]  pantoprazole (PROTONIX) 40 MG tablet Take 40 mg by mouth daily before breakfast. 09/28/21   [provider]  potassium chloride SA (K-DUR,KLOR-CON) 20 MEQ tablet Take 20 mEq by mouth 2 (two) times daily.     [provider]  predniSONE (DELTASONE) 10 MG tablet Take '40mg'$  daily for 3days,Take '30mg'$  daily for 3days,Take '20mg'$  daily for 3days,Take '10mg'$  daily for 3days, then stop 11/18/22   Lavina Hamman, MD  rOPINIRole (REQUIP) 1 MG tablet Take 2 mg by mouth at bedtime.    [provider]  SYNTHROID 137 MCG tablet Take 137 mcg by mouth daily before breakfast.    [provider]      Allergies    Budesonide, Promethazine, Lisinopril, and Lithium    Review of Systems   Review of Systems  Constitutional:  Negative for fever.  Respiratory:  Positive for cough, shortness of breath and wheezing.   Cardiovascular:  Negative for chest pain, palpitations and leg swelling.  Gastrointestinal:  Negative for diarrhea, nausea and vomiting.  All other systems reviewed and are negative.   Physical Exam Updated Vital Signs BP (!) 141/84 (BP Location: Right Arm)   Pulse 90   Temp (!) 97.5 F (36.4 C) (Oral)   Resp (!) 22   Ht '5\' 2"'$  (1.575 m)   Wt 80.2 kg   SpO2 99%   BMI 32.34 kg/m  Physical Exam Vitals and nursing note reviewed.  Constitutional:      General: She is not in acute distress.    Appearance: Normal appearance. She is obese. She is ill-appearing.  HENT:     Head: Normocephalic and atraumatic.     Mouth/Throat:     Mouth: Mucous membranes are moist.  Eyes:     General: No scleral icterus.    Extraocular Movements: Extraocular movements intact.     Pupils: Pupils are equal, round, and reactive to light.  Neck:     Vascular: No JVD.  Cardiovascular:     Rate and Rhythm: Normal rate and regular rhythm.     Pulses: Normal pulses.     Heart sounds: No murmur heard. Pulmonary:     Effort: Tachypnea present.     Breath sounds:  Decreased breath sounds and wheezing present.     Comments: Diffuse wheezing and decreased air movement bilaterally  Chest:     Chest wall: No tenderness.  Abdominal:     General: Bowel sounds are normal.     Palpations: Abdomen is soft.  Musculoskeletal:     Cervical back: Normal range of motion.     Right lower leg: No edema.     Left lower leg: No edema.  Skin:    General: Skin is warm and dry.     Capillary Refill: Capillary refill takes less than 2 seconds.     Coloration: Skin is not cyanotic.  Neurological:     General:  No focal deficit present.     Mental Status: She is alert and oriented to person, place, and time.  Psychiatric:        Mood and Affect: Mood normal.        Behavior: Behavior normal.     ED Results / Procedures / Treatments   Labs (all labs ordered are listed, but only abnormal results are displayed) Labs Reviewed  BASIC METABOLIC PANEL - Abnormal; Notable for the following components:      Result Value   CO2 36 (*)    Glucose, Bld 264 (*)    BUN 32 (*)    All other components within normal limits  CBC WITH DIFFERENTIAL/PLATELET - Abnormal; Notable for the following components:   WBC 13.4 (*)    RBC 3.80 (*)    Hemoglobin 11.5 (*)    Neutro Abs 12.3 (*)    Lymphs Abs 0.6 (*)    Abs Immature Granulocytes 0.24 (*)    All other components within normal limits  BLOOD GAS, VENOUS - Abnormal; Notable for the following components:   pCO2, Ven 65 (*)    Bicarbonate 43.1 (*)    Acid-Base Excess 15.0 (*)    All other components within normal limits  TROPONIN I (HIGH SENSITIVITY) - Abnormal; Notable for the following components:   Troponin I (High Sensitivity) 24 (*)    All other components within normal limits  TROPONIN I (HIGH SENSITIVITY) - Abnormal; Notable for the following components:   Troponin I (High Sensitivity) 20 (*)    All other components within normal limits  RESP PANEL BY RT-PCR (RSV, FLU A&B, COVID)  RVPGX2  BRAIN NATRIURETIC PEPTIDE     EKG EKG Interpretation  Date/Time:  Saturday November 19 2022 16:40:15 EST Ventricular Rate:  94 PR Interval:  138 QRS Duration: 102 QT Interval:  365 QTC Calculation: 457 R Axis:   81 Text Interpretation: Sinus rhythm Borderline right axis deviation Confirmed by Dene Gentry [phone]) on 11/19/2022 5:10:34 PM  Radiology DG Chest Port 1 View  Result Date: 11/19/2022 CLINICAL DATA:  Shortness of breath, history of COPD. EXAM: PORTABLE CHEST 1 VIEW COMPARISON:  Chest x-ray November 16, 2022 FINDINGS: The cardiomediastinal silhouette is unchanged in contour. Hyperinflated lungs, consistent with COPD. Minimal right basilar bandlike opacities, likely atelectasis. No additional focal pulmonary opacity. No pleural effusion or pneumothorax. The visualized upper abdomen is unremarkable. No acute osseous abnormality. IMPRESSION: 1. Right basilar bandlike opacities, likely atelectasis. 2. Hyperinflated lungs, consistent with history of COPD. Electronically Signed   By: Beryle Flock M.D.   On: 11/19/2022 16:58    Procedures Procedures   Medications Ordered in ED Medications  ipratropium-albuterol (DUONEB) 0.5-2.5 (3) MG/3ML nebulizer solution 3 mL (3 mLs Nebulization Given 11/19/22 1639)  ipratropium-albuterol (DUONEB) 0.5-2.5 (3) MG/3ML nebulizer solution 3 mL (3 mLs Nebulization Given 11/19/22 1845)  hydrOXYzine (ATARAX) tablet 25 mg (25 mg Oral Given 11/19/22 1906)    ED Course/ Medical Decision Making/ A&P Clinical Course as of 11/19/22 2019  Sat Nov 19, 2022  1810 Does not really feel improved after duoneb. Continues to have wheezing. O2 sat 96% on 3LNC. No objective distress. Will reorder duoneb and she is requesting hydroxyzine. Will order. Will reassess regarding admission vs home afterward.  [LA]    Clinical Course User Index [LA] Mickie Hillier, PA-C                           Medical Decision  Making Amount and/or Complexity of Data Reviewed Labs: ordered. Radiology:  ordered.  Risk Prescription drug management.  Initial Impression and Ddx 66 year old female who presents to the emergency department with worsening shortness of breath, cough, increasing her O2 at home. On initial exam she is mildly tachypneic, diffusely wheezing and has decreased air movement. Oxygenating well on 4L (baseline).  Patient PMH that increases complexity of ED encounter:  COPD, HTN, schizoaffective. Differential: ACS, PE, pneumothorax, pleural effusion, cardiac tamponade, aortic dissection, COPD exacerbation, pneumonia, asthma exacerbation, congestive heart failure, viral upper respiratory infection, medication side effect, anaphylaxis, etc.    Interpretation of Diagnostics I independent reviewed and interpreted the labs as followed: cbc with stable leukocytosis, vbg without significant retention, bnp negative, covid/flu/rsv negative, troponin 24 delta 20  - I independently visualized the following imaging with scope of interpretation limited to determining acute life threatening conditions related to emergency care: CXR, which revealed no acute findings  Patient Reassessment and Ultimate Disposition/Management Initial reassessment after first duoneb with no improvement. She was redose with another duoneb and her home hydroxyzine as she is mildly anxious. I reassessed her and she feels improved.  EKG without ischemia or infarction, troponin x2 negative, doubt ACS  Does not appear fluid volume overloaded on exam, no edema on CXR, BNP 81, doubt CHF exacerbation  Considered but doubt PE at this time.  CXR without evidence of pneumonia, pleural effusion or pneumothorax. Symptoms inconsistent with aortic dissection      She does not have underlying asthma, but has COPD. She continues to intermittently smoke. She is still wheezing and has decreased flow but feels at her baseline. Doubtful we would be able to make her wheeze free. She would rather go home, and I feel that is appropriate.  She was also evaluated by Dr. Francia Greaves, ED attending who agrees with that plan. She is given strict return precautions for worsening symptoms. She is currently on her home 4LNC satting 99%. Will discharge   The patient has been appropriately medically screened and/or stabilized in the ED. I have low suspicion for any other emergent medical condition which would require further screening, evaluation or treatment in the ED or require inpatient management. At time of discharge the patient is hemodynamically stable and in no acute distress. I have discussed work-up results and diagnosis with patient and answered all questions. Patient is agreeable with discharge plan. We discussed strict return precautions for returning to the emergency department and they verbalized understanding.     Patient management required discussion with the following services or consulting groups:  None  Complexity of Problems Addressed Chronic illness with exacerbation  Additional Data Reviewed and Analyzed Further history obtained from: EMS on arrival, Past medical history and medications listed in the EMR, Prior ED visit notes, Recent discharge summary, Care Everywhere, and Prior labs/imaging results  Patient Encounter Risk Assessment Consideration of hospitalization  Final Clinical Impression(s) / ED Diagnoses Final diagnoses:  COPD exacerbation [facility])    Rx / DC Orders ED Discharge Orders     None         Mickie Hillier, PA-C 11/19/22 2019    Valarie Merino, MD 11/20/22 2224

## 2022-11-19 NOTE — Discharge Instructions (Signed)
You were seen in the emergency department today for shortness of breath. This is likely you known COPD. Please use your inhalers and steroids as prescribed. Please wear your oxygen. Please return for worsening shortness of breath or difficulty breathing.

## 2022-11-21 ENCOUNTER — Emergency Department (HOSPITAL_COMMUNITY): Payer: Medicare PPO

## 2022-11-21 ENCOUNTER — Other Ambulatory Visit: Payer: Self-pay

## 2022-11-21 ENCOUNTER — Encounter (HOSPITAL_COMMUNITY): Payer: Self-pay

## 2022-11-21 ENCOUNTER — Observation Stay (HOSPITAL_COMMUNITY)
Admission: EM | Admit: 2022-11-21 | Discharge: 2022-11-23 | Disposition: A | Discharge status: Home / Self Care | Payer: Medicare PPO | Attending: Internal Medicine | Admitting: Internal Medicine

## 2022-11-21 DIAGNOSIS — F17290 Nicotine dependence, other tobacco product, uncomplicated: Secondary | ICD-10-CM | POA: Diagnosis not present

## 2022-11-21 DIAGNOSIS — F17210 Nicotine dependence, cigarettes, uncomplicated: Secondary | ICD-10-CM | POA: Diagnosis not present

## 2022-11-21 DIAGNOSIS — R739 Hyperglycemia, unspecified: Secondary | ICD-10-CM | POA: Insufficient documentation

## 2022-11-21 DIAGNOSIS — Z853 Personal history of malignant neoplasm of breast: Secondary | ICD-10-CM | POA: Insufficient documentation

## 2022-11-21 DIAGNOSIS — D649 Anemia, unspecified: Secondary | ICD-10-CM | POA: Insufficient documentation

## 2022-11-21 DIAGNOSIS — Z79899 Other long term (current) drug therapy: Secondary | ICD-10-CM | POA: Insufficient documentation

## 2022-11-21 DIAGNOSIS — E039 Hypothyroidism, unspecified: Secondary | ICD-10-CM | POA: Diagnosis not present

## 2022-11-21 DIAGNOSIS — K219 Gastro-esophageal reflux disease without esophagitis: Secondary | ICD-10-CM | POA: Diagnosis present

## 2022-11-21 DIAGNOSIS — G2581 Restless legs syndrome: Secondary | ICD-10-CM | POA: Diagnosis present

## 2022-11-21 DIAGNOSIS — Z720 Tobacco use: Secondary | ICD-10-CM | POA: Diagnosis present

## 2022-11-21 DIAGNOSIS — Z1152 Encounter for screening for COVID-19: Secondary | ICD-10-CM | POA: Insufficient documentation

## 2022-11-21 DIAGNOSIS — J441 Chronic obstructive pulmonary disease with (acute) exacerbation: Secondary | ICD-10-CM | POA: Diagnosis present

## 2022-11-21 DIAGNOSIS — J9611 Chronic respiratory failure with hypoxia: Secondary | ICD-10-CM

## 2022-11-21 DIAGNOSIS — I10 Essential (primary) hypertension: Secondary | ICD-10-CM | POA: Insufficient documentation

## 2022-11-21 DIAGNOSIS — F251 Schizoaffective disorder, depressive type: Secondary | ICD-10-CM | POA: Diagnosis present

## 2022-11-21 LAB — RESP PANEL BY RT-PCR (RSV, FLU A&B, COVID)  RVPGX2
Influenza A by PCR: NEGATIVE
Influenza B by PCR: NEGATIVE
Resp Syncytial Virus by PCR: NEGATIVE
SARS Coronavirus 2 by RT PCR: NEGATIVE

## 2022-11-21 LAB — BLOOD GAS, VENOUS
Acid-Base Excess: 11.5 mmol/L — ABNORMAL HIGH (ref 0.0–2.0)
Bicarbonate: 39.6 mmol/L — ABNORMAL HIGH (ref 20.0–28.0)
O2 Saturation: 93 %
Patient temperature: 36.1
pCO2, Ven: 64 mmHg — ABNORMAL HIGH (ref 44–60)
pH, Ven: 7.39 (ref 7.25–7.43)
pO2, Ven: 60 mmHg — ABNORMAL HIGH (ref 32–45)

## 2022-11-21 LAB — BASIC METABOLIC PANEL
Anion gap: 9 (ref 5–15)
BUN: 41 mg/dL — ABNORMAL HIGH (ref 8–23)
CO2: 34 mmol/L — ABNORMAL HIGH (ref 22–32)
Calcium: 9.2 mg/dL (ref 8.9–10.3)
Chloride: 99 mmol/L (ref 98–111)
Creatinine, Ser: 1.02 mg/dL — ABNORMAL HIGH (ref 0.44–1.00)
GFR, Estimated: 60 mL/min — ABNORMAL LOW (ref >60)
Glucose, Bld: 296 mg/dL — ABNORMAL HIGH (ref 70–99)
Potassium: 4.1 mmol/L (ref 3.5–5.1)
Sodium: 142 mmol/L (ref 135–145)

## 2022-11-21 LAB — CBC WITH DIFFERENTIAL/PLATELET
Abs Immature Granulocytes: 0.23 10*3/uL — ABNORMAL HIGH (ref 0.00–0.07)
Basophils Absolute: 0 10*3/uL (ref 0.0–0.1)
Basophils Relative: 0 %
Eosinophils Absolute: 0 10*3/uL (ref 0.0–0.5)
Eosinophils Relative: 0 %
HCT: 34.7 % — ABNORMAL LOW (ref 36.0–46.0)
Hemoglobin: 11 g/dL — ABNORMAL LOW (ref 12.0–15.0)
Immature Granulocytes: 2 %
Lymphocytes Relative: 3 %
Lymphs Abs: 0.4 10*3/uL — ABNORMAL LOW (ref 0.7–4.0)
MCH: 30.6 pg (ref 26.0–34.0)
MCHC: 31.7 g/dL (ref 30.0–36.0)
MCV: 96.4 fL (ref 80.0–100.0)
Monocytes Absolute: 0.6 10*3/uL (ref 0.1–1.0)
Monocytes Relative: 4 %
Neutro Abs: 11.9 10*3/uL — ABNORMAL HIGH (ref 1.7–7.7)
Neutrophils Relative %: 91 %
Platelets: 214 10*3/uL (ref 150–400)
RBC: 3.6 MIL/uL — ABNORMAL LOW (ref 3.87–5.11)
RDW: 13.7 % (ref 11.5–15.5)
WBC: 13.1 10*3/uL — ABNORMAL HIGH (ref 4.0–10.5)
nRBC: 0 % (ref 0.0–0.2)

## 2022-11-21 LAB — HEPATIC FUNCTION PANEL
ALT: 43 U/L (ref 0–44)
AST: 20 U/L (ref 15–41)
Albumin: 3.8 g/dL (ref 3.5–5.0)
Alkaline Phosphatase: 56 U/L (ref 38–126)
Bilirubin, Direct: 0.1 mg/dL (ref 0.0–0.2)
Indirect Bilirubin: 0.3 mg/dL (ref 0.3–0.9)
Total Bilirubin: 0.4 mg/dL (ref 0.3–1.2)
Total Protein: 6.5 g/dL (ref 6.5–8.1)

## 2022-11-21 LAB — PHOSPHORUS: Phosphorus: 3.8 mg/dL (ref 2.5–4.6)

## 2022-11-21 LAB — GLUCOSE, CAPILLARY: Glucose-Capillary: 249 mg/dL — ABNORMAL HIGH (ref 70–99)

## 2022-11-21 LAB — BRAIN NATRIURETIC PEPTIDE: B Natriuretic Peptide: 75.2 pg/mL (ref 0.0–100.0)

## 2022-11-21 LAB — CBG MONITORING, ED: Glucose-Capillary: 316 mg/dL — ABNORMAL HIGH (ref 70–99)

## 2022-11-21 MED ORDER — ACETAMINOPHEN 325 MG PO TABS
650.0000 mg | ORAL_TABLET | Freq: Four times a day (QID) | ORAL | Status: DC | PRN
  Administered 2022-11-21 – 2022-11-23 (×4): 650 mg via ORAL
  Filled 2022-11-21 (×4): qty 2

## 2022-11-21 MED ORDER — ALBUTEROL SULFATE (2.5 MG/3ML) 0.083% IN NEBU
10.0000 mg/h | INHALATION_SOLUTION | Freq: Once | RESPIRATORY_TRACT | Status: AC
  Administered 2022-11-21: 10 mg/h via RESPIRATORY_TRACT
  Filled 2022-11-21: qty 12

## 2022-11-21 MED ORDER — ONDANSETRON HCL 4 MG/2ML IJ SOLN: 4.0000 mg | Freq: Four times a day (QID) | INTRAMUSCULAR | Status: DC | PRN

## 2022-11-21 MED ORDER — GABAPENTIN 300 MG PO CAPS
600.0000 mg | ORAL_CAPSULE | Freq: Three times a day (TID) | ORAL | Status: DC
  Administered 2022-11-21 – 2022-11-23 (×7): 600 mg via ORAL
  Filled 2022-11-21 (×7): qty 2

## 2022-11-21 MED ORDER — PREDNISONE 20 MG PO TABS
40.0000 mg | ORAL_TABLET | Freq: Every day | ORAL | Status: DC
  Administered 2022-11-22 – 2022-11-23 (×2): 40 mg via ORAL
  Filled 2022-11-21 (×2): qty 2

## 2022-11-21 MED ORDER — METOPROLOL TARTRATE 12.5 MG HALF TABLET
12.5000 mg | ORAL_TABLET | Freq: Two times a day (BID) | ORAL | Status: DC
  Administered 2022-11-21 – 2022-11-23 (×4): 12.5 mg via ORAL
  Filled 2022-11-21 (×4): qty 1

## 2022-11-21 MED ORDER — FUROSEMIDE 20 MG PO TABS
20.0000 mg | ORAL_TABLET | Freq: Every day | ORAL | Status: DC
  Administered 2022-11-22 – 2022-11-23 (×2): 20 mg via ORAL
  Filled 2022-11-21 (×2): qty 1

## 2022-11-21 MED ORDER — PANTOPRAZOLE SODIUM 40 MG PO TBEC
40.0000 mg | DELAYED_RELEASE_TABLET | Freq: Every day | ORAL | Status: DC
  Administered 2022-11-22 – 2022-11-23 (×2): 40 mg via ORAL
  Filled 2022-11-21 (×2): qty 1

## 2022-11-21 MED ORDER — HYDROXYZINE HCL 25 MG PO TABS
25.0000 mg | ORAL_TABLET | Freq: Four times a day (QID) | ORAL | Status: DC
  Administered 2022-11-21 – 2022-11-23 (×9): 25 mg via ORAL
  Filled 2022-11-21 (×9): qty 1

## 2022-11-21 MED ORDER — INSULIN ASPART 100 UNIT/ML IJ SOLN
0.0000 [IU] | Freq: Three times a day (TID) | INTRAMUSCULAR | Status: DC
  Administered 2022-11-21: 15 [IU] via SUBCUTANEOUS
  Administered 2022-11-22: 3 [IU] via SUBCUTANEOUS
  Administered 2022-11-22: 7 [IU] via SUBCUTANEOUS
  Administered 2022-11-23 (×2): 4 [IU] via SUBCUTANEOUS
  Filled 2022-11-21: qty 0.2

## 2022-11-21 MED ORDER — NICOTINE 21 MG/24HR TD PT24
21.0000 mg | MEDICATED_PATCH | Freq: Every day | TRANSDERMAL | Status: DC
  Administered 2022-11-22 – 2022-11-23 (×2): 21 mg via TRANSDERMAL
  Filled 2022-11-21 (×2): qty 1

## 2022-11-21 MED ORDER — DM-GUAIFENESIN ER 30-600 MG PO TB12
1.0000 | ORAL_TABLET | Freq: Two times a day (BID) | ORAL | Status: DC
  Administered 2022-11-21 – 2022-11-23 (×4): 1 via ORAL
  Filled 2022-11-21 (×4): qty 1

## 2022-11-21 MED ORDER — IPRATROPIUM-ALBUTEROL 0.5-2.5 (3) MG/3ML IN SOLN
3.0000 mL | Freq: Four times a day (QID) | RESPIRATORY_TRACT | Status: DC
  Administered 2022-11-21 – 2022-11-23 (×7): 3 mL via RESPIRATORY_TRACT
  Filled 2022-11-21 (×7): qty 3

## 2022-11-21 MED ORDER — LEVOTHYROXINE SODIUM 25 MCG PO TABS
137.0000 ug | ORAL_TABLET | Freq: Every day | ORAL | Status: DC
  Administered 2022-11-22 – 2022-11-23 (×2): 137 ug via ORAL
  Filled 2022-11-21 (×2): qty 1

## 2022-11-21 MED ORDER — CLONAZEPAM 0.5 MG PO TABS
0.5000 mg | ORAL_TABLET | Freq: Two times a day (BID) | ORAL | Status: DC
  Administered 2022-11-21 – 2022-11-23 (×4): 0.5 mg via ORAL
  Filled 2022-11-21 (×4): qty 1

## 2022-11-21 MED ORDER — GUAIFENESIN ER 600 MG PO TB12: 600.0000 mg | ORAL_TABLET | Freq: Two times a day (BID) | ORAL | Status: DC | PRN

## 2022-11-21 MED ORDER — MAGNESIUM SULFATE 2 GM/50ML IV SOLN
2.0000 g | Freq: Once | INTRAVENOUS | Status: AC
  Administered 2022-11-21: 2 g via INTRAVENOUS
  Filled 2022-11-21: qty 50

## 2022-11-21 MED ORDER — POTASSIUM CHLORIDE CRYS ER 20 MEQ PO TBCR
20.0000 meq | EXTENDED_RELEASE_TABLET | Freq: Two times a day (BID) | ORAL | Status: DC
  Administered 2022-11-21 – 2022-11-23 (×4): 20 meq via ORAL
  Filled 2022-11-21 (×4): qty 1

## 2022-11-21 MED ORDER — ONDANSETRON HCL 4 MG PO TABS: 4.0000 mg | ORAL_TABLET | Freq: Four times a day (QID) | ORAL | Status: DC | PRN

## 2022-11-21 MED ORDER — ROPINIROLE HCL 1 MG PO TABS
2.0000 mg | ORAL_TABLET | Freq: Every day | ORAL | Status: DC
  Administered 2022-11-21 – 2022-11-22 (×2): 2 mg via ORAL
  Filled 2022-11-21 (×2): qty 2

## 2022-11-21 MED ORDER — METHYLPREDNISOLONE SODIUM SUCC 125 MG IJ SOLR
60.0000 mg | Freq: Once | INTRAMUSCULAR | Status: AC
  Administered 2022-11-21: 60 mg via INTRAVENOUS
  Filled 2022-11-21: qty 2

## 2022-11-21 MED ORDER — ALBUTEROL SULFATE (2.5 MG/3ML) 0.083% IN NEBU: 2.5000 mg | INHALATION_SOLUTION | RESPIRATORY_TRACT | Status: DC | PRN

## 2022-11-21 MED ORDER — CLONAZEPAM 0.5 MG PO TABS: 0.5000 mg | ORAL_TABLET | ORAL | Status: DC

## 2022-11-21 MED ORDER — MIRTAZAPINE 15 MG PO TABS
30.0000 mg | ORAL_TABLET | Freq: Every day | ORAL | Status: DC
  Administered 2022-11-21 – 2022-11-22 (×2): 30 mg via ORAL
  Filled 2022-11-21 (×2): qty 2

## 2022-11-21 MED ORDER — ACETAMINOPHEN 650 MG RE SUPP: 650.0000 mg | Freq: Four times a day (QID) | RECTAL | Status: DC | PRN

## 2022-11-21 NOTE — ED Provider Notes (Signed)
[facility] DEPT Provider Note   CSN: [id number] Arrival date & time: 11/21/22  1001     History  Chief Complaint  Patient presents with   COPD exacerbation    Audrey Meyer is a 66 y.o. female.  HPI   66 year old female with medical history significant for hypothyroidism, HLD, COPD on oxygen chronically 4 L O2 via nasal cannula, schizoaffective disorder, who presents emergency department with worsening shortness of breath.  The patient was seen in the emergency department for the same 2 days ago and diagnosed with a COPD exacerbation and subsequently discharged after consideration for admission was made.  The patient endorses persistent symptoms of productive cough, dyspnea despite albuterol treatments.  She states that she recently had her home Lasix increased, denies any lower extremity swelling.  She denies any fevers or chills.  Denies any chest pain.  Home Medications Prior to Admission medications   Medication Sig Start Date End Date Taking? Authorizing Provider  ADVAIR HFA 230-21 MCG/ACT inhaler Inhale 2 puffs into the lungs 2 (two) times daily.   Yes [provider]  albuterol (VENTOLIN HFA) 108 (90 Base) MCG/ACT inhaler Inhale 2 puffs into the lungs every 4 (four) hours as needed for wheezing or shortness of breath. 11/16/21  Yes Pollina, Gwenyth Allegra, MD  atorvastatin (LIPITOR) 20 MG tablet Take 1 tablet (20 mg total) by mouth at bedtime. 05/05/16  Yes Withrow, Elyse Jarvis, FNP  clonazePAM (KLONOPIN) 0.5 MG tablet Take 0.5 mg by mouth See admin instructions. Take 0.5 mg by mouth around 2 PM daily and at bedtime 11/20/21  Yes [provider]  cyclobenzaprine (FLEXERIL) 10 MG tablet Take 10 mg by mouth 3 (three) times daily as needed for muscle spasms. 07/21/21  Yes [provider]  dextromethorphan-guaiFENesin (MUCINEX DM) 30-600 MG 12hr tablet Take 1 tablet by mouth 2 (two) times daily as needed for cough (if plain Mucinex  or Mucinex-D are not being taken).   Yes [provider]  diclofenac (VOLTAREN) 75 MG EC tablet Take 75 mg by mouth in the morning and at bedtime. 10/15/21  Yes [provider]  fluticasone (FLONASE) 50 MCG/ACT nasal spray Place 1 spray into both nostrils daily.   Yes [provider]  furosemide (LASIX) 20 MG tablet Take 40 mg by mouth in the morning. 05/01/17  Yes [provider]  gabapentin (NEURONTIN) 600 MG tablet Take 600 mg by mouth 3 (three) times daily.   Yes [provider]  hydrOXYzine (ATARAX) 25 MG tablet Take 25 mg by mouth 4 (four) times daily.   Yes [provider]  ipratropium (ATROVENT) 0.02 % nebulizer solution Take 2.5 mLs (0.5 mg total) by nebulization every 6 (six) hours as needed for wheezing or shortness of breath. 11/18/22  Yes Lavina Hamman, MD  levalbuterol Penne Lash) 0.63 MG/3ML nebulizer solution Take 3 mLs (0.63 mg total) by nebulization every 6 (six) hours as needed for wheezing or shortness of breath. 11/18/22  Yes Lavina Hamman, MD  lisinopril (ZESTRIL) 5 MG tablet Take 5 mg by mouth daily as needed (if Systolic number is 384 or greater).   Yes [provider]  metoprolol tartrate (LOPRESSOR) 25 MG tablet Take 0.5 tablets (12.5 mg total) by mouth 2 (two) times daily. 11/18/22  Yes Lavina Hamman, MD  [facility] 17 GM/SCOOP powder Take 17 g by mouth every other day. 10/20/22  Yes [provider]  mirtazapine (REMERON) 30 MG tablet Take 30 mg by mouth at  bedtime.   Yes [provider]  MUCINEX 600 MG 12 hr tablet Take 600 mg by mouth 2 (two) times daily as needed for cough or to loosen phlegm (if Mucinex DM or Mucinex-D are not being taken).   Yes [provider]  MUCINEX D 60-600 MG 12 hr tablet Take 1 tablet by mouth every 12 (twelve) hours as needed for congestion (if plain Mucinex or Mucinex DM are not being taken).   Yes [provider]  Multiple Vitamin (MULTIVITAMIN  WITH MINERALS) TABS tablet Take 1 tablet by mouth daily with breakfast.   Yes [provider]  mupirocin ointment (BACTROBAN) 2 % Place 1 Application into the nose See admin instructions. Place into the nostrils 1-2 times a day   Yes [provider]  nicotine (NICODERM CQ - DOSED IN MG/24 HOURS) 21 mg/24hr patch Place 21 mg onto the skin daily.   Yes [provider]  OXYGEN Inhale 4 L/min into the lungs continuous.   Yes [provider]  pantoprazole (PROTONIX) 40 MG tablet Take 40 mg by mouth daily before breakfast. 09/28/21  Yes [provider]  potassium chloride SA (K-DUR,KLOR-CON) 20 MEQ tablet Take 20 mEq by mouth 2 (two) times daily.    Yes [provider]  predniSONE (DELTASONE) 10 MG tablet Take '40mg'$  daily for 3days,Take '30mg'$  daily for 3days,Take '20mg'$  daily for 3days,Take '10mg'$  daily for 3days, then stop Patient taking differently: Take 10-40 mg by mouth See admin instructions. Take 40 mg by mouth daily for 3 days, 30 mg daily for 3 days, 20 mg daily for 3 days,10 mg daily for 3 days, then stop 11/18/22  Yes Lavina Hamman, MD  rOPINIRole (REQUIP) 1 MG tablet Take 2 mg by mouth at bedtime.   Yes [provider]  SYNTHROID 137 MCG tablet Take 137 mcg by mouth daily before breakfast.   Yes [provider]      Allergies    Budesonide, Promethazine, Lisinopril, and Lithium    Review of Systems   Review of Systems  All other systems reviewed and are negative.   Physical Exam Updated Vital Signs BP (!) 145/70 (BP Location: Right Arm)   Pulse 91   Temp 98 F (36.7 C) (Oral)   Resp 17   Ht '5\' 2"'$  (1.575 m)   Wt 80.3 kg   SpO2 96%   BMI 32.37 kg/m  Physical Exam Vitals and nursing note reviewed.  Constitutional:      General: She is not in acute distress.    Appearance: She is well-developed.  HENT:     Head: Normocephalic and atraumatic.  Eyes:     Conjunctiva/sclera: Conjunctivae normal.  Neck:      Vascular: No JVD.  Cardiovascular:     Rate and Rhythm: Normal rate and regular rhythm.  Pulmonary:     Effort: Pulmonary effort is normal. No respiratory distress.     Breath sounds: Wheezing present.     Comments: Fairly minimal air movement bilaterally, expiratory wheezing present in all lung fields, no tachypnea, no respiratory distress Abdominal:     Palpations: Abdomen is soft.     Tenderness: There is no abdominal tenderness.  Musculoskeletal:        General: No swelling.     Cervical back: Neck supple.  Skin:    General: Skin is warm and dry.     Capillary Refill: Capillary refill takes less than 2 seconds.  Neurological:     Mental Status: She  is alert.  Psychiatric:        Mood and Affect: Mood normal.     ED Results / Procedures / Treatments   Labs (all labs ordered are listed, but only abnormal results are displayed) Labs Reviewed  BASIC METABOLIC PANEL - Abnormal; Notable for the following components:      Result Value   CO2 34 (*)    Glucose, Bld 296 (*)    BUN 41 (*)    Creatinine, Ser 1.02 (*)    GFR, Estimated 60 (*)    All other components within normal limits  CBC WITH DIFFERENTIAL/PLATELET - Abnormal; Notable for the following components:   WBC 13.1 (*)    RBC 3.60 (*)    Hemoglobin 11.0 (*)    HCT 34.7 (*)    Neutro Abs 11.9 (*)    Lymphs Abs 0.4 (*)    Abs Immature Granulocytes 0.23 (*)    All other components within normal limits  BLOOD GAS, VENOUS - Abnormal; Notable for the following components:   pCO2, Ven 64 (*)    pO2, Ven 60 (*)    Bicarbonate 39.6 (*)    Acid-Base Excess 11.5 (*)    All other components within normal limits  CBG MONITORING, ED - Abnormal; Notable for the following components:   Glucose-Capillary 316 (*)    All other components within normal limits  RESP PANEL BY RT-PCR (RSV, FLU A&B, COVID)  RVPGX2  BRAIN NATRIURETIC PEPTIDE  HEPATIC FUNCTION PANEL  PHOSPHORUS  HEMOGLOBIN A1C  CBC  COMPREHENSIVE METABOLIC PANEL   HEMOGLOBIN A1C    EKG EKG Interpretation  Date/Time:  Monday November 21 2022 10:25:26 EST Ventricular Rate:  91 PR Interval:    QRS Duration: 102 QT Interval:  378 QTC Calculation: 466 R Axis:   77 Text Interpretation: Atrial fibrillation Confirmed by Regan Lemming ([id number]) on 11/21/2022 10:52:49 AM  Radiology DG Chest Port 1 View  Result Date: 11/21/2022 CLINICAL DATA:  COPD exacerbation. EXAM: PORTABLE CHEST 1 VIEW COMPARISON:  11/19/2022 FINDINGS: Lungs are hyperinflated. Heart size is normal. There is perihilar peribronchial thickening. No focal consolidations or pleural effusions. IMPRESSION: Hyperinflation and bronchitic changes. No focal infiltrate. Electronically Signed   By: Nolon Nations M.D.   On: 11/21/2022 10:54    Procedures Procedures    Medications Ordered in ED Medications  acetaminophen (TYLENOL) tablet 650 mg (650 mg Oral Given 11/21/22 1921)    Or  acetaminophen (TYLENOL) suppository 650 mg ( Rectal See Alternative 11/21/22 1921)  ondansetron (ZOFRAN) tablet 4 mg (has no administration in time range)    Or  ondansetron (ZOFRAN) injection 4 mg (has no administration in time range)  ipratropium-albuterol (DUONEB) 0.5-2.5 (3) MG/3ML nebulizer solution 3 mL (3 mLs Nebulization Not Given 11/21/22 1600)  albuterol (PROVENTIL) (2.5 MG/3ML) 0.083% nebulizer solution 2.5 mg (has no administration in time range)  predniSONE (DELTASONE) tablet 40 mg (has no administration in time range)  insulin aspart (novoLOG) injection 0-20 Units (15 Units Subcutaneous Given 11/21/22 1810)  pantoprazole (PROTONIX) EC tablet 40 mg (has no administration in time range)  rOPINIRole (REQUIP) tablet 2 mg (has no administration in time range)  levothyroxine (SYNTHROID) tablet 137 mcg (has no administration in time range)  nicotine (NICODERM CQ - dosed in mg/24 hours) patch 21 mg (has no administration in time range)  guaiFENesin (MUCINEX) 12 hr tablet 600 mg (has no administration  in time range)  mirtazapine (REMERON) tablet 30 mg (has no administration in time range)  metoprolol tartrate (  LOPRESSOR) tablet 12.5 mg (has no administration in time range)  gabapentin (NEURONTIN) capsule 600 mg (600 mg Oral Given 11/21/22 1810)  hydrOXYzine (ATARAX) tablet 25 mg (25 mg Oral Given 11/21/22 1810)  furosemide (LASIX) tablet 20 mg (has no administration in time range)  potassium chloride SA (KLOR-CON M) CR tablet 20 mEq (has no administration in time range)  dextromethorphan-guaiFENesin (MUCINEX DM) 30-600 MG per 12 hr tablet 1 tablet (has no administration in time range)  clonazePAM (KLONOPIN) tablet 0.5 mg (has no administration in time range)  albuterol (PROVENTIL) (2.5 MG/3ML) 0.083% nebulizer solution (10 mg/hr Nebulization Given 11/21/22 1052)  magnesium sulfate IVPB 2 g 50 mL (0 g Intravenous Stopped 11/21/22 1231)  methylPREDNISolone sodium succinate (SOLU-MEDROL) 125 mg/2 mL injection 60 mg (60 mg Intravenous Given 11/21/22 1126)    ED Course/ Medical Decision Making/ A&P                           Medical Decision Making Amount and/or Complexity of Data Reviewed Labs: ordered. Radiology: ordered.  Risk Prescription drug management. Decision regarding hospitalization.    66 year old female with medical history significant for hypothyroidism, HLD, COPD on oxygen chronically 4 L O2 via nasal cannula, schizoaffective disorder, who presents emergency department with worsening shortness of breath.  The patient was seen in the emergency department for the same 2 days ago and diagnosed with a COPD exacerbation and subsequently discharged after consideration for admission was made.  The patient endorses persistent symptoms of productive cough, dyspnea despite albuterol treatments.  She states that she recently had her home Lasix increased, denies any lower extremity swelling.  She denies any fevers or chills.  Denies any chest pain.  On arrival, patient was afebrile, not  tachycardic P93, mildly tachypneic RR 21, BP 138/69, saturating 100% on her home 4 L O2 via nasal cannula.  Physical exam concerning for COPD exacerbation with minimal air movement, expiratory wheezing present in all lung fields.  No evidence for CHF exacerbation with no lower extremity edema, no JVD on exam.  IV access obtained the patient was administered IV magnesium, IV Solu-Medrol, started on continuous albuterol nebulizer.  The patient was weaned off of continuous but had persistent mild tachypnea and expiratory wheezing present.  Laboratory evaluation significant for CBC with leukocytosis 13.1, anemia to 11.0, BMP with hyperglycemia to 296, no anion gap acidosis with an alkalosis present, VBG with mildly elevated pCO2 to 64, pH with evidence of chronic metabolic compensation for respiratory alkalosis with pH of 7.39 and a bicarbonate of 39.6.  BMP was unremarkable at 35, chest x-ray revealed no evidence of pneumonia, COVID-19 influenza and RSV PCR testing resulted negative.  Given the patient's persistent wheezing and dyspnea, I recommended admission for observation and continued DuoNeb treatments.  Dr. Olevia Bowens of hospitalist medicine was consulted and accepted the patient in admission.    Final Clinical Impression(s) / ED Diagnoses Final diagnoses:  COPD exacerbation [facility])    Rx / DC Orders ED Discharge Orders     None         Regan Lemming, MD 11/21/22 [place]

## 2022-11-21 NOTE — H&P (Addendum)
History and Physical    Patient: Audrey Meyer DOB: [birth year] DOA: 11/21/2022 DOS: the patient was seen and examined on 11/21/2022 PCP: Ronald Pippins, NP  Patient coming from: Home  Chief Complaint:  Chief Complaint  Patient presents with   COPD exacerbation   HPI: Audrey Meyer is a 66 y.o. female with medical history significant of GERD, breast cancer, history of radiation therapy, drug overdose, suicide attempt, schizoaffective disorder, hyperlipidemia, hypertension, hyponatremia, hypothyroidism, COPD with chronic respiratory failure on home oxygen who was recently admitted due to dyspnea in the setting of COPD exacerbation, went home but has continued to have wheezing.  The patient continues to smoke cigarettes.  She has been given multiple prednisone tapers.  No fever, chills or night sweats. No sore throat, rhinorrhea.  No chest pain, palpitations, diaphoresis, PND, orthopnea or pitting edema of the lower extremities.  No appetite changes, abdominal pain, diarrhea, constipation, melena or hematochezia.  No flank pain, dysuria, frequency or hematuria.  No polyuria, polydipsia, polyphagia or blurred vision.  ED course: Initial vital signs were temperature 98.1 F, pulse 93, respiration 21, BP 130/69 mmHg O2 sat 100% on 4 L nasal cannula.  Patient received magnesium sulfate 2 g IVPB, methylprednisolone 60 mg IVP and a continuous 10 mg albuterol neb treatment.  Lab work: Coronavirus, influenza and RSV PCR was negative.  BNP 75.2 pg/mL.  CBC showed a white count of 13.1, hemoglobin 11.0 g/dL platelets 214.  BMP with a sodium 142, potassium 4.1, chloride 99 and CO2 34 mmol/L.  Glucose 296, BUN 41 and creatinine 1.02 mg/dL.  Imaging: Portable 1 view chest radiograph is showing hyperinflation and bronchitic changes.  There is no acute infiltrate.   Review of Systems: As mentioned in the history of present illness. All other systems reviewed and are negative.  Past Medical  History:  Diagnosis Date   Acid reflux    Breast cancer ([place])    COPD (chronic obstructive pulmonary disease) ([place])    Drug overdose 04/24/2017   High cholesterol    Hypertension    Hyponatremia 01/31/2017   Hypothyroid    Personal history of radiation therapy    Schizoaffective disorder    Suicide attempt ([place]) 04/25/2017   Past Surgical History:  Procedure Laterality Date   APPENDECTOMY     BREAST LUMPECTOMY Left 2004   TUBAL LIGATION     Social History:  reports that she has been smoking cigars and cigarettes. She has never used smokeless tobacco. She reports current drug use. Drug: Marijuana. She reports that she does not drink alcohol.  Allergies  Allergen Reactions   Budesonide Shortness Of Breath   Promethazine Nausea And Vomiting   Lisinopril Other (See Comments)    "Dizzy spells"- Patient reports that this medication causes dizziness and she can "take it in small doses, like 5 mg."   Lithium Other (See Comments)    "Cannot function" on this    Family History  Problem Relation Age of Onset   Heart attack Father    Hypertension Father    Cancer Other     Prior to Admission medications   Medication Sig Start Date End Date Taking? Authorizing Provider  ADVAIR HFA 230-21 MCG/ACT inhaler Inhale 2 puffs into the lungs 2 (two) times daily.    [provider]  albuterol (VENTOLIN HFA) 108 (90 Base) MCG/ACT inhaler Inhale 2 puffs into the lungs every 4 (four) hours as needed for wheezing or shortness of breath. 11/16/21   Orpah Greek,  MD  atorvastatin (LIPITOR) 20 MG tablet Take 1 tablet (20 mg total) by mouth at bedtime. 05/05/16   Withrow, Elyse Jarvis, FNP  clonazePAM (KLONOPIN) 0.5 MG tablet Take 0.5 mg by mouth See admin instructions. Take 0.5 mg by mouth around 2 PM daily and at bedtime 11/20/21   [provider]  cyclobenzaprine (FLEXERIL) 10 MG tablet Take 10 mg by mouth 3 (three) times daily as needed for muscle spasms. 07/21/21   [provider]  dextromethorphan-guaiFENesin (MUCINEX DM) 30-600 MG 12hr tablet Take 1 tablet by mouth 2 (two) times daily.    [provider]  diclofenac (VOLTAREN) 75 MG EC tablet Take 75 mg by mouth in the morning and at bedtime. 10/15/21   [provider]  fluticasone (FLONASE) 50 MCG/ACT nasal spray Place 1 spray into both nostrils daily.    [provider]  furosemide (LASIX) 20 MG tablet Take 20 mg by mouth in the morning. 05/01/17   [provider]  gabapentin (NEURONTIN) 600 MG tablet Take 600 mg by mouth 3 (three) times daily.    [provider]  hydrOXYzine (ATARAX) 25 MG tablet Take 25 mg by mouth 4 (four) times daily.    [provider]  ipratropium (ATROVENT) 0.02 % nebulizer solution Take 2.5 mLs (0.5 mg total) by nebulization every 6 (six) hours as needed for wheezing or shortness of breath. 11/18/22   Lavina Hamman, MD  levalbuterol Penne Lash) 0.63 MG/3ML nebulizer solution Take 3 mLs (0.63 mg total) by nebulization every 6 (six) hours as needed for wheezing or shortness of breath. 11/18/22   Lavina Hamman, MD  metoprolol tartrate (LOPRESSOR) 25 MG tablet Take 0.5 tablets (12.5 mg total) by mouth 2 (two) times daily. 11/18/22   Lavina Hamman, MD  [facility] 17 GM/SCOOP powder Take 17 g by mouth every other day. 10/20/22   [provider]  mirtazapine (REMERON) 30 MG tablet Take 30 mg by mouth at bedtime.    [provider]  MUCINEX 600 MG 12 hr tablet Take 600 mg by mouth 2 (two) times daily as needed for cough or to loosen phlegm.    [provider]  Multiple Vitamin (MULTIVITAMIN WITH MINERALS) TABS tablet Take 1 tablet by mouth daily with breakfast.    [provider]  mupirocin ointment (BACTROBAN) 2 % Place 1 Application into the nose 3 (three) times daily.    [provider]  nicotine (NICODERM CQ - DOSED IN MG/24 HOURS) 21 mg/24hr patch Place 21 mg onto the skin daily.     [provider]  OXYGEN Inhale 4 L/min into the lungs continuous.    [provider]  pantoprazole (PROTONIX) 40 MG tablet Take 40 mg by mouth daily before breakfast. 09/28/21   [provider]  potassium chloride SA (K-DUR,KLOR-CON) 20 MEQ tablet Take 20 mEq by mouth 2 (two) times daily.     [provider]  predniSONE (DELTASONE) 10 MG tablet Take '40mg'$  daily for 3days,Take '30mg'$  daily for 3days,Take '20mg'$  daily for 3days,Take '10mg'$  daily for 3days, then stop 11/18/22   Lavina Hamman, MD  rOPINIRole (REQUIP) 1 MG tablet Take 2 mg by mouth at bedtime.    [provider]  SYNTHROID 137 MCG tablet Take 137 mcg by mouth daily before breakfast.    [provider]    Physical Exam: Vitals:   11/21/22 1054 11/21/22 1200 11/21/22 1215 11/21/22 1230  BP:  (!) 152/74 (!) 120/107 (!) 87/78  Pulse:  90 91 93  Resp:  (!) 21 (!) 23 (!) 26  Temp:      TempSrc:      SpO2: 99% 100% 100% 100%  Weight:      Height:       Physical Exam Vitals and nursing note reviewed.  Constitutional:      General: She is awake. She is not in acute distress.    Appearance: Normal appearance. She is obese. She is ill-appearing.  HENT:     Head: Normocephalic.     Nose: No rhinorrhea.     Mouth/Throat:     Mouth: Mucous membranes are dry.  Eyes:     General: No scleral icterus.    Pupils: Pupils are equal, round, and reactive to light.  Neck:     Vascular: No JVD.  Cardiovascular:     Rate and Rhythm: Normal rate and regular rhythm.     Heart sounds: S1 normal and S2 normal.  Pulmonary:     Effort: Pulmonary effort is normal.     Breath sounds: Wheezing and rhonchi present. No rales.  Abdominal:     General: Bowel sounds are normal.     Palpations: Abdomen is soft.     Tenderness: There is no abdominal tenderness.  Musculoskeletal:     Cervical back: Neck supple.     Right lower leg: No edema.     Left lower leg: No edema.  Skin:    General: Skin is  warm and dry.  Neurological:     General: No focal deficit present.     Mental Status: She is alert and oriented to person, place, and time.  Psychiatric:        Mood and Affect: Mood normal.        Behavior: Behavior normal. Behavior is cooperative.     Data Reviewed:  Results are pending, will review when available.  EKG with sinus arrhythmia/artifact. Sinus rhythm on on the monitor when evaluated.  Assessment and Plan: Principal Problem:   Chronic respiratory failure with hypoxia,  on home O2 therapy (HCC) - 4 L/min presenting with:   COPD with acute exacerbation (HCC) Observation/telemetry Continue supplemental oxygen. Methylprednisolone 60 mg mg IVP x1 given earlier. Followed by prednisone 40 mg p.o. daily in a.m. Scheduled and as needed bronchodilators. Follow-up CBC and chemistry in the morning.   Active Problems:   Hyperglycemia  Worsened by glucocorticoids. The patient also has class I obesity. Current BMI is 32.37 kg/m. Check hemoglobin A1c.. Carbohydrate modified diet. Regular insulin with sliding scale before meals.    Hypothyroidism Continue levothyroxine 137 mcg p.o. daily.    Schizoaffective disorder, depressive type (HCC) Continue clonazepam 0.5 mg p.o. twice daily. Continue hydroxyzine 25 mg p.o. 3 times daily. Continue Remeron 15 mg p.o. bedtime. Continue sertraline 100 mg p.o. daily.    Essential hypertension Continue furosemide 20 mg p.o. daily. KCl 20 mEq p.o. twice daily. Continue lisinopril 5 mg p.o. daily. Monitor BP, renal function and electrolytes.    Tobacco abuse Nicotine replacement therapy ordered. Tobacco cessation advised.    GERD (gastroesophageal reflux disease) Continue pantoprazole 40 mg p.o. daily.    Normocytic anemia Monitor hematocrit and hemoglobin.    Restless legs syndrome (RLS) Continue ropinirole 2 mg p.o. bedtime.      Advance Care Planning:   Code Status: Full code.  Consults:   Family  Communication:   Severity of Illness: The appropriate patient status for this patient is OBSERVATION. Observation status is judged to be  reasonable and necessary in order to provide the required intensity of service to ensure the patient's safety. The patient's presenting symptoms, physical exam findings, and initial radiographic and laboratory data in the context of their medical condition is felt to place them at decreased risk for further clinical deterioration. Furthermore, it is anticipated that the patient will be medically stable for discharge from the hospital within 2 midnights of admission.   Author: Reubin Milan, MD 11/21/2022 1:33 PM  For on call review [email or web address].   This document was prepared using Dragon voice recognition software and may contain some unintended transcription errors.

## 2022-11-21 NOTE — ED Triage Notes (Signed)
Pt BIB EMS from home with c/o COPD exacerbation x2weeks. 4L baseline at home. Expiratory wheezing. Multiple visits since 11/05/2022, hx of same. Lasix increased.  Neb tx 0.5 Atrovent '5mg'$  albuterol  BP 180/80 HR 93 [place] 97% RR 28

## 2022-11-22 DIAGNOSIS — J441 Chronic obstructive pulmonary disease with (acute) exacerbation: Secondary | ICD-10-CM | POA: Diagnosis not present

## 2022-11-22 LAB — COMPREHENSIVE METABOLIC PANEL
ALT: 34 U/L (ref 0–44)
AST: 13 U/L — ABNORMAL LOW (ref 15–41)
Albumin: 3.2 g/dL — ABNORMAL LOW (ref 3.5–5.0)
Alkaline Phosphatase: 39 U/L (ref 38–126)
Anion gap: 7 (ref 5–15)
BUN: 29 mg/dL — ABNORMAL HIGH (ref 8–23)
CO2: 38 mmol/L — ABNORMAL HIGH (ref 22–32)
Calcium: 8.6 mg/dL — ABNORMAL LOW (ref 8.9–10.3)
Chloride: 96 mmol/L — ABNORMAL LOW (ref 98–111)
Creatinine, Ser: 0.68 mg/dL (ref 0.44–1.00)
GFR, Estimated: >60 mL/min (ref >60)
Glucose, Bld: 117 mg/dL — ABNORMAL HIGH (ref 70–99)
Potassium: 3.5 mmol/L (ref 3.5–5.1)
Sodium: 141 mmol/L (ref 135–145)
Total Bilirubin: 0.4 mg/dL (ref 0.3–1.2)
Total Protein: 5.8 g/dL — ABNORMAL LOW (ref 6.5–8.1)

## 2022-11-22 LAB — CBC
HCT: 33 % — ABNORMAL LOW (ref 36.0–46.0)
Hemoglobin: 10.5 g/dL — ABNORMAL LOW (ref 12.0–15.0)
MCH: 30.3 pg (ref 26.0–34.0)
MCHC: 31.8 g/dL (ref 30.0–36.0)
MCV: 95.4 fL (ref 80.0–100.0)
Platelets: 200 10*3/uL (ref 150–400)
RBC: 3.46 MIL/uL — ABNORMAL LOW (ref 3.87–5.11)
RDW: 13.4 % (ref 11.5–15.5)
WBC: 12.5 10*3/uL — ABNORMAL HIGH (ref 4.0–10.5)
nRBC: 0 % (ref 0.0–0.2)

## 2022-11-22 LAB — GLUCOSE, CAPILLARY
Glucose-Capillary: 110 mg/dL — ABNORMAL HIGH (ref 70–99)
Glucose-Capillary: 225 mg/dL — ABNORMAL HIGH (ref 70–99)
Glucose-Capillary: 156 mg/dL — ABNORMAL HIGH (ref 70–99)
Glucose-Capillary: 138 mg/dL — ABNORMAL HIGH (ref 70–99)

## 2022-11-22 NOTE — Discharge Summary (Signed)
Physician Discharge Summary   Patient: Audrey Meyer MRN: [id number] DOB: Sep 03, 1956  Admit date:     11/16/2022  Discharge date: 11/18/2022  Discharge Physician: Berle Mull  PCP: Ronald Pippins, NP  Recommendations at discharge: Follow-up with PCP in 1 week  Discharge Diagnoses: Principal Problem:   Acute on chronic respiratory failure with hypercapnia (HCC) Active Problems:   Hypothyroidism   Schizoaffective disorder, depressive type (HCC)   Essential hypertension   Tobacco abuse   GERD (gastroesophageal reflux disease)   COPD exacerbation (HCC)   Normocytic anemia   Abnormal LFTs   Restless legs syndrome (RLS)  Hospital Course: PMH of breast cancer SP radiation, schizoaffective disorder, HLD, HTN, hypothyroidism, COPD and chronic respiratory failure on 4 LPM at home, mood disorder with suicide attempt history, GERD presented to hospital complains of shortness of breath. Assessment and Plan  Acute on chronic respiratory failure with hypercapnia (HCC) COPD exacerbation (HCC) Continue supplemental oxygen. On home oxygen Does not require BiPAP Was on Solu-Medrol and now tapering to prednisone combination. Continue nebulizer therapy. COVID, influenza, respiratory virus pathogen panel negative.   Restless legs syndrome (RLS) Continue ropinirole 2 mg p.o. bedtime.   Hypothyroidism Continue levothyroxine 137 mcg p.o. daily.   Schizoaffective disorder, depressive type (HCC) Continue clonazepam 0.5 mg p.o. twice daily. Continue hydroxyzine 25 mg p.o. 3 times daily. Continue Remeron 15 mg p.o. bedtime. Continue sertraline 100 mg p.o. daily.   Essential hypertension Continue furosemide 20 mg p.o. daily. KCl 20 mill equivalents p.o. twice daily. Continue lisinopril 5 mg p.o. daily. Monitor BP, renal function and electrolytes.   Tobacco abuse Tobacco cessation advised. Continue nicotine replacement therapy.   GERD (gastroesophageal reflux disease) Continue  pantoprazole 40 mg p.o. daily.   Normocytic anemia Monitor hematocrit and hemoglobin.   Abnormal LFTs Monitor for now.   Obesity Body mass index is 32.37 kg/m.  Placing the pt at higher risk of poor outcomes.   Consultants:  none  Procedures performed:  none  DISCHARGE MEDICATION: Allergies as of 11/18/2022       Reactions   Budesonide Shortness Of Breath   Promethazine Nausea And Vomiting   Lisinopril Other (See Comments)   "Dizzy spells"- Patient reports that this medication causes dizziness and she can "take it in small doses, like 5 mg."   Lithium Other (See Comments)   "Cannot function" on this        Medication List     STOP taking these medications    amLODipine 10 MG tablet Commonly known as: NORVASC   amoxicillin-clavulanate 875-125 MG tablet Commonly known as: AUGMENTIN   hydrOXYzine 25 MG capsule Commonly known as: VISTARIL   ibuprofen 200 MG tablet Commonly known as: ADVIL   lisinopril 5 MG tablet Commonly known as: ZESTRIL   mupirocin cream 2 % Commonly known as: BACTROBAN   sertraline 100 MG tablet Commonly known as: ZOLOFT       TAKE these medications    Advair HFA 230-21 MCG/ACT inhaler Generic drug: fluticasone-salmeterol Inhale 2 puffs into the lungs 2 (two) times daily.   albuterol 108 (90 Base) MCG/ACT inhaler Commonly known as: VENTOLIN HFA Inhale 2 puffs into the lungs every 4 (four) hours as needed for wheezing or shortness of breath.   atorvastatin 20 MG tablet Commonly known as: LIPITOR Take 1 tablet (20 mg total) by mouth at bedtime.   clonazePAM 0.5 MG tablet Commonly known as: KLONOPIN Take 0.5 mg by mouth See admin instructions. Take 0.5 mg by mouth around 2  PM daily and at bedtime   cyclobenzaprine 10 MG tablet Commonly known as: FLEXERIL Take 10 mg by mouth 3 (three) times daily as needed for muscle spasms.   dextromethorphan-guaiFENesin 30-600 MG 12hr tablet Commonly known as: MUCINEX DM Take 1  tablet by mouth 2 (two) times daily as needed for cough (if plain Mucinex or Mucinex-D are not being taken).   diclofenac 75 MG EC tablet Commonly known as: VOLTAREN Take 75 mg by mouth in the morning and at bedtime.   fluticasone 50 MCG/ACT nasal spray Commonly known as: FLONASE Place 1 spray into both nostrils daily.   furosemide 20 MG tablet Commonly known as: LASIX Take 40 mg by mouth in the morning.   gabapentin 600 MG tablet Commonly known as: NEURONTIN Take 600 mg by mouth 3 (three) times daily.   hydrOXYzine 25 MG tablet Commonly known as: ATARAX Take 25 mg by mouth 4 (four) times daily.   ipratropium 0.02 % nebulizer solution Commonly known as: ATROVENT Take 2.5 mLs (0.5 mg total) by nebulization every 6 (six) hours as needed for wheezing or shortness of breath. What changed:  when to take this reasons to take this   levalbuterol 0.63 MG/3ML nebulizer solution Commonly known as: XOPENEX Take 3 mLs (0.63 mg total) by nebulization every 6 (six) hours as needed for wheezing or shortness of breath. What changed: when to take this   metoprolol tartrate 25 MG tablet Commonly known as: LOPRESSOR Take 0.5 tablets (12.5 mg total) by mouth 2 (two) times daily.   MiraLax 17 GM/SCOOP powder Generic drug: polyethylene glycol powder Take 17 g by mouth every other day.   mirtazapine 30 MG tablet Commonly known as: REMERON Take 30 mg by mouth at bedtime. What changed: Another medication with the same name was removed. Continue taking this medication, and follow the directions you see here.   Mucinex 600 MG 12 hr tablet Generic drug: guaiFENesin Take 600 mg by mouth 2 (two) times daily as needed for cough or to loosen phlegm (if Mucinex DM or Mucinex-D are not being taken).   multivitamin with minerals Tabs tablet Take 1 tablet by mouth daily with breakfast.   mupirocin ointment 2 % Commonly known as: BACTROBAN Place 1 Application into the nose See admin instructions.  Place into the nostrils 1-2 times a day   nicotine 21 mg/24hr patch Commonly known as: NICODERM CQ - dosed in mg/24 hours Place 21 mg onto the skin daily.   OXYGEN Inhale 4 L/min into the lungs continuous.   pantoprazole 40 MG tablet Commonly known as: PROTONIX Take 40 mg by mouth daily before breakfast.   potassium chloride SA 20 MEQ tablet Commonly known as: KLOR-CON M Take 20 mEq by mouth 2 (two) times daily.   predniSONE 10 MG tablet Commonly known as: DELTASONE Take '40mg'$  daily for 3days,Take '30mg'$  daily for 3days,Take '20mg'$  daily for 3days,Take '10mg'$  daily for 3days, then stop What changed:  medication strength how much to take how to take this when to take this additional instructions   rOPINIRole 1 MG tablet Commonly known as: REQUIP Take 2 mg by mouth at bedtime.   Synthroid 137 MCG tablet Generic drug: levothyroxine Take 137 mcg by mouth daily before breakfast. What changed: Another medication with the same name was removed. Continue taking this medication, and follow the directions you see here.       Disposition: Home Diet recommendation: Cardiac diet  Discharge Exam: Vitals:   11/18/22 1034 11/18/22 1156 11/18/22 1321 11/18/22  1507  BP: (!) 153/78  127/72   Pulse: 99  87   Resp:  19 20   Temp:   (!) 97.5 F (36.4 C)   TempSrc:   Oral   SpO2: 100% 100% 95% 95%  Weight:      Height:       General: Appear in mild distress; no visible Abnormal Neck Mass Or lumps, Conjunctiva normal Cardiovascular: S1 and S2 Present, no Murmur, Respiratory: good respiratory effort, Bilateral Air entry present and no Crackles, Occasional  wheezes Abdomen: Bowel Sound present, Non tender  Extremities: no Pedal edema Neurology: alert and oriented to time, place, and person  [facility] Weights   11/16/22 0904  Weight: 80.3 kg   Condition at discharge: stable  The results of significant diagnostics from this hospitalization (including imaging, microbiology, ancillary and  laboratory) are listed below for reference.   Imaging Studies: DG Chest Port 1 View  Result Date: 11/21/2022 CLINICAL DATA:  COPD exacerbation. EXAM: PORTABLE CHEST 1 VIEW COMPARISON:  11/19/2022 FINDINGS: Lungs are hyperinflated. Heart size is normal. There is perihilar peribronchial thickening. No focal consolidations or pleural effusions. IMPRESSION: Hyperinflation and bronchitic changes. No focal infiltrate. Electronically Signed   By: Nolon Nations M.D.   On: 11/21/2022 10:54   DG Chest Port 1 View  Result Date: 11/19/2022 CLINICAL DATA:  Shortness of breath, history of COPD. EXAM: PORTABLE CHEST 1 VIEW COMPARISON:  Chest x-ray November 16, 2022 FINDINGS: The cardiomediastinal silhouette is unchanged in contour. Hyperinflated lungs, consistent with COPD. Minimal right basilar bandlike opacities, likely atelectasis. No additional focal pulmonary opacity. No pleural effusion or pneumothorax. The visualized upper abdomen is unremarkable. No acute osseous abnormality. IMPRESSION: 1. Right basilar bandlike opacities, likely atelectasis. 2. Hyperinflated lungs, consistent with history of COPD. Electronically Signed   By: Beryle Flock M.D.   On: 11/19/2022 16:58   ECHOCARDIOGRAM COMPLETE  Result Date: 11/17/2022    ECHOCARDIOGRAM REPORT   Patient Name:   KAYLEANN MCCAFFERY Date of Exam: 11/17/2022 Medical Rec #:  [id number]       Height:       62.0 in Accession #:    [id number]      Weight:       177.0 lb Date of Birth:  1956-06-09       BSA:          1.815 m Patient Age:    66 years        BP:           154/80 mmHg Patient Gender: F               HR:           85 bpm. Exam Location:  Inpatient Procedure: 2D Echo Indications:    abnormal ECG  History:        Patient has no prior history of Echocardiogram examinations.                 COPD; Risk Factors:Hypertension.  Sonographer:    Harvie Junior Referring Phys: [id number] [place]  Sonographer Comments: Technically difficult study due to poor  echo windows and patient is obese. Image acquisition challenging due to patient body habitus. IMPRESSIONS  1. Left ventricular ejection fraction, by estimation, is 55 to 60%. The left ventricle has normal function. The left ventricle has no regional wall motion abnormalities. There is mild concentric left ventricular hypertrophy. Left ventricular diastolic parameters are consistent with Grade I diastolic dysfunction (impaired relaxation).  2.  Right ventricular systolic function is normal. The right ventricular size is normal.  3. The mitral valve is normal in structure. No evidence of mitral valve regurgitation. No evidence of mitral stenosis.  4. The aortic valve is tricuspid. Aortic valve regurgitation is trivial. Aortic valve sclerosis is present, with no evidence of aortic valve stenosis. Aortic regurgitation PHT measures 550 msec. Aortic valve area, by VTI measures 3.36 cm. Aortic valve mean gradient measures 3.0 mmHg. Aortic valve Vmax measures 1.18 m/s.  5. The inferior vena cava is normal in size with greater than 50% respiratory variability, suggesting right atrial pressure of 3 mmHg. FINDINGS  Left Ventricle: Left ventricular ejection fraction, by estimation, is 55 to 60%. The left ventricle has normal function. The left ventricle has no regional wall motion abnormalities. The left ventricular internal cavity size was normal in size. There is  mild concentric left ventricular hypertrophy. Left ventricular diastolic parameters are consistent with Grade I diastolic dysfunction (impaired relaxation). Normal left ventricular filling pressure. Right Ventricle: The right ventricular size is normal. No increase in right ventricular wall thickness. Right ventricular systolic function is normal. Left Atrium: Left atrial size was normal in size. Right Atrium: Right atrial size was normal in size. Pericardium: There is no evidence of pericardial effusion. Mitral Valve: The mitral valve is normal in structure. No  evidence of mitral valve regurgitation. No evidence of mitral valve stenosis. Tricuspid Valve: The tricuspid valve is normal in structure. Tricuspid valve regurgitation is not demonstrated. No evidence of tricuspid stenosis. Aortic Valve: The aortic valve is tricuspid. Aortic valve regurgitation is trivial. Aortic regurgitation PHT measures 550 msec. Aortic valve sclerosis is present, with no evidence of aortic valve stenosis. Aortic valve mean gradient measures 3.0 mmHg. Aortic valve peak gradient measures 5.6 mmHg. Aortic valve area, by VTI measures 3.36 cm. Pulmonic Valve: The pulmonic valve was normal in structure. Pulmonic valve regurgitation is not visualized. No evidence of pulmonic stenosis. Aorta: The aortic root is normal in size and structure. Venous: The inferior vena cava is normal in size with greater than 50% respiratory variability, suggesting right atrial pressure of 3 mmHg. IAS/Shunts: No atrial level shunt detected by color flow Doppler.  LEFT VENTRICLE PLAX 2D LVIDd:         3.90 cm     Diastology LVIDs:         2.50 cm     LV e' medial:    6.09 cm/s LV PW:         1.10 cm     LV E/e' medial:  10.2 LV IVS:        1.10 cm     LV e' lateral:   6.85 cm/s LVOT diam:     2.10 cm     LV E/e' lateral: 9.1 LV SV:         68 LV SV Index:   37 LVOT Area:     3.46 cm  LV Volumes (MOD) LV vol d, MOD A2C: 72.4 ml LV vol d, MOD A4C: 83.9 ml LV vol s, MOD A2C: 33.3 ml LV vol s, MOD A4C: 34.5 ml LV SV MOD A2C:     39.1 ml LV SV MOD A4C:     83.9 ml LV SV MOD BP:      44.3 ml RIGHT VENTRICLE RV Basal diam:  3.00 cm RV Mid diam:    2.50 cm TAPSE (M-mode): 2.0 cm LEFT ATRIUM           Index  RIGHT ATRIUM          Index LA diam:      3.20 cm 1.76 cm/m   RA Area:     8.66 cm LA Vol (A4C): 41.6 ml 22.92 ml/m  RA Volume:   14.00 ml 7.71 ml/m  AORTIC VALVE                    PULMONIC VALVE AV Area (Vmax):    3.32 cm     PV Vmax:       1.19 m/s AV Area (Vmean):   3.14 cm     PV Peak grad:  5.7 mmHg AV Area  (VTI):     3.36 cm AV Vmax:           118.00 cm/s AV Vmean:          82.600 cm/s AV VTI:            0.202 m AV Peak Grad:      5.6 mmHg AV Mean Grad:      3.0 mmHg LVOT Vmax:         113.00 cm/s LVOT Vmean:        74.800 cm/s LVOT VTI:          0.196 m LVOT/AV VTI ratio: 0.97 AI PHT:            550 msec  AORTA Ao Root diam: 3.00 cm Ao Asc diam:  3.00 cm MITRAL VALVE MV Area (PHT): 3.85 cm    SHUNTS MV Decel Time: 197 msec    Systemic VTI:  0.20 m MR Peak grad: 30.1 mmHg    Systemic Diam: 2.10 cm MR Vmax:      274.50 cm/s MV E velocity: 62.10 cm/s MV A velocity: 94.70 cm/s MV E/A ratio:  0.66 Fransico Him MD Electronically signed by Fransico Him MD Signature Date/Time: 11/17/2022/9:47:35 AM    Final    DG Chest Portable 1 View  Result Date: 11/16/2022 CLINICAL DATA:  Dyspnea.  COPD EXAM: PORTABLE CHEST 1 VIEW COMPARISON:  Chest 11/12/2022 FINDINGS: COPD with hyperinflation of the lungs. Heart size upper normal. Negative for heart failure. Lungs clear without infiltrate or effusion. IMPRESSION: COPD. No acute abnormality. Electronically Signed   By: Franchot Gallo M.D.   On: 11/16/2022 09:39   DG Chest Port 1 View  Result Date: 11/12/2022 CLINICAL DATA:  Five day history of shortness of breath EXAM: PORTABLE CHEST 1 VIEW COMPARISON:  Chest radiograph dated 11/06/2022 FINDINGS: The patient is rotated slightly to the left. Normal lung volumes. No focal consolidations. No pleural effusion or pneumothorax. Similar mildly enlarged cardiomediastinal silhouette. The visualized skeletal structures are unremarkable. IMPRESSION: No active disease. Electronically Signed   By: Darrin Nipper M.D.   On: 11/12/2022 16:43   DG Chest Port 1 View  Result Date: 11/06/2022 CLINICAL DATA:  Shortness of breath EXAM: PORTABLE CHEST 1 VIEW COMPARISON:  07/15/2022 FINDINGS: Heart is normal size. Aortic calcifications. Hyperinflation compatible with COPD. No confluent airspace opacities or effusions. No acute bony abnormality.  IMPRESSION: Mild hyperinflation/COPD. No active disease. Electronically Signed   By: Rolm Baptise M.D.   On: 11/06/2022 19:42    Microbiology: Results for orders placed or performed during the hospital encounter of 11/16/22  Respiratory (~20 pathogens) panel by PCR     Status: None   Collection Time: 11/17/22  7:34 AM   Specimen: Nasopharyngeal Swab; Respiratory  Result Value Ref Range Status   Adenovirus NOT DETECTED NOT DETECTED  Final   Coronavirus 229E NOT DETECTED NOT DETECTED Final    Comment: (NOTE) The Coronavirus on the Respiratory Panel, DOES NOT test for the novel  Coronavirus (2019 nCoV)    Coronavirus HKU1 NOT DETECTED NOT DETECTED Final   Coronavirus NL63 NOT DETECTED NOT DETECTED Final   Coronavirus OC43 NOT DETECTED NOT DETECTED Final   Metapneumovirus NOT DETECTED NOT DETECTED Final   Rhinovirus / Enterovirus NOT DETECTED NOT DETECTED Final   Influenza A NOT DETECTED NOT DETECTED Final   Influenza B NOT DETECTED NOT DETECTED Final   Parainfluenza Virus 1 NOT DETECTED NOT DETECTED Final   Parainfluenza Virus 2 NOT DETECTED NOT DETECTED Final   Parainfluenza Virus 3 NOT DETECTED NOT DETECTED Final   Parainfluenza Virus 4 NOT DETECTED NOT DETECTED Final   Respiratory Syncytial Virus NOT DETECTED NOT DETECTED Final   Bordetella pertussis NOT DETECTED NOT DETECTED Final   Bordetella Parapertussis NOT DETECTED NOT DETECTED Final   Chlamydophila pneumoniae NOT DETECTED NOT DETECTED Final   Mycoplasma pneumoniae NOT DETECTED NOT DETECTED Final    Comment: Performed at [facility], [place] [address] [place], [place] [address]   Labs: CBC: Recent Labs  Lab 11/16/22 0914 11/17/22 0449 11/19/22 1618 11/21/22 1123 11/22/22 0755  WBC 13.3* 13.5* 13.4* 13.1* 12.5*  NEUTROABS 9.7*  --  12.3* 11.9*  --   HGB 10.5* 10.7* 11.5* 11.0* 10.5*  HCT 33.3* 33.0* 36.1 34.7* 33.0*  MCV 96.2 93.5 95.0 96.4 95.4  PLT 238 235 224 214 182   Basic Metabolic Panel: Recent Labs   Lab 11/16/22 0914 11/17/22 0449 11/19/22 1618 11/21/22 1123 11/21/22 1828 11/22/22 0755  NA 142 139 142 142  --  141  K 3.7 3.8 3.7 4.1  --  3.5  CL 98 98 98 99  --  96*  CO2 34* 35* 36* 34*  --  38*  GLUCOSE 174* 148* 264* 296*  --  117*  BUN 29* 26* 32* 41*  --  29*  CREATININE 0.90 0.70 0.88 1.02*  --  0.68  CALCIUM 9.4 8.9 9.2 9.2  --  8.6*  PHOS  --   --   --   --  3.8  --    Liver Function Tests: Recent Labs  Lab 11/16/22 0914 11/17/22 0449 11/21/22 1828 11/22/22 0755  AST 33 15 20 13*  ALT 48* 40 43 34  ALKPHOS 53 48 56 39  BILITOT 0.5 0.5 0.4 0.4  PROT 6.0* 5.7* 6.5 5.8*  ALBUMIN 3.5 3.3* 3.8 3.2*   CBG: Recent Labs  Lab 11/21/22 1644 11/21/22 2051 11/22/22 0737 11/22/22 1129 11/22/22 1606  GLUCAP 316* 249* 110* 225* 156*    Discharge time spent: greater than 30 minutes.  Signed: Berle Mull, MD [facility] 11/18/2022

## 2022-11-22 NOTE — Evaluation (Signed)
Physical Therapy Evaluation Patient Details Name: Audrey Meyer MRN: [id number] DOB: Jan 07, 1956 Today's Date: 11/22/2022  History of Present Illness  66 y.o. female with medical history significant of GERD, breast cancer, history of radiation therapy, drug overdose, suicide attempt, schizoaffective disorder, hyperlipidemia, hypertension, hyponatremia, hypothyroidism, COPD with chronic respiratory failure on home oxygen who has presented for the third time in the last 10 days to the emergency department due to dyspnea.  Pt with recent admission 12/13-12/17/2023 for Acute on chronic respiratory failure with hypercapnia due to COPD exacerbation, now returned to ED with c/o increased SOB and dyspnea .  Clinical Impression  Pt very pleasant and stated she is still getting short of breath with little activity. When asked which activities are the hardest for her, it sounds like bending down and then back up for laundry and ADLS are challenging. Discussed a little bit energy conservation and small bouts of activity. Doing some seated tasks instead of bending down. I feel like HHPT/HHOT could do more in home assessment however pt declined the idea at this time.   Reviewed and performed exercises: sit to stand without using arms 10 xs, standing heel and toe raises 10x, and pursed lip breathing after her walk. O2 sats on 4 liters remained above 98% the entire session. Pt became slightly short of breath and shaky with limited walking.  No further acute PT needs at this time, will sign off and have mobility specialist work with pt while here.        Recommendations for follow up therapy are one component of a multi-disciplinary discharge planning process, led by the attending physician.  Recommendations may be updated based on patient status, additional functional criteria and insurance authorization.  Follow Up Recommendations Home health PT      Assistance Recommended at Discharge PRN  Patient can return  home with the following  Assistance with cooking/housework;Assist for transportation    Equipment Recommendations None recommended by PT  Recommendations for Other Services       Functional Status Assessment Patient has not had a recent decline in their functional status     Precautions / Restrictions Precautions Precautions: Other (comment) Precaution Comments: baseline 4L O2 [place] Restrictions Weight Bearing Restrictions: No      Mobility  Bed Mobility Overal bed mobility: Modified Independent                  Transfers Overall transfer level: Modified independent Equipment used: None Transfers: Sit to/from Stand Sit to Stand: Supervision                Ambulation/Gait Ambulation/Gait assistance: Min guard Gait Distance (Feet): 80 Feet   Gait Pattern/deviations: Step-through pattern, Decreased stride length Gait velocity: decr     General Gait Details: decreased trunk and arm movement, ambulated on 4L O2 [place] and SPO2 97% upon returning to room. With this distance pt became fatigued and slightly shakey, . Could not have tolerated much longer walk today. Educated on short intervals of activity.  Stairs            Wheelchair Mobility    Modified Rankin (Stroke Patients Only)       Balance                                             Pertinent Vitals/Pain Pain Assessment Pain Assessment: No/denies pain  Home Living Family/patient expects to be discharged to:: Private residence Living Arrangements: Alone Available Help at Discharge: Family;Available PRN/intermittently Type of Home: Apartment Home Access: Stairs to enter   Entrance Stairs-Number of Steps: 1   Home Layout: One level Home Equipment: Cane - single point;Other (comment) Additional Comments: O 2 concentrator    Prior Function               Mobility Comments: independent, on 4L O2 [place] baseline ADLs Comments: family helps with errands     Hand  Dominance        Extremity/Trunk Assessment        Lower Extremity Assessment Lower Extremity Assessment: Overall WFL for tasks assessed       Communication   Communication: No difficulties  Cognition Arousal/Alertness: Awake/alert Behavior During Therapy: WFL for tasks assessed/performed Overall Cognitive Status: Within Functional Limits for tasks assessed                                 General Comments: Oriented x4, able to follow commands without difficulty        General Comments      Exercises     Assessment/Plan    PT Assessment Patient does not need any further PT services  PT Problem List         PT Treatment Interventions      PT Goals (Current goals can be found in the Care Plan section)  Acute Rehab PT Goals PT Goal Formulation: All assessment and education complete, DC therapy    Frequency       Co-evaluation               AM-PAC PT "6 Clicks" Mobility  Outcome Measure Help needed turning from your back to your side while in a flat bed without using bedrails?: None Help needed moving from lying on your back to sitting on the side of a flat bed without using bedrails?: None Help needed moving to and from a bed to a chair (including a wheelchair)?: None Help needed standing up from a chair using your arms (e.g., wheelchair or bedside chair)?: None Help needed to walk in hospital room?: A Little Help needed climbing 3-5 steps with a railing? : A Little 6 Click Score: 22    End of Session Equipment Utilized During Treatment: Oxygen Activity Tolerance: Patient tolerated treatment well Patient left: in bed;with call bell/phone within reach Nurse Communication: Mobility status PT Visit Diagnosis: Difficulty in walking, not elsewhere classified (R26.2)    Time: 1520-1540 PT Time Calculation (min) (ACUTE ONLY): 20 min   Charges:   PT Evaluation $PT Eval Low Complexity: 1 Low          Dimple Bastyr, PT, MPT Acute  Rehabilitation Services Office: [phone] If a weekend: [facility] w/e pager [phone] 11/22/2022   Marella Bile 11/22/2022, 4:41 PM

## 2022-11-22 NOTE — Progress Notes (Signed)
PROGRESS NOTE    Audrey Meyer  OEV:[id number] DOB: 09-09-1956 DOA: 11/21/2022 PCP: Ronald Pippins, NP    Brief Narrative:  66 year old with history of GERD, schizoaffective disorder, hyperlipidemia, hypertension and chronic hyponatremia, hypothyroidism, COPD with chronic hypoxemia on 4 L oxygen at home, ongoing smoker who was recently admitted to the hospital for COPD exacerbation went home continued to wheeze, continue to smoke and comes back with ongoing wheezing and shortness of breath.  At the emergency room hemodynamically stable.  100% on 4 L.  Due to persistent wheezing and not relieved with nebulizers admitted.  COVID-19 and influenza negative.  Chest x-ray with hyperinflation and bronchitic changes.  No acute findings.   Assessment & Plan:   COPD with chronic hypoxemia COPD with acute exacerbation, noncompliant to medications. Agree with admission because of severity of symptoms. Oral steroids, inhalational steroids, scheduled and as needed bronchodilators, deep breathing exercises, incentive spirometry, mobilize with physical therapy. Supplemental oxygen to keep saturations more than 90%. Consult Education officer, museum, probably her hospitalization is related to poor social support system.  Consult palliative care team, will explore benefits of home hospice for her COPD symptoms.  Chronic medical issues including Schizoaffective disorder, depressive type.  On multiple medication including clonazepam, hydroxyzine, Remeron and sertraline continued. Essential hypertension: On Lasix and potassium, lisinopril.  Continued. Hypothyroidism: On Synthroid.  Continued. GERD: On PPI.  Continue. Restless leg syndrome: On Requip.  Continued.  Smoker: Counseled to quit.  Less likely to quit.  Nicotine patch.   DVT prophylaxis: SCDs Start: 11/21/22 1352   Code Status: Full code Family Communication: None at the bedside Disposition Plan: Status is: Observation The patient will require care  spanning > 2 midnights and should be moved to inpatient because: Significant wheezing and COPD symptoms.  Needs inpatient monitoring, nebulizer therapies.     Consultants:  Palliative care  Procedures:  None  Antimicrobials:  None   Subjective: Patient seen in the morning rounds.  Flat affect.  She tells me that she never gets her breathing better.  She tells me that she went to her ex-husband's house and supposed to stay there for 3 months but he goes to work.  Patient tells me she continues to smoke and trying to quit.  Denies any fever or chills.  Denies any nausea vomiting.  Objective: Vitals:   11/21/22 1943 11/21/22 2049 11/22/22 0424 11/22/22 0822  BP:  (!) 145/70 (!) 151/66   Pulse:  87 69   Resp:  19 20   Temp:  97.7 F (36.5 C) 98.2 F (36.8 C)   TempSrc:      SpO2: 96% 100% 100% 99%  Weight:      Height:        Intake/Output Summary (Last 24 hours) at 11/22/2022 1115 Last data filed at 11/21/2022 1231 Gross per 24 hour  Intake 50 ml  Output --  Net 50 ml   Filed Weights   11/21/22 1016  Weight: 80.3 kg    Examination:  General exam: Appears calm and comfortable at rest. She is alert oriented.  Flat affect.  Not very forthcoming for conversation. Respiratory system: Extensive bilateral expiratory wheezes.  Not in any distress.  Not taking deep breaths in. Cardiovascular system: S1 & S2 heard, RRR. No pedal edema. Gastrointestinal system: Abdomen is nondistended, soft and nontender. No organomegaly or masses felt. Normal bowel sounds heard. Central nervous system: Alert and oriented. No focal neurological deficits.    Data Reviewed: I have personally reviewed following labs and  imaging studies  CBC: Recent Labs  Lab 11/16/22 0914 11/17/22 0449 11/19/22 1618 11/21/22 1123 11/22/22 0755  WBC 13.3* 13.5* 13.4* 13.1* 12.5*  NEUTROABS 9.7*  --  12.3* 11.9*  --   HGB 10.5* 10.7* 11.5* 11.0* 10.5*  HCT 33.3* 33.0* 36.1 34.7* 33.0*  MCV 96.2 93.5  95.0 96.4 95.4  PLT 238 235 224 214 195   Basic Metabolic Panel: Recent Labs  Lab 11/16/22 0914 11/17/22 0449 11/19/22 1618 11/21/22 1123 11/21/22 1828 11/22/22 0755  NA 142 139 142 142  --  141  K 3.7 3.8 3.7 4.1  --  3.5  CL 98 98 98 99  --  96*  CO2 34* 35* 36* 34*  --  38*  GLUCOSE 174* 148* 264* 296*  --  117*  BUN 29* 26* 32* 41*  --  29*  CREATININE 0.90 0.70 0.88 1.02*  --  0.68  CALCIUM 9.4 8.9 9.2 9.2  --  8.6*  PHOS  --   --   --   --  3.8  --    GFR: Estimated Creatinine Clearance: 66.1 mL/min (by C-G formula based on SCr of 0.68 mg/dL). Liver Function Tests: Recent Labs  Lab 11/16/22 0914 11/17/22 0449 11/21/22 1828 11/22/22 0755  AST 33 15 20 13*  ALT 48* 40 43 34  ALKPHOS 53 48 56 39  BILITOT 0.5 0.5 0.4 0.4  PROT 6.0* 5.7* 6.5 5.8*  ALBUMIN 3.5 3.3* 3.8 3.2*   No results for input(s): "LIPASE", "AMYLASE" in the last 168 hours. No results for input(s): "AMMONIA" in the last 168 hours. Coagulation Profile: No results for input(s): "INR", "PROTIME" in the last 168 hours. Cardiac Enzymes: No results for input(s): "CKTOTAL", "CKMB", "CKMBINDEX", "TROPONINI" in the last 168 hours. BNP (last 3 results) No results for input(s): "PROBNP" in the last 8760 hours. HbA1C: No results for input(s): "HGBA1C" in the last 72 hours. CBG: Recent Labs  Lab 11/16/22 0854 11/21/22 1644 11/21/22 2051 11/22/22 0737  GLUCAP 184* 316* 249* 110*   Lipid Profile: No results for input(s): "CHOL", "HDL", "LDLCALC", "TRIG", "CHOLHDL", "LDLDIRECT" in the last 72 hours. Thyroid Function Tests: No results for input(s): "TSH", "T4TOTAL", "FREET4", "T3FREE", "THYROIDAB" in the last 72 hours. Anemia Panel: No results for input(s): "VITAMINB12", "FOLATE", "FERRITIN", "TIBC", "IRON", "RETICCTPCT" in the last 72 hours. Sepsis Labs: No results for input(s): "PROCALCITON", "LATICACIDVEN" in the last 168 hours.  Recent Results (from the past 240 hour(s))  Resp panel by RT-PCR  (RSV, Flu A&B, Covid) Anterior Nasal Swab     Status: None   Collection Time: 11/12/22  4:25 PM   Specimen: Anterior Nasal Swab  Result Value Ref Range Status   SARS Coronavirus 2 by RT PCR NEGATIVE NEGATIVE Final    Comment: (NOTE) SARS-CoV-2 target nucleic acids are NOT DETECTED.  The SARS-CoV-2 RNA is generally detectable in upper respiratory specimens during the acute phase of infection. The lowest concentration of SARS-CoV-2 viral copies this assay can detect is 138 copies/mL. A negative result does not preclude SARS-Cov-2 infection and should not be used as the sole basis for treatment or other patient management decisions. A negative result may occur with  improper specimen collection/handling, submission of specimen other than nasopharyngeal swab, presence of viral mutation(s) within the areas targeted by this assay, and inadequate number of viral copies(<138 copies/mL). A negative result must be combined with clinical observations, patient history, and epidemiological information. The expected result is Negative.  Fact Sheet for Patients:  EntrepreneurPulse.com.au  Fact Sheet for Healthcare Providers:  IncredibleEmployment.be  This test is no t yet approved or cleared by the [place] FDA and  has been authorized for detection and/or diagnosis of SARS-CoV-2 by FDA under an Emergency Use Authorization (EUA). This EUA will remain  in effect (meaning this test can be used) for the duration of the COVID-19 declaration under Section 564(b)(1) of the Act, 21 U.S.C.section 360bbb-3(b)(1), unless the authorization is terminated  or revoked sooner.       Influenza A by PCR NEGATIVE NEGATIVE Final   Influenza B by PCR NEGATIVE NEGATIVE Final    Comment: (NOTE) The Xpert Xpress SARS-CoV-2/FLU/RSV plus assay is intended as an aid in the diagnosis of influenza from Nasopharyngeal swab specimens and should not be used as a sole basis for  treatment. Nasal washings and aspirates are unacceptable for Xpert Xpress SARS-CoV-2/FLU/RSV testing.  Fact Sheet for Patients: EntrepreneurPulse.com.au  Fact Sheet for Healthcare Providers: IncredibleEmployment.be  This test is not yet approved or cleared by the [place] FDA and has been authorized for detection and/or diagnosis of SARS-CoV-2 by FDA under an Emergency Use Authorization (EUA). This EUA will remain in effect (meaning this test can be used) for the duration of the COVID-19 declaration under Section 564(b)(1) of the Act, 21 U.S.C. section 360bbb-3(b)(1), unless the authorization is terminated or revoked.     Resp Syncytial Virus by PCR NEGATIVE NEGATIVE Final    Comment: (NOTE) Fact Sheet for Patients: EntrepreneurPulse.com.au  Fact Sheet for Healthcare Providers: IncredibleEmployment.be  This test is not yet approved or cleared by the [place] FDA and has been authorized for detection and/or diagnosis of SARS-CoV-2 by FDA under an Emergency Use Authorization (EUA). This EUA will remain in effect (meaning this test can be used) for the duration of the COVID-19 declaration under Section 564(b)(1) of the Act, 21 U.S.C. section 360bbb-3(b)(1), unless the authorization is terminated or revoked.  Performed at [facility], [place] [address] [place], [place] [address]   Respiratory (~20 pathogens) panel by PCR     Status: None   Collection Time: 11/17/22  7:34 AM   Specimen: Nasopharyngeal Swab; Respiratory  Result Value Ref Range Status   Adenovirus NOT DETECTED NOT DETECTED Final   Coronavirus 229E NOT DETECTED NOT DETECTED Final    Comment: (NOTE) The Coronavirus on the Respiratory Panel, DOES NOT test for the novel  Coronavirus (2019 nCoV)    Coronavirus HKU1 NOT DETECTED NOT DETECTED Final   Coronavirus NL63 NOT DETECTED NOT DETECTED Final   Coronavirus OC43  NOT DETECTED NOT DETECTED Final   Metapneumovirus NOT DETECTED NOT DETECTED Final   Rhinovirus / Enterovirus NOT DETECTED NOT DETECTED Final   Influenza A NOT DETECTED NOT DETECTED Final   Influenza B NOT DETECTED NOT DETECTED Final   Parainfluenza Virus 1 NOT DETECTED NOT DETECTED Final   Parainfluenza Virus 2 NOT DETECTED NOT DETECTED Final   Parainfluenza Virus 3 NOT DETECTED NOT DETECTED Final   Parainfluenza Virus 4 NOT DETECTED NOT DETECTED Final   Respiratory Syncytial Virus NOT DETECTED NOT DETECTED Final   Bordetella pertussis NOT DETECTED NOT DETECTED Final   Bordetella Parapertussis NOT DETECTED NOT DETECTED Final   Chlamydophila pneumoniae NOT DETECTED NOT DETECTED Final   Mycoplasma pneumoniae NOT DETECTED NOT DETECTED Final    Comment: Performed at [facility], [place]. [address], [place], [place] [address]  Resp panel by RT-PCR (RSV, Flu A&B, Covid) Anterior Nasal Swab     Status: None  Collection Time: 11/19/22  4:36 PM   Specimen: Anterior Nasal Swab  Result Value Ref Range Status   SARS Coronavirus 2 by RT PCR NEGATIVE NEGATIVE Final    Comment: (NOTE) SARS-CoV-2 target nucleic acids are NOT DETECTED.  The SARS-CoV-2 RNA is generally detectable in upper respiratory specimens during the acute phase of infection. The lowest concentration of SARS-CoV-2 viral copies this assay can detect is 138 copies/mL. A negative result does not preclude SARS-Cov-2 infection and should not be used as the sole basis for treatment or other patient management decisions. A negative result may occur with  improper specimen collection/handling, submission of specimen other than nasopharyngeal swab, presence of viral mutation(s) within the areas targeted by this assay, and inadequate number of viral copies(<138 copies/mL). A negative result must be combined with clinical observations, patient history, and epidemiological information. The expected result is Negative.  Fact Sheet for  Patients:  EntrepreneurPulse.com.au  Fact Sheet for Healthcare Providers:  IncredibleEmployment.be  This test is no t yet approved or cleared by the [place] FDA and  has been authorized for detection and/or diagnosis of SARS-CoV-2 by FDA under an Emergency Use Authorization (EUA). This EUA will remain  in effect (meaning this test can be used) for the duration of the COVID-19 declaration under Section 564(b)(1) of the Act, 21 U.S.C.section 360bbb-3(b)(1), unless the authorization is terminated  or revoked sooner.       Influenza A by PCR NEGATIVE NEGATIVE Final   Influenza B by PCR NEGATIVE NEGATIVE Final    Comment: (NOTE) The Xpert Xpress SARS-CoV-2/FLU/RSV plus assay is intended as an aid in the diagnosis of influenza from Nasopharyngeal swab specimens and should not be used as a sole basis for treatment. Nasal washings and aspirates are unacceptable for Xpert Xpress SARS-CoV-2/FLU/RSV testing.  Fact Sheet for Patients: EntrepreneurPulse.com.au  Fact Sheet for Healthcare Providers: IncredibleEmployment.be  This test is not yet approved or cleared by the [place] FDA and has been authorized for detection and/or diagnosis of SARS-CoV-2 by FDA under an Emergency Use Authorization (EUA). This EUA will remain in effect (meaning this test can be used) for the duration of the COVID-19 declaration under Section 564(b)(1) of the Act, 21 U.S.C. section 360bbb-3(b)(1), unless the authorization is terminated or revoked.     Resp Syncytial Virus by PCR NEGATIVE NEGATIVE Final    Comment: (NOTE) Fact Sheet for Patients: EntrepreneurPulse.com.au  Fact Sheet for Healthcare Providers: IncredibleEmployment.be  This test is not yet approved or cleared by the [place] FDA and has been authorized for detection and/or diagnosis of SARS-CoV-2 by FDA under an Emergency  Use Authorization (EUA). This EUA will remain in effect (meaning this test can be used) for the duration of the COVID-19 declaration under Section 564(b)(1) of the Act, 21 U.S.C. section 360bbb-3(b)(1), unless the authorization is terminated or revoked.  Performed at [facility], [place] [address], [place], [place] [address]   Resp panel by RT-PCR (RSV, Flu A&B, Covid) Anterior Nasal Swab     Status: None   Collection Time: 11/21/22 10:55 AM   Specimen: Anterior Nasal Swab  Result Value Ref Range Status   SARS Coronavirus 2 by RT PCR NEGATIVE NEGATIVE Final    Comment: (NOTE) SARS-CoV-2 target nucleic acids are NOT DETECTED.  The SARS-CoV-2 RNA is generally detectable in upper respiratory specimens during the acute phase of infection. The lowest concentration of SARS-CoV-2 viral copies this assay can detect is 138 copies/mL. A negative result does not preclude SARS-Cov-2 infection  and should not be used as the sole basis for treatment or other patient management decisions. A negative result may occur with  improper specimen collection/handling, submission of specimen other than nasopharyngeal swab, presence of viral mutation(s) within the areas targeted by this assay, and inadequate number of viral copies(<138 copies/mL). A negative result must be combined with clinical observations, patient history, and epidemiological information. The expected result is Negative.  Fact Sheet for Patients:  EntrepreneurPulse.com.au  Fact Sheet for Healthcare Providers:  IncredibleEmployment.be  This test is no t yet approved or cleared by the [place] FDA and  has been authorized for detection and/or diagnosis of SARS-CoV-2 by FDA under an Emergency Use Authorization (EUA). This EUA will remain  in effect (meaning this test can be used) for the duration of the COVID-19 declaration under Section 564(b)(1) of the Act, 21 U.S.C.section  360bbb-3(b)(1), unless the authorization is terminated  or revoked sooner.       Influenza A by PCR NEGATIVE NEGATIVE Final   Influenza B by PCR NEGATIVE NEGATIVE Final    Comment: (NOTE) The Xpert Xpress SARS-CoV-2/FLU/RSV plus assay is intended as an aid in the diagnosis of influenza from Nasopharyngeal swab specimens and should not be used as a sole basis for treatment. Nasal washings and aspirates are unacceptable for Xpert Xpress SARS-CoV-2/FLU/RSV testing.  Fact Sheet for Patients: EntrepreneurPulse.com.au  Fact Sheet for Healthcare Providers: IncredibleEmployment.be  This test is not yet approved or cleared by the [place] FDA and has been authorized for detection and/or diagnosis of SARS-CoV-2 by FDA under an Emergency Use Authorization (EUA). This EUA will remain in effect (meaning this test can be used) for the duration of the COVID-19 declaration under Section 564(b)(1) of the Act, 21 U.S.C. section 360bbb-3(b)(1), unless the authorization is terminated or revoked.     Resp Syncytial Virus by PCR NEGATIVE NEGATIVE Final    Comment: (NOTE) Fact Sheet for Patients: EntrepreneurPulse.com.au  Fact Sheet for Healthcare Providers: IncredibleEmployment.be  This test is not yet approved or cleared by the [place] FDA and has been authorized for detection and/or diagnosis of SARS-CoV-2 by FDA under an Emergency Use Authorization (EUA). This EUA will remain in effect (meaning this test can be used) for the duration of the COVID-19 declaration under Section 564(b)(1) of the Act, 21 U.S.C. section 360bbb-3(b)(1), unless the authorization is terminated or revoked.  Performed at [facility], [place] [address], [place], [place] [address]          Radiology Studies: [facility] Chest Port 1 View  Result Date: 11/21/2022 CLINICAL DATA:  COPD exacerbation. EXAM: PORTABLE CHEST 1  VIEW COMPARISON:  11/19/2022 FINDINGS: Lungs are hyperinflated. Heart size is normal. There is perihilar peribronchial thickening. No focal consolidations or pleural effusions. IMPRESSION: Hyperinflation and bronchitic changes. No focal infiltrate. Electronically Signed   By: Nolon Nations M.D.   On: 11/21/2022 10:54        Scheduled Meds:  clonazePAM  0.5 mg Oral BID   dextromethorphan-guaiFENesin  1 tablet Oral BID   furosemide  20 mg Oral Daily   gabapentin  600 mg Oral TID   hydrOXYzine  25 mg Oral QID   insulin aspart  0-20 Units Subcutaneous TID WC   ipratropium-albuterol  3 mL Nebulization QID   levothyroxine  137 mcg Oral QAC breakfast   metoprolol tartrate  12.5 mg Oral BID   mirtazapine  30 mg Oral QHS   nicotine  21 mg Transdermal Daily   pantoprazole  40 mg  Oral QAC breakfast   potassium chloride SA  20 mEq Oral BID   predniSONE  40 mg Oral Q breakfast   rOPINIRole  2 mg Oral QHS   Continuous Infusions:   LOS: 0 days    Time spent: 35 minutes    Barb Merino, MD [facility] Pager [phone]

## 2022-11-23 DIAGNOSIS — J441 Chronic obstructive pulmonary disease with (acute) exacerbation: Secondary | ICD-10-CM | POA: Diagnosis not present

## 2022-11-23 LAB — HEMOGLOBIN A1C
Hgb A1c MFr Bld: 6.7 % — ABNORMAL HIGH (ref 4.8–5.6)
Hgb A1c MFr Bld: 6.7 % — ABNORMAL HIGH (ref 4.8–5.6)
Mean Plasma Glucose: 146 mg/dL
Mean Plasma Glucose: 146 mg/dL

## 2022-11-23 LAB — GLUCOSE, CAPILLARY
Glucose-Capillary: 87 mg/dL (ref 70–99)
Glucose-Capillary: 166 mg/dL — ABNORMAL HIGH (ref 70–99)
Glucose-Capillary: 152 mg/dL — ABNORMAL HIGH (ref 70–99)

## 2022-11-23 MED ORDER — SALINE SPRAY 0.65 % NA SOLN
1.0000 | NASAL | Status: DC | PRN
  Administered 2022-11-23: 1 via NASAL
  Filled 2022-11-23: qty 44

## 2022-11-23 NOTE — Evaluation (Signed)
Occupational Therapy Evaluation Patient Details Name: Audrey Meyer MRN: [id number] DOB: October 18, 1956 Today's Date: 11/23/2022   History of Present Illness 66 y.o. female with medical history significant of GERD, breast cancer, history of radiation therapy, drug overdose, suicide attempt, schizoaffective disorder, hyperlipidemia, hypertension, hyponatremia, hypothyroidism, COPD with chronic respiratory failure on home oxygen who has presented for the third time in the last 10 days to the emergency department due to dyspnea.  Pt with recent admission 12/13-12/17/2023 for Acute on chronic respiratory failure with hypercapnia due to COPD exacerbation, now returned to ED with c/o increased SOB and dyspnea .   Clinical Impression   Patient evaluated by Occupational Therapy with no further acute OT needs identified. All education has been completed and the patient has no further questions. Patient appears to be at baseline with ADLs at this time. Patient was provided with ECT education.  See below for any follow-up Occupational Therapy or equipment needs. OT is signing off. Thank you for this referral.       Recommendations for follow up therapy are one component of a multi-disciplinary discharge planning process, led by the attending physician.  Recommendations may be updated based on patient status, additional functional criteria and insurance authorization.   Follow Up Recommendations  No OT follow up     Assistance Recommended at Discharge PRN     Functional Status Assessment  Patient has not had a recent decline in their functional status  Equipment Recommendations  None recommended by OT       Precautions / Restrictions Precautions Precautions: Other (comment) Precaution Comments: baseline 4L O2 [place] Restrictions Weight Bearing Restrictions: No      Mobility Bed Mobility Overal bed mobility: Modified Independent      Transfers Overall transfer level: Modified independent           Balance Overall balance assessment: No apparent balance deficits (not formally assessed)           ADL either performed or assessed with clinical judgement   ADL Overall ADL's : At baseline         General ADL Comments: [place] was able to complete toileting tasks with [facility] with MI and no physical A. patient was educated on ECT for home with ADLs. patient endorsed using some of mentioned techniques at home already. patient was provided with hand out     Vision Patient Visual Report: No change from baseline           Hand Dominance Right   Extremity/Trunk Assessment Upper Extremity Assessment Upper Extremity Assessment: Overall WFL for tasks assessed   Lower Extremity Assessment Lower Extremity Assessment: Defer to PT evaluation   Cervical / Trunk Assessment Cervical / Trunk Assessment: Normal   Communication Communication Communication: No difficulties   Cognition Arousal/Alertness: Awake/alert Behavior During Therapy: WFL for tasks assessed/performed Overall Cognitive Status: Within Functional Limits for tasks assessed                      Home Living Family/patient expects to be discharged to:: Private residence Living Arrangements: Alone Available Help at Discharge: Family;Available PRN/intermittently Type of Home: Apartment Home Access: Stairs to enter     Home Layout: One level     Bathroom Shower/Tub: Tub/shower unit         Home Equipment: Cane - single point;Other (comment)   Additional Comments: O 2 concentrator      Prior Functioning/Environment Prior Level of Function : Independent/Modified Independent  Mobility Comments: independent, on 4L O2 [place] baseline ADLs Comments: family helps with errands                 OT Goals(Current goals can be found in the care plan section) Acute Rehab OT Goals OT Goal Formulation: All assessment and education complete, DC therapy  OT Frequency:         AM-PAC  OT "6 Clicks" Daily Activity     Outcome Measure Help from another person eating meals?: None Help from another person taking care of personal grooming?: None Help from another person toileting, which includes using toliet, bedpan, or urinal?: None Help from another person bathing (including washing, rinsing, drying)?: None Help from another person to put on and taking off regular upper body clothing?: None Help from another person to put on and taking off regular lower body clothing?: None 6 Click Score: 24   End of Session Nurse Communication: Mobility status  Activity Tolerance: Patient tolerated treatment well Patient left: in bed;with call bell/phone within reach  OT Visit Diagnosis: Muscle weakness (generalized) (M62.81)                Time: 8416-6063 OT Time Calculation (min): 10 min Charges:  OT General Charges $OT Visit: 1 Visit OT Evaluation $OT Eval Low Complexity: 1 Low  Loyde Orth OTR/L, MS Acute Rehabilitation Department Office# [phone]   Selinda Flavin 11/23/2022, 1:04 PM

## 2022-11-23 NOTE — Progress Notes (Signed)
Palliative:  Note plans for discharge today. Chart reviewed and not hospice appropriate at this time. Would benefit from connection with outpatient pulmonology for improved management of COPD. Recommend outpatient palliative consultation to address goals of care.  No charge  Vinie Sill, NP Palliative Medicine Team Pager [phone] (Please see [email or web address] for schedule) Team Phone [phone]

## 2022-11-23 NOTE — TOC Transition Note (Signed)
Transition of Care [facility]) - CM/SW Discharge Note   Patient Details  Name: Audrey Meyer MRN: [id number] Date of Birth: 09-25-1956  Transition of Care [facility] CM/SW Contact:  Henrietta Dine, RN Phone Number: 11/23/2022, 2:40 PM   Clinical Narrative:    Notified by Denny Peon, RN the pt's dtr-in-law called and says she will pick the pt up at 530 pm; no taxi voucher needed;Leah also says the pt's oxygen has been delivered to room; no TOC needs.   Final next level of care: Home/Self Care Barriers to Discharge: No Barriers Identified   Patient Goals and CMS Choice Patient states their goals for this hospitalization and ongoing recovery are:: home        Discharge Placement                       Discharge Plan and Services   Discharge Planning Services: CM Consult              DME Agency: [employer] Date DME Agency Contacted: 11/23/22 Time DME Agency Contacted: [id number] Representative spoke with at DME Agency: Caryl Pina            Social Determinants of Health ([place]) Interventions     Readmission Risk Interventions     No data to display

## 2022-11-23 NOTE — Discharge Summary (Signed)
Physician Discharge Summary  Audrey Meyer YQM:[id number] DOB: [birth year] DOA: 11/21/2022  PCP: Audrey Pippins, NP  Admit date: 11/21/2022 Discharge date: 11/23/2022  Admitted From: Home Disposition: Home with family  Recommendations for Outpatient Follow-up:  Follow up with PCP in 1-2 weeks   Home Health: PT offered.  Declined. Equipment/Devices: Oxygen available at home  Discharge Condition: Fair CODE STATUS: Full code Diet recommendation: Low-salt diet  Discharge summary: 66 year old with history of GERD, schizoaffective disorder, hyperlipidemia, hypertension and chronic hyponatremia, hypothyroidism, COPD with chronic hypoxemia on 4 L oxygen at home, ongoing smoker who was recently admitted to the hospital for COPD exacerbation went home continued to wheeze, continue to smoke and comes back with ongoing wheezing and shortness of breath.  At the emergency room hemodynamically stable.  100% on 4 L.  Due to persistent wheezing and not relieved with nebulizers admitted.  COVID-19 and influenza negative.  Chest x-ray with hyperinflation and bronchitic changes.  No acute findings.     Assessment & Plan:   COPD with chronic hypoxemia COPD with acute exacerbation, noncompliant to medications. Treated with Oral steroids, inhalational steroids, scheduled and as needed bronchodilators, deep breathing exercises, incentive spirometry, mobilize with physical therapy. Supplemental oxygen to keep saturations more than 90%. Adequately stabilized.  On her usual self. She had poor living condition with her ex-husband, likely cause for readmission.  Discharging back to her home with children. Optimized on therapy.  Continue and complete the prednisone taper.   Chronic medical issues including Schizoaffective disorder, depressive type.  On multiple medication including clonazepam, hydroxyzine, Remeron and sertraline continued. Essential hypertension: On Lasix and potassium, lisinopril.   Continued. Hypothyroidism: On Synthroid.  Continued. GERD: On PPI.  Continue. Restless leg syndrome: On Requip.  Continued.   Smoker: Counseled to quit.  Not very receptive.  Stable for discharge with family.    Discharge Diagnoses:  Principal Problem:   COPD with acute exacerbation ([place]) Active Problems:   Hypothyroidism   Schizoaffective disorder, depressive type ([place])   Essential hypertension   Tobacco abuse   GERD (gastroesophageal reflux disease)   Chronic respiratory failure with hypoxia, on home O2 therapy ([facility]) - 4 L/min   Normocytic anemia   Restless legs syndrome (RLS)   Hyperglycemia    Discharge Instructions  Discharge Instructions     Diet - low sodium heart healthy   Complete by: As directed    Increase activity slowly   Complete by: As directed       Allergies as of 11/23/2022       Reactions   Budesonide Shortness Of Breath   Promethazine Nausea And Vomiting   Lisinopril Other (See Comments)   "Dizzy spells"- Patient reports that this medication causes dizziness and she can "take it in small doses, like 5 mg."   Lithium Other (See Comments)   "Cannot function" on this        Medication List     TAKE these medications    Advair HFA 230-21 MCG/ACT inhaler Generic drug: fluticasone-salmeterol Inhale 2 puffs into the lungs 2 (two) times daily.   albuterol 108 (90 Base) MCG/ACT inhaler Commonly known as: VENTOLIN HFA Inhale 2 puffs into the lungs every 4 (four) hours as needed for wheezing or shortness of breath.   atorvastatin 20 MG tablet Commonly known as: LIPITOR Take 1 tablet (20 mg total) by mouth at bedtime.   clonazePAM 0.5 MG tablet Commonly known as: KLONOPIN Take 0.5 mg by mouth See admin instructions. Take 0.5 mg by mouth  around 2 PM daily and at bedtime   cyclobenzaprine 10 MG tablet Commonly known as: FLEXERIL Take 10 mg by mouth 3 (three) times daily as needed for muscle spasms.   dextromethorphan-guaiFENesin 30-600  MG 12hr tablet Commonly known as: MUCINEX DM Take 1 tablet by mouth 2 (two) times daily as needed for cough (if plain Mucinex or Mucinex-D are not being taken).   diclofenac 75 MG EC tablet Commonly known as: VOLTAREN Take 75 mg by mouth in the morning and at bedtime.   fluticasone 50 MCG/ACT nasal spray Commonly known as: FLONASE Place 1 spray into both nostrils daily.   furosemide 20 MG tablet Commonly known as: LASIX Take 40 mg by mouth in the morning.   gabapentin 600 MG tablet Commonly known as: NEURONTIN Take 600 mg by mouth 3 (three) times daily.   hydrOXYzine 25 MG tablet Commonly known as: ATARAX Take 25 mg by mouth 4 (four) times daily.   ipratropium 0.02 % nebulizer solution Commonly known as: ATROVENT Take 2.5 mLs (0.5 mg total) by nebulization every 6 (six) hours as needed for wheezing or shortness of breath.   levalbuterol 0.63 MG/3ML nebulizer solution Commonly known as: XOPENEX Take 3 mLs (0.63 mg total) by nebulization every 6 (six) hours as needed for wheezing or shortness of breath.   lisinopril 5 MG tablet Commonly known as: ZESTRIL Take 5 mg by mouth daily as needed (if Systolic number is 329 or greater).   metoprolol tartrate 25 MG tablet Commonly known as: LOPRESSOR Take 0.5 tablets (12.5 mg total) by mouth 2 (two) times daily.   MiraLax 17 GM/SCOOP powder Generic drug: polyethylene glycol powder Take 17 g by mouth every other day.   mirtazapine 30 MG tablet Commonly known as: REMERON Take 30 mg by mouth at bedtime.   Mucinex 600 MG 12 hr tablet Generic drug: guaiFENesin Take 600 mg by mouth 2 (two) times daily as needed for cough or to loosen phlegm (if Mucinex DM or Mucinex-D are not being taken).   Mucinex D 60-600 MG 12 hr tablet Generic drug: pseudoephedrine-guaifenesin Take 1 tablet by mouth every 12 (twelve) hours as needed for congestion (if plain Mucinex or Mucinex DM are not being taken).   multivitamin with minerals Tabs  tablet Take 1 tablet by mouth daily with breakfast.   mupirocin ointment 2 % Commonly known as: BACTROBAN Place 1 Application into the nose See admin instructions. Place into the nostrils 1-2 times a day   nicotine 21 mg/24hr patch Commonly known as: NICODERM CQ - dosed in mg/24 hours Place 21 mg onto the skin daily.   OXYGEN Inhale 4 L/min into the lungs continuous.   pantoprazole 40 MG tablet Commonly known as: PROTONIX Take 40 mg by mouth daily before breakfast.   potassium chloride SA 20 MEQ tablet Commonly known as: KLOR-CON M Take 20 mEq by mouth 2 (two) times daily.   predniSONE 10 MG tablet Commonly known as: DELTASONE Take '40mg'$  daily for 3days,Take '30mg'$  daily for 3days,Take '20mg'$  daily for 3days,Take '10mg'$  daily for 3days, then stop What changed:  how much to take how to take this when to take this additional instructions   rOPINIRole 1 MG tablet Commonly known as: REQUIP Take 2 mg by mouth at bedtime.   Synthroid 137 MCG tablet Generic drug: levothyroxine Take 137 mcg by mouth daily before breakfast.        [place] Follow up.   Why: PLEASE Yolanda Bonine [phone]  WHEN YOU GET HOME SO THEY CAN SET UP TIME TO CHECK YOUR EQUIPMENT Contact information: [address] [place] [address] [phone]                Allergies  Allergen Reactions   Budesonide Shortness Of Breath   Promethazine Nausea And Vomiting   Lisinopril Other (See Comments)    "Dizzy spells"- Patient reports that this medication causes dizziness and she can "take it in small doses, like 5 mg."   Lithium Other (See Comments)    "Cannot function" on this    Consultations: None   Procedures/Studies: DG Chest Port 1 View  Result Date: 11/21/2022 CLINICAL DATA:  COPD exacerbation. EXAM: PORTABLE CHEST 1 VIEW COMPARISON:  11/19/2022 FINDINGS: Lungs are hyperinflated. Heart size is normal. There is perihilar peribronchial  thickening. No focal consolidations or pleural effusions. IMPRESSION: Hyperinflation and bronchitic changes. No focal infiltrate. Electronically Signed   By: Nolon Nations M.D.   On: 11/21/2022 10:54   DG Chest Port 1 View  Result Date: 11/19/2022 CLINICAL DATA:  Shortness of breath, history of COPD. EXAM: PORTABLE CHEST 1 VIEW COMPARISON:  Chest x-ray November 16, 2022 FINDINGS: The cardiomediastinal silhouette is unchanged in contour. Hyperinflated lungs, consistent with COPD. Minimal right basilar bandlike opacities, likely atelectasis. No additional focal pulmonary opacity. No pleural effusion or pneumothorax. The visualized upper abdomen is unremarkable. No acute osseous abnormality. IMPRESSION: 1. Right basilar bandlike opacities, likely atelectasis. 2. Hyperinflated lungs, consistent with history of COPD. Electronically Signed   By: Beryle Flock M.D.   On: 11/19/2022 16:58   ECHOCARDIOGRAM COMPLETE  Result Date: 11/17/2022    ECHOCARDIOGRAM REPORT   Patient Name:   JACKLINE CASTILLA Date of Exam: 11/17/2022 Medical Rec #:  [id number]       Height:       62.0 in Accession #:    [id number]      Weight:       177.0 lb Date of Birth:  [birth year]       BSA:          1.815 m Patient Age:    42 years        BP:           154/80 mmHg Patient Gender: F               HR:           85 bpm. Exam Location:  Inpatient Procedure: 2D Echo Indications:    abnormal ECG  History:        Patient has no prior history of Echocardiogram examinations.                 COPD; Risk Factors:Hypertension.  Sonographer:    Harvie Junior Referring Phys: [id number] [place]  Sonographer Comments: Technically difficult study due to poor echo windows and patient is obese. Image acquisition challenging due to patient body habitus. IMPRESSIONS  1. Left ventricular ejection fraction, by estimation, is 55 to 60%. The left ventricle has normal function. The left ventricle has no regional wall motion abnormalities. There is mild  concentric left ventricular hypertrophy. Left ventricular diastolic parameters are consistent with Grade I diastolic dysfunction (impaired relaxation).  2. Right ventricular systolic function is normal. The right ventricular size is normal.  3. The mitral valve is normal in structure. No evidence of mitral valve regurgitation. No evidence of mitral stenosis.  4. The aortic valve is tricuspid. Aortic valve regurgitation is trivial.  Aortic valve sclerosis is present, with no evidence of aortic valve stenosis. Aortic regurgitation PHT measures 550 msec. Aortic valve area, by VTI measures 3.36 cm. Aortic valve mean gradient measures 3.0 mmHg. Aortic valve Vmax measures 1.18 m/s.  5. The inferior vena cava is normal in size with greater than 50% respiratory variability, suggesting right atrial pressure of 3 mmHg. FINDINGS  Left Ventricle: Left ventricular ejection fraction, by estimation, is 55 to 60%. The left ventricle has normal function. The left ventricle has no regional wall motion abnormalities. The left ventricular internal cavity size was normal in size. There is  mild concentric left ventricular hypertrophy. Left ventricular diastolic parameters are consistent with Grade I diastolic dysfunction (impaired relaxation). Normal left ventricular filling pressure. Right Ventricle: The right ventricular size is normal. No increase in right ventricular wall thickness. Right ventricular systolic function is normal. Left Atrium: Left atrial size was normal in size. Right Atrium: Right atrial size was normal in size. Pericardium: There is no evidence of pericardial effusion. Mitral Valve: The mitral valve is normal in structure. No evidence of mitral valve regurgitation. No evidence of mitral valve stenosis. Tricuspid Valve: The tricuspid valve is normal in structure. Tricuspid valve regurgitation is not demonstrated. No evidence of tricuspid stenosis. Aortic Valve: The aortic valve is tricuspid. Aortic valve  regurgitation is trivial. Aortic regurgitation PHT measures 550 msec. Aortic valve sclerosis is present, with no evidence of aortic valve stenosis. Aortic valve mean gradient measures 3.0 mmHg. Aortic valve peak gradient measures 5.6 mmHg. Aortic valve area, by VTI measures 3.36 cm. Pulmonic Valve: The pulmonic valve was normal in structure. Pulmonic valve regurgitation is not visualized. No evidence of pulmonic stenosis. Aorta: The aortic root is normal in size and structure. Venous: The inferior vena cava is normal in size with greater than 50% respiratory variability, suggesting right atrial pressure of 3 mmHg. IAS/Shunts: No atrial level shunt detected by color flow Doppler.  LEFT VENTRICLE PLAX 2D LVIDd:         3.90 cm     Diastology LVIDs:         2.50 cm     LV e' medial:    6.09 cm/s LV PW:         1.10 cm     LV E/e' medial:  10.2 LV IVS:        1.10 cm     LV e' lateral:   6.85 cm/s LVOT diam:     2.10 cm     LV E/e' lateral: 9.1 LV SV:         68 LV SV Index:   37 LVOT Area:     3.46 cm  LV Volumes (MOD) LV vol d, MOD A2C: 72.4 ml LV vol d, MOD A4C: 83.9 ml LV vol s, MOD A2C: 33.3 ml LV vol s, MOD A4C: 34.5 ml LV SV MOD A2C:     39.1 ml LV SV MOD A4C:     83.9 ml LV SV MOD BP:      44.3 ml RIGHT VENTRICLE RV Basal diam:  3.00 cm RV Mid diam:    2.50 cm TAPSE (M-mode): 2.0 cm LEFT ATRIUM           Index        RIGHT ATRIUM          Index LA diam:      3.20 cm 1.76 cm/m   RA Area:     8.66 cm LA Vol (A4C): 41.6 ml 22.92  ml/m  RA Volume:   14.00 ml 7.71 ml/m  AORTIC VALVE                    PULMONIC VALVE AV Area (Vmax):    3.32 cm     PV Vmax:       1.19 m/s AV Area (Vmean):   3.14 cm     PV Peak grad:  5.7 mmHg AV Area (VTI):     3.36 cm AV Vmax:           118.00 cm/s AV Vmean:          82.600 cm/s AV VTI:            0.202 m AV Peak Grad:      5.6 mmHg AV Mean Grad:      3.0 mmHg LVOT Vmax:         113.00 cm/s LVOT Vmean:        74.800 cm/s LVOT VTI:          0.196 m LVOT/AV VTI ratio: 0.97 AI  PHT:            550 msec  AORTA Ao Root diam: 3.00 cm Ao Asc diam:  3.00 cm MITRAL VALVE MV Area (PHT): 3.85 cm    SHUNTS MV Decel Time: 197 msec    Systemic VTI:  0.20 m MR Peak grad: 30.1 mmHg    Systemic Diam: 2.10 cm MR Vmax:      274.50 cm/s MV E velocity: 62.10 cm/s MV A velocity: 94.70 cm/s MV E/A ratio:  0.66 Fransico Him MD Electronically signed by Fransico Him MD Signature Date/Time: 11/17/2022/9:47:35 AM    Final    DG Chest Portable 1 View  Result Date: 11/16/2022 CLINICAL DATA:  Dyspnea.  COPD EXAM: PORTABLE CHEST 1 VIEW COMPARISON:  Chest 11/12/2022 FINDINGS: COPD with hyperinflation of the lungs. Heart size upper normal. Negative for heart failure. Lungs clear without infiltrate or effusion. IMPRESSION: COPD. No acute abnormality. Electronically Signed   By: Franchot Gallo M.D.   On: 11/16/2022 09:39   DG Chest Port 1 View  Result Date: 11/12/2022 CLINICAL DATA:  Five day history of shortness of breath EXAM: PORTABLE CHEST 1 VIEW COMPARISON:  Chest radiograph dated 11/06/2022 FINDINGS: The patient is rotated slightly to the left. Normal lung volumes. No focal consolidations. No pleural effusion or pneumothorax. Similar mildly enlarged cardiomediastinal silhouette. The visualized skeletal structures are unremarkable. IMPRESSION: No active disease. Electronically Signed   By: Darrin Nipper M.D.   On: 11/12/2022 16:43   DG Chest Port 1 View  Result Date: 11/06/2022 CLINICAL DATA:  Shortness of breath EXAM: PORTABLE CHEST 1 VIEW COMPARISON:  07/15/2022 FINDINGS: Heart is normal size. Aortic calcifications. Hyperinflation compatible with COPD. No confluent airspace opacities or effusions. No acute bony abnormality. IMPRESSION: Mild hyperinflation/COPD. No active disease. Electronically Signed   By: Rolm Baptise M.D.   On: 11/06/2022 19:42   (Echo, Carotid, EGD, Colonoscopy, ERCP)    Subjective: Patient seen and examined.  No new complaints.  Patient tells me that she is able to go home and  needs to go to her ex-husband's house.  Called and discussed with patient's son, daughter-in-law and they requested not to discharge the patient except with children.  Patient comfortable going home with her daughter-in-law.   Discharge Exam: Vitals:   11/22/22 2106 11/23/22 0614  BP: 139/78 (!) 154/80  Pulse: 63 64  Resp: 18 18  Temp: 97.8 F (36.6 C)  97.8 F (36.6 C)  SpO2: 100% 100%   Vitals:   11/22/22 1608 11/22/22 1701 11/22/22 2106 11/23/22 0614  BP:   139/78 (!) 154/80  Pulse: 76  63 64  Resp:   18 18  Temp:   97.8 F (36.6 C) 97.8 F (36.6 C)  TempSrc:   Oral Oral  SpO2: 100% 97% 100% 100%  Weight:      Height:        General: Pt is alert, awake, not in acute distress On 4 L oxygen.  Looks comfortable.  Flat affect.  Alert oriented x 4. Cardiovascular: RRR, S1/S2 +, no rubs, no gallops Respiratory: CTA bilaterally, no wheezing, no rhonchi, no added sounds. Abdominal: Soft, NT, ND, bowel sounds + Extremities: no edema, no cyanosis    The results of significant diagnostics from this hospitalization (including imaging, microbiology, ancillary and laboratory) are listed below for reference.     Microbiology: Recent Results (from the past 240 hour(s))  Respiratory (~20 pathogens) panel by PCR     Status: None   Collection Time: 11/17/22  7:34 AM   Specimen: Nasopharyngeal Swab; Respiratory  Result Value Ref Range Status   Adenovirus NOT DETECTED NOT DETECTED Final   Coronavirus 229E NOT DETECTED NOT DETECTED Final    Comment: (NOTE) The Coronavirus on the Respiratory Panel, DOES NOT test for the novel  Coronavirus (2019 nCoV)    Coronavirus HKU1 NOT DETECTED NOT DETECTED Final   Coronavirus NL63 NOT DETECTED NOT DETECTED Final   Coronavirus OC43 NOT DETECTED NOT DETECTED Final   Metapneumovirus NOT DETECTED NOT DETECTED Final   Rhinovirus / Enterovirus NOT DETECTED NOT DETECTED Final   Influenza A NOT DETECTED NOT DETECTED Final   Influenza B NOT DETECTED  NOT DETECTED Final   Parainfluenza Virus 1 NOT DETECTED NOT DETECTED Final   Parainfluenza Virus 2 NOT DETECTED NOT DETECTED Final   Parainfluenza Virus 3 NOT DETECTED NOT DETECTED Final   Parainfluenza Virus 4 NOT DETECTED NOT DETECTED Final   Respiratory Syncytial Virus NOT DETECTED NOT DETECTED Final   Bordetella pertussis NOT DETECTED NOT DETECTED Final   Bordetella Parapertussis NOT DETECTED NOT DETECTED Final   Chlamydophila pneumoniae NOT DETECTED NOT DETECTED Final   Mycoplasma pneumoniae NOT DETECTED NOT DETECTED Final    Comment: Performed at [facility], [place]. [address] [place], [place] [address]  Resp panel by RT-PCR (RSV, Flu A&B, Covid) Anterior Nasal Swab     Status: None   Collection Time: 11/19/22  4:36 PM   Specimen: Anterior Nasal Swab  Result Value Ref Range Status   SARS Coronavirus 2 by RT PCR NEGATIVE NEGATIVE Final    Comment: (NOTE) SARS-CoV-2 target nucleic acids are NOT DETECTED.  The SARS-CoV-2 RNA is generally detectable in upper respiratory specimens during the acute phase of infection. The lowest concentration of SARS-CoV-2 viral copies this assay can detect is 138 copies/mL. A negative result does not preclude SARS-Cov-2 infection and should not be used as the sole basis for treatment or other patient management decisions. A negative result may occur with  improper specimen collection/handling, submission of specimen other than nasopharyngeal swab, presence of viral mutation(s) within the areas targeted by this assay, and inadequate number of viral copies(<138 copies/mL). A negative result must be combined with clinical observations, patient history, and epidemiological information. The expected result is Negative.  Fact Sheet for Patients:  EntrepreneurPulse.com.au  Fact Sheet for Healthcare Providers:  IncredibleEmployment.be  This test is no t yet approved or  cleared by the [place] and  has  been authorized for detection and/or diagnosis of SARS-CoV-2 by FDA under an Emergency Use Authorization (EUA). This EUA will remain  in effect (meaning this test can be used) for the duration of the COVID-19 declaration under Section 564(b)(1) of the Act, 21 U.S.C.section 360bbb-3(b)(1), unless the authorization is terminated  or revoked sooner.       Influenza A by PCR NEGATIVE NEGATIVE Final   Influenza B by PCR NEGATIVE NEGATIVE Final    Comment: (NOTE) The Xpert Xpress SARS-CoV-2/FLU/RSV plus assay is intended as an aid in the diagnosis of influenza from Nasopharyngeal swab specimens and should not be used as a sole basis for treatment. Nasal washings and aspirates are unacceptable for Xpert Xpress SARS-CoV-2/FLU/RSV testing.  Fact Sheet for Patients: EntrepreneurPulse.com.au  Fact Sheet for Healthcare Providers: IncredibleEmployment.be  This test is not yet approved or cleared by the [place] FDA and has been authorized for detection and/or diagnosis of SARS-CoV-2 by FDA under an Emergency Use Authorization (EUA). This EUA will remain in effect (meaning this test can be used) for the duration of the COVID-19 declaration under Section 564(b)(1) of the Act, 21 U.S.C. section 360bbb-3(b)(1), unless the authorization is terminated or revoked.     Resp Syncytial Virus by PCR NEGATIVE NEGATIVE Final    Comment: (NOTE) Fact Sheet for Patients: EntrepreneurPulse.com.au  Fact Sheet for Healthcare Providers: IncredibleEmployment.be  This test is not yet approved or cleared by the [place] FDA and has been authorized for detection and/or diagnosis of SARS-CoV-2 by FDA under an Emergency Use Authorization (EUA). This EUA will remain in effect (meaning this test can be used) for the duration of the COVID-19 declaration under Section 564(b)(1) of the Act, 21 U.S.C. section 360bbb-3(b)(1), unless the  authorization is terminated or revoked.  Performed at [facility], [place] [address] [place], [place] [address]   Resp panel by RT-PCR (RSV, Flu A&B, Covid) Anterior Nasal Swab     Status: None   Collection Time: 11/21/22 10:55 AM   Specimen: Anterior Nasal Swab  Result Value Ref Range Status   SARS Coronavirus 2 by RT PCR NEGATIVE NEGATIVE Final    Comment: (NOTE) SARS-CoV-2 target nucleic acids are NOT DETECTED.  The SARS-CoV-2 RNA is generally detectable in upper respiratory specimens during the acute phase of infection. The lowest concentration of SARS-CoV-2 viral copies this assay can detect is 138 copies/mL. A negative result does not preclude SARS-Cov-2 infection and should not be used as the sole basis for treatment or other patient management decisions. A negative result may occur with  improper specimen collection/handling, submission of specimen other than nasopharyngeal swab, presence of viral mutation(s) within the areas targeted by this assay, and inadequate number of viral copies(<138 copies/mL). A negative result must be combined with clinical observations, patient history, and epidemiological information. The expected result is Negative.  Fact Sheet for Patients:  EntrepreneurPulse.com.au  Fact Sheet for Healthcare Providers:  IncredibleEmployment.be  This test is no t yet approved or cleared by the [place] FDA and  has been authorized for detection and/or diagnosis of SARS-CoV-2 by FDA under an Emergency Use Authorization (EUA). This EUA will remain  in effect (meaning this test can be used) for the duration of the COVID-19 declaration under Section 564(b)(1) of the Act, 21 U.S.C.section 360bbb-3(b)(1), unless the authorization is terminated  or revoked sooner.       Influenza A by PCR NEGATIVE NEGATIVE Final   Influenza B  by PCR NEGATIVE NEGATIVE Final    Comment: (NOTE) The Xpert Xpress  SARS-CoV-2/FLU/RSV plus assay is intended as an aid in the diagnosis of influenza from Nasopharyngeal swab specimens and should not be used as a sole basis for treatment. Nasal washings and aspirates are unacceptable for Xpert Xpress SARS-CoV-2/FLU/RSV testing.  Fact Sheet for Patients: EntrepreneurPulse.com.au  Fact Sheet for Healthcare Providers: IncredibleEmployment.be  This test is not yet approved or cleared by the [place] FDA and has been authorized for detection and/or diagnosis of SARS-CoV-2 by FDA under an Emergency Use Authorization (EUA). This EUA will remain in effect (meaning this test can be used) for the duration of the COVID-19 declaration under Section 564(b)(1) of the Act, 21 U.S.C. section 360bbb-3(b)(1), unless the authorization is terminated or revoked.     Resp Syncytial Virus by PCR NEGATIVE NEGATIVE Final    Comment: (NOTE) Fact Sheet for Patients: EntrepreneurPulse.com.au  Fact Sheet for Healthcare Providers: IncredibleEmployment.be  This test is not yet approved or cleared by the [place] FDA and has been authorized for detection and/or diagnosis of SARS-CoV-2 by FDA under an Emergency Use Authorization (EUA). This EUA will remain in effect (meaning this test can be used) for the duration of the COVID-19 declaration under Section 564(b)(1) of the Act, 21 U.S.C. section 360bbb-3(b)(1), unless the authorization is terminated or revoked.  Performed at [facility], [place] [address], [place], [place] [address]      Labs: BNP (last 3 results) Recent Labs    12/07/21 0446 11/19/22 1618 11/21/22 1123  BNP 46.7 81.0 27.0   Basic Metabolic Panel: Recent Labs  Lab 11/17/22 0449 11/19/22 1618 11/21/22 1123 11/21/22 1828 11/22/22 0755  NA 139 142 142  --  141  K 3.8 3.7 4.1  --  3.5  CL 98 98 99  --  96*  CO2 35* 36* 34*  --  38*  GLUCOSE 148*  264* 296*  --  117*  BUN 26* 32* 41*  --  29*  CREATININE 0.70 0.88 1.02*  --  0.68  CALCIUM 8.9 9.2 9.2  --  8.6*  PHOS  --   --   --  3.8  --    Liver Function Tests: Recent Labs  Lab 11/17/22 0449 11/21/22 1828 11/22/22 0755  AST 15 20 13*  ALT 40 43 34  ALKPHOS 48 56 39  BILITOT 0.5 0.4 0.4  PROT 5.7* 6.5 5.8*  ALBUMIN 3.3* 3.8 3.2*   No results for input(s): "LIPASE", "AMYLASE" in the last 168 hours. No results for input(s): "AMMONIA" in the last 168 hours. CBC: Recent Labs  Lab 11/17/22 0449 11/19/22 1618 11/21/22 1123 11/22/22 0755  WBC 13.5* 13.4* 13.1* 12.5*  NEUTROABS  --  12.3* 11.9*  --   HGB 10.7* 11.5* 11.0* 10.5*  HCT 33.0* 36.1 34.7* 33.0*  MCV 93.5 95.0 96.4 95.4  PLT 235 224 214 200   Cardiac Enzymes: No results for input(s): "CKTOTAL", "CKMB", "CKMBINDEX", "TROPONINI" in the last 168 hours. BNP: Invalid input(s): "POCBNP" CBG: Recent Labs  Lab 11/22/22 0737 11/22/22 1129 11/22/22 1606 11/22/22 2112 11/23/22 0735  GLUCAP 110* 225* 156* 138* 87   D-Dimer No results for input(s): "DDIMER" in the last 72 hours. Hgb A1c Recent Labs    11/21/22 1828 11/22/22 0755  HGBA1C 6.7* 6.7*   Lipid Profile No results for input(s): "CHOL", "HDL", "LDLCALC", "TRIG", "CHOLHDL", "LDLDIRECT" in the last 72 hours. Thyroid function studies No results for input(s): "TSH", "T4TOTAL", "T3FREE", "THYROIDAB"  in the last 72 hours.  Invalid input(s): "FREET3" Anemia work up No results for input(s): "VITAMINB12", "FOLATE", "FERRITIN", "TIBC", "IRON", "RETICCTPCT" in the last 72 hours. Urinalysis    Component Value Date/Time   COLORURINE STRAW (A) 11/16/2021 0405   APPEARANCEUR CLEAR 11/16/2021 0405   LABSPEC 1.014 11/16/2021 0405   PHURINE 6.0 11/16/2021 0405   GLUCOSEU NEGATIVE 11/16/2021 0405   HGBUR NEGATIVE 11/16/2021 0405   BILIRUBINUR NEGATIVE 11/16/2021 0405   KETONESUR NEGATIVE 11/16/2021 0405   PROTEINUR NEGATIVE 11/16/2021 0405    UROBILINOGEN 0.2 09/19/2012 2334   NITRITE NEGATIVE 11/16/2021 0405   LEUKOCYTESUR NEGATIVE 11/16/2021 0405   Sepsis Labs Recent Labs  Lab 11/17/22 0449 11/19/22 1618 11/21/22 1123 11/22/22 0755  WBC 13.5* 13.4* 13.1* 12.5*   Microbiology Recent Results (from the past 240 hour(s))  Respiratory (~20 pathogens) panel by PCR     Status: None   Collection Time: 11/17/22  7:34 AM   Specimen: Nasopharyngeal Swab; Respiratory  Result Value Ref Range Status   Adenovirus NOT DETECTED NOT DETECTED Final   Coronavirus 229E NOT DETECTED NOT DETECTED Final    Comment: (NOTE) The Coronavirus on the Respiratory Panel, DOES NOT test for the novel  Coronavirus (2019 nCoV)    Coronavirus HKU1 NOT DETECTED NOT DETECTED Final   Coronavirus NL63 NOT DETECTED NOT DETECTED Final   Coronavirus OC43 NOT DETECTED NOT DETECTED Final   Metapneumovirus NOT DETECTED NOT DETECTED Final   Rhinovirus / Enterovirus NOT DETECTED NOT DETECTED Final   Influenza A NOT DETECTED NOT DETECTED Final   Influenza B NOT DETECTED NOT DETECTED Final   Parainfluenza Virus 1 NOT DETECTED NOT DETECTED Final   Parainfluenza Virus 2 NOT DETECTED NOT DETECTED Final   Parainfluenza Virus 3 NOT DETECTED NOT DETECTED Final   Parainfluenza Virus 4 NOT DETECTED NOT DETECTED Final   Respiratory Syncytial Virus NOT DETECTED NOT DETECTED Final   Bordetella pertussis NOT DETECTED NOT DETECTED Final   Bordetella Parapertussis NOT DETECTED NOT DETECTED Final   Chlamydophila pneumoniae NOT DETECTED NOT DETECTED Final   Mycoplasma pneumoniae NOT DETECTED NOT DETECTED Final    Comment: Performed at [facility], [place]. [address], [place],  [address]  Resp panel by RT-PCR (RSV, Flu A&B, Covid) Anterior Nasal Swab     Status: None   Collection Time: 11/19/22  4:36 PM   Specimen: Anterior Nasal Swab  Result Value Ref Range Status   SARS Coronavirus 2 by RT PCR NEGATIVE NEGATIVE Final    Comment: (NOTE) SARS-CoV-2 target  nucleic acids are NOT DETECTED.  The SARS-CoV-2 RNA is generally detectable in upper respiratory specimens during the acute phase of infection. The lowest concentration of SARS-CoV-2 viral copies this assay can detect is 138 copies/mL. A negative result does not preclude SARS-Cov-2 infection and should not be used as the sole basis for treatment or other patient management decisions. A negative result may occur with  improper specimen collection/handling, submission of specimen other than nasopharyngeal swab, presence of viral mutation(s) within the areas targeted by this assay, and inadequate number of viral copies(<138 copies/mL). A negative result must be combined with clinical observations, patient history, and epidemiological information. The expected result is Negative.  Fact Sheet for Patients:  EntrepreneurPulse.com.au  Fact Sheet for Healthcare Providers:  IncredibleEmployment.be  This test is no t yet approved or cleared by the [place] FDA and  has been authorized for detection and/or diagnosis of SARS-CoV-2 by FDA under an Emergency Use Authorization (EUA). This EUA  will remain  in effect (meaning this test can be used) for the duration of the COVID-19 declaration under Section 564(b)(1) of the Act, 21 U.S.C.section 360bbb-3(b)(1), unless the authorization is terminated  or revoked sooner.       Influenza A by PCR NEGATIVE NEGATIVE Final   Influenza B by PCR NEGATIVE NEGATIVE Final    Comment: (NOTE) The Xpert Xpress SARS-CoV-2/FLU/RSV plus assay is intended as an aid in the diagnosis of influenza from Nasopharyngeal swab specimens and should not be used as a sole basis for treatment. Nasal washings and aspirates are unacceptable for Xpert Xpress SARS-CoV-2/FLU/RSV testing.  Fact Sheet for Patients: EntrepreneurPulse.com.au  Fact Sheet for Healthcare  Providers: IncredibleEmployment.be  This test is not yet approved or cleared by the [place] FDA and has been authorized for detection and/or diagnosis of SARS-CoV-2 by FDA under an Emergency Use Authorization (EUA). This EUA will remain in effect (meaning this test can be used) for the duration of the COVID-19 declaration under Section 564(b)(1) of the Act, 21 U.S.C. section 360bbb-3(b)(1), unless the authorization is terminated or revoked.     Resp Syncytial Virus by PCR NEGATIVE NEGATIVE Final    Comment: (NOTE) Fact Sheet for Patients: EntrepreneurPulse.com.au  Fact Sheet for Healthcare Providers: IncredibleEmployment.be  This test is not yet approved or cleared by the [place] FDA and has been authorized for detection and/or diagnosis of SARS-CoV-2 by FDA under an Emergency Use Authorization (EUA). This EUA will remain in effect (meaning this test can be used) for the duration of the COVID-19 declaration under Section 564(b)(1) of the Act, 21 U.S.C. section 360bbb-3(b)(1), unless the authorization is terminated or revoked.  Performed at [facility], [place] [address], [place], [place] [address]   Resp panel by RT-PCR (RSV, Flu A&B, Covid) Anterior Nasal Swab     Status: None   Collection Time: 11/21/22 10:55 AM   Specimen: Anterior Nasal Swab  Result Value Ref Range Status   SARS Coronavirus 2 by RT PCR NEGATIVE NEGATIVE Final    Comment: (NOTE) SARS-CoV-2 target nucleic acids are NOT DETECTED.  The SARS-CoV-2 RNA is generally detectable in upper respiratory specimens during the acute phase of infection. The lowest concentration of SARS-CoV-2 viral copies this assay can detect is 138 copies/mL. A negative result does not preclude SARS-Cov-2 infection and should not be used as the sole basis for treatment or other patient management decisions. A negative result may occur with  improper  specimen collection/handling, submission of specimen other than nasopharyngeal swab, presence of viral mutation(s) within the areas targeted by this assay, and inadequate number of viral copies(<138 copies/mL). A negative result must be combined with clinical observations, patient history, and epidemiological information. The expected result is Negative.  Fact Sheet for Patients:  EntrepreneurPulse.com.au  Fact Sheet for Healthcare Providers:  IncredibleEmployment.be  This test is no t yet approved or cleared by the [place] FDA and  has been authorized for detection and/or diagnosis of SARS-CoV-2 by FDA under an Emergency Use Authorization (EUA). This EUA will remain  in effect (meaning this test can be used) for the duration of the COVID-19 declaration under Section 564(b)(1) of the Act, 21 U.S.C.section 360bbb-3(b)(1), unless the authorization is terminated  or revoked sooner.       Influenza A by PCR NEGATIVE NEGATIVE Final   Influenza B by PCR NEGATIVE NEGATIVE Final    Comment: (NOTE) The Xpert Xpress SARS-CoV-2/FLU/RSV plus assay is intended as an aid in the diagnosis of influenza from  Nasopharyngeal swab specimens and should not be used as a sole basis for treatment. Nasal washings and aspirates are unacceptable for Xpert Xpress SARS-CoV-2/FLU/RSV testing.  Fact Sheet for Patients: EntrepreneurPulse.com.au  Fact Sheet for Healthcare Providers: IncredibleEmployment.be  This test is not yet approved or cleared by the [place] FDA and has been authorized for detection and/or diagnosis of SARS-CoV-2 by FDA under an Emergency Use Authorization (EUA). This EUA will remain in effect (meaning this test can be used) for the duration of the COVID-19 declaration under Section 564(b)(1) of the Act, 21 U.S.C. section 360bbb-3(b)(1), unless the authorization is terminated or revoked.     Resp  Syncytial Virus by PCR NEGATIVE NEGATIVE Final    Comment: (NOTE) Fact Sheet for Patients: EntrepreneurPulse.com.au  Fact Sheet for Healthcare Providers: IncredibleEmployment.be  This test is not yet approved or cleared by the [place] FDA and has been authorized for detection and/or diagnosis of SARS-CoV-2 by FDA under an Emergency Use Authorization (EUA). This EUA will remain in effect (meaning this test can be used) for the duration of the COVID-19 declaration under Section 564(b)(1) of the Act, 21 U.S.C. section 360bbb-3(b)(1), unless the authorization is terminated or revoked.  Performed at [facility], [place] [address], [place], [place] [address]      Time coordinating discharge: 35 minutes  SIGNED:   Barb Merino, MD  [facility] 11/23/2022, 11:05 AM

## 2022-11-23 NOTE — Discharge Summary (Incomplete)
Pt expresses desire to discharge with her exhusband. Writer was called into room by pt at [facility], pt stated "my husband said I need to be dressed and that he is almost here."

## 2022-11-23 NOTE — Progress Notes (Signed)
Pt's daughter supposed to be here at 1700. Writer called Levada Dy to get an update on her ETA. Went directly to VM with full mailbox. Unable to leave VM. Will continue to monitor.

## 2022-11-23 NOTE — Progress Notes (Signed)
Per patient family pt is not to go to the ex husband's house. Daughter in law, Levada Dy will be picking pt up at 1730.

## 2022-11-23 NOTE — TOC Initial Note (Addendum)
Transition of Care [facility]) - Initial/Assessment Note    Patient Details  Name: Audrey Meyer MRN: [id number] Date of Birth: 1956-02-04  Transition of [facility]) CM/SW Contact:    Henrietta Dine, RN Phone Number: [phone] 11/23/2022, 10:48 AM  Clinical Narrative:                 TOC consult for HHPT; spoke w/ pt in room and she says she does not want HHPT; the pt says she has transportation the pt says [employer] provides her home oxygen; and her travel tank is full; the pt says she wears glasses and has dentures (upper and lower); she says she does not have [facility] services or any other DME; the pt says she feels safe going home; she denies IPV, food insecurity, or difficulty paying her utilities; notified Caryl Pina at [facility] of pt's d/c; he says the pt should call [employer] ([phone]) when she gets home so they can set up a time to check her equipment; pt notified; also contact information for [facility] placed in follow up provider section of d/c instructions; no TOC needs.   -1128- notified by pt she may need transportation home; she says she is unable to reach her son Shanon Brow [phone]); the pt also says she has to check w/ her ex-husband to see if he is still going to give her money for transport but he is at an MD appt; the pt says her dtr does not have transportation to pick her up; the pt says she does not have money for a cab; awaiting her response to see if she can contact her ex-husband  -1202- attempted to contact pt's son Shanon Brow x 2; LVMs at [phone].  -1247- attempted to contact pt's dtr Ronal Fear; [employer] message says "called party is unavailable"; unable to leave msg.  -1253- spoke w/ pt and she says Ronal Fear is her dtr-in-law; she says her dtr's name is also Levada Dy but she does not know her phone #; the pt also says her son has not returned her call, and she thinks her ex-husband called when she was eating; the pt says she will call him again.  -1303- pt  now asking for transport to her ex-husband's appt b/c she has to pick up her stuff and dog; she says her son told her last night that he can pick her up from there if she needed him to.  -1315- pt now asking for transport to her ex-husband's appt b/c she has to pick up her stuff and dog; the pt says she has a key she says her son told her last night that he can pick her up from there if she needed him to; she verifies her ex-husband's address [place], [place]; consulted w/ [facility] Supervisor Nancy Marus; she says the pt can be given a taxi voucher for transported there; contacted Caryl Pina at [facility] and they will bring a portable tank to room; pt notified and agrees to plan.  -1334- spoke w/ Velma at [facility]; she says the estimate for transport is $23.00; pt will be picked up from ED entrance; taxi voucher will be given to [facility], [place]; will call when pt is ready; Caryl Pina at [facility] says the oxygen will be delivered in app 30 minutes.        Patient Goals and CMS Choice            Expected Discharge Plan and Services  Prior Living Arrangements/Services                       Activities of Daily Living Home Assistive Devices/Equipment: Oxygen, Nebulizer ADL Screening (condition at time of admission) Patient's cognitive ability adequate to safely complete daily activities?: Yes Is the patient deaf or have difficulty hearing?: No Does the patient have difficulty seeing, even when wearing glasses/contacts?: No Does the patient have difficulty concentrating, remembering, or making decisions?: No Patient able to express need for assistance with ADLs?: No Does the patient have difficulty dressing or bathing?: No Independently performs ADLs?: Yes (appropriate for developmental age) Does the patient have difficulty walking or climbing stairs?: Yes Weakness of Legs: None Weakness of Arms/Hands: None  Permission  Sought/Granted                  Emotional Assessment              Admission diagnosis:  COPD exacerbation ([place]) [J44.1] COPD with acute exacerbation ([place]) [J44.1] Patient Active Problem List   Diagnosis Date Noted   Hyperglycemia 11/21/2022   Abnormal LFTs 11/16/2022   Restless legs syndrome (RLS) 11/16/2022   Acute on chronic respiratory failure with hypercapnia (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   Chronic obstructive pulmonary disease ([place])    Hypokalemia 03/07/2016   Hypothyroidism 03/07/2016   Schizoaffective disorder, depressive type ([place]) 03/07/2016   Essential hypertension 03/07/2016   Tobacco abuse 03/07/2016   PCP:  Ronald Pippins, NP Pharmacy:   [id number]Phone: [phone]: [phone]    Social Determinants of Health (SDOH) Social History: SDOH Screenings   Food Insecurity: No Food Insecurity (11/21/2022)  Housing: Low Risk  (11/21/2022)  Transportation Needs: Unmet Transportation Needs (11/21/2022)  Utilities: Not At Risk (11/21/2022)  Tobacco Use: High Risk (11/21/2022)   SDOH Interventions:     Readmission Risk Interventions     No data to display

## 2023-09-05 DEATH — deceased
